# Patient Record
Sex: Male | Born: 1960 | Smoking: Never smoker
Health system: Southern US, Community
[De-identification: ages and names within clinical notes are randomized; demographics above are authoritative.]

## PROBLEM LIST (undated history)

## (undated) DIAGNOSIS — I4891 Unspecified atrial fibrillation: Secondary | ICD-10-CM

## (undated) DIAGNOSIS — I82409 Acute embolism and thrombosis of unspecified deep veins of unspecified lower extremity: Secondary | ICD-10-CM

## (undated) DIAGNOSIS — I509 Heart failure, unspecified: Secondary | ICD-10-CM

## (undated) DIAGNOSIS — F17200 Nicotine dependence, unspecified, uncomplicated: Secondary | ICD-10-CM

## (undated) DIAGNOSIS — I739 Peripheral vascular disease, unspecified: Secondary | ICD-10-CM

## (undated) DIAGNOSIS — I50810 Right heart failure, unspecified: Secondary | ICD-10-CM

## (undated) DIAGNOSIS — I10 Essential (primary) hypertension: Secondary | ICD-10-CM

## (undated) DIAGNOSIS — J449 Chronic obstructive pulmonary disease, unspecified: Secondary | ICD-10-CM

## (undated) HISTORY — PX: ORIF FEMUR FRACTURE: SHX2119

## (undated) HISTORY — PX: KNEE ARTHROSCOPY: SUR90

---

## 2001-11-25 ENCOUNTER — Encounter: Payer: Self-pay | Admitting: *Deleted

## 2001-11-25 ENCOUNTER — Emergency Department (HOSPITAL_COMMUNITY): Admission: EM | Admit: 2001-11-25 | Discharge: 2001-11-25 | Payer: Self-pay | Admitting: *Deleted

## 2003-01-06 ENCOUNTER — Other Ambulatory Visit: Payer: Self-pay

## 2005-06-03 ENCOUNTER — Other Ambulatory Visit: Payer: Self-pay

## 2005-06-04 ENCOUNTER — Inpatient Hospital Stay: Payer: Self-pay | Admitting: Internal Medicine

## 2005-07-06 ENCOUNTER — Ambulatory Visit: Payer: Self-pay | Admitting: Internal Medicine

## 2005-07-24 ENCOUNTER — Emergency Department: Payer: Self-pay | Admitting: Emergency Medicine

## 2005-08-16 ENCOUNTER — Emergency Department: Payer: Self-pay | Admitting: Unknown Physician Specialty

## 2005-08-17 ENCOUNTER — Encounter: Payer: Self-pay | Admitting: Internal Medicine

## 2005-08-31 ENCOUNTER — Encounter: Payer: Self-pay | Admitting: Internal Medicine

## 2005-10-01 ENCOUNTER — Encounter: Payer: Self-pay | Admitting: Internal Medicine

## 2005-10-31 ENCOUNTER — Encounter: Payer: Self-pay | Admitting: Internal Medicine

## 2005-12-01 ENCOUNTER — Encounter: Payer: Self-pay | Admitting: Internal Medicine

## 2006-10-16 ENCOUNTER — Other Ambulatory Visit: Payer: Self-pay

## 2006-10-16 ENCOUNTER — Emergency Department: Payer: Self-pay | Admitting: Internal Medicine

## 2007-11-21 ENCOUNTER — Ambulatory Visit: Payer: Self-pay

## 2008-07-30 ENCOUNTER — Encounter: Payer: Self-pay | Admitting: Internal Medicine

## 2008-07-31 ENCOUNTER — Observation Stay: Payer: Self-pay | Admitting: Internal Medicine

## 2008-07-31 ENCOUNTER — Ambulatory Visit: Payer: Self-pay | Admitting: Internal Medicine

## 2009-07-07 ENCOUNTER — Inpatient Hospital Stay: Payer: Self-pay | Admitting: Internal Medicine

## 2010-05-26 ENCOUNTER — Emergency Department: Payer: Self-pay | Admitting: Emergency Medicine

## 2010-06-18 NOTE — Procedures (Signed)
   NAME:  Christopher Bryan, Christopher Bryan                        ACCOUNT NO.:  [id number]   MEDICAL RECORD NO.:  [id number]                   PATIENT TYPE:  EMS   LOCATION:  ED                                   FACILITY:  [facility]   PHYSICIAN:  Edward L. Juanetta Gosling, M.D.             DATE OF BIRTH:  05-12-1960   DATE OF PROCEDURE:  DATE OF DISCHARGE:  11/25/2001                                EKG INTERPRETATION   TIME AND DATE:  1520 on 11/25/01   INTERPRETATION:  The rhythm is sinus rhythm with a bradycardic rate in the  50s.  There is left atrial enlargement.  Q waves are seen inferiorly which  may indicate previous inferior myocardial infarction.  Clinical correlation  is suggested..  There are Q waves across the precordium which may indicate a  previous anterior myocardial infarction as well.   IMPRESSION:  Abnormal electrocardiogram.                                               Edward L. Juanetta Gosling, M.D.    ELH/MEDQ  D:  11/26/2001  T:  11/27/2001  Job:  [id number]

## 2016-06-21 ENCOUNTER — Emergency Department: Payer: Non-veteran care

## 2016-06-21 ENCOUNTER — Inpatient Hospital Stay
Admission: EM | Admit: 2016-06-21 | Discharge: 2016-06-23 | DRG: 189 | Disposition: A | Discharge status: Home / Self Care | Payer: Non-veteran care | Attending: Internal Medicine | Admitting: Internal Medicine

## 2016-06-21 ENCOUNTER — Encounter: Payer: Self-pay | Admitting: Emergency Medicine

## 2016-06-21 DIAGNOSIS — R0602 Shortness of breath: Secondary | ICD-10-CM | POA: Diagnosis present

## 2016-06-21 DIAGNOSIS — E872 Acidosis: Secondary | ICD-10-CM | POA: Diagnosis present

## 2016-06-21 DIAGNOSIS — Z8249 Family history of ischemic heart disease and other diseases of the circulatory system: Secondary | ICD-10-CM

## 2016-06-21 DIAGNOSIS — G629 Polyneuropathy, unspecified: Secondary | ICD-10-CM | POA: Diagnosis present

## 2016-06-21 DIAGNOSIS — Z23 Encounter for immunization: Secondary | ICD-10-CM | POA: Diagnosis not present

## 2016-06-21 DIAGNOSIS — Z7901 Long term (current) use of anticoagulants: Secondary | ICD-10-CM

## 2016-06-21 DIAGNOSIS — I361 Nonrheumatic tricuspid (valve) insufficiency: Secondary | ICD-10-CM | POA: Diagnosis not present

## 2016-06-21 DIAGNOSIS — I50811 Acute right heart failure: Secondary | ICD-10-CM | POA: Diagnosis present

## 2016-06-21 DIAGNOSIS — J9600 Acute respiratory failure, unspecified whether with hypoxia or hypercapnia: Secondary | ICD-10-CM

## 2016-06-21 DIAGNOSIS — Z86711 Personal history of pulmonary embolism: Secondary | ICD-10-CM | POA: Diagnosis not present

## 2016-06-21 DIAGNOSIS — I5032 Chronic diastolic (congestive) heart failure: Secondary | ICD-10-CM | POA: Diagnosis present

## 2016-06-21 DIAGNOSIS — J441 Chronic obstructive pulmonary disease with (acute) exacerbation: Secondary | ICD-10-CM

## 2016-06-21 DIAGNOSIS — I509 Heart failure, unspecified: Secondary | ICD-10-CM

## 2016-06-21 DIAGNOSIS — E785 Hyperlipidemia, unspecified: Secondary | ICD-10-CM | POA: Diagnosis present

## 2016-06-21 DIAGNOSIS — J9602 Acute respiratory failure with hypercapnia: Secondary | ICD-10-CM | POA: Diagnosis present

## 2016-06-21 DIAGNOSIS — Z86718 Personal history of other venous thrombosis and embolism: Secondary | ICD-10-CM | POA: Diagnosis not present

## 2016-06-21 DIAGNOSIS — Z825 Family history of asthma and other chronic lower respiratory diseases: Secondary | ICD-10-CM | POA: Diagnosis not present

## 2016-06-21 DIAGNOSIS — I248 Other forms of acute ischemic heart disease: Secondary | ICD-10-CM | POA: Diagnosis present

## 2016-06-21 DIAGNOSIS — Z79899 Other long term (current) drug therapy: Secondary | ICD-10-CM | POA: Diagnosis not present

## 2016-06-21 DIAGNOSIS — I110 Hypertensive heart disease with heart failure: Secondary | ICD-10-CM | POA: Diagnosis present

## 2016-06-21 DIAGNOSIS — F17200 Nicotine dependence, unspecified, uncomplicated: Secondary | ICD-10-CM | POA: Diagnosis present

## 2016-06-21 DIAGNOSIS — J9601 Acute respiratory failure with hypoxia: Secondary | ICD-10-CM | POA: Diagnosis present

## 2016-06-21 DIAGNOSIS — I351 Nonrheumatic aortic (valve) insufficiency: Secondary | ICD-10-CM | POA: Diagnosis not present

## 2016-06-21 DIAGNOSIS — I5033 Acute on chronic diastolic (congestive) heart failure: Secondary | ICD-10-CM | POA: Diagnosis present

## 2016-06-21 DIAGNOSIS — I10 Essential (primary) hypertension: Secondary | ICD-10-CM

## 2016-06-21 HISTORY — DX: Essential (primary) hypertension: I10

## 2016-06-21 LAB — CBC WITH DIFFERENTIAL/PLATELET
BASOS PCT: 0 %
Basophils Absolute: 0 10*3/uL (ref 0–0.1)
Eosinophils Absolute: 0.1 10*3/uL (ref 0–0.7)
Eosinophils Relative: 1 %
HCT: 44.1 % (ref 40.0–52.0)
Hemoglobin: 14.8 g/dL (ref 13.0–18.0)
LYMPHS PCT: 23 %
Lymphs Abs: 2.9 10*3/uL (ref 1.0–3.6)
MCH: 29.5 pg (ref 26.0–34.0)
MCHC: 33.7 g/dL (ref 32.0–36.0)
MCV: 87.7 fL (ref 80.0–100.0)
MONO ABS: 0.6 10*3/uL (ref 0.2–1.0)
MONOS PCT: 5 %
NEUTROS ABS: 8.6 10*3/uL — AB (ref 1.4–6.5)
Neutrophils Relative %: 71 %
Platelets: 170 10*3/uL (ref 150–440)
RBC: 5.03 MIL/uL (ref 4.40–5.90)
RDW: 14.2 % (ref 11.5–14.5)
WBC: 12.2 10*3/uL — ABNORMAL HIGH (ref 3.8–10.6)

## 2016-06-21 LAB — COMPREHENSIVE METABOLIC PANEL
ALT: 12 U/L — ABNORMAL LOW (ref 17–63)
ANION GAP: 7 (ref 5–15)
AST: 26 U/L (ref 15–41)
Albumin: 4.6 g/dL (ref 3.5–5.0)
Alkaline Phosphatase: 92 U/L (ref 38–126)
BUN: 11 mg/dL (ref 6–20)
CO2: 27 mmol/L (ref 22–32)
Calcium: 9.3 mg/dL (ref 8.9–10.3)
Chloride: 106 mmol/L (ref 101–111)
Creatinine, Ser: 1.08 mg/dL (ref 0.61–1.24)
GFR calc Af Amer: >60 mL/min (ref >60)
Glucose, Bld: 109 mg/dL — ABNORMAL HIGH (ref 65–99)
POTASSIUM: 4.2 mmol/L (ref 3.5–5.1)
Sodium: 140 mmol/L (ref 135–145)
TOTAL PROTEIN: 8.6 g/dL — AB (ref 6.5–8.1)
Total Bilirubin: 0.7 mg/dL (ref 0.3–1.2)

## 2016-06-21 LAB — BRAIN NATRIURETIC PEPTIDE: B Natriuretic Peptide: 534 pg/mL — ABNORMAL HIGH (ref 0.0–100.0)

## 2016-06-21 LAB — BLOOD GAS, ARTERIAL
ACID-BASE DEFICIT: 4.9 mmol/L — AB (ref 0.0–2.0)
Bicarbonate: 25.7 mmol/L (ref 20.0–28.0)
DELIVERY SYSTEMS: POSITIVE
Expiratory PAP: 6
FIO2: 0.5
Inspiratory PAP: 12
MECHANICAL RATE: 8
O2 Saturation: 98.2 %
PH ART: 7.16 — AB (ref 7.350–7.450)
Patient temperature: 37
pCO2 arterial: 72 mmHg (ref 32.0–48.0)
pO2, Arterial: 132 mmHg — ABNORMAL HIGH (ref 83.0–108.0)

## 2016-06-21 LAB — TROPONIN I
Troponin I: 0.14 ng/mL (ref <0.03)
Troponin I: 0.29 ng/mL (ref <0.03)
Troponin I: 0.42 ng/mL (ref <0.03)

## 2016-06-21 LAB — PROCALCITONIN: PROCALCITONIN: 0.35 ng/mL

## 2016-06-21 LAB — MRSA PCR SCREENING: MRSA by PCR: NEGATIVE

## 2016-06-21 LAB — LACTIC ACID, PLASMA
Lactic Acid, Venous: 1.2 mmol/L (ref 0.5–1.9)
Lactic Acid, Venous: 2 mmol/L (ref 0.5–1.9)

## 2016-06-21 LAB — TSH: TSH: 0.316 u[IU]/mL — ABNORMAL LOW (ref 0.350–4.500)

## 2016-06-21 LAB — PROTIME-INR
INR: 1.31
Prothrombin Time: 16.4 seconds — ABNORMAL HIGH (ref 11.4–15.2)

## 2016-06-21 LAB — ETHANOL

## 2016-06-21 LAB — GLUCOSE, CAPILLARY: Glucose-Capillary: 93 mg/dL (ref 65–99)

## 2016-06-21 MED ORDER — TRAMADOL HCL 50 MG PO TABS
50.0000 mg | ORAL_TABLET | Freq: Two times a day (BID) | ORAL | Status: DC | PRN
  Administered 2016-06-22: 50 mg via ORAL
  Filled 2016-06-21: qty 1

## 2016-06-21 MED ORDER — ASPIRIN EC 81 MG PO TBEC
81.0000 mg | DELAYED_RELEASE_TABLET | Freq: Every day | ORAL | Status: DC
  Administered 2016-06-21 – 2016-06-23 (×3): 81 mg via ORAL
  Filled 2016-06-21 (×3): qty 1

## 2016-06-21 MED ORDER — ASPIRIN 81 MG PO CHEW
324.0000 mg | CHEWABLE_TABLET | Freq: Once | ORAL | Status: AC
  Administered 2016-06-21: 324 mg via ORAL
  Filled 2016-06-21: qty 4

## 2016-06-21 MED ORDER — ALBUTEROL SULFATE (2.5 MG/3ML) 0.083% IN NEBU: 2.5000 mg | INHALATION_SOLUTION | RESPIRATORY_TRACT | Status: DC | PRN

## 2016-06-21 MED ORDER — ORAL CARE MOUTH RINSE: 15.0000 mL | Freq: Two times a day (BID) | OROMUCOSAL | Status: DC

## 2016-06-21 MED ORDER — AZITHROMYCIN 500 MG PO TABS: 500.0000 mg | ORAL_TABLET | Freq: Every day | ORAL | Status: DC

## 2016-06-21 MED ORDER — BUDESONIDE 0.25 MG/2ML IN SUSP: 0.2500 mg | Freq: Two times a day (BID) | RESPIRATORY_TRACT | Status: DC

## 2016-06-21 MED ORDER — CARVEDILOL 12.5 MG PO TABS
12.5000 mg | ORAL_TABLET | Freq: Two times a day (BID) | ORAL | Status: DC
  Administered 2016-06-21 – 2016-06-23 (×5): 12.5 mg via ORAL
  Filled 2016-06-21 (×3): qty 1
  Filled 2016-06-21: qty 2
  Filled 2016-06-21: qty 1

## 2016-06-21 MED ORDER — WARFARIN SODIUM 5 MG PO TABS: 5.0000 mg | ORAL_TABLET | ORAL | Status: DC

## 2016-06-21 MED ORDER — MORPHINE SULFATE (PF) 2 MG/ML IV SOLN
INTRAVENOUS | Status: AC
  Administered 2016-06-21: 2 mg via INTRAVENOUS
  Filled 2016-06-21: qty 1

## 2016-06-21 MED ORDER — PREDNISONE 50 MG PO TABS
60.0000 mg | ORAL_TABLET | Freq: Every day | ORAL | Status: DC
  Administered 2016-06-22 – 2016-06-23 (×2): 60 mg via ORAL
  Filled 2016-06-21 (×2): qty 1

## 2016-06-21 MED ORDER — PREDNISONE 10 MG PO TABS: 5.0000 mg | ORAL_TABLET | Freq: Every day | ORAL | Status: DC

## 2016-06-21 MED ORDER — ENOXAPARIN SODIUM 100 MG/ML ~~LOC~~ SOLN
SUBCUTANEOUS | Status: AC
  Administered 2016-06-21: 60 mg via SUBCUTANEOUS
  Filled 2016-06-21: qty 1

## 2016-06-21 MED ORDER — LEVALBUTEROL HCL 0.63 MG/3ML IN NEBU
INHALATION_SOLUTION | RESPIRATORY_TRACT | Status: AC
  Administered 2016-06-21: 0.63 mg via RESPIRATORY_TRACT
  Filled 2016-06-21: qty 3

## 2016-06-21 MED ORDER — ALPRAZOLAM 0.25 MG PO TABS
0.2500 mg | ORAL_TABLET | Freq: Three times a day (TID) | ORAL | Status: DC | PRN
  Administered 2016-06-21 (×2): 0.25 mg via ORAL
  Filled 2016-06-21 (×2): qty 1

## 2016-06-21 MED ORDER — METHYLPREDNISOLONE SODIUM SUCC 40 MG IJ SOLR
40.0000 mg | Freq: Two times a day (BID) | INTRAMUSCULAR | Status: AC
  Administered 2016-06-21 (×2): 40 mg via INTRAVENOUS
  Filled 2016-06-21 (×2): qty 1

## 2016-06-21 MED ORDER — DOXYCYCLINE HYCLATE 100 MG PO TABS
100.0000 mg | ORAL_TABLET | Freq: Two times a day (BID) | ORAL | Status: DC
  Administered 2016-06-21 – 2016-06-23 (×5): 100 mg via ORAL
  Filled 2016-06-21 (×6): qty 1

## 2016-06-21 MED ORDER — ACETAMINOPHEN 650 MG RE SUPP: 650.0000 mg | Freq: Four times a day (QID) | RECTAL | Status: DC | PRN

## 2016-06-21 MED ORDER — FUROSEMIDE 10 MG/ML IJ SOLN
20.0000 mg | Freq: Once | INTRAMUSCULAR | Status: AC
  Administered 2016-06-21: 20 mg via INTRAVENOUS
  Filled 2016-06-21: qty 4

## 2016-06-21 MED ORDER — TRAZODONE HCL 50 MG PO TABS
25.0000 mg | ORAL_TABLET | Freq: Every day | ORAL | Status: DC
  Administered 2016-06-21 – 2016-06-22 (×2): 25 mg via ORAL
  Filled 2016-06-21 (×2): qty 1

## 2016-06-21 MED ORDER — AZITHROMYCIN 500 MG IV SOLR
500.0000 mg | INTRAVENOUS | Status: DC
  Filled 2016-06-21: qty 500

## 2016-06-21 MED ORDER — ENOXAPARIN SODIUM 60 MG/0.6ML ~~LOC~~ SOLN
1.0000 mg/kg | Freq: Once | SUBCUTANEOUS | Status: AC
  Administered 2016-06-21: 60 mg via SUBCUTANEOUS

## 2016-06-21 MED ORDER — PREDNISONE 20 MG PO TABS: 10.0000 mg | ORAL_TABLET | Freq: Every day | ORAL | Status: DC

## 2016-06-21 MED ORDER — HYDRALAZINE HCL 20 MG/ML IJ SOLN
10.0000 mg | INTRAMUSCULAR | Status: AC
  Administered 2016-06-21: 10 mg via INTRAVENOUS
  Filled 2016-06-21: qty 1

## 2016-06-21 MED ORDER — WARFARIN - PHARMACIST DOSING INPATIENT
Freq: Every day | Status: DC
  Administered 2016-06-21: 17:00:00

## 2016-06-21 MED ORDER — LISINOPRIL 10 MG PO TABS
10.0000 mg | ORAL_TABLET | Freq: Every day | ORAL | Status: DC
Start: 2016-06-21 — End: 2016-06-23
  Administered 2016-06-21 – 2016-06-23 (×3): 10 mg via ORAL
  Filled 2016-06-21 (×3): qty 1

## 2016-06-21 MED ORDER — WARFARIN SODIUM 5 MG PO TABS: 2.5000 mg | ORAL_TABLET | ORAL | Status: DC

## 2016-06-21 MED ORDER — BUDESONIDE 0.25 MG/2ML IN SUSP
0.2500 mg | Freq: Two times a day (BID) | RESPIRATORY_TRACT | Status: DC
  Administered 2016-06-21 – 2016-06-23 (×5): 0.25 mg via RESPIRATORY_TRACT
  Filled 2016-06-21 (×6): qty 2

## 2016-06-21 MED ORDER — METHYLPREDNISOLONE SODIUM SUCC 125 MG IJ SOLR
125.0000 mg | Freq: Once | INTRAMUSCULAR | Status: AC
  Administered 2016-06-21: 125 mg via INTRAVENOUS
  Filled 2016-06-21: qty 2

## 2016-06-21 MED ORDER — PREDNISONE 20 MG PO TABS: 30.0000 mg | ORAL_TABLET | Freq: Every day | ORAL | Status: DC

## 2016-06-21 MED ORDER — AZITHROMYCIN 250 MG PO TABS: 250.0000 mg | ORAL_TABLET | Freq: Every day | ORAL | Status: DC

## 2016-06-21 MED ORDER — MORPHINE SULFATE (PF) 2 MG/ML IV SOLN
2.0000 mg | INTRAVENOUS | Status: AC
  Administered 2016-06-21: 2 mg via INTRAVENOUS

## 2016-06-21 MED ORDER — HYDRALAZINE HCL 20 MG/ML IJ SOLN: 10.0000 mg | INTRAMUSCULAR | Status: DC | PRN

## 2016-06-21 MED ORDER — WARFARIN - PHARMACIST DOSING INPATIENT: Freq: Every day | Status: DC

## 2016-06-21 MED ORDER — CHLORHEXIDINE GLUCONATE 0.12 % MT SOLN: 15.0000 mL | Freq: Two times a day (BID) | OROMUCOSAL | Status: DC

## 2016-06-21 MED ORDER — IPRATROPIUM-ALBUTEROL 0.5-2.5 (3) MG/3ML IN SOLN
3.0000 mL | Freq: Four times a day (QID) | RESPIRATORY_TRACT | Status: DC
  Administered 2016-06-21 – 2016-06-23 (×10): 3 mL via RESPIRATORY_TRACT
  Filled 2016-06-21 (×10): qty 3

## 2016-06-21 MED ORDER — ENOXAPARIN SODIUM 60 MG/0.6ML ~~LOC~~ SOLN
1.0000 mg/kg | Freq: Two times a day (BID) | SUBCUTANEOUS | Status: DC
  Administered 2016-06-21 – 2016-06-22 (×2): 55 mg via SUBCUTANEOUS
  Filled 2016-06-21 (×2): qty 0.6

## 2016-06-21 MED ORDER — IPRATROPIUM-ALBUTEROL 0.5-2.5 (3) MG/3ML IN SOLN
3.0000 mL | Freq: Once | RESPIRATORY_TRACT | Status: AC
  Administered 2016-06-21: 3 mL via RESPIRATORY_TRACT
  Filled 2016-06-21: qty 3

## 2016-06-21 MED ORDER — PREDNISONE 20 MG PO TABS: 20.0000 mg | ORAL_TABLET | Freq: Every day | ORAL | Status: DC

## 2016-06-21 MED ORDER — ONDANSETRON HCL 4 MG PO TABS: 4.0000 mg | ORAL_TABLET | Freq: Four times a day (QID) | ORAL | Status: DC | PRN

## 2016-06-21 MED ORDER — NITROGLYCERIN 2 % TD OINT
1.0000 [in_us] | TOPICAL_OINTMENT | Freq: Once | TRANSDERMAL | Status: AC
  Administered 2016-06-21: 1 [in_us] via TOPICAL
  Filled 2016-06-21: qty 1

## 2016-06-21 MED ORDER — NICOTINE 21 MG/24HR TD PT24
21.0000 mg | MEDICATED_PATCH | Freq: Every day | TRANSDERMAL | Status: DC
  Administered 2016-06-21 – 2016-06-23 (×3): 21 mg via TRANSDERMAL
  Filled 2016-06-21 (×3): qty 1

## 2016-06-21 MED ORDER — LEVALBUTEROL HCL 0.63 MG/3ML IN NEBU
0.6300 mg | INHALATION_SOLUTION | Freq: Four times a day (QID) | RESPIRATORY_TRACT | Status: DC
  Administered 2016-06-21: 0.63 mg via RESPIRATORY_TRACT

## 2016-06-21 MED ORDER — PREDNISONE 20 MG PO TABS: 40.0000 mg | ORAL_TABLET | Freq: Every day | ORAL | Status: DC

## 2016-06-21 MED ORDER — ACETAMINOPHEN 325 MG PO TABS: 650.0000 mg | ORAL_TABLET | Freq: Four times a day (QID) | ORAL | Status: DC | PRN

## 2016-06-21 MED ORDER — ONDANSETRON HCL 4 MG/2ML IJ SOLN: 4.0000 mg | Freq: Four times a day (QID) | INTRAMUSCULAR | Status: DC | PRN

## 2016-06-21 MED ORDER — ATORVASTATIN CALCIUM 20 MG PO TABS
20.0000 mg | ORAL_TABLET | Freq: Every day | ORAL | Status: DC
  Administered 2016-06-21 – 2016-06-22 (×2): 20 mg via ORAL
  Filled 2016-06-21 (×2): qty 1

## 2016-06-21 MED ORDER — WARFARIN SODIUM 5 MG PO TABS
7.5000 mg | ORAL_TABLET | Freq: Once | ORAL | Status: AC
  Administered 2016-06-21: 7.5 mg via ORAL
  Filled 2016-06-21: qty 2

## 2016-06-21 MED ORDER — MORPHINE SULFATE (PF) 4 MG/ML IV SOLN
4.0000 mg | Freq: Once | INTRAVENOUS | Status: AC
  Administered 2016-06-21: 4 mg via INTRAVENOUS
  Filled 2016-06-21: qty 1

## 2016-06-21 MED ORDER — GABAPENTIN 800 MG PO TABS
400.0000 mg | ORAL_TABLET | Freq: Two times a day (BID) | ORAL | Status: DC
  Filled 2016-06-21: qty 0.5

## 2016-06-21 MED ORDER — VITAMIN D 1000 UNITS PO TABS
2000.0000 [IU] | ORAL_TABLET | Freq: Every day | ORAL | Status: DC
  Administered 2016-06-21 – 2016-06-23 (×3): 2000 [IU] via ORAL
  Filled 2016-06-21 (×3): qty 2

## 2016-06-21 MED ORDER — GABAPENTIN 300 MG PO CAPS
400.0000 mg | ORAL_CAPSULE | Freq: Two times a day (BID) | ORAL | Status: DC
  Administered 2016-06-21 – 2016-06-23 (×5): 400 mg via ORAL
  Filled 2016-06-21 (×5): qty 1

## 2016-06-21 MED ORDER — DEXTROSE 5 % IV SOLN
500.0000 mg | INTRAVENOUS | Status: DC
  Filled 2016-06-21: qty 500

## 2016-06-21 MED ORDER — DOCUSATE SODIUM 100 MG PO CAPS
100.0000 mg | ORAL_CAPSULE | Freq: Two times a day (BID) | ORAL | Status: DC
  Administered 2016-06-21 – 2016-06-23 (×5): 100 mg via ORAL
  Filled 2016-06-21 (×5): qty 1

## 2016-06-21 MED ORDER — PNEUMOCOCCAL VAC POLYVALENT 25 MCG/0.5ML IJ INJ
0.5000 mL | INJECTION | INTRAMUSCULAR | Status: AC
  Administered 2016-06-22: 0.5 mL via INTRAMUSCULAR
  Filled 2016-06-21: qty 0.5

## 2016-06-21 MED ORDER — LORATADINE 10 MG PO TABS: 10.0000 mg | ORAL_TABLET | Freq: Every day | ORAL | Status: DC | PRN

## 2016-06-21 MED ORDER — PREDNISONE 20 MG PO TABS: 50.0000 mg | ORAL_TABLET | Freq: Every day | ORAL | Status: DC

## 2016-06-21 NOTE — ED Notes (Signed)
Pt able to stand next to bed, in no distress, and use urinal independently. Pt was able to urinate 500cc

## 2016-06-21 NOTE — Progress Notes (Signed)
Dr Hilton SinclairWeiting notified that patient has troponin level of 0.42. MD acknowledged and will put orders in.

## 2016-06-21 NOTE — Consult Note (Signed)
Reason for Consult: Respiratory failure shortness of breath dyspnea Referring Physician: Dr. Tressia Miners hospitalist,   Cindee Salt. is an 56 y.o. male.  HPI: Patient is a 56 year old black male smoker with significant COPD usually goes to the [facility] in [place] has had a history of pulmonary embolus and is on anticoagulation the patient started having worsening dyspnea over the last few days he states he had difficulty catching his breath she finally came to the emergency room. Evaluation in emergency room suggested severe COPD emphysema the patient was placed on BiPAP which helped improve his respiratory failure symptoms he denies any chest pain but was found to have slightly elevated troponin troponins and was admitted for further evaluation and cardiology was then recommended for consult  Past Medical History:  Diagnosis Date  . Hypertension     Past Surgical History:  Procedure Laterality Date  . KNEE ARTHROSCOPY Left   . ORIF FEMUR FRACTURE Right     Family History  Problem Relation Age of Onset  . CAD Mother   . Emphysema Father     Social History:  reports that he has been smoking.  He has never used smokeless tobacco. He reports that he drinks alcohol. He reports that he does not use drugs.  Allergies:  Allergies  Allergen Reactions  . Ivp Dye [Iodinated Diagnostic Agents] Hives    Medications: I have reviewed the patient's current medications.  Results for orders placed or performed during the hospital encounter of 06/21/16 (from the past 48 hour(s))  CBC with Differential     Status: Abnormal   Collection Time: 06/21/16  1:37 AM  Result Value Ref Range   WBC 12.2 (H) 3.8 - 10.6 K/uL   RBC 5.03 4.40 - 5.90 MIL/uL   Hemoglobin 14.8 13.0 - 18.0 g/dL   HCT 44.1 40.0 - 52.0 %   MCV 87.7 80.0 - 100.0 fL   MCH 29.5 26.0 - 34.0 pg   MCHC 33.7 32.0 - 36.0 g/dL   RDW 14.2 11.5 - 14.5 %   Platelets 170 150 - 440 K/uL   Neutrophils Relative % 71 %   Neutro Abs  8.6 (H) 1.4 - 6.5 K/uL   Lymphocytes Relative 23 %   Lymphs Abs 2.9 1.0 - 3.6 K/uL   Monocytes Relative 5 %   Monocytes Absolute 0.6 0.2 - 1.0 K/uL   Eosinophils Relative 1 %   Eosinophils Absolute 0.1 0 - 0.7 K/uL   Basophils Relative 0 %   Basophils Absolute 0.0 0 - 0.1 K/uL  Comprehensive metabolic panel     Status: Abnormal   Collection Time: 06/21/16  1:37 AM  Result Value Ref Range   Sodium 140 135 - 145 mmol/L   Potassium 4.2 3.5 - 5.1 mmol/L   Chloride 106 101 - 111 mmol/L   CO2 27 22 - 32 mmol/L   Glucose, Bld 109 (H) 65 - 99 mg/dL   BUN 11 6 - 20 mg/dL   Creatinine, Ser 1.08 0.61 - 1.24 mg/dL   Calcium 9.3 8.9 - 10.3 mg/dL   Total Protein 8.6 (H) 6.5 - 8.1 g/dL   Albumin 4.6 3.5 - 5.0 g/dL   AST 26 15 - 41 U/L   ALT 12 (L) 17 - 63 U/L   Alkaline Phosphatase 92 38 - 126 U/L   Total Bilirubin 0.7 0.3 - 1.2 mg/dL   GFR calc non Af Amer >60 >60 mL/min   GFR calc Af Amer >60 >60 mL/min  Comment: (NOTE) The eGFR has been calculated using the CKD EPI equation. This calculation has not been validated in all clinical situations. eGFR's persistently <60 mL/min signify possible Chronic Kidney Disease.    Anion gap 7 5 - 15  Ethanol     Status: None   Collection Time: 06/21/16  1:37 AM  Result Value Ref Range   Alcohol, Ethyl (B) <5 <5 mg/dL    Comment:        LOWEST DETECTABLE LIMIT FOR SERUM ALCOHOL IS 5 mg/dL FOR MEDICAL PURPOSES ONLY   Troponin I     Status: None   Collection Time: 06/21/16  1:37 AM  Result Value Ref Range   Troponin I <0.03 <0.03 ng/mL  Brain natriuretic peptide     Status: Abnormal   Collection Time: 06/21/16  1:37 AM  Result Value Ref Range   B Natriuretic Peptide 534.0 (H) 0.0 - 100.0 pg/mL  Protime-INR     Status: Abnormal   Collection Time: 06/21/16  1:37 AM  Result Value Ref Range   Prothrombin Time 16.4 (H) 11.4 - 15.2 seconds   INR 1.31   Lactic acid, plasma     Status: None   Collection Time: 06/21/16  1:38 AM  Result Value Ref  Range   Lactic Acid, Venous 1.2 0.5 - 1.9 mmol/L  Blood gas, arterial     Status: Abnormal   Collection Time: 06/21/16  1:38 AM  Result Value Ref Range   FIO2 0.50    Delivery systems BILEVEL POSITIVE AIRWAY PRESSURE    Inspiratory PAP 12    Expiratory PAP 6.0    pH, Arterial 7.16 (LL) 7.350 - 7.450    Comment: CRITICAL RESULT CALLED TO, READ BACK BY AND VERIFIED WITH: DR.SUNG AT 0203 ON 06/21/16 KSL    pCO2 arterial 72 (HH) 32.0 - 48.0 mmHg    Comment: CRITICAL RESULT CALLED TO, READ BACK BY AND VERIFIED WITH: DR.SUNG AT 0203 ON 06/21/16 KSL    pO2, Arterial 132 (H) 83.0 - 108.0 mmHg   Bicarbonate 25.7 20.0 - 28.0 mmol/L   Acid-base deficit 4.9 (H) 0.0 - 2.0 mmol/L   O2 Saturation 98.2 %   Patient temperature 37.0    Collection site RIGHT RADIAL    Sample type ARTERIAL DRAW    Allens test (pass/fail) PASS PASS   Mechanical Rate 8   MRSA PCR Screening     Status: None   Collection Time: 06/21/16  5:49 AM  Result Value Ref Range   MRSA by PCR NEGATIVE NEGATIVE    Comment:        The GeneXpert MRSA Assay (FDA approved for NASAL specimens only), is one component of a comprehensive MRSA colonization surveillance program. It is not intended to diagnose MRSA infection nor to guide or monitor treatment for MRSA infections.   Glucose, capillary     Status: None   Collection Time: 06/21/16  5:49 AM  Result Value Ref Range   Glucose-Capillary 93 65 - 99 mg/dL  Lactic acid, plasma     Status: Abnormal   Collection Time: 06/21/16  6:23 AM  Result Value Ref Range   Lactic Acid, Venous 2.0 (HH) 0.5 - 1.9 mmol/L    Comment: CRITICAL RESULT CALLED TO, READ BACK BY AND VERIFIED WITH TESS THOMAS AT 0720 ON 06/21/16 [place].   TSH     Status: Abnormal   Collection Time: 06/21/16  6:23 AM  Result Value Ref Range   TSH 0.316 (L) 0.350 - 4.500 uIU/mL  Comment: Performed by a 3rd Generation assay with a functional sensitivity of <=0.01 uIU/mL.  Troponin I     Status: Abnormal    Collection Time: 06/21/16  6:23 AM  Result Value Ref Range   Troponin I 0.14 (HH) <0.03 ng/mL    Comment: CRITICAL RESULT CALLED TO, READ BACK BY AND VERIFIED WITH BRAD CHRISMON AT 0750 ON 06/21/16 [place].   Procalcitonin - Baseline     Status: None   Collection Time: 06/21/16  6:23 AM  Result Value Ref Range   Procalcitonin 0.35 ng/mL    Comment:        Interpretation: PCT (Procalcitonin) <= 0.5 ng/mL: Systemic infection (sepsis) is not likely. Local bacterial infection is possible. (NOTE)         ICU PCT Algorithm               Non ICU PCT Algorithm    ----------------------------     ------------------------------         PCT < 0.25 ng/mL                 PCT < 0.1 ng/mL     Stopping of antibiotics            Stopping of antibiotics       strongly encouraged.               strongly encouraged.    ----------------------------     ------------------------------       PCT level decrease by               PCT < 0.25 ng/mL       >= 80% from peak PCT       OR PCT 0.25 - 0.5 ng/mL          Stopping of antibiotics                                             encouraged.     Stopping of antibiotics           encouraged.    ----------------------------     ------------------------------       PCT level decrease by              PCT >= 0.25 ng/mL       < 80% from peak PCT        AND PCT >= 0.5 ng/mL            Continuin g antibiotics                                              encouraged.       Continuing antibiotics            encouraged.    ----------------------------     ------------------------------     PCT level increase compared          PCT > 0.5 ng/mL         with peak PCT AND          PCT >= 0.5 ng/mL             Escalation of antibiotics  strongly encouraged.      Escalation of antibiotics        strongly encouraged.   Troponin I     Status: Abnormal   Collection Time: 06/21/16 11:35 AM  Result Value Ref Range   Troponin I 0.29 (HH)  <0.03 ng/mL    Comment: CRITICAL RESULT CALLED TO, READ BACK BY AND VERIFIED WITH BRAD CHRISMON 06/21/16 1225 KLW     Dg Chest Portable 1 View  Result Date: 06/21/2016 CLINICAL DATA:  Shortness of breath. Acute onset respiratory distress this morning. EXAM: PORTABLE CHEST 1 VIEW COMPARISON:  07/07/2009 FINDINGS: Normal heart size and pulmonary vascularity. Emphysematous changes in the lungs. No airspace disease or consolidation in the lungs. No blunting of costophrenic angles. No pneumothorax. Calcification of the aorta. Mediastinal contours appear intact. IMPRESSION: Emphysematous changes in the lungs. No evidence of active pulmonary disease. Electronically Signed   By: Lucienne Capers M.D.   On: 06/21/2016 01:51    Review of Systems  Constitutional: Positive for diaphoresis, malaise/fatigue and weight loss.  HENT: Positive for congestion.   Eyes: Negative.   Respiratory: Positive for shortness of breath.   Cardiovascular: Positive for orthopnea and PND.  Gastrointestinal: Negative.   Genitourinary: Negative.   Musculoskeletal: Negative.   Skin: Negative.   Neurological: Positive for weakness.  Endo/Heme/Allergies: Negative.   Psychiatric/Behavioral: Negative.    Blood pressure (!) 164/93, pulse 99, temperature 97.9 F (36.6 C), temperature source Oral, resp. rate (!) 22, height 5' 7" (1.702 m), weight 53.9 kg (118 lb 13.3 oz), SpO2 96 %. Physical Exam  Nursing note and vitals reviewed. Constitutional: He is oriented to person, place, and time. He appears well-developed and well-nourished.  HENT:  Head: Normocephalic and atraumatic.  Eyes: Conjunctivae and EOM are normal.  Neck: Normal range of motion.  Cardiovascular: Normal rate.   Respiratory: Tachypnea noted. He has decreased breath sounds in the right upper field, the right middle field, the right lower field, the left upper field, the left middle field and the left lower field. He has wheezes in the right upper field, the  right middle field, the right lower field, the left upper field, the left middle field and the left lower field. He has rhonchi in the right upper field, the right middle field, the right lower field, the left upper field, the left middle field and the left lower field.  GI: Soft. Bowel sounds are normal.  Musculoskeletal: Normal range of motion.  Neurological: He is alert and oriented to person, place, and time. He has normal reflexes.  Skin: Skin is warm.  Psychiatric: He has a normal mood and affect.    Assessment/Plan: Respiratory failure COPD Asthma Congestion Congestive heart failure  Smoking History of pulmonary embolus Demand ischemia elevated troponins Hypertension . Plan Recommend inhalers be continued BiPAP as necessary Antibiotics intravenously breast reduction Continue steroid taper Agree with anticoagulation Consider echocardiogram for left ventricular function Troponins are probably related to demand ischemia Agree with NicoDerm for smoking cessation Advised patient to refrain from tobacco abuse Continue Lipitor for lipid management    Lynann Demetrius D Sandria Mcenroe 06/21/2016, 1:36 PM

## 2016-06-21 NOTE — Progress Notes (Signed)
CH responded to an OR for an AD. Pt was awake and alert. Fiance and two sisters are bedside. CH educated Pt on the AD. Pt and family will review before completion. CH offered prayer for healing. CH is available for follow up as needed.    06/21/16 1400  Clinical Encounter Type  Visited With Patient;Patient and family together  Visit Type Initial;Spiritual support;Critical Care  Referral From Nurse  Consult/Referral To Chaplain  Spiritual Encounters  Spiritual Needs Literature;Prayer

## 2016-06-21 NOTE — Consult Note (Signed)
Name: Christopher JosephJames A Heidt Jr. MRN: [id number] DOB: 03/06/1960    ADMISSION DATE:  06/21/2016 CONSULTATION DATE:  06/21/2016  REFERRING MD :  Dr. Sheryle Hailiamond   CHIEF COMPLAINT:  Shortness of Breath   BRIEF PATIENT DESCRIPTION:  56 yo male admitted with acute on chronic hypercapnic respiratory failure secondary to AECOPD and ?CHF  SIGNIFICANT EVENTS  05/22-Pt admitted to Stepdown Unit   STUDIES:  None   HISTORY OF PRESENT ILLNESS:   This is a 56 yo male with PMH of HTN, Remote Pulmonary Embolism on Coumadin, and Current Tobacco Abuse. [facility] with c/o shortness of breath onset of symptoms 05/21.  Per ER notes the pt also c/o productive cough he was coughing up clear sputum, low grade fever, and chest tightness.  In the ER ABG results revealed pH 7.16, PCO2 72, PO2 132, and sodium bicarb 25.7.  Pt placed on continuous Bipap in the ER due to acute hypercapnic respiratory failure.  [facility] and [facility].  PAST MEDICAL HISTORY :   has a past medical history of Hypertension.  has no past surgical history on file. Prior to Admission medications   Medication Sig Start Date End Date Taking? Authorizing Provider  acetaminophen (TYLENOL) 325 MG tablet Take 650 mg by mouth 3 (three) times daily as needed for mild pain or fever.   Yes [provider]  atorvastatin (LIPITOR) 20 MG tablet Take 20 mg by mouth at bedtime.   Yes [provider]  carvedilol (COREG) 12.5 MG tablet Take 12.5 mg by mouth 2 (two) times daily with a meal.   Yes [provider]  cholecalciferol (VITAMIN D) 1000 units tablet Take 2,000 Units by mouth daily.   Yes [provider]  gabapentin (NEURONTIN) 800 MG tablet Take 400 mg by mouth 2 (two) times daily.   Yes [provider]  lisinopril (PRINIVIL,ZESTRIL) 10 MG tablet Take 10 mg by mouth daily.   Yes [provider]  loratadine (CLARITIN) 10 MG  tablet Take 10 mg by mouth daily as needed for allergies.   Yes [provider]  traMADol (ULTRAM) 50 MG tablet Take 50 mg by mouth 2 (two) times daily as needed for moderate pain.   Yes [provider]  traZODone (DESYREL) 50 MG tablet Take 25 mg by mouth at bedtime.   Yes [provider]  warfarin (COUMADIN) 5 MG tablet Take 2.5-5 mg by mouth daily. Take 2.5 mg on Wednesday and Friday. Take 5 mg all other days.   Yes [provider]   Allergies  Allergen Reactions  . Ivp Dye [Iodinated Diagnostic Agents] Hives    FAMILY HISTORY:  family history is not on file. SOCIAL HISTORY:  reports that he has been smoking.  He has never used smokeless tobacco. He reports that he drinks alcohol. He reports that he does not use drugs.  REVIEW OF SYSTEMS:   Unable to assess pt severely short of breath on Bipap  SUBJECTIVE:  Unable to assess pt severely short of breath on Bipap  VITAL SIGNS: Temp:  [97.2 F (36.2 C)-98.6 F (37 C)] 98.6 F (37 C) (05/22 0555) Pulse Rate:  [90-124] 106 (05/22 0612) Resp:  [19-30] 23 (05/22 0612) BP: (122-203)/(75-109) 162/97 (05/22 0612) SpO2:  [99 %-100 %] 100 % (05/22 0623) FiO2 (%):  [40 %-50 %] 40 % (05/22 0623) Weight:  [53.9 kg (118 lb 13.3 oz)-60.8 kg (134 lb 0.6 oz)] 53.9  kg (118 lb 13.3 oz) (05/22 0555)  PHYSICAL EXAMINATION: General: well developed, well nourished AA male in severe respiratory distress  Neuro: alert and oriented, follows commands HEENT: supple, JVD present  Cardiovascular: sinus tach, s1s2, no M/R/G Lungs: inspiratory and expiratory wheezes and diminished throughout, labored, tachypneic on Bipap Abdomen: +BS x4, soft, non tender, non distended  Musculoskeletal: normal bulk and tone, no edema Skin: intact no rashes or lesions   Recent Labs Lab 06/21/16 0137  NA 140  K 4.2  CL 106  CO2 27  BUN 11  CREATININE 1.08  GLUCOSE 109*    Recent Labs Lab 06/21/16 0137  HGB 14.8  HCT 44.1    WBC 12.2*  PLT 170   Dg Chest Portable 1 View  Result Date: 06/21/2016 CLINICAL DATA:  Shortness of breath. Acute onset respiratory distress this morning. EXAM: PORTABLE CHEST 1 VIEW COMPARISON:  07/07/2009 FINDINGS: Normal heart size and pulmonary vascularity. Emphysematous changes in the lungs. No airspace disease or consolidation in the lungs. No blunting of costophrenic angles. No pneumothorax. Calcification of the aorta. Mediastinal contours appear intact. IMPRESSION: Emphysematous changes in the lungs. No evidence of active pulmonary disease. Electronically Signed   By: Burman Nieves M.D.   On: 06/21/2016 01:51    ASSESSMENT / PLAN: Acute hypercapnic respiratory failure secondary to AECOPD and ?CHF Hypertension  Hx: Current Tobacco Abuse and Remote Pulmonary Embolism  P: Continuous Bipap for now wean as tolerated  HIGH RISK FOR INTUBATION  Maintain O2 sats 88% to 92% Scheduled bronchodilator therapy IV steroids  Repeat CXR in am  Start abx Trend WBC and monitor fever curve Trend PCT  Coumadin for VTE prophylaxis dosing per pharmacy Follow PT/INR Trend CBC Monitor for s/sx of bleeding Transfuse for hgb <7 Continue outpatient carvedilol, aspirin, and lipitor Continuous telemetry monitoring Trend troponin's  Pt will need outpatient PFT's once discharged home  Smoking Cessation counseling provided   Sonda Rumble, AGNP  Pulmonary/Critical Care Pager [phone] (please enter 7 digits) PCCM Consult Pager [phone] (please enter 7 digits)   PCCM ATTENDING ATTESTATION:  I have evaluated patient with the APP [place], reviewed database in its entirety and discussed care plan in detail. In addition, this patient was discussed on multidisciplinary rounds.   Important exam findings: No respiratory distress on  O2 HEENT WNL No JVD Distant BS, no wheezes Reg, no M NABS No edema  CXR: relative CM, hyperinflation c/w emphysema, no acute infiltrates  Major  problems addressed by PCCM team: Smoker AECOPD Significant asthmatic component as indicated by rapid improvement with bronchodilators   PLAN/REC: OK to transfer to M-S unit DC PRN BiPAP Cont supplemental O2 to maintain  Cont systemic steroids - can probably be transitioned to prednisone in AM 05/23 Cont nebulized steroids and bronchodilators - can probably be transitioned to ICS/LABA and PRN albuterol in AM 05/23 Change empiric antibiotics to doxycycline. Complete 5 days  As quickly as he has improved thus far, I suspect that he might be ready for discharge in AM 05/23. Would discharge on ICS/LABA (Symbicort, Dulera, Advair, Breo are all options) and PRN albuterol MDI  He should not require nebulizer after discharge  I have counseled re: smoking cessation  I have scheduled to see him in follow up in 4-6 weeks in the office   Billy Fischer, MD PCCM service Mobile [phone] Pager [phone] 06/21/2016 11:36 AM

## 2016-06-21 NOTE — Progress Notes (Signed)
ANTICOAGULATION CONSULT NOTE - Initial Consult  Pharmacy Consult for warfarin Indication: history of PE  Allergies  Allergen Reactions  . Ivp Dye [Iodinated Diagnostic Agents] Hives    Patient Measurements: Height: 5\' 7"  (170.2 cm) Weight: 118 lb 13.3 oz (53.9 kg) IBW/kg (Calculated) : 66.1 Heparin Dosing Weight:   Vital Signs: Temp: 98.6 F (37 C) (05/22 0555) Temp Source: Oral (05/22 0555) BP: 199/108 (05/22 0600) Pulse Rate: 124 (05/22 0600)  Labs:  Recent Labs  06/21/16 0137  HGB 14.8  HCT 44.1  PLT 170  LABPROT 16.4*  INR 1.31  CREATININE 1.08  TROPONINI <0.03    Estimated Creatinine Clearance: 56.8 mL/min (by C-G formula based on SCr of 1.08 mg/dL).   Medical History: Past Medical History:  Diagnosis Date  . Hypertension     Medications:  Scheduled:  . aspirin EC  81 mg Oral Daily  . atorvastatin  20 mg Oral QHS  . azithromycin  500 mg Oral Daily   Followed by  . [START ON 06/22/2016] azithromycin  250 mg Oral Daily  . budesonide (PULMICORT) nebulizer solution  0.25 mg Nebulization BID  . carvedilol  12.5 mg Oral BID WC  . chlorhexidine  15 mL Mouth Rinse BID  . cholecalciferol  2,000 Units Oral Daily  . docusate sodium  100 mg Oral BID  . gabapentin  400 mg Oral BID  . hydrALAZINE  10 mg Intravenous STAT  . levalbuterol  0.63 mg Nebulization Q6H  . lisinopril  10 mg Oral Daily  . mouth rinse  15 mL Mouth Rinse q12n4p  . methylPREDNISolone (SOLU-MEDROL) injection  40 mg Intravenous Q12H  . traZODone  25 mg Oral QHS  . [START ON 06/22/2016] warfarin  2.5 mg Oral Q Wed-1800   And  . [START ON 06/24/2016] warfarin  2.5 mg Oral Q Fri-1800  . warfarin  5 mg Oral Q T,Th,S,Su-1800   And  . [START ON 06/27/2016] warfarin  5 mg Oral Q Mon-1800    Assessment: Patient admitted for SOB and is anticoagulated w/ warfarin for history of PE. Home dose:  Wednesday & Friday: warfarin 2.5 mg All other days: warfarin 5 mg  5/22 INR 1.31  Goal of  Therapy:  INR 2-3 Monitor platelets by anticoagulation protocol: Yes   Plan:  Will restart pt's home warfarin dose as described above. Will monitor daily INRs and CBC  Thank you for this consult.  Thomasene Rippleavid Bridger Pizzi, PharmD, BCPS Clinical Pharmacist 06/21/2016

## 2016-06-21 NOTE — Progress Notes (Signed)
Report called to Dedra, RN on 2-A. Patient transported to room 255 via wheelchair on O2 @ 4 lpm..

## 2016-06-21 NOTE — Progress Notes (Signed)
ANTICOAGULATION CONSULT NOTE   Pharmacy Consult for warfarin Indication: history of PE   Pharmacy consulted for warfarin management for 56 yo male with history of PE. Patient takes warfarin 5mg  Sunday/Monday/Tuesday/Thursday/Saturday and 2.5mg  on Wednesdays and Fridays. Patient's INR subtherapeutic on admission. Patient currenty ordered enoxaparin 60 mg SQ BID.  Goal of Therapy:  INR 2-3 Monitor platelets by anticoagulation protocol: Yes   Plan:  Will order warfarin 7.5mg  PO x 1. Will recheck INR with am labs. Recommend bridging until INR > 2 on consecutive days.   Allergies  Allergen Reactions  . Ivp Dye [Iodinated Diagnostic Agents] Hives    Patient Measurements: Height: 5\' 7"  (170.2 cm) Weight: 118 lb 13.3 oz (53.9 kg) IBW/kg (Calculated) : 66.1 Heparin Dosing Weight:   Vital Signs: Temp: 97.9 F (36.6 C) (05/22 0800) Temp Source: Oral (05/22 0800) BP: 164/93 (05/22 1200) Pulse Rate: 99 (05/22 1200)  Labs:  Recent Labs  06/21/16 0137 06/21/16 0623 06/21/16 1135  HGB 14.8  --   --   HCT 44.1  --   --   PLT 170  --   --   LABPROT 16.4*  --   --   INR 1.31  --   --   CREATININE 1.08  --   --   TROPONINI <0.03 0.14* 0.29*    Estimated Creatinine Clearance: 56.8 mL/min (by C-G formula based on SCr of 1.08 mg/dL).   Medical History: Past Medical History:  Diagnosis Date  . Hypertension     Medications:  Scheduled:  . aspirin EC  81 mg Oral Daily  . atorvastatin  20 mg Oral QHS  . budesonide (PULMICORT) nebulizer solution  0.25 mg Nebulization BID  . carvedilol  12.5 mg Oral BID WC  . cholecalciferol  2,000 Units Oral Daily  . docusate sodium  100 mg Oral BID  . doxycycline  100 mg Oral Q12H  . gabapentin  400 mg Oral BID  . ipratropium-albuterol  3 mL Nebulization Q6H  . lisinopril  10 mg Oral Daily  . methylPREDNISolone (SOLU-MEDROL) injection  40 mg Intravenous Q12H  . nicotine  21 mg Transdermal Daily  . [START ON 06/22/2016] pneumococcal 23  valent vaccine  0.5 mL Intramuscular Tomorrow-1000  . [START ON 06/22/2016] predniSONE  60 mg Oral Q breakfast  . traZODone  25 mg Oral QHS  . warfarin  7.5 mg Oral ONCE-1800  . Warfarin - Pharmacist Dosing Inpatient   Does not apply [phone]    Pharmacy will continue to monitor and adjust per consult.   MLS 06/21/2016

## 2016-06-21 NOTE — Care Management (Addendum)
RNCM [facility]. [place] and this RNCM has faxed notes. RNCM to fax discharge summary when available. Copy of [facility] form placed on chart. O2 acute. [facility]. Patient has [facility] Choice. Patient also has Medicaid with PCP listed at [facility]. [facility] [place] however patient states his PCP is with [facility] and may not be aware that Medicaid has assigned this provider.

## 2016-06-21 NOTE — ED Triage Notes (Signed)
Pt reports feeling short of breath all day but progressively got worse until he felt like he couldn't breath at all. EMS left without giving report to RN, unaware of initial vital signs. Upon entering room techs were assisting pt to the use of a urinal. Pt placed on non rebreather by RN at 0133 due to low oxygen reading while on a nasal canula. Pt's oxygen saturation increased to 94% on non rebreather. Pt's breathing labored, respiratory called.

## 2016-06-21 NOTE — Progress Notes (Signed)
[facility] [place] [facility]   PATIENT NAME: Christopher Bryan    MR#:  [id number]  DATE OF BIRTH:  January 01, 1961  SUBJECTIVE:  CHIEF COMPLAINT:   Chief Complaint  Patient presents with  . Shortness of Breath   - admitted with acute hypoxic, hypercarbic resp failure - off bipap now, feels better, still has some dyspnea at rest  REVIEW OF SYSTEMS:  Review of Systems  Constitutional: Positive for malaise/fatigue. Negative for chills and fever.  HENT: Positive for congestion. Negative for ear discharge, hearing loss and sore throat.   Eyes: Negative for blurred vision and double vision.  Respiratory: Positive for cough and shortness of breath. Negative for wheezing.   Cardiovascular: Negative for chest pain, palpitations and leg swelling.  Gastrointestinal: Negative for abdominal pain, constipation, diarrhea, nausea and vomiting.  Genitourinary: Negative for dysuria and urgency.  Musculoskeletal: Negative for myalgias.  Neurological: Negative for dizziness, speech change, focal weakness, seizures and headaches.  Psychiatric/Behavioral: Negative for depression.    DRUG ALLERGIES:   Allergies  Allergen Reactions  . Ivp Dye [Iodinated Diagnostic Agents] Hives    VITALS:  Blood pressure (!) 164/93, pulse 99, temperature 97.9 F (36.6 C), temperature source Oral, resp. rate (!) 22, height 5\' 7"  (1.702 m), weight 53.9 kg (118 lb 13.3 oz), SpO2 96 %.  PHYSICAL EXAMINATION:  Physical Exam  GENERAL:  56 y.o.-year-old patient lying in the bed with no acute distress.  EYES: Pupils equal, round, reactive to light and accommodation. No scleral icterus. Extraocular muscles intact.  HEENT: Head atraumatic, normocephalic. Oropharynx and nasopharynx clear.  NECK:  Supple, no jugular venous distention. No thyroid enlargement, no tenderness.  LUNGS: diminished breath sounds bilaterally, no wheezing, rales,rhonchi or crepitation. No use of accessory muscles of  respiration.  Hyperinflated lungs on CXR, pigeon chest with protrusion noted. CARDIOVASCULAR: S1, S2 normal. No murmurs, rubs, or gallops.  ABDOMEN: Soft, nontender, nondistended. Bowel sounds present. No organomegaly or mass.  EXTREMITIES: No pedal edema, cyanosis, or clubbing.  NEUROLOGIC: Cranial nerves II through XII are intact. Muscle strength 5/5 in all extremities. Sensation intact. Gait not checked.  PSYCHIATRIC: The patient is alert and oriented x 3.  SKIN: No obvious rash, lesion, or ulcer.    LABORATORY PANEL:   CBC  Recent Labs Lab 06/21/16 0137  WBC 12.2*  HGB 14.8  HCT 44.1  PLT 170   ------------------------------------------------------------------------------------------------------------------  Chemistries   Recent Labs Lab 06/21/16 0137  NA 140  K 4.2  CL 106  CO2 27  GLUCOSE 109*  BUN 11  CREATININE 1.08  CALCIUM 9.3  AST 26  ALT 12*  ALKPHOS 92  BILITOT 0.7   ------------------------------------------------------------------------------------------------------------------  Cardiac Enzymes  Recent Labs Lab 06/21/16 1135  TROPONINI 0.29*   ------------------------------------------------------------------------------------------------------------------  RADIOLOGY:  Dg Chest Portable 1 View  Result Date: 06/21/2016 CLINICAL DATA:  Shortness of breath. Acute onset respiratory distress this morning. EXAM: PORTABLE CHEST 1 VIEW COMPARISON:  07/07/2009 FINDINGS: Normal heart size and pulmonary vascularity. Emphysematous changes in the lungs. No airspace disease or consolidation in the lungs. No blunting of costophrenic angles. No pneumothorax. Calcification of the aorta. Mediastinal contours appear intact. IMPRESSION: Emphysematous changes in the lungs. No evidence of active pulmonary disease. Electronically Signed   By: Burman Nieves M.D.   On: 06/21/2016 01:51    EKG:   Orders placed or performed during the hospital encounter of 06/21/16   . EKG 12-Lead  . EKG 12-Lead    ASSESSMENT AND PLAN:  56 year old male with past medical history significant for hypertension, COPD not on home oxygen, history of pulmonary embolism on Coumadin presents to hospital secondary to shortness of breath and noted to have acute respiratory failure.  #1 acute hypoxic hypercapnic respiratory failure-secondary to COPD exacerbation with underlying asthmatic bronchitis. - appreciate pulmonary consult. Patient went to ICU initially and was placed on BiPAP. -Currently on nasal cannula and wean oxygen as tolerated. -Continue IV steroids for today, also on bronchodilators and will be transitioned to home inhalers. -Continue empiric doxycycline.  #2 chronic congestive heart failure-unknown ejection fraction. No fluid overload on exam today. -Follow up echocardiogram. -Continue aspirin, statin, Coreg at this time.  #3 neuropathy-on gabapentin  #4 hypertension-on Coreg, lisinopril. Use IV hydralazine as needed for hypertensive urgency.  #5 tobacco use disorder-strongly counseled against smoking  #6 DVT prophylaxis-on Coumadin, INR sub therapeutic     All the records are reviewed and case discussed with Care Management/Social Workerr. Management plans discussed with the patient, family and they are in agreement.  CODE STATUS: Full Code  TOTAL TIME TAKING CARE OF THIS PATIENT: 38 minutes.   POSSIBLE D/C IN 1-2 DAYS, DEPENDING ON CLINICAL CONDITION.   Enid BaasKALISETTI,Christopher Bryan M.D on 06/21/2016 at 1:45 PM  Between 7am to 6pm - Pager - [phone]  After 6pm go to [email or web address] - password [email or web address]  [facility]  Office  [phone]  CC: Primary care physician; Patient, No Pcp Per

## 2016-06-21 NOTE — Progress Notes (Signed)
ANTICOAGULATION CONSULT NOTE  Pharmacy Consult for enoxaparin dosing Indication: ACS/NSTEMI/CP  Allergies  Allergen Reactions  . Ivp Dye [Iodinated Diagnostic Agents] Hives    Patient Measurements: Height: 5\' 7"  (170.2 cm) Weight: 118 lb 13.3 oz (53.9 kg) IBW/kg (Calculated) : 66.1  Vital Signs: Temp: 98.4 F (36.9 C) (05/22 1536) Temp Source: Oral (05/22 1536) BP: 176/87 (05/22 1536) Pulse Rate: 97 (05/22 1536)  Labs:  Recent Labs  06/21/16 0137 06/21/16 0623 06/21/16 1135 06/21/16 1757  HGB 14.8  --   --   --   HCT 44.1  --   --   --   PLT 170  --   --   --   LABPROT 16.4*  --   --   --   INR 1.31  --   --   --   CREATININE 1.08  --   --   --   TROPONINI <0.03 0.14* 0.29* 0.42*    Estimated Creatinine Clearance: 56.8 mL/min (by C-G formula based on SCr of 1.08 mg/dL).   Medical History: Past Medical History:  Diagnosis Date  . Hypertension     Medications:  Scheduled:  . aspirin EC  81 mg Oral Daily  . atorvastatin  20 mg Oral QHS  . budesonide (PULMICORT) nebulizer solution  0.25 mg Nebulization BID  . carvedilol  12.5 mg Oral BID WC  . cholecalciferol  2,000 Units Oral Daily  . docusate sodium  100 mg Oral BID  . doxycycline  100 mg Oral Q12H  . enoxaparin (LOVENOX) injection  1 mg/kg Subcutaneous Q12H  . gabapentin  400 mg Oral BID  . ipratropium-albuterol  3 mL Nebulization Q6H  . lisinopril  10 mg Oral Daily  . nicotine  21 mg Transdermal Daily  . [START ON 06/22/2016] pneumococcal 23 valent vaccine  0.5 mL Intramuscular Tomorrow-1000  . [START ON 06/22/2016] predniSONE  60 mg Oral Q breakfast  . traZODone  25 mg Oral QHS  . Warfarin - Pharmacist Dosing Inpatient   Does not apply q1800   Infusions:    Assessment: Pharmacy consulted to dose enoxaparin in this [age] yoM with CP/NSTEMI/PE. Patient received one time dose of enoxaparin 60 mg [place] 5/22 @ 0400. Patient also receiving warfarin for history of PE with current INR of 1.31.  Goal of  Therapy:  INR 2-3 Monitor platelets by anticoagulation protocol: Yes   Plan:  Will continue with current warfarin plan and begin enoxaparin 1mg /kg q 12 hours  Horris LatinoHolly Gilliam, PharmD Pharmacy Resident 06/21/2016 7:09 PM

## 2016-06-21 NOTE — ED Provider Notes (Signed)
[facility] Emergency Department Provider Note   ____________________________________________   None    (approximate)  I have reviewed the triage vital signs and the nursing notes.   HISTORY  Chief Complaint Shortness of Breath    HPI Christopher Chelf. is a 56 y.o. male brought to the ED from home via EMS with a chief complaint of shortness of breath. Patient has a history of hypertension, remote history of pulmonary embolus on Coumadin, who complains of progressive shortness of breath 1 day. Symptoms associated with cough productive of clear sputum, low-grade fever, chest tightness. Denies associated hemoptysis, abdominal pain, nausea, vomiting, diarrhea. Denies recent travel or trauma. Nothing makes his symptoms better. Exertion makes his symptoms worse.   Past Medical History:  Diagnosis Date  . Hypertension     There are no active problems to display for this patient.   No past surgical history on file.  Prior to Admission medications   Medication Sig Start Date End Date Taking? Authorizing Provider  acetaminophen (TYLENOL) 325 MG tablet Take 650 mg by mouth 3 (three) times daily as needed for mild pain or fever.   Yes [provider]  atorvastatin (LIPITOR) 20 MG tablet Take 20 mg by mouth at bedtime.   Yes [provider]  carvedilol (COREG) 12.5 MG tablet Take 12.5 mg by mouth 2 (two) times daily with a meal.   Yes [provider]  cholecalciferol (VITAMIN D) 1000 units tablet Take 2,000 Units by mouth daily.   Yes [provider]  gabapentin (NEURONTIN) 800 MG tablet Take 400 mg by mouth 2 (two) times daily.   Yes [provider]  lisinopril (PRINIVIL,ZESTRIL) 10 MG tablet Take 10 mg by mouth daily.   Yes [provider]  loratadine (CLARITIN) 10 MG tablet Take 10 mg by mouth daily as needed for allergies.   Yes [provider]  traMADol (ULTRAM) 50 MG tablet Take 50 mg by  mouth 2 (two) times daily as needed for moderate pain.   Yes [provider]  traZODone (DESYREL) 50 MG tablet Take 25 mg by mouth at bedtime.   Yes [provider]  warfarin (COUMADIN) 5 MG tablet Take 2.5-5 mg by mouth daily. Take 2.5 mg on Wednesday and Friday. Take 5 mg all other days.   Yes [provider]    Allergies Ivp dye [iodinated diagnostic agents]  No family history on file.  Social History Social History  Substance Use Topics  . Smoking status: Current Some Day Smoker  . Smokeless tobacco: Never Used  . Alcohol use Yes    Review of Systems  Constitutional: Positive for low-grade fever. Eyes: No visual changes. ENT: No sore throat. Cardiovascular: Positive for chest tightness. Respiratory: Positive for cough and shortness of breath. Gastrointestinal: No abdominal pain.  No nausea, no vomiting.  No diarrhea.  No constipation. Genitourinary: Negative for dysuria. Musculoskeletal: Negative for back pain. Skin: Negative for rash. Neurological: Negative for headaches, focal weakness or numbness.   ____________________________________________   PHYSICAL EXAM:  VITAL SIGNS: ED Triage Vitals [06/21/16 0133]  Enc Vitals Group     BP      Pulse      Resp      Temp      Temp src      SpO2      Weight 134 lb 0.6 oz (60.8 kg)     Height 5\' 7"  (1.702 m)     Head Circumference  Peak Flow      Pain Score 4     Pain Loc      Pain Edu?      Excl. in GC?     Constitutional: Alert and oriented. Well appearing and in moderate acute distress. Eyes: Conjunctivae are normal. PERRL. EOMI. Head: Atraumatic. Nose: No congestion/rhinnorhea. Mouth/Throat: Mucous membranes are moist.  Oropharynx non-erythematous. Neck: No stridor.   Cardiovascular: Tachycardic rate, regular rhythm. Grossly normal heart sounds.  Good peripheral circulation. Respiratory: Increased respiratory effort.  No retractions. Lungs with diffuse  rales. Gastrointestinal: Soft and nontender. No distention. No abdominal bruits. No CVA tenderness. Musculoskeletal: No lower extremity tenderness. 1+ BLE nonpittingedema.  No joint effusions. Neurologic:  Normal speech and language. No gross focal neurologic deficits are appreciated.  Skin:  Skin is warm, diaphoretic and intact. No rash noted. Psychiatric: Mood and affect are normal. Speech and behavior are normal.  ____________________________________________   LABS (all labs ordered are listed, but only abnormal results are displayed)  Labs Reviewed  CBC WITH DIFFERENTIAL/PLATELET - Abnormal; Notable for the following:       Result Value   WBC 12.2 (*)    Neutro Abs 8.6 (*)    All other components within normal limits  COMPREHENSIVE METABOLIC PANEL - Abnormal; Notable for the following:    Glucose, Bld 109 (*)    Total Protein 8.6 (*)    ALT 12 (*)    All other components within normal limits  BLOOD GAS, ARTERIAL - Abnormal; Notable for the following:    pH, Arterial 7.16 (*)    pCO2 arterial 72 (*)    pO2, Arterial 132 (*)    Acid-base deficit 4.9 (*)    All other components within normal limits  BRAIN NATRIURETIC PEPTIDE - Abnormal; Notable for the following:    B Natriuretic Peptide 534.0 (*)    All other components within normal limits  PROTIME-INR - Abnormal; Notable for the following:    Prothrombin Time 16.4 (*)    All other components within normal limits  ETHANOL  TROPONIN I  LACTIC ACID, PLASMA  LACTIC ACID, PLASMA   ____________________________________________  EKG  ED ECG REPORT I, Krystopher Kuenzel J, the attending physician, personally viewed and interpreted this ECG.   Date: 06/21/2016  EKG Time: 0131  Rate: 123  Rhythm: sinus tachycardia  Axis: LAD  Intervals:none  ST&T Change: Nonspecific  ____________________________________________  RADIOLOGY  Portable chest x-ray (viewed by me, interpreted per Dr. Andria MeuseStevens): Emphysematous changes in the lungs.  No evidence of active pulmonary  disease.   ____________________________________________   PROCEDURES  Procedure(s) performed: None  Procedures  Critical Care performed: Yes, see critical care note(s)   CRITICAL CARE Performed by: Irean HongSUNG,Jatavious Peppard J   Total critical care time: 45 minutes  Critical care time was exclusive of separately billable procedures and treating other patients.  Critical care was necessary to treat or prevent imminent or life-threatening deterioration.  Critical care was time spent personally by me on the following activities: development of treatment plan with patient and/or surrogate as well as nursing, discussions with consultants, evaluation of patient's response to treatment, examination of patient, obtaining history from patient or surrogate, ordering and performing treatments and interventions, ordering and review of laboratory studies, ordering and review of radiographic studies, pulse oximetry and re-evaluation of patient's condition.  ____________________________________________   INITIAL IMPRESSION / ASSESSMENT AND PLAN / ED COURSE  Pertinent labs & imaging results that were available during my care of the patient were reviewed by me  and considered in my medical decision making (see chart for details).  56 year old male who presents with moderate respiratory distress. History of PE on Coumadin. Low-grade temperature, diaphoretic, hypertensive with rales heard on exam. Suspect CHF. Will administer Lasix, aspirin, nitroglycerin paste and morphine. Will obtain ABG and likely place patient on BiPAP.  Clinical Course as of Jun 22 526  Tue Jun 21, 2016  0213 Patient was started on BiPAP and looks more comfortable. He is less diaphoretic. Now hearing some wheezing. Will give nebulizer treatment.  [JS]  [id number] Sleeping; appears more comfortable. Blood pressure and heart rate improving. Will discuss with hospitalist to evaluate patient in the emergency  department for admission.  [JS]  [id number] Will bridge patient with Lovenox until his INR is therapeutic.  [JS]    Clinical Course User Index [JS] Irean Hong, MD     ____________________________________________   FINAL CLINICAL IMPRESSION(S) / ED DIAGNOSES  Final diagnoses:  SOB (shortness of breath)  Essential hypertension  COPD exacerbation (HCC)  Congestive heart failure, unspecified HF chronicity, unspecified heart failure type (HCC)  Acute respiratory failure with hypercapnia (HCC)      NEW MEDICATIONS STARTED DURING THIS VISIT:  New Prescriptions   No medications on file     Note:  This document was prepared using Dragon voice recognition software and may include unintentional dictation errors.    Irean Hong, MD 06/21/16 [phone]

## 2016-06-21 NOTE — Progress Notes (Signed)
Transported pt to ICU on Bipap without incident. Pt remains on Bipap and is tol well.

## 2016-06-21 NOTE — H&P (Signed)
Christopher Bryan. is an 56 y.o. male.   Chief Complaint: Shortness of breath HPI: The patient with past medical history of hypertension and tobacco abuse presents to the emergency department complaining of shortness of breath. He states that his dyspnea became progressively worse over the last 24-36 hours. He states he awoke the night prior to admission with cough. He has had paroxysmal cough so strong that he is had some nonbloody nonbilious posttussive emesis. Since that time he admits to subcostal pain particularly when he breathes deeply. Notably the patient's history is significant for pulmonary embolism for which he is on warfarin. Upon arrival to the emergency department the patient reportedly had rales on physical exam. He received Lasix. Blood pressure was also significantly elevated and hydralazine was administered in addition to nitroglycerin paste that was placed on his chest. He denies chest pain throughout this episode. Once she was diuresed the patient continued to have respiratory distress which prompted initiation of BiPAP after which the hospitalist service was called for further management.  Past Medical History:  Diagnosis Date  . Hypertension     Past Surgical History:  Procedure Laterality Date  . KNEE ARTHROSCOPY Left   . ORIF FEMUR FRACTURE Right     Family History  Problem Relation Age of Onset  . CAD Mother   . Emphysema Father    Social History:  reports that he has been smoking.  He has never used smokeless tobacco. He reports that he drinks alcohol. He reports that he does not use drugs.  Allergies:  Allergies  Allergen Reactions  . Ivp Dye [Iodinated Diagnostic Agents] Hives    Medications Prior to Admission  Medication Sig Dispense Refill  . acetaminophen (TYLENOL) 325 MG tablet Take 650 mg by mouth 3 (three) times daily as needed for mild pain or fever.    Marland Kitchen atorvastatin (LIPITOR) 20 MG tablet Take 20 mg by mouth at bedtime.    . carvedilol (COREG)  12.5 MG tablet Take 12.5 mg by mouth 2 (two) times daily with a meal.    . cholecalciferol (VITAMIN D) 1000 units tablet Take 2,000 Units by mouth daily.    Marland Kitchen gabapentin (NEURONTIN) 800 MG tablet Take 400 mg by mouth 2 (two) times daily.    Marland Kitchen lisinopril (PRINIVIL,ZESTRIL) 10 MG tablet Take 10 mg by mouth daily.    Marland Kitchen loratadine (CLARITIN) 10 MG tablet Take 10 mg by mouth daily as needed for allergies.    Marland Kitchen traMADol (ULTRAM) 50 MG tablet Take 50 mg by mouth 2 (two) times daily as needed for moderate pain.    . traZODone (DESYREL) 50 MG tablet Take 25 mg by mouth at bedtime.    Marland Kitchen warfarin (COUMADIN) 5 MG tablet Take 2.5-5 mg by mouth daily. Take 2.5 mg on Wednesday and Friday. Take 5 mg all other days.      Results for orders placed or performed during the hospital encounter of 06/21/16 (from the past 48 hour(s))  CBC with Differential     Status: Abnormal   Collection Time: 06/21/16  1:37 AM  Result Value Ref Range   WBC 12.2 (H) 3.8 - 10.6 K/uL   RBC 5.03 4.40 - 5.90 MIL/uL   Hemoglobin 14.8 13.0 - 18.0 g/dL   HCT 44.1 40.0 - 52.0 %   MCV 87.7 80.0 - 100.0 fL   MCH 29.5 26.0 - 34.0 pg   MCHC 33.7 32.0 - 36.0 g/dL   RDW 14.2 11.5 - 14.5 %   Platelets  170 150 - 440 K/uL   Neutrophils Relative % 71 %   Neutro Abs 8.6 (H) 1.4 - 6.5 K/uL   Lymphocytes Relative 23 %   Lymphs Abs 2.9 1.0 - 3.6 K/uL   Monocytes Relative 5 %   Monocytes Absolute 0.6 0.2 - 1.0 K/uL   Eosinophils Relative 1 %   Eosinophils Absolute 0.1 0 - 0.7 K/uL   Basophils Relative 0 %   Basophils Absolute 0.0 0 - 0.1 K/uL  Comprehensive metabolic panel     Status: Abnormal   Collection Time: 06/21/16  1:37 AM  Result Value Ref Range   Sodium 140 135 - 145 mmol/L   Potassium 4.2 3.5 - 5.1 mmol/L   Chloride 106 101 - 111 mmol/L   CO2 27 22 - 32 mmol/L   Glucose, Bld 109 (H) 65 - 99 mg/dL   BUN 11 6 - 20 mg/dL   Creatinine, Ser 1.08 0.61 - 1.24 mg/dL   Calcium 9.3 8.9 - 10.3 mg/dL   Total Protein 8.6 (H) 6.5 - 8.1  g/dL   Albumin 4.6 3.5 - 5.0 g/dL   AST 26 15 - 41 U/L   ALT 12 (L) 17 - 63 U/L   Alkaline Phosphatase 92 38 - 126 U/L   Total Bilirubin 0.7 0.3 - 1.2 mg/dL   GFR calc non Af Amer >60 >60 mL/min   GFR calc Af Amer >60 >60 mL/min    Comment: (NOTE) The eGFR has been calculated using the CKD EPI equation. This calculation has not been validated in all clinical situations. eGFR's persistently <60 mL/min signify possible Chronic Kidney Disease.    Anion gap 7 5 - 15  Ethanol     Status: None   Collection Time: 06/21/16  1:37 AM  Result Value Ref Range   Alcohol, Ethyl (B) <5 <5 mg/dL    Comment:        LOWEST DETECTABLE LIMIT FOR SERUM ALCOHOL IS 5 mg/dL FOR MEDICAL PURPOSES ONLY   Troponin I     Status: None   Collection Time: 06/21/16  1:37 AM  Result Value Ref Range   Troponin I <0.03 <0.03 ng/mL  Brain natriuretic peptide     Status: Abnormal   Collection Time: 06/21/16  1:37 AM  Result Value Ref Range   B Natriuretic Peptide 534.0 (H) 0.0 - 100.0 pg/mL  Protime-INR     Status: Abnormal   Collection Time: 06/21/16  1:37 AM  Result Value Ref Range   Prothrombin Time 16.4 (H) 11.4 - 15.2 seconds   INR 1.31   Lactic acid, plasma     Status: None   Collection Time: 06/21/16  1:38 AM  Result Value Ref Range   Lactic Acid, Venous 1.2 0.5 - 1.9 mmol/L  Blood gas, arterial     Status: Abnormal   Collection Time: 06/21/16  1:38 AM  Result Value Ref Range   FIO2 0.50    Delivery systems BILEVEL POSITIVE AIRWAY PRESSURE    Inspiratory PAP 12    Expiratory PAP 6.0    pH, Arterial 7.16 (LL) 7.350 - 7.450    Comment: CRITICAL RESULT CALLED TO, READ BACK BY AND VERIFIED WITH: DR.SUNG AT 0203 ON 06/21/16 KSL    pCO2 arterial 72 (HH) 32.0 - 48.0 mmHg    Comment: CRITICAL RESULT CALLED TO, READ BACK BY AND VERIFIED WITH: DR.SUNG AT 0203 ON 06/21/16 KSL    pO2, Arterial 132 (H) 83.0 - 108.0 mmHg   Bicarbonate 25.7 20.0 -  28.0 mmol/L   Acid-base deficit 4.9 (H) 0.0 - 2.0 mmol/L    O2 Saturation 98.2 %   Patient temperature 37.0    Collection site RIGHT RADIAL    Sample type ARTERIAL DRAW    Allens test (pass/fail) PASS PASS   Mechanical Rate 8   Glucose, capillary     Status: None   Collection Time: 06/21/16  5:49 AM  Result Value Ref Range   Glucose-Capillary 93 65 - 99 mg/dL   Dg Chest Portable 1 View  Result Date: 06/21/2016 CLINICAL DATA:  Shortness of breath. Acute onset respiratory distress this morning. EXAM: PORTABLE CHEST 1 VIEW COMPARISON:  07/07/2009 FINDINGS: Normal heart size and pulmonary vascularity. Emphysematous changes in the lungs. No airspace disease or consolidation in the lungs. No blunting of costophrenic angles. No pneumothorax. Calcification of the aorta. Mediastinal contours appear intact. IMPRESSION: Emphysematous changes in the lungs. No evidence of active pulmonary disease. Electronically Signed   By: Lucienne Capers M.D.   On: 06/21/2016 01:51    Review of Systems  Constitutional: Negative for chills and fever.  HENT: Negative for sore throat and tinnitus.   Eyes: Negative for blurred vision and redness.  Respiratory: Positive for cough and shortness of breath.        Subcostal pain bilaterally  Cardiovascular: Negative for chest pain, palpitations, orthopnea and PND.  Gastrointestinal: Positive for vomiting. Negative for abdominal pain, diarrhea and nausea.  Genitourinary: Negative for dysuria, frequency and urgency.  Musculoskeletal: Negative for joint pain and myalgias.  Skin: Negative for rash.       No lesions  Neurological: Negative for speech change, focal weakness and weakness.  Endo/Heme/Allergies: Does not bruise/bleed easily.       No temperature intolerance  Psychiatric/Behavioral: Negative for depression and suicidal ideas.    Blood pressure (!) 152/87, pulse (!) 110, temperature 98.6 F (37 C), temperature source Oral, resp. rate (!) 29, height '5\' 7"'  (1.702 m), weight 53.9 kg (118 lb 13.3 oz), SpO2 100  %. Physical Exam  Constitutional: He is oriented to person, place, and time. He appears well-developed and well-nourished. No distress.  HENT:  Head: Normocephalic and atraumatic.  Mouth/Throat: Oropharynx is clear and moist.  Eyes: Conjunctivae and EOM are normal. Pupils are equal, round, and reactive to light. No scleral icterus.  Neck: Normal range of motion. Neck supple. No JVD present. No tracheal deviation present. No thyromegaly present.  Cardiovascular: Normal rate, regular rhythm and normal heart sounds.  Exam reveals no gallop and no friction rub.   No murmur heard. Respiratory: Breath sounds normal. Tachypnea noted. He is in respiratory distress.  GI: Soft. Bowel sounds are normal. He exhibits no distension. There is no tenderness.  Genitourinary:  Genitourinary Comments: Deferred  Musculoskeletal: Normal range of motion. He exhibits no edema.  Lymphadenopathy:    He has no cervical adenopathy.  Neurological: He is alert and oriented to person, place, and time. No cranial nerve deficit.  Skin: Skin is warm and dry. No rash noted. No erythema.  Psychiatric: He has a normal mood and affect. His behavior is normal. Judgment and thought content normal.     Assessment/Plan This is a 56 year old male admitted for acute right heart failure. 1. CHF: Acute; right-sided. Likely systolic. Blood pressure initially severely elevated. The patient received hydralazine, Nitropaste and Lasix IV in the emergency department. His lungs are now clear. Obtain echocardiogram to evaluate cardiac function. Manage blood pressure. Cardiology consult placed. 2. Acute respiratory failure: With hypercapnia; the patient  is currently on BiPAP. Work of breathing is improved. Continue steroid taper. Azithromycin for anti-inflammatory effect. 3. COPD: Appear severe. Continue inhaled corticosteroid. Add anticholinergic. Albuterol as needed for shortness of breath 4. Hypertension: Uncontrolled; continue lisinopril  and carvedilol. Hydralazine as needed 5. Elevated troponin: Secondary to demand ischemia. Continue to follow biomarkers. Monitor telemetry 6. Hyperlipidemia: Continue statin therapy 7. History of PE: INR subtherapeutic at this time. Anticoagulate with Lovenox for now. Warfarin consult to pharmacy for by mouth dosing. 8. DVT prophylaxis: As above 9. GI prophylaxis: None The patient is a full code. Time spent on admission orders and critical care approximately 45 minutes  Harrie Foreman, MD 06/21/2016, 7:07 AM

## 2016-06-22 ENCOUNTER — Inpatient Hospital Stay: Payer: Non-veteran care

## 2016-06-22 ENCOUNTER — Inpatient Hospital Stay (HOSPITAL_COMMUNITY)
Admit: 2016-06-22 | Discharge: 2016-06-22 | Disposition: A | Discharge status: Home / Self Care | Payer: Non-veteran care | Attending: Pulmonary Disease | Admitting: Pulmonary Disease

## 2016-06-22 DIAGNOSIS — J9602 Acute respiratory failure with hypercapnia: Secondary | ICD-10-CM | POA: Diagnosis not present

## 2016-06-22 DIAGNOSIS — I361 Nonrheumatic tricuspid (valve) insufficiency: Secondary | ICD-10-CM

## 2016-06-22 DIAGNOSIS — I351 Nonrheumatic aortic (valve) insufficiency: Secondary | ICD-10-CM

## 2016-06-22 LAB — HEMOGLOBIN A1C
Hgb A1c MFr Bld: 5.3 % (ref 4.8–5.6)
Mean Plasma Glucose: 105 mg/dL

## 2016-06-22 LAB — PROTIME-INR
INR: 1.97
Prothrombin Time: 22.7 seconds — ABNORMAL HIGH (ref 11.4–15.2)

## 2016-06-22 MED ORDER — SODIUM CHLORIDE 0.9% FLUSH
3.0000 mL | Freq: Two times a day (BID) | INTRAVENOUS | Status: DC
Start: 2016-06-22 — End: 2016-06-23
  Administered 2016-06-22 – 2016-06-23 (×2): 3 mL via INTRAVENOUS

## 2016-06-22 MED ORDER — WARFARIN SODIUM 5 MG PO TABS: 5.0000 mg | ORAL_TABLET | ORAL | Status: DC

## 2016-06-22 MED ORDER — SODIUM CHLORIDE 0.9% FLUSH
3.0000 mL | Freq: Two times a day (BID) | INTRAVENOUS | Status: DC
  Administered 2016-06-22 – 2016-06-23 (×2): 3 mL via INTRAVENOUS

## 2016-06-22 MED ORDER — WARFARIN SODIUM 5 MG PO TABS
2.5000 mg | ORAL_TABLET | ORAL | Status: DC
  Administered 2016-06-22: 2.5 mg via ORAL
  Filled 2016-06-22: qty 1

## 2016-06-22 MED ORDER — SODIUM CHLORIDE 0.9% FLUSH: 3.0000 mL | INTRAVENOUS | Status: DC | PRN

## 2016-06-22 NOTE — Progress Notes (Signed)
ANTICOAGULATION CONSULT NOTE   Pharmacy Consult for warfarin Indication: history of PE   Pharmacy consulted for warfarin management for 56 yo male with history of PE. Patient takes warfarin 5mg  Sunday/Monday/Tuesday/Thursday/Saturday and 2.5mg  on Wednesdays and Fridays. Patient's INR subtherapeutic on admission. Patient currenty ordered enoxaparin 60 mg SQ BID.  Goal of Therapy:  INR 2-3 Monitor platelets by anticoagulation protocol: Yes   Date INR Dose 5/22 1.31 7.5 mg 5/23 1.97 2.5 mg  Plan:  INR jumped 0.66 after an increased dose. Patient usually takes 5 mg po Mondays, Tuesdays, Thursdays, Saturdays, and Sundays; 2.5 mg po on Wednesdays and Fridays. Will resume home dosing.   Allergies  Allergen Reactions  . Ivp Dye [Iodinated Diagnostic Agents] Hives    Patient Measurements: Height: 5\' 7"  (170.2 cm) Weight: 117 lb 6.4 oz (53.3 kg) IBW/kg (Calculated) : 66.1 Heparin Dosing Weight:   Vital Signs: Temp: 97.6 F (36.4 C) (05/23 0419) Temp Source: Oral (05/23 0419) BP: 107/49 (05/23 0526) Pulse Rate: 54 (05/23 0526)  Labs:  Recent Labs  06/21/16 0137 06/21/16 0623 06/21/16 1135 06/21/16 1757 06/22/16 0357  HGB 14.8  --   --   --   --   HCT 44.1  --   --   --   --   PLT 170  --   --   --   --   LABPROT 16.4*  --   --   --  22.7*  INR 1.31  --   --   --  1.97  CREATININE 1.08  --   --   --   --   TROPONINI <0.03 0.14* 0.29* 0.42*  --     Estimated Creatinine Clearance: 56.2 mL/min (by C-G formula based on SCr of 1.08 mg/dL).   Medical History: Past Medical History:  Diagnosis Date  . Hypertension     Medications:  Scheduled:  . aspirin EC  81 mg Oral Daily  . atorvastatin  20 mg Oral QHS  . budesonide (PULMICORT) nebulizer solution  0.25 mg Nebulization BID  . carvedilol  12.5 mg Oral BID WC  . cholecalciferol  2,000 Units Oral Daily  . docusate sodium  100 mg Oral BID  . doxycycline  100 mg Oral Q12H  . enoxaparin (LOVENOX) injection  1 mg/kg  Subcutaneous Q12H  . gabapentin  400 mg Oral BID  . ipratropium-albuterol  3 mL Nebulization Q6H  . lisinopril  10 mg Oral Daily  . nicotine  21 mg Transdermal Daily  . pneumococcal 23 valent vaccine  0.5 mL Intramuscular Tomorrow-1000  . predniSONE  60 mg Oral Q breakfast  . traZODone  25 mg Oral QHS  . Warfarin - Pharmacist Dosing Inpatient   Does not apply [phone]    Pharmacy will continue to monitor and adjust per consult.   Shamar Kracke A. [place], [place].D., BCPS Clinical Pharmacist 06/22/2016

## 2016-06-22 NOTE — Consult Note (Signed)
Reason for Consult: Shortness of breath history failure Referring Physician: Dr. Rosilyn Mings hospitalist [facility] in [place]. is an 56 y.o. male.  HPI: The patient presented with severe dyspnea shortness of breath no history of tobacco abuse as well as moderate to severe COPD. Patient presented with impending listed failure was placed on BiPAP as well as inhalers and steroid therapy to help with symptoms patient was initially acidotic. PH of 7.16. Patient was treated aggressively from a pulmonary standpoint with BiPAP and inhalers and had some improvement still significantly dyspneic denied any chest pain has a history of DVT PE and is been on Coumadin his PT/INR were subtherapeutic usually followed at [facility] feels somewhat better this morning symptoms been ongoing for several days denies any fever chills or sweats has had persistent recurrent cough essentially nonproductive. Patient states that been compliant with medication  Past Medical History:  Diagnosis Date  . Hypertension     Past Surgical History:  Procedure Laterality Date  . KNEE ARTHROSCOPY Left   . ORIF FEMUR FRACTURE Right     Family History  Problem Relation Age of Onset  . CAD Mother   . Emphysema Father     Social History:  reports that he has been smoking.  He has never used smokeless tobacco. He reports that he drinks alcohol. He reports that he does not use drugs.  Allergies:  Allergies  Allergen Reactions  . Ivp Dye [Iodinated Diagnostic Agents] Hives    Medications: I have reviewed the patient's current medications.  Results for orders placed or performed during the hospital encounter of 06/21/16 (from the past 48 hour(s))  CBC with Differential     Status: Abnormal   Collection Time: 06/21/16  1:37 AM  Result Value Ref Range   WBC 12.2 (H) 3.8 - 10.6 K/uL   RBC 5.03 4.40 - 5.90 MIL/uL   Hemoglobin 14.8 13.0 - 18.0 g/dL   HCT 44.1 40.0 - 52.0 %   MCV  87.7 80.0 - 100.0 fL   MCH 29.5 26.0 - 34.0 pg   MCHC 33.7 32.0 - 36.0 g/dL   RDW 14.2 11.5 - 14.5 %   Platelets 170 150 - 440 K/uL   Neutrophils Relative % 71 %   Neutro Abs 8.6 (H) 1.4 - 6.5 K/uL   Lymphocytes Relative 23 %   Lymphs Abs 2.9 1.0 - 3.6 K/uL   Monocytes Relative 5 %   Monocytes Absolute 0.6 0.2 - 1.0 K/uL   Eosinophils Relative 1 %   Eosinophils Absolute 0.1 0 - 0.7 K/uL   Basophils Relative 0 %   Basophils Absolute 0.0 0 - 0.1 K/uL  Comprehensive metabolic panel     Status: Abnormal   Collection Time: 06/21/16  1:37 AM  Result Value Ref Range   Sodium 140 135 - 145 mmol/L   Potassium 4.2 3.5 - 5.1 mmol/L   Chloride 106 101 - 111 mmol/L   CO2 27 22 - 32 mmol/L   Glucose, Bld 109 (H) 65 - 99 mg/dL   BUN 11 6 - 20 mg/dL   Creatinine, Ser 1.08 0.61 - 1.24 mg/dL   Calcium 9.3 8.9 - 10.3 mg/dL   Total Protein 8.6 (H) 6.5 - 8.1 g/dL   Albumin 4.6 3.5 - 5.0 g/dL   AST 26 15 - 41 U/L   ALT 12 (L) 17 - 63 U/L   Alkaline Phosphatase 92 38 - 126 U/L   Total Bilirubin 0.7  0.3 - 1.2 mg/dL   GFR calc non Af Amer >60 >60 mL/min   GFR calc Af Amer >60 >60 mL/min    Comment: (NOTE) The eGFR has been calculated using the CKD EPI equation. This calculation has not been validated in all clinical situations. eGFR's persistently <60 mL/min signify possible Chronic Kidney Disease.    Anion gap 7 5 - 15  Ethanol     Status: None   Collection Time: 06/21/16  1:37 AM  Result Value Ref Range   Alcohol, Ethyl (B) <5 <5 mg/dL    Comment:        LOWEST DETECTABLE LIMIT FOR SERUM ALCOHOL IS 5 mg/dL FOR MEDICAL PURPOSES ONLY   Troponin I     Status: None   Collection Time: 06/21/16  1:37 AM  Result Value Ref Range   Troponin I <0.03 <0.03 ng/mL  Brain natriuretic peptide     Status: Abnormal   Collection Time: 06/21/16  1:37 AM  Result Value Ref Range   B Natriuretic Peptide 534.0 (H) 0.0 - 100.0 pg/mL  Protime-INR     Status: Abnormal   Collection Time: 06/21/16  1:37 AM   Result Value Ref Range   Prothrombin Time 16.4 (H) 11.4 - 15.2 seconds   INR 1.31   Lactic acid, plasma     Status: None   Collection Time: 06/21/16  1:38 AM  Result Value Ref Range   Lactic Acid, Venous 1.2 0.5 - 1.9 mmol/L  Blood gas, arterial     Status: Abnormal   Collection Time: 06/21/16  1:38 AM  Result Value Ref Range   FIO2 0.50    Delivery systems BILEVEL POSITIVE AIRWAY PRESSURE    Inspiratory PAP 12    Expiratory PAP 6.0    pH, Arterial 7.16 (LL) 7.350 - 7.450    Comment: CRITICAL RESULT CALLED TO, READ BACK BY AND VERIFIED WITH: DR.SUNG AT 0203 ON 06/21/16 KSL    pCO2 arterial 72 (HH) 32.0 - 48.0 mmHg    Comment: CRITICAL RESULT CALLED TO, READ BACK BY AND VERIFIED WITH: DR.SUNG AT 0203 ON 06/21/16 KSL    pO2, Arterial 132 (H) 83.0 - 108.0 mmHg   Bicarbonate 25.7 20.0 - 28.0 mmol/L   Acid-base deficit 4.9 (H) 0.0 - 2.0 mmol/L   O2 Saturation 98.2 %   Patient temperature 37.0    Collection site RIGHT RADIAL    Sample type ARTERIAL DRAW    Allens test (pass/fail) PASS PASS   Mechanical Rate 8   MRSA PCR Screening     Status: None   Collection Time: 06/21/16  5:49 AM  Result Value Ref Range   MRSA by PCR NEGATIVE NEGATIVE    Comment:        The GeneXpert MRSA Assay (FDA approved for NASAL specimens only), is one component of a comprehensive MRSA colonization surveillance program. It is not intended to diagnose MRSA infection nor to guide or monitor treatment for MRSA infections.   Glucose, capillary     Status: None   Collection Time: 06/21/16  5:49 AM  Result Value Ref Range   Glucose-Capillary 93 65 - 99 mg/dL  Lactic acid, plasma     Status: Abnormal   Collection Time: 06/21/16  6:23 AM  Result Value Ref Range   Lactic Acid, Venous 2.0 (HH) 0.5 - 1.9 mmol/L    Comment: CRITICAL RESULT CALLED TO, READ BACK BY AND VERIFIED WITH TESS THOMAS AT 0720 ON 06/21/16 [place].   TSH  Status: Abnormal   Collection Time: 06/21/16  6:23 AM  Result Value Ref  Range   TSH 0.316 (L) 0.350 - 4.500 uIU/mL    Comment: Performed by a 3rd Generation assay with a functional sensitivity of <=0.01 uIU/mL.  Troponin I     Status: Abnormal   Collection Time: 06/21/16  6:23 AM  Result Value Ref Range   Troponin I 0.14 (HH) <0.03 ng/mL    Comment: CRITICAL RESULT CALLED TO, READ BACK BY AND VERIFIED WITH BRAD CHRISMON AT 0750 ON 06/21/16 [place].   Hemoglobin A1c     Status: None   Collection Time: 06/21/16  6:23 AM  Result Value Ref Range   Hgb A1c MFr Bld 5.3 4.8 - 5.6 %    Comment: (NOTE)         Pre-diabetes: 5.7 - 6.4         Diabetes: >6.4         Glycemic control for adults with diabetes: <7.0    Mean Plasma Glucose 105 mg/dL    Comment: (NOTE) Performed At: [facility] [place], [place] [id number] Lindon Romp MD SV:[id number]   Procalcitonin - Baseline     Status: None   Collection Time: 06/21/16  6:23 AM  Result Value Ref Range   Procalcitonin 0.35 ng/mL    Comment:        Interpretation: PCT (Procalcitonin) <= 0.5 ng/mL: Systemic infection (sepsis) is not likely. Local bacterial infection is possible. (NOTE)         ICU PCT Algorithm               Non ICU PCT Algorithm    ----------------------------     ------------------------------         PCT < 0.25 ng/mL                 PCT < 0.1 ng/mL     Stopping of antibiotics            Stopping of antibiotics       strongly encouraged.               strongly encouraged.    ----------------------------     ------------------------------       PCT level decrease by               PCT < 0.25 ng/mL       >= 80% from peak PCT       OR PCT 0.25 - 0.5 ng/mL          Stopping of antibiotics                                             encouraged.     Stopping of antibiotics           encouraged.    ----------------------------     ------------------------------       PCT level decrease by              PCT >= 0.25 ng/mL       < 80% from peak PCT        AND PCT >= 0.5  ng/mL            Continuin g antibiotics  encouraged.       Continuing antibiotics            encouraged.    ----------------------------     ------------------------------     PCT level increase compared          PCT > 0.5 ng/mL         with peak PCT AND          PCT >= 0.5 ng/mL             Escalation of antibiotics                                          strongly encouraged.      Escalation of antibiotics        strongly encouraged.   Troponin I     Status: Abnormal   Collection Time: 06/21/16 11:35 AM  Result Value Ref Range   Troponin I 0.29 (HH) <0.03 ng/mL    Comment: CRITICAL RESULT CALLED TO, READ BACK BY AND VERIFIED WITH BRAD CHRISMON 06/21/16 1225 KLW   Troponin I     Status: Abnormal   Collection Time: 06/21/16  5:57 PM  Result Value Ref Range   Troponin I 0.42 (HH) <0.03 ng/mL    Comment: CRITICAL VALUE NOTED. VALUE IS CONSISTENT WITH PREVIOUSLY REPORTED/CALLED VALUE JJB  Protime-INR     Status: Abnormal   Collection Time: 06/22/16  3:57 AM  Result Value Ref Range   Prothrombin Time 22.7 (H) 11.4 - 15.2 seconds   INR 1.97     Dg Chest Port 1 View  Result Date: 06/22/2016 CLINICAL DATA:  Respiratory failure. EXAM: PORTABLE CHEST 1 VIEW COMPARISON:  06/21/2016. FINDINGS: Mediastinum hilar structures normal. Lungs are clear. Cardiomegaly with normal pulmonary vascularity. No pneumothorax. IMPRESSION: Cardiomegaly with normal pulmonary vascularity. No CHF. No acute pulmonary disease. Electronically Signed   By: Marcello Moores  Register   On: 06/22/2016 06:27   Dg Chest Portable 1 View  Result Date: 06/21/2016 CLINICAL DATA:  Shortness of breath. Acute onset respiratory distress this morning. EXAM: PORTABLE CHEST 1 VIEW COMPARISON:  07/07/2009 FINDINGS: Normal heart size and pulmonary vascularity. Emphysematous changes in the lungs. No airspace disease or consolidation in the lungs. No blunting of costophrenic angles. No pneumothorax.  Calcification of the aorta. Mediastinal contours appear intact. IMPRESSION: Emphysematous changes in the lungs. No evidence of active pulmonary disease. Electronically Signed   By: Lucienne Capers M.D.   On: 06/21/2016 01:51    Review of Systems  Constitutional: Positive for malaise/fatigue.  HENT: Positive for congestion.   Eyes: Negative.   Respiratory: Positive for shortness of breath and wheezing.   Cardiovascular: Positive for palpitations, leg swelling and PND.  Gastrointestinal: Negative.   Genitourinary: Negative.   Musculoskeletal: Positive for joint pain and myalgias.  Skin: Negative.   Neurological: Positive for weakness.  Endo/Heme/Allergies: Negative.   Psychiatric/Behavioral: Negative.    Blood pressure 108/64, pulse 71, temperature 97.9 F (36.6 C), temperature source Oral, resp. rate 18, height '5\' 7"'  (1.702 m), weight 53.3 kg (117 lb 6.4 oz), SpO2 98 %. Physical Exam  Nursing note and vitals reviewed. Constitutional: He is oriented to person, place, and time. He appears well-developed and well-nourished.  HENT:  Head: Normocephalic and atraumatic.  Eyes: Conjunctivae and EOM are normal. Pupils are equal, round, and reactive to light.  Neck: Normal range of motion. Neck supple.  Cardiovascular:  Normal rate and regular rhythm.   Murmur heard. Respiratory: Tachypnea noted. He has decreased breath sounds. He has wheezes. He has rhonchi.  GI: Soft. Bowel sounds are normal.  Musculoskeletal: Normal range of motion.  Neurological: He is alert and oriented to person, place, and time. He has normal reflexes.  Skin: Skin is warm and dry.  Psychiatric: He has a normal mood and affect.    Assessment/Plan: Shortness of breath COPD Coughing History of pulmonary embolus Respiratory failure Smoking Hypertension Congestive heart failure Elevated troponin . Plan Agree with admission to telemetry follow-up troponins EKGs Aggressive pulmonary support inhalers Consider  steroid therapy Consider broad-spectrum antibiotic therapy for possible bronchitis Patient refrain from tobacco abuse Continue Coumadin therapy for anticoagulation initiated DVT PE Suppress his cough therapy BiPAP as needed for*support Right-sided heart failure possibly related to pulmonary hypertension continue aggressive medical therapy Consider echocardiogram for assessment of left ventricular and right ventricular function Continue hypertension control medically Continue pulmonary support for respiratory failure Do not recommend cardiac catheter at this point for possible demand ischemia  Dwayne D Callwood 06/22/2016, 8:25 AM

## 2016-06-22 NOTE — Progress Notes (Signed)
CH responded to a consult for AD. CH met with Pt. Pt stated he wanted to complete Advance Directive and had the brochure completed. [place] gathered 2 witnesses and a Insurance account manager and had Pt complete AD. CH gave original and a copy to the Pt, and a place a copy in Pt's file.    06/22/16 1300  Clinical Encounter Type  Visited With Patient;Patient and family together  Visit Type Initial;Other (Comment)  Referral From Nurse  Consult/Referral To [place];Other (Comment)

## 2016-06-22 NOTE — Progress Notes (Signed)
PT Cancellation Note  Patient Details Name: Christopher JosephJames A Nieland Jr. MRN: [id number] DOB: 1960/07/29   Cancelled Treatment:    Reason Eval/Treat Not Completed: PT screened, no needs identified, will sign off (Consult received and chart reviewed.  Per primary RN, patient able to indep ambulate throughout facility without assist device; no PT needs identified.  Will discontinue order; please re-consult should needs change.)   [name], PT, DPT, NCS 06/22/16, 4:37 PM [phone]

## 2016-06-22 NOTE — Progress Notes (Signed)
[facility] - [place] [facility]   PATIENT NAME: Christopher SchmidtJames Deininger    MR#:  [id number]  DATE OF BIRTH:  March 11, 1960  SUBJECTIVE:  CHIEF COMPLAINT:   Chief Complaint  Patient presents with  . Shortness of Breath   - feels much better today, on 1L o2 today - PT consult pending  REVIEW OF SYSTEMS:  Review of Systems  Constitutional: Negative for chills, fever and malaise/fatigue.  HENT: Positive for congestion. Negative for ear discharge, hearing loss and sore throat.   Eyes: Negative for blurred vision and double vision.  Respiratory: Positive for cough and shortness of breath. Negative for wheezing.   Cardiovascular: Negative for chest pain, palpitations and leg swelling.  Gastrointestinal: Negative for abdominal pain, constipation, diarrhea, nausea and vomiting.  Genitourinary: Negative for dysuria and urgency.  Musculoskeletal: Negative for myalgias.  Neurological: Negative for dizziness, speech change, focal weakness, seizures and headaches.  Psychiatric/Behavioral: Negative for depression.    DRUG ALLERGIES:   Allergies  Allergen Reactions  . Ivp Dye [Iodinated Diagnostic Agents] Hives    VITALS:  Blood pressure 105/71, pulse 67, temperature 97.6 F (36.4 C), temperature source Oral, resp. rate 20, height 5\' 7"  (1.702 m), weight 53.3 kg (117 lb 6.4 oz), SpO2 98 %.  PHYSICAL EXAMINATION:  Physical Exam  GENERAL:  56 y.o.-year-old patient lying in the bed with no acute distress.  EYES: Pupils equal, round, reactive to light and accommodation. No scleral icterus. Extraocular muscles intact.  HEENT: Head atraumatic, normocephalic. Oropharynx and nasopharynx clear.  NECK:  Supple, no jugular venous distention. No thyroid enlargement, no tenderness.  LUNGS: diminished breath sounds bilaterally, no wheezing, rales,rhonchi or crepitation. No use of accessory muscles of respiration.  Hyperinflated lungs on CXR, pigeon chest with protrusion  noted. CARDIOVASCULAR: S1, S2 normal. No murmurs, rubs, or gallops.  ABDOMEN: Soft, nontender, nondistended. Bowel sounds present. No organomegaly or mass.  EXTREMITIES: No pedal edema, cyanosis, or clubbing.  NEUROLOGIC: Cranial nerves II through XII are intact. Muscle strength 5/5 in all extremities. Sensation intact. Gait not checked.  PSYCHIATRIC: The patient is alert and oriented x 3.  SKIN: No obvious rash, lesion, or ulcer.    LABORATORY PANEL:   CBC  Recent Labs Lab 06/21/16 0137  WBC 12.2*  HGB 14.8  HCT 44.1  PLT 170   ------------------------------------------------------------------------------------------------------------------  Chemistries   Recent Labs Lab 06/21/16 0137  NA 140  K 4.2  CL 106  CO2 27  GLUCOSE 109*  BUN 11  CREATININE 1.08  CALCIUM 9.3  AST 26  ALT 12*  ALKPHOS 92  BILITOT 0.7   ------------------------------------------------------------------------------------------------------------------  Cardiac Enzymes  Recent Labs Lab 06/21/16 1757  TROPONINI 0.42*   ------------------------------------------------------------------------------------------------------------------  RADIOLOGY:  Dg Chest Port 1 View  Result Date: 06/22/2016 CLINICAL DATA:  Respiratory failure. EXAM: PORTABLE CHEST 1 VIEW COMPARISON:  06/21/2016. FINDINGS: Mediastinum hilar structures normal. Lungs are clear. Cardiomegaly with normal pulmonary vascularity. No pneumothorax. IMPRESSION: Cardiomegaly with normal pulmonary vascularity. No CHF. No acute pulmonary disease. Electronically Signed   By: Maisie Fushomas  Register   On: 06/22/2016 06:27   Dg Chest Portable 1 View  Result Date: 06/21/2016 CLINICAL DATA:  Shortness of breath. Acute onset respiratory distress this morning. EXAM: PORTABLE CHEST 1 VIEW COMPARISON:  07/07/2009 FINDINGS: Normal heart size and pulmonary vascularity. Emphysematous changes in the lungs. No airspace disease or consolidation in the lungs.  No blunting of costophrenic angles. No pneumothorax. Calcification of the aorta. Mediastinal contours appear intact. IMPRESSION: Emphysematous changes in  the lungs. No evidence of active pulmonary disease. Electronically Signed   By: Burman Nieves M.D.   On: 06/21/2016 01:51    EKG:   Orders placed or performed during the hospital encounter of 06/21/16  . EKG 12-Lead  . EKG 12-Lead    ASSESSMENT AND PLAN:   56 year old male with past medical history significant for hypertension, COPD not on home oxygen, history of pulmonary embolism on Coumadin presents to hospital secondary to shortness of breath and noted to have acute respiratory failure.  #1 acute hypoxic hypercapnic respiratory failure-secondary to COPD exacerbation with underlying asthmatic bronchitis. - appreciate pulmonary consult. Patient went to ICU initially and was placed on BiPAP. -Currently on nasal cannula and wean oxygen as tolerated. Not on home o2 -was on IV steroids- changed to oral prednisone today, also on bronchodilators and will be transitioned to home inhalers. -Continue empiric doxycycline.  #2 chronic congestive heart failure-unknown ejection fraction. No fluid overload on exam today. -Follow up echocardiogram. -Continue aspirin, statin, Coreg at this time.  #3 elevated troponin- likely demand ischemia, no chest pain - cards consult pending - on lovenox- discontinue as on coumadin and INR at 1.9 today  #4 hypertension-on Coreg, lisinopril. Use IV hydralazine as needed for hypertensive urgency.  #5 tobacco use disorder-strongly counseled against smoking nicotine patch  #6 DVT prophylaxis-on Coumadin, INR almost therapeutic   PT today   All the records are reviewed and case discussed with Care Management/Social Workerr. Management plans discussed with the patient, family and they are in agreement.  CODE STATUS: Full Code  TOTAL TIME TAKING CARE OF THIS PATIENT: 38 minutes.   POSSIBLE D/C  tomorrow, DEPENDING ON CLINICAL CONDITION.   Enid Baas M.D on 06/22/2016 at 12:10 PM  Between 7am to 6pm - Pager - [phone]  After 6pm go to [email or web address] - password [employer]  [facility] [place]  Office  [phone]  CC: Primary care physician; Patient, No Pcp Per

## 2016-06-22 NOTE — Progress Notes (Signed)
O2 sats on R/A 94 %.  Oxygen discontinued

## 2016-06-22 NOTE — Progress Notes (Signed)
*  PRELIMINARY RESULTS* Echocardiogram 2D Echocardiogram has been performed.  Cristela BlueHege, [place] Saber 06/22/2016, 4:07 PM

## 2016-06-23 ENCOUNTER — Telehealth: Payer: Self-pay | Admitting: Pulmonary Disease

## 2016-06-23 LAB — BASIC METABOLIC PANEL
Anion gap: 6 (ref 5–15)
BUN: 34 mg/dL — AB (ref 6–20)
CO2: 29 mmol/L (ref 22–32)
CREATININE: 1.16 mg/dL (ref 0.61–1.24)
Calcium: 9.1 mg/dL (ref 8.9–10.3)
Chloride: 103 mmol/L (ref 101–111)
GFR calc Af Amer: >60 mL/min (ref >60)
GLUCOSE: 136 mg/dL — AB (ref 65–99)
Potassium: 3.6 mmol/L (ref 3.5–5.1)
Sodium: 138 mmol/L (ref 135–145)

## 2016-06-23 LAB — PROTIME-INR
INR: 2.57
PROTHROMBIN TIME: 28.1 s — AB (ref 11.4–15.2)

## 2016-06-23 LAB — ECHOCARDIOGRAM COMPLETE
Height: 67 in
Weight: 1878.4 [oz_av]

## 2016-06-23 MED ORDER — DOXYCYCLINE HYCLATE 100 MG PO TABS: 100.0000 mg | ORAL_TABLET | Freq: Two times a day (BID) | ORAL | 0 refills | Status: DC

## 2016-06-23 MED ORDER — PREDNISONE 10 MG (21) PO TBPK: ORAL_TABLET | ORAL | 0 refills | Status: DC

## 2016-06-23 MED ORDER — WARFARIN SODIUM 5 MG PO TABS: 5.0000 mg | ORAL_TABLET | ORAL | Status: DC

## 2016-06-23 MED ORDER — BUDESONIDE-FORMOTEROL FUMARATE 160-4.5 MCG/ACT IN AERO: 2.0000 | INHALATION_SPRAY | Freq: Two times a day (BID) | RESPIRATORY_TRACT | 12 refills | Status: DC

## 2016-06-23 MED ORDER — IPRATROPIUM-ALBUTEROL 0.5-2.5 (3) MG/3ML IN SOLN: 3.0000 mL | Freq: Four times a day (QID) | RESPIRATORY_TRACT | 2 refills | Status: DC | PRN

## 2016-06-23 MED ORDER — WARFARIN SODIUM 5 MG PO TABS: 2.5000 mg | ORAL_TABLET | ORAL | Status: DC

## 2016-06-23 MED ORDER — NICOTINE 21 MG/24HR TD PT24: 21.0000 mg | MEDICATED_PATCH | Freq: Every day | TRANSDERMAL | 0 refills | Status: DC

## 2016-06-23 MED ORDER — WARFARIN SODIUM 5 MG PO TABS
2.5000 mg | ORAL_TABLET | Freq: Once | ORAL | Status: DC
Start: 2016-06-23 — End: 2016-06-23

## 2016-06-23 MED ORDER — ASPIRIN 81 MG PO TBEC: 81.0000 mg | DELAYED_RELEASE_TABLET | Freq: Every day | ORAL | 2 refills | Status: DC

## 2016-06-23 NOTE — Progress Notes (Signed)
Patient is discharge home in a stable condition, summary and f/u care given , verbalized understanding, 

## 2016-06-23 NOTE — Progress Notes (Signed)
ANTICOAGULATION CONSULT NOTE   Pharmacy Consult for warfarin Indication: history of PE   Pharmacy consulted for warfarin management for 56 yo male with history of PE. Patient takes warfarin 5mg  Sunday/Monday/Tuesday/Thursday/Saturday and 2.5mg  on Wednesdays and Fridays.   Goal of Therapy:  INR 2-3 Monitor platelets by anticoagulation protocol: Yes   Date INR Dose 5/22 1.31 7.5 mg 5/23 1.97 2.5 mg 5/24 2.57 2.5 mg  Plan:  5/23 INR jumped 0.66 after an increased dose. Patient usually takes 5 mg po Mondays, Tuesdays, Thursdays, Saturdays, and Sundays; 2.5 mg po on Wednesdays and Fridays. Will resume home dosing.   5/24 INR jumped another 0.6 from yesterday. Will reduce home dose to 2.5 mg po once today, then resume home dosing based on INR.   Allergies  Allergen Reactions  . Ivp Dye [Iodinated Diagnostic Agents] Hives    Patient Measurements: Height: 5\' 7"  (170.2 cm) Weight: 121 lb 3.2 oz (55 kg) IBW/kg (Calculated) : 66.1 Heparin Dosing Weight:   Vital Signs: Temp: 98.2 F (36.8 C) (05/24 0911) Temp Source: Oral (05/24 0911) BP: 159/71 (05/24 0911) Pulse Rate: 77 (05/24 0911)  Labs:  Recent Labs  06/21/16 0137 06/21/16 [id number] 06/21/16 1135 06/21/16 1757 06/22/16 0357 06/23/16 0502  HGB 14.8  --   --   --   --   --   HCT 44.1  --   --   --   --   --   PLT 170  --   --   --   --   --   LABPROT 16.4*  --   --   --  22.7* 28.1*  INR 1.31  --   --   --  1.97 2.57  CREATININE 1.08  --   --   --   --  1.16  TROPONINI <0.03 0.14* 0.29* 0.42*  --   --     Estimated Creatinine Clearance: 54 mL/min (by C-G formula based on SCr of 1.16 mg/dL).   Medical History: Past Medical History:  Diagnosis Date  . Hypertension     Medications:  Scheduled:  . aspirin EC  81 mg Oral Daily  . atorvastatin  20 mg Oral QHS  . budesonide (PULMICORT) nebulizer solution  0.25 mg Nebulization BID  . carvedilol  12.5 mg Oral BID WC  . cholecalciferol  2,000 Units Oral Daily  .  docusate sodium  100 mg Oral BID  . doxycycline  100 mg Oral Q12H  . gabapentin  400 mg Oral BID  . ipratropium-albuterol  3 mL Nebulization Q6H  . lisinopril  10 mg Oral Daily  . nicotine  21 mg Transdermal Daily  . predniSONE  60 mg Oral Q breakfast  . sodium chloride flush  3 mL Intravenous Q12H  . sodium chloride flush  3 mL Intravenous Q12H  . traZODone  25 mg Oral QHS  . [START ON 06/24/2016] warfarin  5 mg Oral Once per day on Sun Mon Tue Thu Sat   And  . [START ON 06/24/2016] warfarin  2.5 mg Oral Once per day on Wed Fri  . warfarin  2.5 mg Oral ONCE-1800  . Warfarin - Pharmacist Dosing Inpatient   Does not apply [phone]    Pharmacy will continue to monitor and adjust per consult.   Alilah Mcmeans A. [place]son, [place]D., BCPS Clinical Pharmacist 06/23/2016

## 2016-06-23 NOTE — Telephone Encounter (Signed)
Pt needs 2 week hosp f/u

## 2016-06-23 NOTE — Care Management (Signed)
No discharge needs identified by members of the care team 

## 2016-06-23 NOTE — Telephone Encounter (Signed)
Pt is schedule for June 28th to see Dr Sung AmabileSimonds

## 2016-06-23 NOTE — Discharge Summary (Signed)
[facility] - [place] at [facility]   PATIENT NAME: Christopher Bryan    MR#:  [id number]  DATE OF BIRTH:  05-15-1960  DATE OF ADMISSION:  06/21/2016   ADMITTING PHYSICIAN: Arnaldo Natal, MD  DATE OF DISCHARGE: 06/23/16  PRIMARY CARE PHYSICIAN: Patient, No Pcp Per   ADMISSION DIAGNOSIS:   SOB (shortness of breath) [R06.02] COPD exacerbation (HCC) [J44.1] Acute respiratory failure with hypercapnia (HCC) [J96.02] Essential hypertension [I10] Congestive heart failure, unspecified HF chronicity, unspecified heart failure type (HCC) [I50.9] Acute right-sided heart failure (HCC) [I50.811]  DISCHARGE DIAGNOSIS:   Active Problems:   Acute on chronic diastolic CHF (congestive heart failure) (HCC)   Acute right-sided heart failure (HCC)   SECONDARY DIAGNOSIS:   Past Medical History:  Diagnosis Date  . Hypertension     HOSPITAL COURSE:   56 year old male with past medical history significant for hypertension, COPD not on home oxygen, history of pulmonary embolism on Coumadin presents to hospital secondary to shortness of breath and noted to have acute respiratory failure.  #1 acute hypoxic hypercapnic respiratory failure-secondary to COPD exacerbation with underlying asthmatic bronchitis. - appreciate pulmonary consult. Patient went to ICU initially and was placed on BiPAP. -Currently on room air and weaned off oxygen -was on IV steroids- changed to oral prednisone taper, also will be discharged on symbicort and prn nebs -Continue empiric doxycycline. - outpatient pulmonary follow up recommended  #2 chronic congestive heart failure-unknown ejection fraction. No fluid overload on exam. - echocardiogram with LVH and EF 55% -Continue aspirin, statin, Coreg at this time.  #3 elevated troponin- likely demand ischemia, no chest pain - cards consult appreciated  #4 hypertension-on Coreg, lisinopril.  #5 tobacco use disorder-strongly counseled against  smoking nicotine patch  #6 H/o PE- on coumadin, INR therapeutic at discharge compliance to  meds discussed  Discharge today  DISCHARGE CONDITIONS:   Guarded  CONSULTS OBTAINED:   Treatment Team:  Alwyn Pea, MD  DRUG ALLERGIES:   Allergies  Allergen Reactions  . Ivp Dye [Iodinated Diagnostic Agents] Hives   DISCHARGE MEDICATIONS:   Allergies as of 06/23/2016      Reactions   Ivp Dye [iodinated Diagnostic Agents] Hives      Medication List    TAKE these medications   acetaminophen 325 MG tablet Commonly known as:  TYLENOL Take 650 mg by mouth 3 (three) times daily as needed for mild pain or fever.   aspirin 81 MG EC tablet Take 1 tablet (81 mg total) by mouth daily. Start taking on:  06/24/2016   atorvastatin 20 MG tablet Commonly known as:  LIPITOR Take 20 mg by mouth at bedtime.   budesonide-formoterol 160-4.5 MCG/ACT inhaler Commonly known as:  SYMBICORT Inhale 2 puffs into the lungs 2 (two) times daily.   carvedilol 12.5 MG tablet Commonly known as:  COREG Take 12.5 mg by mouth 2 (two) times daily with a meal.   cholecalciferol 1000 units tablet Commonly known as:  VITAMIN D Take 2,000 Units by mouth daily.   doxycycline 100 MG tablet Commonly known as:  VIBRA-TABS Take 1 tablet (100 mg total) by mouth every 12 (twelve) hours. X 5 more days   gabapentin 800 MG tablet Commonly known as:  NEURONTIN Take 400 mg by mouth 2 (two) times daily.   ipratropium-albuterol 0.5-2.5 (3) MG/3ML Soln Commonly known as:  DUONEB Take 3 mLs by nebulization every 6 (six) hours as needed.   lisinopril 10 MG tablet Commonly known as:  PRINIVIL,ZESTRIL Take  10 mg by mouth daily.   loratadine 10 MG tablet Commonly known as:  CLARITIN Take 10 mg by mouth daily as needed for allergies.   nicotine 21 mg/24hr patch Commonly known as:  NICODERM CQ - dosed in mg/24 hours Place 1 patch (21 mg total) onto the skin daily. Start taking on:  06/24/2016     predniSONE 10 MG (21) Tbpk tablet Commonly known as:  STERAPRED UNI-PAK 21 TAB 6 tabs PO x 1 day 5 tabs PO x 1 day 4 tabs PO x 1 day 3 tabs PO x 1 day 2 tabs PO x 1 day 1 tab PO x 1 day and stop   traMADol 50 MG tablet Commonly known as:  ULTRAM Take 50 mg by mouth 2 (two) times daily as needed for moderate pain.   traZODone 50 MG tablet Commonly known as:  DESYREL Take 25 mg by mouth at bedtime.   warfarin 5 MG tablet Commonly known as:  COUMADIN Take 2.5-5 mg by mouth daily. Take 2.5 mg on Wednesday and Friday. Take 5 mg all other days.        DISCHARGE INSTRUCTIONS:   1. PCP f/u in 1-2 weeks 2. Pulmonary f/u in 1 week  DIET:   Cardiac diet  ACTIVITY:   Activity as tolerated  OXYGEN:   Home Oxygen: No.  Oxygen Delivery: room air  DISCHARGE LOCATION:   home   If you experience worsening of your admission symptoms, develop shortness of breath, life threatening emergency, suicidal or homicidal thoughts you must seek medical attention immediately by calling 911 or calling your MD immediately  if symptoms less severe.  You Must read complete instructions/literature along with all the possible adverse reactions/side effects for all the Medicines you take and that have been prescribed to you. Take any new Medicines after you have completely understood and accpet all the possible adverse reactions/side effects.   Please note  You were cared for by a hospitalist during your hospital stay. If you have any questions about your discharge medications or the care you received while you were in the hospital after you are discharged, you can call the unit and asked to speak with the hospitalist on call if the hospitalist that took care of you is not available. Once you are discharged, your primary care physician will handle any further medical issues. Please note that NO REFILLS for any discharge medications will be authorized once you are discharged, as it is imperative that you  return to your primary care physician (or establish a relationship with a primary care physician if you do not have one) for your aftercare needs so that they can reassess your need for medications and monitor your lab values.    On the day of Discharge:  VITAL SIGNS:   Blood pressure (!) 157/72, pulse 70, temperature 98.3 F (36.8 C), temperature source Oral, resp. rate 18, height 5\' 7"  (1.702 m), weight 55 kg (121 lb 3.2 oz), SpO2 91 %.  PHYSICAL EXAMINATION:   GENERAL:  56 y.o.-year-old patient lying in the bed with no acute distress.  EYES: Pupils equal, round, reactive to light and accommodation. No scleral icterus. Extraocular muscles intact.  HEENT: Head atraumatic, normocephalic. Oropharynx and nasopharynx clear.  NECK:  Supple, no jugular venous distention. No thyroid enlargement, no tenderness.  LUNGS: diminished breath sounds bilaterally, no wheezing, rales,rhonchi or crepitation. No use of accessory muscles of respiration.  Hyperinflated lungs on CXR, pigeon chest with protrusion noted. CARDIOVASCULAR: S1, S2 normal. No  murmurs, rubs, or gallops.  ABDOMEN: Soft, nontender, nondistended. Bowel sounds present. No organomegaly or mass.  EXTREMITIES: No pedal edema, cyanosis, or clubbing.  NEUROLOGIC: Cranial nerves II through XII are intact. Muscle strength 5/5 in all extremities. Sensation intact. Gait not checked.  PSYCHIATRIC: The patient is alert and oriented x 3.  SKIN: No obvious rash, lesion, or ulcer.    DATA REVIEW:   CBC  Recent Labs Lab 06/21/16 0137  WBC 12.2*  HGB 14.8  HCT 44.1  PLT 170    Chemistries   Recent Labs Lab 06/21/16 0137 06/23/16 0502  NA 140 138  K 4.2 3.6  CL 106 103  CO2 27 29  GLUCOSE 109* 136*  BUN 11 34*  CREATININE 1.08 1.16  CALCIUM 9.3 9.1  AST 26  --   ALT 12*  --   ALKPHOS 92  --   BILITOT 0.7  --      Microbiology Results  Results for orders placed or performed during the hospital encounter of 06/21/16  MRSA  PCR Screening     Status: None   Collection Time: 06/21/16  5:49 AM  Result Value Ref Range Status   MRSA by PCR NEGATIVE NEGATIVE Final    Comment:        The GeneXpert MRSA Assay (FDA approved for NASAL specimens only), is one component of a comprehensive MRSA colonization surveillance program. It is not intended to diagnose MRSA infection nor to guide or monitor treatment for MRSA infections.     RADIOLOGY:  No results found.   Management plans discussed with the patient, family and they are in agreement.  CODE STATUS:     Code Status Orders        Start     Ordered   06/21/16 0549  Full code  Continuous     06/21/16 0548    Code Status History    Date Active Date Inactive Code Status Order ID Comments User Context   This patient has a current code status but no historical code status.      TOTAL TIME TAKING CARE OF THIS PATIENT: 38 minutes.    Enid Baas M.D on 06/23/2016 at 12:51 PM  Between 7am to 6pm - Pager - [phone]  After 6pm go to [email or web address] - Social research officer, government  [facility]  Office  [phone]  CC: Primary care physician; Patient, No Pcp Per   Note: This dictation was prepared with Dragon dictation along with smaller phrase technology. Any transcriptional errors that result from this process are unintentional.

## 2016-06-23 NOTE — Telephone Encounter (Signed)
-----   Message from Misty R Ahmad, [place] sent at [id number]/22/2G [facility]018 10:31 AM EDT ----- Please schedule below in a 30 minute slot as a hosp f/u.  Thanks, Misty ----- Message ----- From: Merwyn KatosSimonds, David B, MD Sent: 06/21/2016   9:12 AM To: Renea EeMisty R Ahmad, LPN  Post hosp F/U with me in 4-6 wks  Thanks  Theodoro Gristave

## 2016-06-23 NOTE — Telephone Encounter (Signed)
Please schedule 2-4 weeks 1st available provider.

## 2016-07-01 ENCOUNTER — Ambulatory Visit: Payer: Medicare Other | Admitting: Family

## 2016-07-06 ENCOUNTER — Telehealth: Payer: Self-pay | Admitting: Family

## 2016-07-06 ENCOUNTER — Ambulatory Visit: Payer: Medicare Other | Admitting: Family

## 2016-07-06 NOTE — Telephone Encounter (Signed)
Patient missed [facility] on 07[facility]16. Will attempt to reschedule.

## 2016-07-15 ENCOUNTER — Telehealth: Payer: Self-pay | Admitting: Family

## 2016-07-15 ENCOUNTER — Ambulatory Visit: Payer: Medicare Other | Admitting: Family

## 2016-07-15 NOTE — Telephone Encounter (Signed)
Patient missed his initial appointment at [facility] on 07/15/16. This is the 2nd appointment he has missed. Will attempt to reschedule.

## 2016-07-28 ENCOUNTER — Inpatient Hospital Stay: Payer: Medicare Other | Admitting: Pulmonary Disease

## 2018-01-08 ENCOUNTER — Inpatient Hospital Stay: Payer: Medicare Other

## 2018-01-08 ENCOUNTER — Inpatient Hospital Stay
Admission: EM | Admit: 2018-01-08 | Discharge: 2018-01-12 | DRG: 208 | Disposition: A | Discharge status: Home / Self Care | Payer: Medicare Other | Attending: Internal Medicine | Admitting: Internal Medicine

## 2018-01-08 ENCOUNTER — Inpatient Hospital Stay (HOSPITAL_COMMUNITY)
Admit: 2018-01-08 | Discharge: 2018-01-08 | Disposition: A | Discharge status: Home / Self Care | Payer: Medicare Other | Attending: Internal Medicine | Admitting: Internal Medicine

## 2018-01-08 ENCOUNTER — Emergency Department: Payer: Medicare Other

## 2018-01-08 ENCOUNTER — Other Ambulatory Visit: Payer: Self-pay

## 2018-01-08 DIAGNOSIS — I160 Hypertensive urgency: Secondary | ICD-10-CM | POA: Diagnosis present

## 2018-01-08 DIAGNOSIS — F17200 Nicotine dependence, unspecified, uncomplicated: Secondary | ICD-10-CM | POA: Diagnosis present

## 2018-01-08 DIAGNOSIS — Z79899 Other long term (current) drug therapy: Secondary | ICD-10-CM

## 2018-01-08 DIAGNOSIS — Z91041 Radiographic dye allergy status: Secondary | ICD-10-CM | POA: Diagnosis not present

## 2018-01-08 DIAGNOSIS — J9602 Acute respiratory failure with hypercapnia: Secondary | ICD-10-CM

## 2018-01-08 DIAGNOSIS — Z8249 Family history of ischemic heart disease and other diseases of the circulatory system: Secondary | ICD-10-CM

## 2018-01-08 DIAGNOSIS — J441 Chronic obstructive pulmonary disease with (acute) exacerbation: Principal | ICD-10-CM

## 2018-01-08 DIAGNOSIS — I9581 Postprocedural hypotension: Secondary | ICD-10-CM | POA: Diagnosis not present

## 2018-01-08 DIAGNOSIS — Z7901 Long term (current) use of anticoagulants: Secondary | ICD-10-CM | POA: Diagnosis not present

## 2018-01-08 DIAGNOSIS — Z7982 Long term (current) use of aspirin: Secondary | ICD-10-CM

## 2018-01-08 DIAGNOSIS — I110 Hypertensive heart disease with heart failure: Secondary | ICD-10-CM | POA: Diagnosis present

## 2018-01-08 DIAGNOSIS — I361 Nonrheumatic tricuspid (valve) insufficiency: Secondary | ICD-10-CM | POA: Diagnosis not present

## 2018-01-08 DIAGNOSIS — E43 Unspecified severe protein-calorie malnutrition: Secondary | ICD-10-CM | POA: Diagnosis present

## 2018-01-08 DIAGNOSIS — Z7951 Long term (current) use of inhaled steroids: Secondary | ICD-10-CM

## 2018-01-08 DIAGNOSIS — R571 Hypovolemic shock: Secondary | ICD-10-CM

## 2018-01-08 DIAGNOSIS — I5032 Chronic diastolic (congestive) heart failure: Secondary | ICD-10-CM | POA: Diagnosis present

## 2018-01-08 DIAGNOSIS — J9622 Acute and chronic respiratory failure with hypercapnia: Secondary | ICD-10-CM | POA: Diagnosis present

## 2018-01-08 DIAGNOSIS — Z825 Family history of asthma and other chronic lower respiratory diseases: Secondary | ICD-10-CM | POA: Diagnosis not present

## 2018-01-08 DIAGNOSIS — Z681 Body mass index (BMI) 19 or less, adult: Secondary | ICD-10-CM | POA: Diagnosis not present

## 2018-01-08 DIAGNOSIS — J9621 Acute and chronic respiratory failure with hypoxia: Secondary | ICD-10-CM | POA: Diagnosis present

## 2018-01-08 DIAGNOSIS — E872 Acidosis: Secondary | ICD-10-CM | POA: Diagnosis present

## 2018-01-08 DIAGNOSIS — J9601 Acute respiratory failure with hypoxia: Secondary | ICD-10-CM | POA: Diagnosis present

## 2018-01-08 DIAGNOSIS — Z9911 Dependence on respirator [ventilator] status: Secondary | ICD-10-CM

## 2018-01-08 DIAGNOSIS — Z86711 Personal history of pulmonary embolism: Secondary | ICD-10-CM | POA: Diagnosis not present

## 2018-01-08 DIAGNOSIS — Z4659 Encounter for fitting and adjustment of other gastrointestinal appliance and device: Secondary | ICD-10-CM

## 2018-01-08 LAB — BLOOD GAS, VENOUS
Acid-Base Excess: 0.7 mmol/L (ref 0.0–2.0)
Bicarbonate: 30 mmol/L — ABNORMAL HIGH (ref 20.0–28.0)
Delivery systems: POSITIVE
FIO2: 0.4
O2 Saturation: 29.1 %
PATIENT TEMPERATURE: 37
pCO2, Ven: 70 mmHg — ABNORMAL HIGH (ref 44.0–60.0)
pH, Ven: 7.24 — ABNORMAL LOW (ref 7.250–7.430)
pO2, Ven: <31 mmHg — CL (ref 32.0–45.0)

## 2018-01-08 LAB — COMPREHENSIVE METABOLIC PANEL
ALT: 14 U/L (ref 0–44)
AST: 24 U/L (ref 15–41)
Albumin: 4.4 g/dL (ref 3.5–5.0)
Alkaline Phosphatase: 83 U/L (ref 38–126)
Anion gap: 6 (ref 5–15)
BUN: 14 mg/dL (ref 6–20)
CO2: 29 mmol/L (ref 22–32)
Calcium: 9 mg/dL (ref 8.9–10.3)
Chloride: 107 mmol/L (ref 98–111)
Creatinine, Ser: 1.1 mg/dL (ref 0.61–1.24)
GFR calc Af Amer: >60 mL/min (ref >60)
GFR calc non Af Amer: >60 mL/min (ref >60)
Glucose, Bld: 117 mg/dL — ABNORMAL HIGH (ref 70–99)
Potassium: 4.1 mmol/L (ref 3.5–5.1)
SODIUM: 142 mmol/L (ref 135–145)
Total Bilirubin: 0.5 mg/dL (ref 0.3–1.2)
Total Protein: 8.1 g/dL (ref 6.5–8.1)

## 2018-01-08 LAB — BLOOD GAS, ARTERIAL
Acid-base deficit: 7 mmol/L — ABNORMAL HIGH (ref 0.0–2.0)
Bicarbonate: 21.8 mmol/L (ref 20.0–28.0)
FIO2: 40
MECHVT: 450 mL
Mechanical Rate: 20
O2 Saturation: 96.9 %
PEEP: 5 cmH2O
Patient temperature: 37
pCO2 arterial: 57 mmHg — ABNORMAL HIGH (ref 32.0–48.0)
pH, Arterial: 7.19 — CL (ref 7.350–7.450)
pO2, Arterial: 109 mmHg — ABNORMAL HIGH (ref 83.0–108.0)

## 2018-01-08 LAB — PROTIME-INR
INR: 1.04
Prothrombin Time: 13.5 seconds (ref 11.4–15.2)

## 2018-01-08 LAB — POCT I-STAT, CHEM 8
BUN: 12 mg/dL (ref 6–20)
Calcium, Ion: 1.22 mmol/L (ref 1.15–1.40)
Chloride: 107 mmol/L (ref 98–111)
Creatinine, Ser: 1.1 mg/dL (ref 0.61–1.24)
Glucose, Bld: 109 mg/dL — ABNORMAL HIGH (ref 70–99)
HCT: 44 % (ref 39.0–52.0)
Hemoglobin: 15 g/dL (ref 13.0–17.0)
Potassium: 4.2 mmol/L (ref 3.5–5.1)
Sodium: 144 mmol/L (ref 135–145)
TCO2: 30 mmol/L (ref 22–32)

## 2018-01-08 LAB — CBC WITH DIFFERENTIAL/PLATELET
Abs Immature Granulocytes: 0.03 10*3/uL (ref 0.00–0.07)
BASOS ABS: 0 10*3/uL (ref 0.0–0.1)
Basophils Relative: 0 %
Eosinophils Absolute: 0.1 10*3/uL (ref 0.0–0.5)
Eosinophils Relative: 2 %
HCT: 43.3 % (ref 39.0–52.0)
Hemoglobin: 13.7 g/dL (ref 13.0–17.0)
Immature Granulocytes: 0 %
Lymphocytes Relative: 29 %
Lymphs Abs: 2.5 10*3/uL (ref 0.7–4.0)
MCH: 29 pg (ref 26.0–34.0)
MCHC: 31.6 g/dL (ref 30.0–36.0)
MCV: 91.5 fL (ref 80.0–100.0)
Monocytes Absolute: 0.6 10*3/uL (ref 0.1–1.0)
Monocytes Relative: 7 %
Neutro Abs: 5.3 10*3/uL (ref 1.7–7.7)
Neutrophils Relative %: 62 %
Platelets: 199 10*3/uL (ref 150–400)
RBC: 4.73 MIL/uL (ref 4.22–5.81)
RDW: 13.5 % (ref 11.5–15.5)
WBC: 8.6 10*3/uL (ref 4.0–10.5)
nRBC: 0 % (ref 0.0–0.2)

## 2018-01-08 LAB — GLUCOSE, CAPILLARY: Glucose-Capillary: 203 mg/dL — ABNORMAL HIGH (ref 70–99)

## 2018-01-08 LAB — MAGNESIUM: Magnesium: 1.7 mg/dL (ref 1.7–2.4)

## 2018-01-08 LAB — LACTIC ACID, PLASMA
Lactic Acid, Venous: 0.8 mmol/L (ref 0.5–1.9)
Lactic Acid, Venous: 0.6 mmol/L (ref 0.5–1.9)

## 2018-01-08 LAB — TROPONIN I
Troponin I: <0.03 ng/mL (ref <0.03)
Troponin I: <0.03 ng/mL (ref <0.03)
Troponin I: <0.03 ng/mL (ref <0.03)

## 2018-01-08 LAB — CG4 I-STAT (LACTIC ACID): LACTIC ACID, VENOUS: 0.66 mmol/L (ref 0.5–1.9)

## 2018-01-08 LAB — INFLUENZA PANEL BY PCR (TYPE A & B)
Influenza A By PCR: NEGATIVE
Influenza B By PCR: NEGATIVE

## 2018-01-08 LAB — MRSA PCR SCREENING: MRSA by PCR: NEGATIVE

## 2018-01-08 LAB — ECHOCARDIOGRAM COMPLETE
HEIGHTINCHES: 70 in
Weight: 2560 oz

## 2018-01-08 LAB — PHOSPHORUS: Phosphorus: 4.5 mg/dL (ref 2.5–4.6)

## 2018-01-08 LAB — BRAIN NATRIURETIC PEPTIDE: B Natriuretic Peptide: 203 pg/mL — ABNORMAL HIGH (ref 0.0–100.0)

## 2018-01-08 MED ORDER — ALBUTEROL SULFATE (2.5 MG/3ML) 0.083% IN NEBU
INHALATION_SOLUTION | RESPIRATORY_TRACT | Status: AC
  Filled 2018-01-08: qty 30

## 2018-01-08 MED ORDER — NITROGLYCERIN IN D5W 200-5 MCG/ML-% IV SOLN
0.0000 ug/min | INTRAVENOUS | Status: DC
  Administered 2018-01-08: 20 ug/min via INTRAVENOUS

## 2018-01-08 MED ORDER — SODIUM CHLORIDE 0.9 % IV BOLUS
1000.0000 mL | Freq: Once | INTRAVENOUS | Status: AC
  Administered 2018-01-08: 1000 mL via INTRAVENOUS

## 2018-01-08 MED ORDER — SUCCINYLCHOLINE CHLORIDE 20 MG/ML IJ SOLN
160.0000 mg | Freq: Once | INTRAMUSCULAR | Status: AC
  Administered 2018-01-08: 160 mg via INTRAVENOUS

## 2018-01-08 MED ORDER — ORAL CARE MOUTH RINSE
15.0000 mL | OROMUCOSAL | Status: DC
  Administered 2018-01-08 – 2018-01-09 (×10): 15 mL via OROMUCOSAL

## 2018-01-08 MED ORDER — NITROGLYCERIN IN D5W 200-5 MCG/ML-% IV SOLN
INTRAVENOUS | Status: AC
  Filled 2018-01-08: qty 250

## 2018-01-08 MED ORDER — CHLORHEXIDINE GLUCONATE 0.12% ORAL RINSE (MEDLINE KIT)
15.0000 mL | Freq: Two times a day (BID) | OROMUCOSAL | Status: DC
  Administered 2018-01-08 – 2018-01-09 (×3): 15 mL via OROMUCOSAL

## 2018-01-08 MED ORDER — FENTANYL CITRATE (PF) 100 MCG/2ML IJ SOLN
200.0000 ug | Freq: Once | INTRAMUSCULAR | Status: AC
  Administered 2018-01-08: 200 ug via INTRAVENOUS

## 2018-01-08 MED ORDER — IPRATROPIUM-ALBUTEROL 0.5-2.5 (3) MG/3ML IN SOLN
RESPIRATORY_TRACT | Status: AC
  Filled 2018-01-08: qty 6

## 2018-01-08 MED ORDER — PROPOFOL 1000 MG/100ML IV EMUL
5.0000 ug/kg/min | INTRAVENOUS | Status: DC
  Administered 2018-01-08: 20 ug/kg/min via INTRAVENOUS
  Filled 2018-01-08: qty 100

## 2018-01-08 MED ORDER — IPRATROPIUM-ALBUTEROL 0.5-2.5 (3) MG/3ML IN SOLN
3.0000 mL | RESPIRATORY_TRACT | Status: DC
  Administered 2018-01-08 – 2018-01-11 (×17): 3 mL via RESPIRATORY_TRACT
  Filled 2018-01-08 (×19): qty 3

## 2018-01-08 MED ORDER — APIXABAN 5 MG PO TABS
5.0000 mg | ORAL_TABLET | Freq: Two times a day (BID) | ORAL | Status: DC
  Administered 2018-01-08 – 2018-01-12 (×9): 5 mg via ORAL
  Filled 2018-01-08 (×9): qty 1

## 2018-01-08 MED ORDER — MAGNESIUM SULFATE 2 GM/50ML IV SOLN
2.0000 g | Freq: Once | INTRAVENOUS | Status: AC
  Administered 2018-01-08: 2 g via INTRAVENOUS

## 2018-01-08 MED ORDER — PROPOFOL 10 MG/ML IV BOLUS
50.0000 mg | Freq: Once | INTRAVENOUS | Status: AC
  Administered 2018-01-08: 50 mg via INTRAVENOUS

## 2018-01-08 MED ORDER — ALBUTEROL SULFATE (2.5 MG/3ML) 0.083% IN NEBU
10.0000 mg | INHALATION_SOLUTION | Freq: Once | RESPIRATORY_TRACT | Status: AC
  Administered 2018-01-08: 10 mg via RESPIRATORY_TRACT

## 2018-01-08 MED ORDER — ATORVASTATIN CALCIUM 20 MG PO TABS
20.0000 mg | ORAL_TABLET | Freq: Every day | ORAL | Status: DC
  Administered 2018-01-08 – 2018-01-11 (×4): 20 mg via ORAL
  Filled 2018-01-08 (×4): qty 1

## 2018-01-08 MED ORDER — METHYLPREDNISOLONE SODIUM SUCC 125 MG IJ SOLR
60.0000 mg | Freq: Two times a day (BID) | INTRAMUSCULAR | Status: AC
  Administered 2018-01-08 – 2018-01-09 (×3): 60 mg via INTRAVENOUS
  Filled 2018-01-08 (×3): qty 2

## 2018-01-08 MED ORDER — ASPIRIN 300 MG RE SUPP
300.0000 mg | Freq: Once | RECTAL | Status: AC
  Administered 2018-01-08: 300 mg via RECTAL

## 2018-01-08 MED ORDER — PREDNISONE 20 MG PO TABS
40.0000 mg | ORAL_TABLET | Freq: Every day | ORAL | Status: DC
  Administered 2018-01-10 – 2018-01-12 (×3): 40 mg via ORAL
  Filled 2018-01-08 (×3): qty 2

## 2018-01-08 MED ORDER — DEXAMETHASONE SODIUM PHOSPHATE 10 MG/ML IJ SOLN
10.0000 mg | Freq: Once | INTRAMUSCULAR | Status: AC
  Administered 2018-01-08: 10 mg via INTRAVENOUS
  Filled 2018-01-08: qty 1

## 2018-01-08 MED ORDER — MAGNESIUM SULFATE 2 GM/50ML IV SOLN
INTRAVENOUS | Status: AC
  Filled 2018-01-08: qty 50

## 2018-01-08 MED ORDER — NOREPINEPHRINE BITARTRATE 1 MG/ML IV SOLN
0.0000 ug/min | INTRAVENOUS | Status: DC
  Administered 2018-01-08: 5 ug/min via INTRAVENOUS
  Filled 2018-01-08 (×2): qty 4

## 2018-01-08 MED ORDER — ASPIRIN 81 MG PO CHEW
81.0000 mg | CHEWABLE_TABLET | Freq: Every day | ORAL | Status: DC
  Administered 2018-01-08 – 2018-01-12 (×5): 81 mg via ORAL
  Filled 2018-01-08 (×5): qty 1

## 2018-01-08 MED ORDER — MIDAZOLAM HCL 2 MG/2ML IJ SOLN
2.0000 mg | INTRAMUSCULAR | Status: DC | PRN
  Administered 2018-01-08 – 2018-01-09 (×4): 2 mg via INTRAVENOUS
  Filled 2018-01-08 (×4): qty 2

## 2018-01-08 MED ORDER — FENTANYL CITRATE (PF) 100 MCG/2ML IJ SOLN
50.0000 ug | Freq: Once | INTRAMUSCULAR | Status: AC
  Administered 2018-01-08: 50 ug via INTRAVENOUS

## 2018-01-08 MED ORDER — FENTANYL 2500MCG IN NS 250ML (10MCG/ML) PREMIX INFUSION
0.0000 ug/h | INTRAVENOUS | Status: DC
  Administered 2018-01-08: 125 ug/h via INTRAVENOUS
  Administered 2018-01-08: 150 ug/h via INTRAVENOUS
  Administered 2018-01-09: 225 ug/h via INTRAVENOUS
  Filled 2018-01-08 (×3): qty 250

## 2018-01-08 MED ORDER — METHYLPREDNISOLONE SODIUM SUCC 125 MG IJ SOLR
INTRAMUSCULAR | Status: AC
  Filled 2018-01-08: qty 2

## 2018-01-08 MED ORDER — KETAMINE HCL 10 MG/ML IJ SOLN
160.0000 mg | Freq: Once | INTRAMUSCULAR | Status: AC
  Administered 2018-01-08: 160 mg via INTRAVENOUS

## 2018-01-08 MED ORDER — SODIUM CHLORIDE 0.9 % IV SOLN
500.0000 mg | INTRAVENOUS | Status: DC
  Administered 2018-01-08 – 2018-01-10 (×3): 500 mg via INTRAVENOUS
  Filled 2018-01-08 (×3): qty 500

## 2018-01-08 MED ORDER — PROPOFOL 10 MG/ML IV BOLUS
80.0000 mg | Freq: Once | INTRAVENOUS | Status: AC
  Administered 2018-01-08: 80 mg via INTRAVENOUS

## 2018-01-08 MED ORDER — ASPIRIN 300 MG RE SUPP
RECTAL | Status: AC
  Filled 2018-01-08: qty 1

## 2018-01-08 MED ORDER — DEXAMETHASONE SODIUM PHOSPHATE 10 MG/ML IJ SOLN: 10.0000 mg | Freq: Once | INTRAMUSCULAR | Status: DC

## 2018-01-08 MED ORDER — PANTOPRAZOLE SODIUM 40 MG IV SOLR
40.0000 mg | Freq: Every day | INTRAVENOUS | Status: DC
  Administered 2018-01-08 – 2018-01-09 (×2): 40 mg via INTRAVENOUS
  Filled 2018-01-08 (×2): qty 40

## 2018-01-08 MED ORDER — NOREPINEPHRINE 4 MG/250ML-% IV SOLN
0.0000 ug/min | INTRAVENOUS | Status: DC
  Administered 2018-01-08: 20 ug/min via INTRAVENOUS
  Filled 2018-01-08 (×2): qty 250

## 2018-01-08 MED ORDER — PROPOFOL 1000 MG/100ML IV EMUL
INTRAVENOUS | Status: AC
  Filled 2018-01-08: qty 100

## 2018-01-08 MED ORDER — METHYLPREDNISOLONE SODIUM SUCC 125 MG IJ SOLR: 125.0000 mg | Freq: Once | INTRAMUSCULAR | Status: DC

## 2018-01-08 MED FILL — Medication: Qty: 1 | Status: AC

## 2018-01-08 NOTE — Progress Notes (Signed)
*  PRELIMINARY RESULTS* Echocardiogram 2D Echocardiogram has been performed.  Christopher Bryan, Christopher Bryan 01/08/2018, 12:58 PM

## 2018-01-08 NOTE — ED Notes (Signed)
Pt requesting family Christopher Bryan be telephoned at [phone] prior to intubation, no answer when this rn attempted to call multiple times

## 2018-01-08 NOTE — Consult Note (Signed)
Reason for Consult: Assistance with ventilator management and patient with acute respiratory failure, hypoxemic.   Referring Physician: [name] is an 57 y.o. male.  HPI:  Patient is a 57 year old African-American gentleman, current smoker, who presented to the emergency room today with dyspnea.  The patient was in respiratory distress when he was seen in the emergency room was not moving any air and was intubated.  No further history can be obtained.  He was initially hypertensive with systolic blood pressures in the 220 range however after intubation he became hypotensive.  He is now requiring IV fluids and pressors.  We are asked to assist with management of the ventilator and critical care issues.  As noted no further history can be obtained and there is no family available to discuss the case with.   Past Medical History:  Diagnosis Date  . Hypertension     Past Surgical History:  Procedure Laterality Date  . KNEE ARTHROSCOPY Left   . ORIF FEMUR FRACTURE Right     Family History  Problem Relation Age of Onset  . CAD Mother   . Emphysema Father     Social History:  reports that he has been smoking. He has never used smokeless tobacco. He reports that he drinks alcohol. He reports that he does not use drugs.  This history obtained from old records.  Allergies:  Allergies  Allergen Reactions  . Ivp Dye [Iodinated Diagnostic Agents] Hives    Medications: I have reviewed the patient's current medications.  Results for orders placed or performed during the hospital encounter of 01/08/18 (from the past 48 hour(s))  Comprehensive metabolic panel     Status: Abnormal   Collection Time: 01/08/18  4:45 AM  Result Value Ref Range   Sodium 142 135 - 145 mmol/L   Potassium 4.1 3.5 - 5.1 mmol/L   Chloride 107 98 - 111 mmol/L   CO2 29 22 - 32 mmol/L   Glucose, Bld 117 (H) 70 - 99 mg/dL   BUN 14 6 - 20 mg/dL   Creatinine, Ser 1.61 0.61 - 1.24 mg/dL   Calcium 9.0  8.9 - 09.6 mg/dL   Total Protein 8.1 6.5 - 8.1 g/dL   Albumin 4.4 3.5 - 5.0 g/dL   AST 24 15 - 41 U/L   ALT 14 0 - 44 U/L   Alkaline Phosphatase 83 38 - 126 U/L   Total Bilirubin 0.5 0.3 - 1.2 mg/dL   GFR calc non Af Amer >60 >60 mL/min   GFR calc Af Amer >60 >60 mL/min   Anion gap 6 5 - 15    Comment: Performed at [facility], [address], [place], [place] [address]  Brain natriuretic peptide     Status: Abnormal   Collection Time: 01/08/18  4:45 AM  Result Value Ref Range   B Natriuretic Peptide 203.0 (H) 0.0 - 100.0 pg/mL    Comment: Performed at [facility], [address] [place], [place] [address]  Troponin I - Once     Status: None   Collection Time: 01/08/18  4:45 AM  Result Value Ref Range   Troponin I <0.03 <0.03 ng/mL    Comment: Performed at [facility], [address] [place], [place] [address]  CBC with Differential     Status: None   Collection Time: 01/08/18  4:45 AM  Result Value Ref Range   WBC 8.6 4.0 - 10.5 K/uL   RBC 4.73 4.22 - 5.81  MIL/uL   Hemoglobin 13.7 13.0 - 17.0 g/dL   HCT 60.4 54.0 - 98.1 %   MCV 91.5 80.0 - 100.0 fL   MCH 29.0 26.0 - 34.0 pg   MCHC 31.6 30.0 - 36.0 g/dL   RDW 19.1 47.8 - 29.5 %   Platelets 199 150 - 400 K/uL   nRBC 0.0 0.0 - 0.2 %   Neutrophils Relative % 62 %   Neutro Abs 5.3 1.7 - 7.7 K/uL   Lymphocytes Relative 29 %   Lymphs Abs 2.5 0.7 - 4.0 K/uL   Monocytes Relative 7 %   Monocytes Absolute 0.6 0.1 - 1.0 K/uL   Eosinophils Relative 2 %   Eosinophils Absolute 0.1 0.0 - 0.5 K/uL   Basophils Relative 0 %   Basophils Absolute 0.0 0.0 - 0.1 K/uL   Immature Granulocytes 0 %   Abs Immature Granulocytes 0.03 0.00 - 0.07 K/uL    Comment: Performed at [facility], [address] [place], [place] [address]  Influenza panel by PCR (type A & B)     Status: None   Collection Time: 01/08/18  4:45 AM  Result Value Ref Range   Influenza A By PCR NEGATIVE NEGATIVE   Influenza B By  PCR NEGATIVE NEGATIVE    Comment: (NOTE) The Xpert Xpress Flu assay is intended as an aid in the diagnosis of  influenza and should not be used as a sole basis for treatment.  This  assay is FDA approved for nasopharyngeal swab specimens only. Nasal  washings and aspirates are unacceptable for Xpert Xpress Flu testing. Performed at [facility], [address] [place], [place] [address]   Lactic acid, plasma     Status: None   Collection Time: 01/08/18  4:45 AM  Result Value Ref Range   Lactic Acid, Venous 0.8 0.5 - 1.9 mmol/L    Comment: Performed at [facility], [address] [place], [place] [address]  Magnesium     Status: None   Collection Time: 01/08/18  4:45 AM  Result Value Ref Range   Magnesium 1.7 1.7 - 2.4 mg/dL    Comment: Performed at [facility], [address] [place], [place] [address]  Phosphorus     Status: None   Collection Time: 01/08/18  4:45 AM  Result Value Ref Range   Phosphorus 4.5 2.5 - 4.6 mg/dL    Comment: Performed at [facility], [address] [place], [place] [address]  Blood gas, venous     Status: Abnormal   Collection Time: 01/08/18  4:50 AM  Result Value Ref Range   FIO2 0.40    Delivery systems BILEVEL POSITIVE AIRWAY PRESSURE    pH, Ven 7.24 (L) 7.250 - 7.430   pCO2, Ven 70 (H) 44.0 - 60.0 mmHg   pO2, Ven <31.0 (LL) 32.0 - 45.0 mmHg   Bicarbonate 30.0 (H) 20.0 - 28.0 mmol/L   Acid-Base Excess 0.7 0.0 - 2.0 mmol/L   O2 Saturation 29.1 %   Patient temperature 37.0    Collection site RIGHT RADIAL    Sample type VENOUS     Comment: Performed at [facility], [address] [place], [place] [address]  I-STAT, [place] 8     Status: Abnormal   Collection Time: 01/08/18  5:02 AM  Result Value Ref Range   Sodium 144 135 - 145 mmol/L   Potassium 4.2 3.5 - 5.1 mmol/L   Chloride 107 98 - 111 mmol/L  BUN 12 6 - 20 mg/dL   Creatinine, Ser 4.09 0.61 - 1.24 mg/dL   Glucose, Bld 811 (H) 70 -  99 mg/dL   Calcium, Ion 9.14 7.82 - 1.40 mmol/L   TCO2 30 22 - 32 mmol/L   Hemoglobin 15.0 13.0 - 17.0 g/dL   HCT 95.6 21.3 - 08.6 %  CG4 I-STAT (Lactic acid)     Status: None   Collection Time: 01/08/18  5:04 AM  Result Value Ref Range   Lactic Acid, Venous 0.66 0.5 - 1.9 mmol/L  Protime-INR     Status: None   Collection Time: 01/08/18  5:35 AM  Result Value Ref Range   Prothrombin Time 13.5 11.4 - 15.2 seconds   INR 1.04     Comment: Performed at [facility], [address] [place], [place] [address]  Blood gas, arterial     Status: Abnormal   Collection Time: 01/08/18  5:45 AM  Result Value Ref Range   FIO2 40.00    Delivery systems VENTILATOR    Mode ASSIST CONTROL    VT 450 mL   Peep/cpap 5.0 cm H20   pH, Arterial 7.19 (LL) 7.350 - 7.450   pCO2 arterial 57 (H) 32.0 - 48.0 mmHg   pO2, Arterial 109 (H) 83.0 - 108.0 mmHg   Bicarbonate 21.8 20.0 - 28.0 mmol/L   Acid-base deficit 7.0 (H) 0.0 - 2.0 mmol/L   O2 Saturation 96.9 %   Patient temperature 37.0    Collection site RIGHT RADIAL    Sample type ARTERIAL DRAW    Allens test (pass/fail) PASS PASS   Mechanical Rate 20     Comment: Performed at [facility], [address] [place], [place] [address]  Lactic acid, plasma     Status: None   Collection Time: 01/08/18  7:02 AM  Result Value Ref Range   Lactic Acid, Venous 0.6 0.5 - 1.9 mmol/L    Comment: Performed at [facility], [address] [place], [place] [address]  Troponin I - Once     Status: None   Collection Time: 01/08/18  8:02 AM  Result Value Ref Range   Troponin I <0.03 <0.03 ng/mL    Comment: Performed at [facility], [address] [place], [place] [address]  Glucose, capillary     Status: Abnormal   Collection Time: 01/08/18 10:30 AM  Result Value Ref Range   Glucose-Capillary 203 (H) 70 - 99 mg/dL  MRSA PCR Screening     Status: None   Collection Time: 01/08/18 10:33 AM  Result Value Ref Range   MRSA by  PCR NEGATIVE NEGATIVE    Comment:        The GeneXpert MRSA Assay (FDA approved for NASAL specimens only), is one component of a comprehensive MRSA colonization surveillance program. It is not intended to diagnose MRSA infection nor to guide or monitor treatment for MRSA infections. Performed at [facility], [address] [place], [place] [address]   Troponin I - Once     Status: None   Collection Time: 01/08/18 10:55 AM  Result Value Ref Range   Troponin I <0.03 <0.03 ng/mL    Comment: Performed at [facility], [address] [place], [place] [address]    Dg Chest 1 View  Result Date: 01/08/2018 CLINICAL DATA:  57 year old male with intubation. EXAM: CHEST  1 VIEW COMPARISON:  Earlier radiograph dated 01/08/2018 FINDINGS: Interval placement of an endotracheal tube with tip approximately 4  cm above the carina. An enteric tube extends in the left upper abdomen with tip likely in the proximal stomach. The lungs are clear. There is no pleural effusion or pneumothorax. The cardiac silhouette is within normal limits. Mild atherosclerotic calcification of the aorta. No acute osseous pathology. IMPRESSION: Endotracheal tube above the carina and enteric tube likely in the stomach. Electronically Signed   By: Elgie Collard M.D.   On: 01/08/2018 06:02   Ct Head Wo Contrast  Result Date: 01/08/2018 CLINICAL DATA:  Altered mental status EXAM: CT HEAD WITHOUT CONTRAST TECHNIQUE: Contiguous axial images were obtained from the base of the skull through the vertex without intravenous contrast. COMPARISON:  July 30, 2008 FINDINGS: Brain: The ventricles are normal in size configuration. There is no intracranial mass, hemorrhage, extra-axial fluid collection, or midline shift. There is slight small vessel disease in the centra semiovale bilaterally. Brain parenchyma elsewhere appears unremarkable. No acute infarct evident. Vascular: There is no appreciable hyperdense vessel. There  is a questionable aneurysm arising from the proximal A1 segment of the right middle cerebral artery measuring 4 mm, best seen on axial slice 14 series 2. There is calcification in each carotid siphon region. Skull: The bony calvarium appears intact. Sinuses/Orbits: There is opacification throughout multiple ethmoid air cells and in each maxillary antrum. There is edema the nasal turbinates. Note that patient is intubated. No intraorbital lesions are evident. Other: Mastoid air cells are clear. IMPRESSION: 1. Questionable 4 mm aneurysm arising from the proximal M1 segment of the right middle cerebral artery. This finding may warrant nonemergent CT angiography for further assessment when patient is clinically able. There are foci of arterial vascular calcification noted. 2. Mild periventricular small vessel disease noted. No acute infarct evident. No mass or hemorrhage. 3.  Multifocal paranasal sinus disease. Electronically Signed   By: Bretta Bang III M.D.   On: 01/08/2018 07:04   Dg Chest Port 1 View  Result Date: 01/08/2018 CLINICAL DATA:  57 year old male with shortness of breath. EXAM: PORTABLE CHEST 1 VIEW COMPARISON:  Chest radiograph dated 06/22/2016 FINDINGS: Minimal left lung base atelectatic changes. Developing infiltrate is not excluded. Clinical correlation is recommended. The right lung is clear. There is no pleural effusion or pneumothorax. The costophrenic angles have been excluded from the image. The cardiac silhouette is within normal limits. Atherosclerotic calcification of the aortic arch. No acute osseous pathology. IMPRESSION: Minimal left lung base minimal atelectatic versus less likely developing infiltrate. Electronically Signed   By: Elgie Collard M.D.   On: 01/08/2018 05:32    Review of Systems  Unable to perform ROS: Intubated   Blood pressure (!) 93/54, pulse (!) 49, temperature 97.8 F (36.6 C), temperature source Oral, resp. rate 18, height 5\' 10"  (1.778 m), weight  72.6 kg, SpO2 100 %. Physical Exam  Constitutional: He appears well-developed and well-nourished. He is sedated and intubated.  HENT:  Head: Normocephalic and atraumatic.  Right Ear: External ear normal.  Left Ear: External ear normal.  Mouth/Throat: Mucous membranes are normal.  Orotracheally intubated, mucosal membranes normal.  Eyes: Pupils are equal, round, and reactive to light. Conjunctivae are normal. No scleral icterus.  Neck: Neck supple. No JVD present. No tracheal deviation present. No thyromegaly present.  Cardiovascular: Regular rhythm, S1 normal, S2 normal and intact distal pulses. Bradycardia present. Exam reveals no gallop.  No murmur heard. Patient was bradycardic while on propofol, this was discontinued and rate became normal.  Respiratory: He is intubated.  Synchronous with the ventilator, diffuse distant  breath sounds.  Faint end expiratory wheezes diffusely with prolonged I to E ratio 1:3.  Few scattered rhonchi  GI: Soft. Bowel sounds are normal. He exhibits no distension.  Genitourinary: Penis normal.  Musculoskeletal: He exhibits no edema or deformity.  Lymphadenopathy:    He has no cervical adenopathy.  Neurological:  Sedated unable to assess however moves all 4 spontaneously.  Skin: Skin is warm, dry and intact.  Psychiatric:  Sedated unable to assess.    Assessment/Plan:  1.  Acute ventilator dependent respiratory failure, both hypoxic and hypercarbic.  Appears to be due to acute exacerbation of COPD.  No overt evidence of pneumonia however cannot exclude potential left lower lobe patchy infiltrate.  Continue supportive care.  Wake up assessment and SBT trials in the morning as tolerated.  Discussed ventilator settings with respiratory therapy.  2.  COPD by clinical impression, appears to be advanced.  Agree with bronchodilators, IV steroids, pulmonary toilet.  3.  Cannot exclude pneumonia versus bronchitis.  Favor bronchitis.  Agree with  Azithromycin.  4.  DVT in GI prophylaxis will be instituted.  5.  Bradycardia due to propofol, we will keep the patient on fentanyl IV and use Versed pushes as needed.  6.  Hypotension likely due to volume depletion aggravated by sedatives.  Volume resuscitate, pressors as necessary.  7.  Further recommendations pending patient's response to the above please see orders for details.   Total critical care time: 45 minutes  Sarina SerCarmen Mirza Fessel 01/08/2018, 6:52 PM

## 2018-01-08 NOTE — Progress Notes (Signed)
Same day rounding progress note  59M acute on chronic hypoxemic hypercapnoeic respiratory failure, respiratory acidosis, acute COPD exacerbation, intubated in ED. Admitted to ICU  * Acute on chronic hypoxemic hypercapnoeic respiratory failure, respiratory acidosis, acute COPD exacerbation - vent mgmt per PCCM - echo wnl  * Hypotension with initially uncontrolled HTN in ED - Monitor in CCU, on levophed  Time spent: 10 mins

## 2018-01-08 NOTE — ED Notes (Signed)
Rt notified of need for abg and ct

## 2018-01-08 NOTE — ED Notes (Signed)
Start propofol at 4620mcg/kg/min per dr. Lamont Snowballrifenbark

## 2018-01-08 NOTE — H&P (Signed)
[facility] [place] [facility]   PATIENT NAME: Christopher SchmidtJames Bryan    MR#:  [id number]  DATE OF BIRTH:  07-10-1960  DATE OF ADMISSION:  01/08/2018  PRIMARY CARE PHYSICIAN: Patient, No Pcp Per   REQUESTING/REFERRING PHYSICIAN: Merrily Brittleifenbark, Neil, MD  CHIEF COMPLAINT:   Chief Complaint  Patient presents with  . Respiratory Distress    HISTORY OF PRESENT ILLNESS:  Christopher SchmidtJames Bryan  is a 57 y.o. male with a known history of HTN, COPD, questionable CHF, PE (Eliquis) p/w acute hypoxemic respiratory failure. Pt intubated/sedated prior to my assessment; Hx/ROS unobtainable. Per ED staff/provider and documentation, pt presented to ED in severe respiratory distress, SBP 220s, (+) JVD. Tight/poor air movement, lungs diminished but clear. Does not appear volume overloaded. He has a bottle of Eliquis in his bag.  While in ED, pt went from hypertensive to hypotensive, and was started on Levophed.  PAST MEDICAL HISTORY:   Past Medical History:  Diagnosis Date  . Hypertension     PAST SURGICAL HISTORY:   Past Surgical History:  Procedure Laterality Date  . KNEE ARTHROSCOPY Left   . ORIF FEMUR FRACTURE Right     SOCIAL HISTORY:   Social History   Tobacco Use  . Smoking status: Current Some Day Smoker  . Smokeless tobacco: Never Used  Substance Use Topics  . Alcohol use: Yes    FAMILY HISTORY:   Family History  Problem Relation Age of Onset  . CAD Mother   . Emphysema Father     DRUG ALLERGIES:   Allergies  Allergen Reactions  . Ivp Dye [Iodinated Diagnostic Agents] Hives    REVIEW OF SYSTEMS:   Review of Systems  Unable to perform ROS: Intubated  Respiratory: Positive for shortness of breath.    Intubated/sedated. MEDICATIONS AT HOME:   Prior to Admission medications   Medication Sig Start Date End Date Taking? Authorizing Provider  acetaminophen (TYLENOL) 325 MG tablet Take 650 mg by mouth 3 (three) times daily as needed for mild pain or  fever.    [provider]  aspirin EC 81 MG EC tablet Take 1 tablet (81 mg total) by mouth daily. 06/24/16   Enid BaasKalisetti, Radhika, MD  atorvastatin (LIPITOR) 20 MG tablet Take 20 mg by mouth at bedtime.    [provider]  budesonide-formoterol (SYMBICORT) 160-4.5 MCG/ACT inhaler Inhale 2 puffs into the lungs 2 (two) times daily. 06/23/16   Enid BaasKalisetti, Radhika, MD  carvedilol (COREG) 12.5 MG tablet Take 12.5 mg by mouth 2 (two) times daily with a meal.    [provider]  cholecalciferol (VITAMIN D) 1000 units tablet Take 2,000 Units by mouth daily.    [provider]  doxycycline (VIBRA-TABS) 100 MG tablet Take 1 tablet (100 mg total) by mouth every 12 (twelve) hours. X 5 more days 06/23/16   Enid BaasKalisetti, Radhika, MD  gabapentin (NEURONTIN) 800 MG tablet Take 400 mg by mouth 2 (two) times daily.    [provider]  ipratropium-albuterol (DUONEB) 0.5-2.5 (3) MG/3ML SOLN Take 3 mLs by nebulization every 6 (six) hours as needed. 06/23/16   Enid BaasKalisetti, Radhika, MD  lisinopril (PRINIVIL,ZESTRIL) 10 MG tablet Take 10 mg by mouth daily.    [provider]  loratadine (CLARITIN) 10 MG tablet Take 10 mg by mouth daily as needed for allergies.    [provider]  nicotine (NICODERM CQ - DOSED IN MG/24 HOURS) 21 mg/24hr patch Place 1 patch (21 mg total) onto the skin daily. 06/24/16  Enid Baas, MD  predniSONE (STERAPRED UNI-PAK 21 TAB) 10 MG (21) TBPK tablet 6 tabs PO x 1 day 5 tabs PO x 1 day 4 tabs PO x 1 day 3 tabs PO x 1 day 2 tabs PO x 1 day 1 tab PO x 1 day and stop 06/23/16   Enid Baas, MD  traMADol (ULTRAM) 50 MG tablet Take 50 mg by mouth 2 (two) times daily as needed for moderate pain.    [provider]  traZODone (DESYREL) 50 MG tablet Take 25 mg by mouth at bedtime.    [provider]  warfarin (COUMADIN) 5 MG tablet Take 2.5-5 mg by mouth daily. Take 2.5 mg on Wednesday and Friday. Take 5 mg all other  days.    [provider]      VITAL SIGNS:  Blood pressure 102/64, pulse 69, resp. rate (!) 23, height 5\' 10"  (1.778 m), weight 72.6 kg, SpO2 99 %.  PHYSICAL EXAMINATION:  Physical Exam  Constitutional: He appears well-developed and well-nourished. He is sleeping.  Non-toxic appearance. He does not have a sickly appearance. He is sedated and intubated.  HENT:  Head: Atraumatic.  Mouth/Throat: Oropharynx is clear and moist. No oropharyngeal exudate.  Eyes: Conjunctivae and lids are normal. No scleral icterus.  Neck: Neck supple. JVD present. No thyromegaly present.  Cardiovascular: Normal rate, regular rhythm, S1 normal, S2 normal and normal heart sounds.  No extrasystoles are present. Exam reveals no gallop, no S3, no S4, no distant heart sounds and no friction rub.  No murmur heard. Pulmonary/Chest: No accessory muscle usage or stridor. Tachypnea noted. No apnea and no bradypnea. He is intubated. He has decreased breath sounds in the right upper field, the right middle field, the right lower field, the left upper field, the left middle field and the left lower field. He has no wheezes. He has no rhonchi. He has no rales.  Abdominal: Soft. He exhibits no distension. Bowel sounds are decreased. There is no tenderness. There is no rigidity, no rebound and no guarding.  Musculoskeletal: He exhibits no edema.  Lymphadenopathy:    He has no cervical adenopathy.  Neurological: He is unresponsive.  Intubated/sedated.  Skin: Skin is warm and dry. No rash noted. He is not diaphoretic. No erythema.  Psychiatric:  Intubated/sedated.   Diffusely diminished w/ poor air movement, (-) wheezing, (-) rales. Heart sounds normal. (-) leg edema, (-) anasarca. LABORATORY PANEL:   CBC Recent Labs  Lab 01/08/18 0445 01/08/18 0502  WBC 8.6  --   HGB 13.7 15.0  HCT 43.3 44.0  PLT 199  --     ------------------------------------------------------------------------------------------------------------------  Chemistries  Recent Labs  Lab 01/08/18 0445 01/08/18 0502  NA 142 144  K 4.1 4.2  CL 107 107  CO2 29  --   GLUCOSE 117* 109*  BUN 14 12  CREATININE 1.10 1.10  CALCIUM 9.0  --   MG 1.7  --   AST 24  --   ALT 14  --   ALKPHOS 83  --   BILITOT 0.5  --    ------------------------------------------------------------------------------------------------------------------  Cardiac Enzymes Recent Labs  Lab 01/08/18 0445  TROPONINI <0.03   ------------------------------------------------------------------------------------------------------------------  RADIOLOGY:  Dg Chest 1 View  Result Date: 01/08/2018 CLINICAL DATA:  57 year old male with intubation. EXAM: CHEST  1 VIEW COMPARISON:  Earlier radiograph dated 01/08/2018 FINDINGS: Interval placement of an endotracheal tube with tip approximately 4 cm above the carina. An enteric tube extends in the left upper abdomen with  tip likely in the proximal stomach. The lungs are clear. There is no pleural effusion or pneumothorax. The cardiac silhouette is within normal limits. Mild atherosclerotic calcification of the aorta. No acute osseous pathology. IMPRESSION: Endotracheal tube above the carina and enteric tube likely in the stomach. Electronically Signed   By: Elgie Collard M.D.   On: 01/08/2018 06:02   Dg Chest Port 1 View  Result Date: 01/08/2018 CLINICAL DATA:  57 year old male with shortness of breath. EXAM: PORTABLE CHEST 1 VIEW COMPARISON:  Chest radiograph dated 06/22/2016 FINDINGS: Minimal left lung base atelectatic changes. Developing infiltrate is not excluded. Clinical correlation is recommended. The right lung is clear. There is no pleural effusion or pneumothorax. The costophrenic angles have been excluded from the image. The cardiac silhouette is within normal limits. Atherosclerotic calcification of the  aortic arch. No acute osseous pathology. IMPRESSION: Minimal left lung base minimal atelectatic versus less likely developing infiltrate. Electronically Signed   By: Elgie Collard M.D.   On: 01/08/2018 05:32   IMPRESSION AND PLAN:   A/P: 52M acute on chronic hypoxemic hypercapnoeic respiratory failure, respiratory acidosis, acute COPD exacerbation, intubated. Hypertensive urgency, hypotension. Hyperglycemia, BNP elevation. -Acute on chronic hypoxemic hypercapnoeic respiratory failure, respiratory acidosis, acute COPD exacerbation, intubated: p/w respiratory distress. (-) wheezing, (+) diminished, (+) tight/poor air movement. Intubated in ED. CXR w/ L lung base atelectasis (vs. developing infiltrate). Afebrile, WBC 8.6, Lactate 0.66, does not appear toxic. Nebs, steroids (IV, transition to PO), Azithromycin (indicated in moderate/severe COPD exacerbation even in absence of infxn), O2. Pulmonary toileting. PH 7.19, pCO2 57, pO2 109, Bicarb 21.8. Respiratory acidosis w/o metabolic compensation, possible concomitant non anion gap metabolic acidosis. IVF, monitor BMP. Trop-I (-), rpt pending. Concern for cardiac pathology exists. [id number], c/w Eliquis and Statin. Echo pending. Continuous cardiac monitoring + pulse ox, admit to ICU. -Hypertensive urgency, BNP elevation, hypotension: SBP 220s on arrival to ED. Presentation was initially concerning for flash pulmonary edema 2/2 high afterload; however, pt not in failure, lungs clear. BNP elevation likely 2/2 high afterload, LVH, demand. BP improved on NTG gtt, then subsequently bottomed out. Bolus IVF. Pt was started on Levophed. CT head (-) bleed or acute stroke. Holding home antihypertensives. -Holding majority of home medications. -FEN/GI: NPO. -DVT PPx: Eliquis. -Code status: Full code. -Disposition: Admission, > 2 midnights.    All the records are reviewed and case discussed with ED provider. Management plans discussed with the patient, family and they  are in agreement.  CODE STATUS: Full code.  TOTAL TIME TAKING CARE OF THIS PATIENT: 75 minutes.    Barbaraann Rondo M.D on 01/08/2018 at 6:16 AM  Between 7am to 6pm - Pager - [phone]  After 6pm go to [email or web address] - Social research officer, government  [facility] [place] [facility]  Office  [phone]  CC: Primary care physician; Patient, No Pcp Per   Note: This dictation was prepared with Dragon dictation along with smaller phrase technology. Any transcriptional errors that result from this process are unintentional.

## 2018-01-08 NOTE — ED Notes (Signed)
Per Fleet Contrasachel RN in ICU pts room in ICU is not ready, vent is not set up. Fleet Contrasachel RN calling this RN when room is ready.

## 2018-01-08 NOTE — Progress Notes (Signed)
Patient initially started on bipap 12/8 r12 50% due to respiratory distress. Patient subsequently intubated per Dr Lamont Snowballifenbark.

## 2018-01-08 NOTE — Progress Notes (Signed)
   01/08/18 1000  Vitals  BP (!) 156/73  MAP (mmHg) 96  Pulse Rate (!) 48  ECG Heart Rate (!) 48  Resp (!) 24  Oxygen Therapy  SpO2 99 %   Patient admitted from ED to ICU 6. Patient intubated FiO2 40%. 3 PIVs to right arm, 1 PIV to left arm. Levophed infusing at 20mcgmin, propofol at 4850mcg/kg/min and fentanyl at 2500mcg/hr. See above vital signs. Levophed titrated for MAP >65 and propofol decreased due to bradycardia. MD aware of arrival. Please see flowsheets for initial assessment. Will continue to closely monitor and await orders.

## 2018-01-08 NOTE — ED Notes (Signed)
md has titrated NTG drip as follows: 0454 7050mcg/min, 0457 12000mcg/min, 0459 13750mcg/min, 0501 26300mcg/min, 0503 35700mcg/min

## 2018-01-08 NOTE — ED Notes (Signed)
Pharmacy called for Levophed to be sent to ED at this time.

## 2018-01-08 NOTE — ED Provider Notes (Signed)
[facility] Emergency Department Provider Note  ____________________________________________   First MD Initiated Contact with Patient 01/08/18 0448     (approximate)  I have reviewed the triage vital signs and the nursing notes.   HISTORY  Chief Complaint Respiratory Distress  Level 5 exemption history limited by the patient's critical illness  HPI Christopher Bryan. is a 57 y.o. male who comes to the emergency department via EMS with severe shortness of breath.  He has a past medical history of CHF as well as COPD and he reports compliance with all of his medications.  According to the patient he became acutely short of breath about half hour prior to arrival.  EMS administered 1 DuoNeb, one albuterol treatment, and 125 mg of Solu-Medrol prehospital.  He was initially saturating 72% however came up to the mid 90s on 15 L of nonrebreather.    Past Medical History:  Diagnosis Date  . Hypertension     Patient Active Problem List   Diagnosis Date Noted  . Acute hypoxemic respiratory failure ([place]) 01/08/2018  . Acute on chronic diastolic CHF (congestive heart failure) ([place]) 06/21/2016  . Acute right-sided heart failure ([place]) 06/21/2016    Past Surgical History:  Procedure Laterality Date  . KNEE ARTHROSCOPY Left   . ORIF FEMUR FRACTURE Right     Prior to Admission medications   Medication Sig Start Date End Date Taking? Authorizing Provider  acetaminophen (TYLENOL) 325 MG tablet Take 650 mg by mouth 3 (three) times daily as needed for mild pain or fever.    [provider]  aspirin EC 81 MG EC tablet Take 1 tablet (81 mg total) by mouth daily. 06/24/16   Gladstone Lighter, MD  atorvastatin (LIPITOR) 20 MG tablet Take 20 mg by mouth at bedtime.    [provider]  budesonide-formoterol (SYMBICORT) 160-4.5 MCG/ACT inhaler Inhale 2 puffs into the lungs 2 (two) times daily. 06/23/16   Gladstone Lighter, MD  carvedilol (COREG) 12.5 MG  tablet Take 12.5 mg by mouth 2 (two) times daily with a meal.    [provider]  cholecalciferol (VITAMIN D) 1000 units tablet Take 2,000 Units by mouth daily.    [provider]  doxycycline (VIBRA-TABS) 100 MG tablet Take 1 tablet (100 mg total) by mouth every 12 (twelve) hours. X 5 more days 06/23/16   Gladstone Lighter, MD  gabapentin (NEURONTIN) 800 MG tablet Take 400 mg by mouth 2 (two) times daily.    [provider]  ipratropium-albuterol (DUONEB) 0.5-2.5 (3) MG/3ML SOLN Take 3 mLs by nebulization every 6 (six) hours as needed. 06/23/16   Gladstone Lighter, MD  lisinopril (PRINIVIL,ZESTRIL) 10 MG tablet Take 10 mg by mouth daily.    [provider]  loratadine (CLARITIN) 10 MG tablet Take 10 mg by mouth daily as needed for allergies.    [provider]  nicotine (NICODERM CQ - DOSED IN MG/24 HOURS) 21 mg/24hr patch Place 1 patch (21 mg total) onto the skin daily. 06/24/16   Gladstone Lighter, MD  predniSONE (STERAPRED UNI-PAK 21 TAB) 10 MG (21) TBPK tablet 6 tabs PO x 1 day 5 tabs PO x 1 day 4 tabs PO x 1 day 3 tabs PO x 1 day 2 tabs PO x 1 day 1 tab PO x 1 day and stop 06/23/16   Gladstone Lighter, MD  traMADol (ULTRAM) 50 MG tablet Take 50 mg by mouth 2 (two) times daily as needed for moderate pain.  [provider]  traZODone (DESYREL) 50 MG tablet Take 25 mg by mouth at bedtime.    [provider]  warfarin (COUMADIN) 5 MG tablet Take 2.5-5 mg by mouth daily. Take 2.5 mg on Wednesday and Friday. Take 5 mg all other days.    [provider]    Allergies Ivp dye [iodinated diagnostic agents]  Family History  Problem Relation Age of Onset  . CAD Mother   . Emphysema Father     Social History Social History   Tobacco Use  . Smoking status: Current Some Day Smoker  . Smokeless tobacco: Never Used  Substance Use Topics  . Alcohol use: Yes  . Drug use: No    Review of Systems Level 5 exemption  history limited by the patient's clinical condition  ____________________________________________   PHYSICAL EXAM:  VITAL SIGNS: ED Triage Vitals  Enc Vitals Group     BP      Pulse      Resp      Temp      Temp src      SpO2      Weight      Height      Head Circumference      Peak Flow      Pain Score      Pain Loc      Pain Edu?      Excl. in [place]?     Constitutional: Appears critically ill in severe respiratory distress sitting bolt upright using all of his accessory muscles and diaphoretic Eyes: PERRL EOMI. dilated and brisk Head: Atraumatic. Nose: No congestion/rhinnorhea. Mouth/Throat: No trismus Neck: No stridor.  Able to lie completely flat although very large JVD Cardiovascular: Tachycardic rate, regular rhythm. Grossly normal heart sounds.  Good peripheral circulation. Respiratory: Severe respiratory distress using accessory muscles.  Very limited air entry in all fields.  I do appreciate some slight wheezing although difficult to hear secondary to how tight his lungs are Gastrointestinal: Soft nontender Musculoskeletal:'s are equal in size.  Trace edema bilaterally Neurologic:  No gross focal neurologic deficits are appreciated. Skin: Diaphoretic Psychiatric: Very anxious appearing    ____________________________________________   DIFFERENTIAL includes but not limited to  Flash pulmonary edema, congestive heart failure exacerbation, pneumonia, pneumothorax, pulmonary embolism, COPD exacerbation, influenza ____________________________________________   LABS (all labs ordered are listed, but only abnormal results are displayed)  Labs Reviewed  COMPREHENSIVE METABOLIC PANEL - Abnormal; Notable for the following components:      Result Value   Glucose, Bld 117 (*)    All other components within normal limits  BRAIN NATRIURETIC PEPTIDE - Abnormal; Notable for the following components:   B Natriuretic Peptide 203.0 (*)    All other components within  normal limits  BLOOD GAS, VENOUS - Abnormal; Notable for the following components:   pH, Ven 7.24 (*)    pCO2, Ven 70 (*)    pO2, Ven <31.0 (*)    Bicarbonate 30.0 (*)    All other components within normal limits  BLOOD GAS, ARTERIAL - Abnormal; Notable for the following components:   pH, Arterial 7.19 (*)    pCO2 arterial 57 (*)    pO2, Arterial 109 (*)    Acid-base deficit 7.0 (*)    All other components within normal limits  POCT I-STAT, CHEM 8 - Abnormal; Notable for the following components:   Glucose, Bld 109 (*)    All other components within normal limits  TROPONIN I  CBC WITH DIFFERENTIAL/PLATELET  INFLUENZA PANEL BY PCR (TYPE A & B)  LACTIC ACID, PLASMA  LACTIC ACID, PLASMA  MAGNESIUM  PHOSPHORUS  PROTIME-INR  TROPONIN I  TROPONIN I  CG4 I-STAT (LACTIC ACID)    Lab work reviewed by me with a number of abnormalities.  Elevated BNP is consistent with stretch.  Preintubation blood gas shows hypercarbic respiratory acidosis.  Post intubation ABG shows worsening metabolic acidosis __________________________________________  EKG  ED ECG REPORT I, Darel Hong, the attending physician, personally viewed and interpreted this ECG.  Date: 01/08/2018 EKG Time:  Rate: 111 Rhythm: Sinus tachycardia QRS Axis: Leftward axis Intervals: normal ST/T Wave abnormalities: Diffuse flat ST depression with slight elevation in aVR concerning for global ischemia with no evidence of STEMI Narrative Interpretation: Concerning for global ischemia  ____________________________________________  RADIOLOGY  First chest x-ray reviewed by me concerning for atelectasis versus infiltrate Second chest x-ray reviewed by me shows endotracheal and orogastric tubes in good position CT scan of the head reviewed by me with no acute disease but does show an 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 ____________________________________________   PROCEDURES  Procedure(s) performed:  Yes  .Critical Care Performed by: Darel Hong, MD Authorized by: Darel Hong, MD   Critical care provider statement:    Critical care time (minutes):  65   Critical care time was exclusive of:  Separately billable procedures and treating other patients   Critical care was necessary to treat or prevent imminent or life-threatening deterioration of the following conditions:  Respiratory failure   Critical care was time spent personally by me on the following activities:  Development of treatment plan with patient or surrogate, discussions with consultants, evaluation of patient's response to treatment, examination of patient, obtaining history from patient or surrogate, ordering and performing treatments and interventions, ordering and review of laboratory studies, ordering and review of radiographic studies, pulse oximetry, re-evaluation of patient's condition and review of old charts Procedure Name: Intubation Date/Time: 01/08/2018 8:07 AM Performed by: Darel Hong, MD Pre-anesthesia Checklist: Patient identified, Patient being monitored, Emergency Drugs available, Timeout performed and Suction available Oxygen Delivery Method: Non-rebreather mask Preoxygenation: Pre-oxygenation with 100% oxygen Induction Type: Rapid sequence Ventilation: Mask ventilation without difficulty Laryngoscope Size: Mac and 4 Grade View: Grade I Tube size: 8.0 mm Number of attempts: 1 Placement Confirmation: ETT inserted through vocal cords under direct vision,  CO2 detector and Breath sounds checked- equal and bilateral Secured at: 24 cm Tube secured with: ETT holder    OG placement Date/Time: 01/08/2018 8:08 AM Performed by: Darel Hong, MD Authorized by: Darel Hong, MD  Consent: The procedure was performed in an emergent situation. Patient tolerance: Patient tolerated the procedure well with no immediate complications     Critical Care performed:  Yes  ____________________________________________   INITIAL IMPRESSION / ASSESSMENT AND PLAN / ED COURSE  Pertinent labs & imaging results that were available during my care of the patient were reviewed by me and considered in my medical decision making (see chart for details).   As part of my medical decision making, I reviewed the following data within the [place] History obtained from family if available, nursing notes, old chart and ekg, as well as notes from prior ED visits.  The patient came to the emergency department critically ill diaphoretic in severe respiratory distress using accessory muscles with large JVD and very tight sounding lungs.  He was hypertensive to 220 or so and gasping for air despite BiPAP.  He does have a past medical  history of both CHF and COPD and initially it was unclear whether or not this was more pulmonary or cardiac in etiology.  Placed on BiPAP with some improvement of his symptoms however he remained extremely hypertensive and I was concerned for flash pulmonary edema so I began him on a nitroglycerin infusion.  Over the course of about 15 minutes I titrated his nitroglycerin infusion from 20 mics a minute all the way up to 300 mcg a minute with essentially no change in his symptoms and his blood pressure only came down to around 295 systolic.  He continued to gasp for air and felt like he was getting tired so decision was made to intubate.  Prior to intubation I stopped the nitroglycerin anticipating post intubation hypotension.  He was subsequently intubated with fentanyl ketamine and succinylcholine without complication.  Following the intubation he did become quite hypotensive and at this point as I had his airway secured began bolusing him IV fluids.  We did not have levo fed available so while we were waiting and he was hypotensive to the 60s over 40s I put 1 mg of epinephrine and a liter of normal saline and ran it wide open which did seem  to improve his blood pressure prior to the Levophed.  At this point he is currently stabilized on fentanyl, propofol, and Levophed infusions along with IV fluids.  In addition to the 3 duo nebs I have run 20 mg of albuterol through his ventilator and given him Solu-Medrol as well as Decadron and 2 g of magnesium IV.  I did consider pulmonary embolism however the patient is currently anticoagulated for his atrial fibrillation on Eliquis and I have a low suspicion.  I then discussed with the hospitalist Dr. Oren Bracket who has graciously agreed to admit the patient to his service.  We did attempt to get in touch with family however no one is picking up and their mailbox is full.  We will continue to reach out to family.  He is currently critically ill although stable.      ____________________________________________   FINAL CLINICAL IMPRESSION(S) / ED DIAGNOSES  Final diagnoses:  COPD exacerbation ([place])  Acute respiratory failure with hypoxia ([place])  Hypovolemic shock ([facility])      NEW MEDICATIONS STARTED DURING THIS VISIT:  New Prescriptions   No medications on file     Note:  This document was prepared using Dragon voice recognition software and may include unintentional dictation errors.     Darel Hong, MD 01/08/18 [phone]

## 2018-01-08 NOTE — ED Notes (Signed)
80mcg propofol bolus was given by dr. Lamont Snowballrifenbark

## 2018-01-08 NOTE — ED Triage Notes (Signed)
Pt arrives in resp distress. Ems gave one duoneb and 125mg  of solumedrol. Pt on bipap.

## 2018-01-08 NOTE — ED Notes (Signed)
Pt to ct with this rn and RT.

## 2018-01-08 NOTE — ED Notes (Signed)
Report to amber and gracie, rn.

## 2018-01-08 NOTE — Progress Notes (Signed)
Patient transferred to ct without incident.  abg results given to me once I returned.  Verified abg results with Dr Roxan Hockeyobinson 01/08/2018 0658 vent settings vt 450 rr 20 40% peep 5

## 2018-01-09 ENCOUNTER — Encounter: Payer: Self-pay | Admitting: *Deleted

## 2018-01-09 LAB — CBC WITH DIFFERENTIAL/PLATELET
Abs Immature Granulocytes: 0.05 10*3/uL (ref 0.00–0.07)
Basophils Absolute: 0 10*3/uL (ref 0.0–0.1)
Basophils Relative: 0 %
Eosinophils Absolute: 0 10*3/uL (ref 0.0–0.5)
Eosinophils Relative: 0 %
HEMATOCRIT: 37.8 % — AB (ref 39.0–52.0)
Hemoglobin: 11.7 g/dL — ABNORMAL LOW (ref 13.0–17.0)
Immature Granulocytes: 1 %
LYMPHS ABS: 0.7 10*3/uL (ref 0.7–4.0)
LYMPHS PCT: 7 %
MCH: 29.4 pg (ref 26.0–34.0)
MCHC: 31 g/dL (ref 30.0–36.0)
MCV: 95 fL (ref 80.0–100.0)
Monocytes Absolute: 0.5 10*3/uL (ref 0.1–1.0)
Monocytes Relative: 5 %
Neutro Abs: 8.3 10*3/uL — ABNORMAL HIGH (ref 1.7–7.7)
Neutrophils Relative %: 87 %
Platelets: 169 10*3/uL (ref 150–400)
RBC: 3.98 MIL/uL — ABNORMAL LOW (ref 4.22–5.81)
RDW: 14 % (ref 11.5–15.5)
WBC: 9.5 10*3/uL (ref 4.0–10.5)
nRBC: 0 % (ref 0.0–0.2)

## 2018-01-09 LAB — BASIC METABOLIC PANEL
Anion gap: 6 (ref 5–15)
BUN: 24 mg/dL — ABNORMAL HIGH (ref 6–20)
CO2: 23 mmol/L (ref 22–32)
Calcium: 8.2 mg/dL — ABNORMAL LOW (ref 8.9–10.3)
Chloride: 111 mmol/L (ref 98–111)
Creatinine, Ser: 1.39 mg/dL — ABNORMAL HIGH (ref 0.61–1.24)
GFR calc Af Amer: >60 mL/min (ref >60)
GFR calc non Af Amer: 55 mL/min — ABNORMAL LOW (ref >60)
Glucose, Bld: 150 mg/dL — ABNORMAL HIGH (ref 70–99)
Potassium: 5 mmol/L (ref 3.5–5.1)
Sodium: 140 mmol/L (ref 135–145)

## 2018-01-09 LAB — BLOOD GAS, ARTERIAL
Acid-base deficit: 3.5 mmol/L — ABNORMAL HIGH (ref 0.0–2.0)
Acid-base deficit: 1.1 mmol/L (ref 0.0–2.0)
Bicarbonate: 25.1 mmol/L (ref 20.0–28.0)
Bicarbonate: 25.3 mmol/L (ref 20.0–28.0)
FIO2: 0.4
FIO2: 0.3
MECHVT: 450 mL
O2 Saturation: 88.4 %
O2 Saturation: 92.8 %
PEEP: 5 cmH2O
PEEP: 5 cmH2O
Patient temperature: 37
Patient temperature: 37
Pressure support: 5 cmH2O
RATE: 20 resp/min
pCO2 arterial: 60 mmHg — ABNORMAL HIGH (ref 32.0–48.0)
pCO2 arterial: 48 mmHg (ref 32.0–48.0)
pH, Arterial: 7.23 — ABNORMAL LOW (ref 7.350–7.450)
pH, Arterial: 7.33 — ABNORMAL LOW (ref 7.350–7.450)
pO2, Arterial: 66 mmHg — ABNORMAL LOW (ref 83.0–108.0)
pO2, Arterial: 71 mmHg — ABNORMAL LOW (ref 83.0–108.0)

## 2018-01-09 LAB — GLUCOSE, CAPILLARY: Glucose-Capillary: 154 mg/dL — ABNORMAL HIGH (ref 70–99)

## 2018-01-09 MED ORDER — FUROSEMIDE 10 MG/ML IJ SOLN
20.0000 mg | Freq: Once | INTRAMUSCULAR | Status: AC
  Administered 2018-01-09: 20 mg via INTRAVENOUS
  Filled 2018-01-09: qty 2

## 2018-01-09 MED ORDER — ONDANSETRON HCL 4 MG/2ML IJ SOLN
4.0000 mg | Freq: Four times a day (QID) | INTRAMUSCULAR | Status: DC | PRN
  Administered 2018-01-09: 4 mg via INTRAVENOUS

## 2018-01-09 MED ORDER — ACETAMINOPHEN 325 MG PO TABS: 650.0000 mg | ORAL_TABLET | Freq: Four times a day (QID) | ORAL | Status: DC | PRN

## 2018-01-09 MED ORDER — TRAZODONE HCL 50 MG PO TABS
25.0000 mg | ORAL_TABLET | Freq: Every evening | ORAL | Status: DC | PRN
  Administered 2018-01-10 – 2018-01-11 (×3): 25 mg via ORAL
  Filled 2018-01-09 (×3): qty 1

## 2018-01-09 MED ORDER — GABAPENTIN 400 MG PO CAPS
400.0000 mg | ORAL_CAPSULE | Freq: Three times a day (TID) | ORAL | Status: DC
  Administered 2018-01-10 – 2018-01-12 (×8): 400 mg via ORAL
  Filled 2018-01-09 (×8): qty 1

## 2018-01-09 MED ORDER — CARVEDILOL 12.5 MG PO TABS
12.5000 mg | ORAL_TABLET | Freq: Two times a day (BID) | ORAL | Status: DC
  Administered 2018-01-09 – 2018-01-11 (×4): 12.5 mg via ORAL
  Filled 2018-01-09 (×4): qty 1

## 2018-01-09 MED ORDER — LABETALOL HCL 5 MG/ML IV SOLN
10.0000 mg | INTRAVENOUS | Status: DC | PRN
  Administered 2018-01-10 – 2018-01-12 (×2): 10 mg via INTRAVENOUS
  Filled 2018-01-09 (×2): qty 4

## 2018-01-09 MED ORDER — TRAZODONE HCL 50 MG PO TABS: 50.0000 mg | ORAL_TABLET | Freq: Every evening | ORAL | Status: DC | PRN

## 2018-01-09 MED ORDER — LABETALOL HCL 5 MG/ML IV SOLN
10.0000 mg | Freq: Once | INTRAVENOUS | Status: AC
  Administered 2018-01-09: 10 mg via INTRAVENOUS
  Filled 2018-01-09: qty 4

## 2018-01-09 MED ORDER — LISINOPRIL 5 MG PO TABS
10.0000 mg | ORAL_TABLET | Freq: Every day | ORAL | Status: DC
  Administered 2018-01-09: 10 mg via ORAL
  Filled 2018-01-09: qty 2

## 2018-01-09 MED ORDER — BUDESONIDE 0.25 MG/2ML IN SUSP
0.2500 mg | Freq: Two times a day (BID) | RESPIRATORY_TRACT | Status: DC
  Administered 2018-01-09 – 2018-01-12 (×6): 0.25 mg via RESPIRATORY_TRACT
  Filled 2018-01-09 (×6): qty 2

## 2018-01-09 MED ORDER — LISINOPRIL 20 MG PO TABS
20.0000 mg | ORAL_TABLET | Freq: Every day | ORAL | Status: DC
  Administered 2018-01-10 – 2018-01-12 (×3): 20 mg via ORAL
  Filled 2018-01-09 (×3): qty 1

## 2018-01-09 MED ORDER — HYDRALAZINE HCL 20 MG/ML IJ SOLN
10.0000 mg | INTRAMUSCULAR | Status: DC | PRN
  Administered 2018-01-10 – 2018-01-11 (×3): 10 mg via INTRAVENOUS
  Filled 2018-01-09 (×3): qty 1

## 2018-01-09 MED ORDER — NICARDIPINE HCL IN NACL 20-0.86 MG/200ML-% IV SOLN
3.0000 mg/h | INTRAVENOUS | Status: DC
  Administered 2018-01-09 – 2018-01-10 (×3): 5 mg/h via INTRAVENOUS
  Filled 2018-01-09 (×4): qty 200

## 2018-01-09 MED ORDER — HYDRALAZINE HCL 20 MG/ML IJ SOLN
10.0000 mg | INTRAMUSCULAR | Status: DC | PRN
  Administered 2018-01-09 (×2): 10 mg via INTRAVENOUS
  Filled 2018-01-09 (×2): qty 1

## 2018-01-09 MED ORDER — NICOTINE 21 MG/24HR TD PT24
21.0000 mg | MEDICATED_PATCH | Freq: Every day | TRANSDERMAL | Status: DC
  Administered 2018-01-09 – 2018-01-12 (×4): 21 mg via TRANSDERMAL
  Filled 2018-01-09 (×4): qty 1

## 2018-01-09 MED ORDER — ONDANSETRON HCL 4 MG/2ML IJ SOLN
INTRAMUSCULAR | Status: AC
  Filled 2018-01-09: qty 2

## 2018-01-09 MED ORDER — ORAL CARE MOUTH RINSE
15.0000 mL | Freq: Two times a day (BID) | OROMUCOSAL | Status: DC
  Administered 2018-01-10 – 2018-01-12 (×4): 15 mL via OROMUCOSAL

## 2018-01-09 MED ORDER — LISINOPRIL 5 MG PO TABS
10.0000 mg | ORAL_TABLET | Freq: Once | ORAL | Status: AC
  Administered 2018-01-09: 10 mg via ORAL
  Filled 2018-01-09: qty 2

## 2018-01-09 NOTE — Progress Notes (Signed)
Follow up - Critical Care Medicine Note  Patient Details:    Christopher Bryan. is an 57 y.o. male. 57 year old current smoker, admitted with acute hypoxic and hypercarbic respiratory failure requiring ventilator support.  Suspect COPD exacerbation.  Remote history of PE on Eliquis.  Lines, Airways, Drains: Urethral Catheter april, rn Straight-tip 16 Fr. (Active)  Indication for Insertion or Continuance of Catheter Unstable critical patients (first 24-48 hours) 01/09/2018 12:00 PM  Site Assessment Clean;Intact;Dry 01/09/2018 12:00 PM  Catheter Maintenance Bag below level of bladder;Catheter secured;Drainage bag/tubing not touching floor;No dependent loops;Seal intact;Bag emptied prior to transport 01/09/2018 12:00 PM  Collection Container Standard drainage bag 01/09/2018 12:00 PM  Securement Method Securing device (Describe) 01/09/2018 12:00 PM  Urinary Catheter Interventions Unclamped 01/09/2018 12:00 PM  Output (mL) 250 mL 01/09/2018  7:57 PM    Anti-infectives:  Anti-infectives (From admission, onward)   Start     Dose/Rate Route Frequency Ordered Stop   01/08/18 0615  azithromycin (ZITHROMAX) 500 mg in sodium chloride 0.9 % 250 mL IVPB     500 mg 250 mL/hr over 60 Minutes Intravenous Every 24 hours 01/08/18 0602        Microbiology: Results for orders placed or performed during the hospital encounter of 01/08/18  MRSA PCR Screening     Status: None   Collection Time: 01/08/18 10:33 AM  Result Value Ref Range Status   MRSA by PCR NEGATIVE NEGATIVE Final    Comment:        The GeneXpert MRSA Assay (FDA approved for NASAL specimens only), is one component of a comprehensive MRSA colonization surveillance program. It is not intended to diagnose MRSA infection nor to guide or monitor treatment for MRSA infections. Performed at [facility], [address] [place], [place] [address]     Best Practice/Protocols:  VTE Prophylaxis: Direct Thrombin Inhibitor GI  Prophylaxis: Proton Pump Inhibitor N/A  Events:   Studies: Dg Chest 1 View  Result Date: 01/08/2018 CLINICAL DATA:  57 year old male with intubation. EXAM: CHEST  1 VIEW COMPARISON:  Earlier radiograph dated 01/08/2018 FINDINGS: Interval placement of an endotracheal tube with tip approximately 4 cm above the carina. An enteric tube extends in the left upper abdomen with tip likely in the proximal stomach. The lungs are clear. There is no pleural effusion or pneumothorax. The cardiac silhouette is within normal limits. Mild atherosclerotic calcification of the aorta. No acute osseous pathology. IMPRESSION: Endotracheal tube above the carina and enteric tube likely in the stomach. Electronically Signed   By: Elgie Collard M.D.   On: 01/08/2018 06:02   Dg Abd 1 View  Result Date: 01/08/2018 CLINICAL DATA:  58 year old male with NG tube placement. EXAM: ABDOMEN - 1 VIEW COMPARISON:  Abdominal radiograph dated 01/08/2018 FINDINGS: There has been interval advancement of the enteric tube with tip and side-port in the left upper abdomen likely in the stomach. IMPRESSION: Enteric tube with tip and side-port likely in the stomach. Electronically Signed   By: Elgie Collard M.D.   On: 01/08/2018 22:48   Dg Abd 1 View  Result Date: 01/08/2018 CLINICAL DATA:  57 y/o  M; OG tube placement. EXAM: ABDOMEN - 1 VIEW COMPARISON:  None. FINDINGS: Enteric tube tip projects over the gastric body, proximal side hole is in the distal esophagus. Clear lung bases. Normal bowel gas pattern. No acute osseous abnormality is evident. IMPRESSION: Enteric tube tip projects over the gastric body, proximal side hole in the distal esophagus. Advancement is recommended. Electronically Signed  By: Mitzi Hansen M.D.   On: 01/08/2018 22:08   Ct Head Wo Contrast  Result Date: 01/08/2018 CLINICAL DATA:  Altered mental status EXAM: CT HEAD WITHOUT CONTRAST TECHNIQUE: Contiguous axial images were obtained from the base  of the skull through the vertex without intravenous contrast. COMPARISON:  July 30, 2008 FINDINGS: Brain: The ventricles are normal in size configuration. There is no intracranial mass, hemorrhage, extra-axial fluid collection, or midline shift. There is slight small vessel disease in the centra semiovale bilaterally. Brain parenchyma elsewhere appears unremarkable. No acute infarct evident. Vascular: There is no appreciable hyperdense vessel. There is a questionable aneurysm arising from the proximal A1 segment of the right middle cerebral artery measuring 4 mm, best seen on axial slice 14 series 2. There is calcification in each carotid siphon region. Skull: The bony calvarium appears intact. Sinuses/Orbits: There is opacification throughout multiple ethmoid air cells and in each maxillary antrum. There is edema the nasal turbinates. Note that patient is intubated. No intraorbital lesions are evident. Other: Mastoid air cells are clear. IMPRESSION: 1. Questionable 4 mm aneurysm arising from the proximal M1 segment of the right middle cerebral artery. This finding may warrant nonemergent CT angiography for further assessment when patient is clinically able. There are foci of arterial vascular calcification noted. 2. Mild periventricular small vessel disease noted. No acute infarct evident. No mass or hemorrhage. 3.  Multifocal paranasal sinus disease. Electronically Signed   By: Bretta Bang III M.D.   On: 01/08/2018 07:04   Dg Chest Port 1 View  Result Date: 01/08/2018 CLINICAL DATA:  57 year old male with shortness of breath. EXAM: PORTABLE CHEST 1 VIEW COMPARISON:  Chest radiograph dated 06/22/2016 FINDINGS: Minimal left lung base atelectatic changes. Developing infiltrate is not excluded. Clinical correlation is recommended. The right lung is clear. There is no pleural effusion or pneumothorax. The costophrenic angles have been excluded from the image. The cardiac silhouette is within normal limits.  Atherosclerotic calcification of the aortic arch. No acute osseous pathology. IMPRESSION: Minimal left lung base minimal atelectatic versus less likely developing infiltrate. Electronically Signed   By: Elgie Collard M.D.   On: 01/08/2018 05:32    Consults: Treatment Team:  Barbaraann Rondo, MD   Subjective:    Overnight Issues:  No overnight issues.  Has been able to wean off of pressors.  No further bradycardia off of propofol. Objective:  Vital signs for last 24 hours: Temp:  [97.8 F (36.6 C)-100.1 F (37.8 C)] 99 F (37.2 C) (12/10 1948) Pulse Rate:  [49-128] 120 (12/10 2000) Resp:  [12-24] 16 (12/10 2000) BP: (90-204)/(49-107) 158/80 (12/10 2000) SpO2:  [91 %-100 %] 94 % (12/10 2000) FiO2 (%):  [30 %] 30 % (12/10 1200)  Hemodynamic parameters for last 24 hours:    Intake/Output from previous day: 12/09 0701 - 12/10 0700 In: 1901.4 [I.V.:1401.2; IV Piggyback:500.2] Out: 1100 [Urine:1100]  Intake/Output this shift: Total I/O In: -  Out: 250 [Urine:250]  Vent settings for last 24 hours: Vent Mode: PSV FiO2 (%):  [30 %] 30 % Set Rate:  [20 bmp-24 bmp] 24 bmp Vt Set:  [450 mL] 450 mL PEEP:  [5 cmH20] 5 cmH20 Pressure Support:  [5 cmH20] 5 cmH20  Physical Exam:  Constitutional: He appears well-developed and well-nourished. He is  awake and following simple commands off of sedatives. HENT:  Head: Normocephalic and atraumatic.  Mouth/Throat: Orotracheally intubated, mucosal membranes normal.  Eyes: Pupils are equal, round, and reactive to light. Conjunctivae are normal. No scleral  icterus.  Neck: Neck supple. No JVD present. No tracheal deviation present. No thyromegaly present.  Cardiovascular: Regular rhythm, S1 normal, S2 normal and intact distal pulses.   NSR present. Exam reveals no gallop. No murmur heard.  Respiratory: He is intubated.  Synchronous with the ventilator, diffuse distant breath sounds. Few scattered rhonchi  GI: Soft. Bowel sounds are  normal. He exhibits no distension.  Musculoskeletal: He exhibits no edema or deformity.  Lymphadenopathy:    He has no cervical adenopathy.  Neurological:   No focal deficit noted.  Follows commands.  Skin: Skin is warm, dry and intact.  Psychiatric:   Intubated unable to assess.   Assessment/Plan:  1.  Acute ventilator dependent respiratory failure, both hypoxic and hypercarbic.  Appears to be due to acute exacerbation of COPD.  No overt evidence of pneumonia however cannot exclude potential left lower lobe patchy infiltrate.    He has done well with wake-up assessment and SBT trials this morning.  Discussed extubation with respiratory therapy, will proceed with the same.  2.  COPD by clinical impression, appears to be advanced.  Agree with bronchodilators, IV steroids, pulmonary toilet.  3.  Cannot exclude pneumonia versus bronchitis.  Favor bronchitis.  Agree with Azithromycin.  4.  DVT in GI prophylaxis continue.  5.  Bradycardia due to propofol, no further issues with this since propofol discontinued.   6.  Hypotension likely due to volume depletion aggravated by sedatives.    Responded to volume resuscitation, decreasing sedatives.  Off of pressors.   LOS: 1 day   Additional comments: Multidisciplinary ICU rounds were performed.  Critical Care Total Time*: 40 minutes  Sarina SerCarmen Charnell Peplinski 01/09/2018  *Care during the described time interval was provided by me and/or other providers on the critical care team.  I have reviewed this patient's available data, including medical history, events of note, physical examination and test results as part of my evaluation.

## 2018-01-09 NOTE — Progress Notes (Signed)
[employer] [facility] - [place] at [facility]   PATIENT NAME: Christopher Angert    MR#:  [id number]  DATE OF BIRTH:  [birth year]  SUBJECTIVE:  CHIEF COMPLAINT:   Chief Complaint  Patient presents with  . Respiratory Distress   Extubated. Sister at bedside REVIEW OF SYSTEMS:  Review of Systems  Constitutional: Negative for chills, fever and weight loss.  HENT: Negative for nosebleeds and sore throat.   Eyes: Negative for blurred vision.  Respiratory: Positive for shortness of breath. Negative for cough and wheezing.   Cardiovascular: Negative for chest pain, orthopnea, leg swelling and PND.  Gastrointestinal: Negative for abdominal pain, constipation, diarrhea, heartburn, nausea and vomiting.  Genitourinary: Negative for dysuria and urgency.  Musculoskeletal: Negative for back pain.  Skin: Negative for rash.  Neurological: Negative for dizziness, speech change, focal weakness and headaches.  Endo/Heme/Allergies: Does not bruise/bleed easily.  Psychiatric/Behavioral: Negative for depression.    DRUG ALLERGIES:   Allergies  Allergen Reactions  . Ivp Dye [Iodinated Diagnostic Agents] Hives   VITALS:  Blood pressure (!) 192/87, pulse (!) 115, temperature 98.3 F (36.8 C), temperature source Oral, resp. rate (!) 23, height 5\' 10"  (1.778 m), weight 72.6 kg, SpO2 97 %. PHYSICAL EXAMINATION:  Physical Exam  Constitutional: He is oriented to person, place, and time.  HENT:  Head: Normocephalic and atraumatic.  Eyes: Pupils are equal, round, and reactive to light. Conjunctivae and EOM are normal.  Neck: Normal range of motion. Neck supple. No tracheal deviation present. No thyromegaly present.  Cardiovascular: Normal rate, regular rhythm and normal heart sounds.  Pulmonary/Chest: Effort normal. No respiratory distress. He has wheezes. He exhibits no tenderness.  Abdominal: Soft. Bowel sounds are normal. He exhibits no distension. There is no tenderness.  Musculoskeletal:  Normal range of motion.  Neurological: He is alert and oriented to person, place, and time. No cranial nerve deficit.  Skin: Skin is warm and dry. No rash noted.   LABORATORY PANEL:  Male CBC Recent Labs  Lab 01/09/18 0530  WBC 9.5  HGB 11.7*  HCT 37.8*  PLT 169   ------------------------------------------------------------------------------------------------------------------ Chemistries  Recent Labs  Lab 01/08/18 0445  01/09/18 0530  NA 142   < > 140  K 4.1   < > 5.0  CL 107   < > 111  CO2 29  --  23  GLUCOSE 117*   < > 150*  BUN 14   < > 24*  CREATININE 1.10   < > 1.39*  CALCIUM 9.0  --  8.2*  MG 1.7  --   --   AST 24  --   --   ALT 14  --   --   ALKPHOS 83  --   --   BILITOT 0.5  --   --    < > = values in this interval not displayed.   RADIOLOGY:  Dg Abd 1 View  Result Date: 01/08/2018 CLINICAL DATA:  57 year old male with NG tube placement. EXAM: ABDOMEN - 1 VIEW COMPARISON:  Abdominal radiograph dated 01/08/2018 FINDINGS: There has been interval advancement of the enteric tube with tip and side-port in the left upper abdomen likely in the stomach. IMPRESSION: Enteric tube with tip and side-port likely in the stomach. Electronically Signed   By: Elgie Collard M.D.   On: 01/08/2018 22:48   Dg Abd 1 View  Result Date: 01/08/2018 CLINICAL DATA:  57 y/o  M; OG tube placement OG tube placement. EXAM: ABDOMEN - 1 VIEW COMPARISON:  None. FINDINGS:  Enteric tube tip projects over the gastric body, proximal side hole is in the distal esophagus. Clear lung bases. Normal bowel gas pattern. No acute osseous abnormality is evident. IMPRESSION: Enteric tube tip projects over the gastric body, proximal side hole in the distal esophagus. Advancement is recommended. Electronically Signed   By: Mitzi HansenLance  Furusawa-Stratton M.D.   On: 01/08/2018 22:08   ASSESSMENT AND PLAN:   1.  Acute ventilator dependent respiratory failure, both hypoxic and hypercarbic - due to exacerbation of COPD.   Extubated and  now on N.C.  2.  COPD exacerbation: mgmt per PCCM, continue nebs, steroids, Abx  3. Sinus tach: monitor  4 Hypotension - resolved. Now bp high     All the records are reviewed and case discussed with Care Management/Social Worker. Management plans discussed with the patient, family (sister at bedside) and they are in agreement.  CODE STATUS: Full Code  TOTAL TIME TAKING CARE OF THIS PATIENT: 15 minutes.   More than 50% of the time was spent in counseling/coordination of care: YES  POSSIBLE D/C IN 1-2 DAYS, DEPENDING ON CLINICAL CONDITION.   Delfino LovettVipul Onyekachi Gathright M.D on 01/09/2018 at 3:44 PM  Between 7am to 6pm - Pager - [phone]  After 6pm go to [email or web address] - Social research officer, governmentpassword EPAS [email or web address]  [facility] [place] [facility]  Office  [phone]  CC: Primary care physician; Patient, No Pcp Per  Note: This dictation was prepared with Dragon dictation along with smaller phrase technology. Any transcriptional errors that result from this process are unintentional.

## 2018-01-09 NOTE — Progress Notes (Signed)
Patient's daughter Waldron LabsShekita Ramakrishnan took patient's medication home.

## 2018-01-09 NOTE — Progress Notes (Signed)
Pt was suctioned prior to extubation for no secretions. Per Dr. Georgann HousekeeperGonzalez's order, he was extubated to a 3 L nasal cannula.

## 2018-01-10 LAB — CBC WITH DIFFERENTIAL/PLATELET
Abs Immature Granulocytes: 0.03 10*3/uL (ref 0.00–0.07)
Basophils Absolute: 0 10*3/uL (ref 0.0–0.1)
Basophils Relative: 0 %
Eosinophils Absolute: 0 10*3/uL (ref 0.0–0.5)
Eosinophils Relative: 0 %
HCT: 37.4 % — ABNORMAL LOW (ref 39.0–52.0)
Hemoglobin: 12 g/dL — ABNORMAL LOW (ref 13.0–17.0)
Immature Granulocytes: 0 %
LYMPHS ABS: 0.5 10*3/uL — AB (ref 0.7–4.0)
Lymphocytes Relative: 6 %
MCH: 29.3 pg (ref 26.0–34.0)
MCHC: 32.1 g/dL (ref 30.0–36.0)
MCV: 91.4 fL (ref 80.0–100.0)
Monocytes Absolute: 0.3 10*3/uL (ref 0.1–1.0)
Monocytes Relative: 4 %
Neutro Abs: 7.9 10*3/uL — ABNORMAL HIGH (ref 1.7–7.7)
Neutrophils Relative %: 90 %
Platelets: 156 10*3/uL (ref 150–400)
RBC: 4.09 MIL/uL — ABNORMAL LOW (ref 4.22–5.81)
RDW: 14.1 % (ref 11.5–15.5)
WBC: 8.7 10*3/uL (ref 4.0–10.5)
nRBC: 0 % (ref 0.0–0.2)

## 2018-01-10 LAB — BASIC METABOLIC PANEL
Anion gap: 6 (ref 5–15)
BUN: 22 mg/dL — ABNORMAL HIGH (ref 6–20)
CALCIUM: 8.6 mg/dL — AB (ref 8.9–10.3)
CO2: 25 mmol/L (ref 22–32)
Chloride: 107 mmol/L (ref 98–111)
Creatinine, Ser: 1.12 mg/dL (ref 0.61–1.24)
GFR calc Af Amer: >60 mL/min (ref >60)
GFR calc non Af Amer: >60 mL/min (ref >60)
Glucose, Bld: 175 mg/dL — ABNORMAL HIGH (ref 70–99)
POTASSIUM: 4.4 mmol/L (ref 3.5–5.1)
Sodium: 138 mmol/L (ref 135–145)

## 2018-01-10 MED ORDER — PHENOL 1.4 % MT LIQD
1.0000 | OROMUCOSAL | Status: DC | PRN
  Filled 2018-01-10 (×2): qty 177

## 2018-01-10 MED ORDER — PANTOPRAZOLE SODIUM 40 MG PO TBEC
40.0000 mg | DELAYED_RELEASE_TABLET | Freq: Every day | ORAL | Status: DC
  Administered 2018-01-10 – 2018-01-12 (×3): 40 mg via ORAL
  Filled 2018-01-10 (×3): qty 1

## 2018-01-10 NOTE — Treatment Plan (Signed)
Patient was extubated 12/10 without sequela.  Hemodynamically stable.  Transfer to floor today which is appropriate.  [facility]  Like to continue follow-up there.  His lungs sound clear.  Would initiate taper of steroids.  He has completed 3-day course of azithromycin.  PCCM will sign off.  Please reconsult as needed.  Gailen Shelter. Laura Gonzalez, MD [place] [facility]

## 2018-01-10 NOTE — Care Management Note (Signed)
Case Management Note  Patient Details  Name: Christopher JosephJames A Labarbera Jr. MRN: [id number] Date of Birth: 10-02-1960  Subjective/Objective:    Patient admitted with acute hypoxic respiratory failure initially requiring intubation.  Patient extubated 12/10 and tolerating [place].  Patient is floor status waiting on a floor bed.  Patient reports that [place] in [place] and he is completely independent.  [facility]  [facility] the [facility] at this [facility] but would like to continue his care there after discharge.  [facility] contacted and refusal to transfer form, H&P, and progress note  faxed to [phone].  Patient reports that he does not drive but his brother in law can provide transportation and [facility] [place] [facility] [place].  Patient reports that he will not need home health when he goes home.  RNCM informed patient that CM is here to assist with discharge needs if any arise.  No needs identified at this time. Christopher LisJeanna Alanah Sakuma RN BSN [phone]                 Action/Plan:   Expected Discharge Date:                  Expected Discharge Plan:  Home/Self Care  In-House Referral:     Discharge planning Services  CM Consult  Post Acute Care Choice:    Choice offered to:     DME Arranged:    DME Agency:     HH Arranged:    HH Agency:     Status of Service:  In process, will continue to follow  If discussed at Long Length of Stay Meetings, dates discussed:    Additional Comments:  Allayne ButcherJeanna M Annina Piotrowski, RN 01/10/2018, 11:39 AM

## 2018-01-10 NOTE — Progress Notes (Signed)
[facility] - [place] at [facility]   PATIENT NAME: Christopher Bryan    MR#:  [id number]  DATE OF BIRTH:  1960/07/31  SUBJECTIVE:  CHIEF COMPLAINT:   Chief Complaint  Patient presents with  . Respiratory Distress  remains on N.C. REVIEW OF SYSTEMS:  Review of Systems  Constitutional: Negative for chills, fever and weight loss.  HENT: Negative for nosebleeds and sore throat.   Eyes: Negative for blurred vision.  Respiratory: Positive for shortness of breath. Negative for cough and wheezing.   Cardiovascular: Negative for chest pain, orthopnea, leg swelling and PND.  Gastrointestinal: Negative for abdominal pain, constipation, diarrhea, heartburn, nausea and vomiting.  Genitourinary: Negative for dysuria and urgency.  Musculoskeletal: Negative for back pain.  Skin: Negative for rash.  Neurological: Negative for dizziness, speech change, focal weakness and headaches.  Endo/Heme/Allergies: Does not bruise/bleed easily.  Psychiatric/Behavioral: Negative for depression.   DRUG ALLERGIES:   Allergies  Allergen Reactions  . Ivp Dye [Iodinated Diagnostic Agents] Hives   VITALS:  Blood pressure (!) 173/69, pulse 77, temperature 98.5 F (36.9 C), temperature source Oral, resp. rate 19, height 5\' 10"  (1.778 m), weight 72.6 kg, SpO2 99 %. PHYSICAL EXAMINATION:  Physical Exam  Constitutional: He is oriented to person, place, and time.  HENT:  Head: Normocephalic and atraumatic.  Eyes: Pupils are equal, round, and reactive to light. Conjunctivae and EOM are normal.  Neck: Normal range of motion. Neck supple. No tracheal deviation present. No thyromegaly present.  Cardiovascular: Normal rate, regular rhythm and normal heart sounds.  Pulmonary/Chest: Effort normal. No respiratory distress. He has wheezes. He exhibits no tenderness.  Abdominal: Soft. Bowel sounds are normal. He exhibits no distension. There is no tenderness.  Musculoskeletal: Normal range of motion.    Neurological: He is alert and oriented to person, place, and time. No cranial nerve deficit.  Skin: Skin is warm and dry. No rash noted.   LABORATORY PANEL:  Male CBC Recent Labs  Lab 01/10/18 0548  WBC 8.7  HGB 12.0*  HCT 37.4*  PLT 156   ------------------------------------------------------------------------------------------------------------------ Chemistries  Recent Labs  Lab 01/08/18 0445  01/10/18 0548  NA 142   < > 138  K 4.1   < > 4.4  CL 107   < > 107  CO2 29   < > 25  GLUCOSE 117*   < > 175*  BUN 14   < > 22*  CREATININE 1.10   < > 1.12  CALCIUM 9.0   < > 8.6*  MG 1.7  --   --   AST 24  --   --   ALT 14  --   --   ALKPHOS 83  --   --   BILITOT 0.5  --   --    < > = values in this interval not displayed.   RADIOLOGY:  No results found. ASSESSMENT AND PLAN:   1.  Acute ventilator dependent respiratory failure, both hypoxic and hypercarbic - due to exacerbation of COPD.   remains on N.C.  2.  COPD exacerbation:  continue nebs, steroids, Abx  3. Sinus tach: monitor  4 Hypotension - resolved. Now BP high - started coreg, lisinopril, prn hydralazine     All the records are reviewed and case discussed with Care Management/Social Worker. Management plans discussed with the patient, nursing and they are in agreement.  CODE STATUS: Full Code  TOTAL TIME TAKING CARE OF THIS PATIENT: 35 minutes.   More than 50%  of the time was spent in counseling/coordination of care: YES  POSSIBLE D/C IN 1-2 DAYS, DEPENDING ON CLINICAL CONDITION.   Delfino Lovett M.D on 01/10/2018 at 3:56 PM  Between 7am to 6pm - Pager - [phone]  After 6pm go to [email or web address] - Social research officer, government  [facility] [place] [facility]  Office  [phone]  CC: Primary care physician; [facility]  Note: This dictation was prepared with Dragon dictation along with smaller phrase technology. Any transcriptional errors that result from this process are  unintentional.

## 2018-01-11 LAB — BASIC METABOLIC PANEL
Anion gap: 7 (ref 5–15)
BUN: 24 mg/dL — ABNORMAL HIGH (ref 6–20)
CO2: 26 mmol/L (ref 22–32)
Calcium: 8.9 mg/dL (ref 8.9–10.3)
Chloride: 108 mmol/L (ref 98–111)
Creatinine, Ser: 1.08 mg/dL (ref 0.61–1.24)
GFR calc non Af Amer: >60 mL/min (ref >60)
Glucose, Bld: 157 mg/dL — ABNORMAL HIGH (ref 70–99)
Potassium: 3.7 mmol/L (ref 3.5–5.1)
SODIUM: 141 mmol/L (ref 135–145)

## 2018-01-11 LAB — CBC
HEMATOCRIT: 36.7 % — AB (ref 39.0–52.0)
Hemoglobin: 11.8 g/dL — ABNORMAL LOW (ref 13.0–17.0)
MCH: 29.5 pg (ref 26.0–34.0)
MCHC: 32.2 g/dL (ref 30.0–36.0)
MCV: 91.8 fL (ref 80.0–100.0)
Platelets: 162 10*3/uL (ref 150–400)
RBC: 4 MIL/uL — ABNORMAL LOW (ref 4.22–5.81)
RDW: 13.8 % (ref 11.5–15.5)
WBC: 8 10*3/uL (ref 4.0–10.5)
nRBC: 0 % (ref 0.0–0.2)

## 2018-01-11 MED ORDER — IPRATROPIUM-ALBUTEROL 0.5-2.5 (3) MG/3ML IN SOLN
3.0000 mL | Freq: Four times a day (QID) | RESPIRATORY_TRACT | Status: DC
  Administered 2018-01-11 – 2018-01-12 (×4): 3 mL via RESPIRATORY_TRACT
  Filled 2018-01-11 (×5): qty 3

## 2018-01-11 MED ORDER — GUAIFENESIN-DM 100-10 MG/5ML PO SYRP
5.0000 mL | ORAL_SOLUTION | ORAL | Status: DC | PRN
  Administered 2018-01-11: 5 mL via ORAL
  Filled 2018-01-11: qty 5

## 2018-01-11 MED ORDER — CARVEDILOL 25 MG PO TABS
25.0000 mg | ORAL_TABLET | Freq: Two times a day (BID) | ORAL | Status: DC
  Administered 2018-01-11 – 2018-01-12 (×3): 25 mg via ORAL
  Filled 2018-01-11 (×3): qty 1

## 2018-01-11 NOTE — Progress Notes (Signed)
Advanced Care Plan.  Purpose of Encounter: CODE STATUS. Parties in Attendance: The patient, his sister and me. Patient's Decisional Capacity: Yes. Medical Story: Christopher Bryan  is a 57 y.o. male with a known history of HTN, COPD, questionable CHF, PE (Eliquis) p/w acute hypoxemic respiratory failure.  He was intubated and admitted to ICU.  He is extubated and transfer to floor yesterday.  He has been treated with antibiotics and nebulizer.  He is off oxygen today.  I discussed with the patient about his current condition, prognosis and CODE STATUS.  The patient initially stated that he does not want to be resuscitated but wants to be intubated.  After explanation of CPR and intubation, he decided that he want to be resuscitated and intubated, he want ACLS medication but not shock. Plan:  Code Status: Limited code. Time spent discussing advance care planning: 20 minutes.

## 2018-01-11 NOTE — Progress Notes (Addendum)
[employer] [facility] - [place] at [facility]   PATIENT NAME: Christopher Bryan    MR#:  [id number]  DATE OF BIRTH:  [birth year]  SUBJECTIVE:  CHIEF COMPLAINT:   Chief Complaint  Patient presents with  . Respiratory Distress  Patient still has cough, wheezing, shortness of breath, off oxygen today. REVIEW OF SYSTEMS:  Review of Systems  Constitutional: Negative for chills, fever, malaise/fatigue and weight loss.  HENT: Negative for nosebleeds and sore throat.   Eyes: Negative for blurred vision and double vision.  Respiratory: Positive for cough, shortness of breath and wheezing. Negative for hemoptysis, sputum production and stridor.   Cardiovascular: Negative for chest pain, palpitations, orthopnea, leg swelling and PND.  Gastrointestinal: Negative for abdominal pain, blood in stool, constipation, diarrhea, heartburn, melena, nausea and vomiting.  Genitourinary: Negative for dysuria, flank pain, hematuria and urgency.  Musculoskeletal: Negative for back pain and joint pain.  Skin: Negative for rash.  Neurological: Negative for dizziness, sensory change, speech change, focal weakness, seizures, loss of consciousness, weakness and headaches.  Endo/Heme/Allergies: Negative for polydipsia. Does not bruise/bleed easily.  Psychiatric/Behavioral: Negative for depression. The patient is not nervous/anxious.    DRUG ALLERGIES:   Allergies  Allergen Reactions  . Ivp Dye [Iodinated Diagnostic Agents] Hives   VITALS:  Blood pressure (!) 172/71, pulse 68, temperature 98.6 F (37 C), temperature source Oral, resp. rate 17, height 5\' 10"  (1.778 m), weight 57.3 kg, SpO2 97 %. PHYSICAL EXAMINATION:  Physical Exam Constitutional:      General: He is not in acute distress. HENT:     Head: Normocephalic and atraumatic.     Mouth/Throat:     Mouth: Oropharynx is clear and moist.  Eyes:     General: No scleral icterus.    Extraocular Movements: EOM normal.     Conjunctiva/sclera:  Conjunctivae normal.     Pupils: Pupils are equal, round, and reactive to light.  Neck:     Musculoskeletal: Normal range of motion and neck supple.     Thyroid: No thyromegaly.     Vascular: No JVD.     Trachea: No tracheal deviation.  Cardiovascular:     Rate and Rhythm: Normal rate and regular rhythm.     Heart sounds: Normal heart sounds. No murmur. No gallop.   Pulmonary:     Effort: Pulmonary effort is normal. No respiratory distress.     Breath sounds: Wheezing present. No rales.  Chest:     Chest wall: No tenderness.  Abdominal:     General: Bowel sounds are normal. There is no distension.     Palpations: Abdomen is soft.     Tenderness: There is no abdominal tenderness. There is no rebound.  Musculoskeletal: Normal range of motion.        General: No tenderness or edema.  Skin:    General: Skin is warm and dry.     Findings: No erythema or rash.  Neurological:     Mental Status: He is alert and oriented to person, place, and time.     Cranial Nerves: No cranial nerve deficit.  Psychiatric:        Mood and Affect: Mood and affect normal.    LABORATORY PANEL:  Male CBC Recent Labs  Lab 01/11/18 0356  WBC 8.0  HGB 11.8*  HCT 36.7*  PLT 162   ------------------------------------------------------------------------------------------------------------------ Chemistries  Recent Labs  Lab 01/08/18 0445  01/11/18 0356  NA 142   < > 141  K 4.1   < >  3.7  CL 107   < > 108  CO2 29   < > 26  GLUCOSE 117*   < > 157*  BUN 14   < > 24*  CREATININE 1.10   < > 1.08  CALCIUM 9.0   < > 8.9  MG 1.7  --   --   AST 24  --   --   ALT 14  --   --   ALKPHOS 83  --   --   BILITOT 0.5  --   --    < > = values in this interval not displayed.   RADIOLOGY:  No results found. ASSESSMENT AND PLAN:   1.  Acute ventilator dependent respiratory failure, both hypoxic and hypercarbic - due to exacerbation of COPD.   Off oxygen.  Continue DuoNeb as needed, Robitussin as  needed.  2.  COPD exacerbation:  continue nebs, steroids, Abx  3. Sinus tach: Improved.  4 Hypotension - resolved.  Hypertension.  Resumed Coreg, lisinopril, prn hydralazine History of PE.  Continue Eliquis.  History of chronic diastolic CHF ejection fraction 60 to 65%.  Stable.  Questionable 4 mm aneurysm arising from the proximal M1 segment of the right middle cerebral artery. This finding may warrant nonemergent CT angiography for further assessment.  But the patient has allergy to contrast dye.  He is not a good candidate for CTA due to above medical problems this time.  He may follow-up as outpatient.  Severe malnutrition.  Dietitian consult. Tobacco abuse.  Smoking cessation was counseled for 3 to 4 minutes.  Nicotine patch. All the records are reviewed and case discussed with Care Management/Social Worker. Management plans discussed with the patient, his sister POA, nursing and they are in agreement.  CODE STATUS: Partial Code  TOTAL TIME TAKING CARE OF THIS PATIENT: 28 minutes.   More than 50% of the time was spent in counseling/coordination of care: YES  POSSIBLE D/C IN 1-2 DAYS, DEPENDING ON CLINICAL CONDITION.   Shaune Pollack M.D on 01/11/2018 at 2:47 PM  Between 7am to 6pm - Pager - [phone]  After 6pm go to [email or web address] - Social research officer, government  [facility] [place] [facility]  Office  [phone]  CC: Primary care physician; [facility]  Note: This dictation was prepared with Dragon dictation along with smaller phrase technology. Any transcriptional errors that result from this process are unintentional.

## 2018-01-11 NOTE — Care Management Important Message (Signed)
Initial Medicare IM signed.  Blank copy left in room for patient reference.   

## 2018-01-12 MED ORDER — CARVEDILOL 25 MG PO TABS: 25.0000 mg | ORAL_TABLET | Freq: Two times a day (BID) | ORAL | 1 refills | Status: DC

## 2018-01-12 MED ORDER — PREDNISONE 20 MG PO TABS: 40.0000 mg | ORAL_TABLET | Freq: Every day | ORAL | 0 refills | Status: DC

## 2018-01-12 MED ORDER — GUAIFENESIN-DM 100-10 MG/5ML PO SYRP: 5.0000 mL | ORAL_SOLUTION | ORAL | 0 refills | Status: DC | PRN

## 2018-01-12 MED ORDER — SODIUM CHLORIDE 0.9% FLUSH
3.0000 mL | Freq: Two times a day (BID) | INTRAVENOUS | Status: DC
  Administered 2018-01-12: 3 mL via INTRAVENOUS

## 2018-01-12 MED ORDER — ADULT MULTIVITAMIN W/MINERALS CH: 1.0000 | ORAL_TABLET | Freq: Every day | ORAL | Status: DC

## 2018-01-12 MED ORDER — ENSURE ENLIVE PO LIQD: 237.0000 mL | Freq: Two times a day (BID) | ORAL | Status: DC

## 2018-01-12 NOTE — Progress Notes (Signed)
Initial Nutrition Assessment  DOCUMENTATION CODES:   Non-severe (moderate) malnutrition in context of chronic illness, Underweight  INTERVENTION:   - MVI with minerals daily  - Ensure Enlive po BID, each supplement provides 350 kcal and 20 grams of protein  NUTRITION DIAGNOSIS:   Moderate Malnutrition related to chronic illness (COPD, CHF) as evidenced by mild fat depletion, moderate fat depletion, severe fat depletion, mild muscle depletion, moderate muscle depletion.  GOAL:   Patient will meet greater than or equal to 90% of their needs  MONITOR:   PO intake, Supplement acceptance, Weight trends, Labs, I & O's  REASON FOR ASSESSMENT:   Consult Assessment of nutrition requirement/status  ASSESSMENT:   57 year old male who presented to the ED on 12/9 with respiratory distress. PMH significant for HTN, COPD, PE on Eliquis, and CHF. Pt was intubated in the ED.  12/10 - extubated 12/11 - transferred to floor  Spoke with pt at bedside who reports having a good appetite. Noted untouched lunch meal tray at bedside. Pt reports breakfast came late due to waking up late and that lunch tray came shortly after breakfast tray. Pt states that he will eat his lunch but was just still full from breakfast. Pt states he ate 100% of breakfast tray.  Pt shares that he has always been lean. Pt reports his UBW as 135-140 lbs and his highest weight as 145 lbs when he was in the [employer] [phone]([id number]). Weight history in chart is limited; however, weight on 12/12 recorded as 126.4 lbs. Unsure of timeframe of pt's weight loss below UBW.  Pt reports typically eating "all day" and drinking 2 oral nutrition supplements daily. Pt states, "I just can't gain weight." Pt amenable to receiving Ensure Enlive during current admission. RD to order. Will also order MVI with minerals.  Pt states that he was hoping to leave today due to d/c orders but states he may not have a ride due to having 3 family deaths  recently and family members being busy.  Meal Completion: 85-100% x last 6 meals  Medications reviewed and include: Protonix  Labs reviewed: BUN 24 (H)  NUTRITION - FOCUSED PHYSICAL EXAM:    Most Recent Value  Orbital Region  No depletion  Upper Arm Region  Severe depletion  Thoracic and Lumbar Region  Moderate depletion  Buccal Region  Mild depletion  Temple Region  No depletion  Clavicle Bone Region  Mild depletion  Clavicle and Acromion Bone Region  Moderate depletion  Scapular Bone Region  Mild depletion  Dorsal Hand  No depletion  Patellar Region  No depletion  Anterior Thigh Region  Moderate depletion  Posterior Calf Region  No depletion  Edema (RD Assessment)  None  Hair  Reviewed  Eyes  Reviewed  Mouth  Reviewed  Skin  Reviewed  Nails  Reviewed       Diet Order:   Diet Order            Diet - low sodium heart healthy        Diet - low sodium heart healthy        Diet Heart Room service appropriate? Yes; Fluid consistency: Thin  Diet effective now              EDUCATION NEEDS:   Education needs have been addressed  Skin:  Skin Assessment: Reviewed RN Assessment  Last BM:  12/12  Height:   Ht Readings from Last 1 Encounters:  01/08/18 5\' 10"  (1.778 m)    Weight:  Wt Readings from Last 1 Encounters:  01/11/18 57.3 kg    Ideal Body Weight:  75.5 kg  BMI:  Body mass index is 18.14 kg/m.  Estimated Nutritional Needs:   Kcal:  2000-2200  Protein:  90-105 grams  Fluid:  >/= 2.0 L    Earma Reading, MS, RD, LDN Inpatient Clinical Dietitian Pager: [phone] Weekend/After Hours: [phone]

## 2018-01-12 NOTE — Discharge Summary (Signed)
[facility] - [place] at [facility]   PATIENT NAME: Christopher Bryan    MR#:  [id number]  DATE OF BIRTH:  Jul 09, 1960  DATE OF ADMISSION:  01/08/2018   ADMITTING PHYSICIAN: Barbaraann Rondo, MD  DATE OF DISCHARGE: 01/12/2018  PRIMARY CARE PHYSICIAN: [facility]   ADMISSION DIAGNOSIS:  Hypovolemic shock (HCC) [R57.1] COPD exacerbation (HCC) [J44.1] Acute respiratory failure with hypoxia (HCC) [J96.01] DISCHARGE DIAGNOSIS:  Active Problems:   Acute hypoxemic respiratory failure (HCC)  SECONDARY DIAGNOSIS:   Past Medical History:  Diagnosis Date  . Hypertension    HOSPITAL COURSE:  1. Acute ventilator dependent respiratory failure, both hypoxic and hypercarbic - due to exacerbation of COPD.  Off oxygen.  on DuoNeb as needed, Robitussin as needed. Continue home inhaler.  2. COPD exacerbation:  continue nebs, change to po prednisone for 3 more days.  3.Sinus tach: Improved.  4Hypotension - resolved.  Hypertension.  Resumed Coreg (increased to 25 mg bid), lisinopril, prn hydralazine History of PE.  Continue Eliquis.  History of chronic diastolic CHF ejection fraction 60 to 65%.  Stable.  Questionable 4 mm aneurysm arising from the proximal M1 segment of the right middle cerebral artery. This finding may warrant nonemergent CT angiography for further assessment.  But the patient has allergy to contrast dye.  He is not a good candidate for CTA due to above medical problems this time.  [facility].  Severe malnutrition.  follow Dietitian recommendation. Tobacco abuse.  Smoking cessation was counseled for 3 to 4 minutes.  Nicotine patch DISCHARGE CONDITIONS:  Stable, discharge to home today. CONSULTS OBTAINED:  Treatment Team:  Barbaraann Rondo, MD DRUG ALLERGIES:   Allergies  Allergen Reactions  . Ivp Dye [Iodinated Diagnostic Agents] Hives   DISCHARGE MEDICATIONS:   Allergies as of  01/12/2018      Reactions   Ivp Dye [iodinated Diagnostic Agents] Hives      Medication List    STOP taking these medications   doxycycline 100 MG tablet Commonly known as:  VIBRA-TABS   predniSONE 10 MG (21) Tbpk tablet Commonly known as:  STERAPRED UNI-PAK 21 TAB Replaced by:  predniSONE 20 MG tablet   warfarin 5 MG tablet Commonly known as:  COUMADIN     TAKE these medications   acetaminophen 325 MG tablet Commonly known as:  TYLENOL Take 650 mg by mouth 3 (three) times daily as needed for mild pain or fever. Notes to patient:  NONE GIVEN TODAY   albuterol 108 (90 Base) MCG/ACT inhaler Commonly known as:  PROVENTIL HFA;VENTOLIN HFA Inhale 2 puffs into the lungs every 6 (six) hours as needed for wheezing or shortness of breath.   apixaban 5 MG Tabs tablet Commonly known as:  ELIQUIS Take 5 mg by mouth 2 (two) times daily.   aspirin 81 MG EC tablet Take 1 tablet (81 mg total) by mouth daily.   atorvastatin 20 MG tablet Commonly known as:  LIPITOR Take 20 mg by mouth at bedtime.   budesonide-formoterol 160-4.5 MCG/ACT inhaler Commonly known as:  SYMBICORT Inhale 2 puffs into the lungs 2 (two) times daily.   carboxymethylcellulose 0.5 % Soln Commonly known as:  REFRESH PLUS Place 1 drop into both eyes 4 (four) times daily as needed.   carvedilol 12.5 MG tablet Commonly known as:  COREG Take 12.5 mg by mouth 2 (two) times daily with a meal.   cholecalciferol 1000 units tablet Commonly known as:  VITAMIN D Take  2,000 Units by mouth daily.   ENSURE Take 119 mLs by mouth 2 (two) times daily. Mix with milk or water.   famotidine 20 MG tablet Commonly known as:  PEPCID Take 20 mg by mouth 2 (two) times daily as needed for heartburn or indigestion.   gabapentin 800 MG tablet Commonly known as:  NEURONTIN Take 800 mg by mouth 3 (three) times daily.   guaiFENesin-dextromethorphan 100-10 MG/5ML syrup Commonly known as:  ROBITUSSIN DM Take 5 mLs by mouth every  4 (four) hours as needed for cough.   ipratropium-albuterol 0.5-2.5 (3) MG/3ML Soln Commonly known as:  DUONEB Take 3 mLs by nebulization every 6 (six) hours as needed.   lisinopril 10 MG tablet Commonly known as:  PRINIVIL,ZESTRIL Take 20 mg by mouth daily.   loratadine 10 MG tablet Commonly known as:  CLARITIN Take 10 mg by mouth daily as needed for allergies.   nicotine 21 mg/24hr patch Commonly known as:  NICODERM CQ - dosed in mg/24 hours Place 1 patch (21 mg total) onto the skin daily. Notes to patient:  ROTATE WHERE YOU PUT IT ON YOUR SKIN   predniSONE 20 MG tablet Commonly known as:  DELTASONE Take 2 tablets (40 mg total) by mouth daily with breakfast. Start taking on:  January 13, 2018 Replaces:  predniSONE 10 MG (21) Tbpk tablet   sennosides-docusate sodium 8.6-50 MG tablet Commonly known as:  SENOKOT-S Take 1-2 tablets by mouth at bedtime as needed for constipation.   sildenafil 100 MG tablet Commonly known as:  VIAGRA Take 100 mg by mouth daily as needed for erectile dysfunction.   tiotropium 18 MCG inhalation capsule Commonly known as:  SPIRIVA Place 18 mcg into inhaler and inhale daily.   traMADol 50 MG tablet Commonly known as:  ULTRAM Take 50 mg by mouth 2 (two) times daily as needed for moderate pain.   traZODone 50 MG tablet Commonly known as:  DESYREL Take 25 mg by mouth at bedtime.        DISCHARGE INSTRUCTIONS:  See AVS.  If you experience worsening of your admission symptoms, develop shortness of breath, life threatening emergency, suicidal or homicidal thoughts you must seek medical attention immediately by calling 911 or calling your MD immediately  if symptoms less severe.  You Must read complete instructions/literature along with all the possible adverse reactions/side effects for all the Medicines you take and that have been prescribed to you. Take any new Medicines after you have completely understood and accpet all the possible  adverse reactions/side effects.   Please note  You were cared for by a hospitalist during your hospital stay. If you have any questions about your discharge medications or the care you received while you were in the hospital after you are discharged, you can call the unit and asked to speak with the hospitalist on call if the hospitalist that took care of you is not available. Once you are discharged, your primary care physician will handle any further medical issues. Please note that NO REFILLS for any discharge medications will be authorized once you are discharged, as it is imperative that you return to your primary care physician (or establish a relationship with a primary care physician if you do not have one) for your aftercare needs so that they can reassess your need for medications and monitor your lab values.    On the day of Discharge:  VITAL SIGNS:  Blood pressure (!) 177/71, pulse 63, temperature 98.4 F (36.9 C), temperature source  Oral, resp. rate 20, height 5\' 10"  (1.778 m), weight 57.3 kg, SpO2 94 %. PHYSICAL EXAMINATION:  GENERAL:  56 y.o.-year-old patient lying in the bed with no acute distress. Severe malnutrition. EYES: Pupils equal, round, reactive to light and accommodation. No scleral icterus. Extraocular muscles intact.  HEENT: Head atraumatic, normocephalic. Oropharynx and nasopharynx clear.  NECK:  Supple, no jugular venous distention. No thyroid enlargement, no tenderness.  LUNGS: Normal breath sounds bilaterally, no wheezing, rales,rhonchi or crepitation. No use of accessory muscles of respiration.  CARDIOVASCULAR: S1, S2 normal. No murmurs, rubs, or gallops.  ABDOMEN: Soft, non-tender, non-distended. Bowel sounds present. No organomegaly or mass.  EXTREMITIES: No pedal edema, cyanosis, or clubbing.  NEUROLOGIC: Cranial nerves II through XII are intact. Muscle strength 5/5 in all extremities. Sensation intact. Gait not checked.  PSYCHIATRIC: The patient is alert and  oriented x 3.  SKIN: No obvious rash, lesion, or ulcer.  DATA REVIEW:   CBC Recent Labs  Lab 01/11/18 0356  WBC 8.0  HGB 11.8*  HCT 36.7*  PLT 162    Chemistries  Recent Labs  Lab 01/08/18 0445  01/11/18 0356  NA 142   < > 141  K 4.1   < > 3.7  CL 107   < > 108  CO2 29   < > 26  GLUCOSE 117*   < > 157*  BUN 14   < > 24*  CREATININE 1.10   < > 1.08  CALCIUM 9.0   < > 8.9  MG 1.7  --   --   AST 24  --   --   ALT 14  --   --   ALKPHOS 83  --   --   BILITOT 0.5  --   --    < > = values in this interval not displayed.     Microbiology Results  Results for orders placed or performed during the hospital encounter of 01/08/18  MRSA PCR Screening     Status: None   Collection Time: 01/08/18 10:33 AM  Result Value Ref Range Status   MRSA by PCR NEGATIVE NEGATIVE Final    Comment:        The GeneXpert MRSA Assay (FDA approved for NASAL specimens only), is one component of a comprehensive MRSA colonization surveillance program. It is not intended to diagnose MRSA infection nor to guide or monitor treatment for MRSA infections. Performed at [facility], [address] [place], [place] [address]     RADIOLOGY:  No results found.   Management plans discussed with the patient, family and they are in agreement.  CODE STATUS: Partial Code   TOTAL TIME TAKING CARE OF THIS PATIENT: 37 minutes.    Shaune Pollack M.D on 01/12/2018 at 3:39 PM  Between 7am to 6pm - Pager - [phone]  After 6pm go to [email or web address] - Social research officer, government  [facility] [place] [facility]  Office  [phone]  CC: Primary care physician; [facility]   Note: This dictation was prepared with Dragon dictation along with smaller phrase technology. Any transcriptional errors that result from this process are unintentional.

## 2018-01-12 NOTE — Discharge Instructions (Signed)
Smoking cessation. [place].

## 2018-01-12 NOTE — Progress Notes (Signed)
Discharged to home with his Sister.  [facility]  Prescriptions provided.

## 2020-01-20 ENCOUNTER — Other Ambulatory Visit: Payer: Self-pay

## 2020-01-20 ENCOUNTER — Encounter: Payer: Self-pay | Admitting: Emergency Medicine

## 2020-01-20 ENCOUNTER — Inpatient Hospital Stay
Admission: EM | Admit: 2020-01-20 | Discharge: 2020-01-24 | DRG: 270 | Disposition: A | Discharge status: Home / Self Care | Payer: No Typology Code available for payment source | Attending: Internal Medicine | Admitting: Internal Medicine

## 2020-01-20 ENCOUNTER — Emergency Department: Payer: No Typology Code available for payment source

## 2020-01-20 DIAGNOSIS — E785 Hyperlipidemia, unspecified: Secondary | ICD-10-CM | POA: Diagnosis present

## 2020-01-20 DIAGNOSIS — Z7952 Long term (current) use of systemic steroids: Secondary | ICD-10-CM

## 2020-01-20 DIAGNOSIS — M79605 Pain in left leg: Secondary | ICD-10-CM | POA: Diagnosis not present

## 2020-01-20 DIAGNOSIS — I70209 Unspecified atherosclerosis of native arteries of extremities, unspecified extremity: Secondary | ICD-10-CM | POA: Diagnosis not present

## 2020-01-20 DIAGNOSIS — Z86718 Personal history of other venous thrombosis and embolism: Secondary | ICD-10-CM

## 2020-01-20 DIAGNOSIS — F17200 Nicotine dependence, unspecified, uncomplicated: Secondary | ICD-10-CM | POA: Diagnosis present

## 2020-01-20 DIAGNOSIS — N179 Acute kidney failure, unspecified: Secondary | ICD-10-CM | POA: Diagnosis present

## 2020-01-20 DIAGNOSIS — R5381 Other malaise: Secondary | ICD-10-CM | POA: Diagnosis not present

## 2020-01-20 DIAGNOSIS — Z7951 Long term (current) use of inhaled steroids: Secondary | ICD-10-CM

## 2020-01-20 DIAGNOSIS — I5032 Chronic diastolic (congestive) heart failure: Secondary | ICD-10-CM | POA: Diagnosis present

## 2020-01-20 DIAGNOSIS — E860 Dehydration: Secondary | ICD-10-CM | POA: Diagnosis present

## 2020-01-20 DIAGNOSIS — I708 Atherosclerosis of other arteries: Secondary | ICD-10-CM | POA: Diagnosis present

## 2020-01-20 DIAGNOSIS — Z7901 Long term (current) use of anticoagulants: Secondary | ICD-10-CM

## 2020-01-20 DIAGNOSIS — F17210 Nicotine dependence, cigarettes, uncomplicated: Secondary | ICD-10-CM | POA: Diagnosis present

## 2020-01-20 DIAGNOSIS — T82868A Thrombosis of vascular prosthetic devices, implants and grafts, initial encounter: Principal | ICD-10-CM | POA: Diagnosis present

## 2020-01-20 DIAGNOSIS — R9431 Abnormal electrocardiogram [ECG] [EKG]: Secondary | ICD-10-CM | POA: Diagnosis not present

## 2020-01-20 DIAGNOSIS — I110 Hypertensive heart disease with heart failure: Secondary | ICD-10-CM | POA: Diagnosis present

## 2020-01-20 DIAGNOSIS — Y832 Surgical operation with anastomosis, bypass or graft as the cause of abnormal reaction of the patient, or of later complication, without mention of misadventure at the time of the procedure: Secondary | ICD-10-CM | POA: Diagnosis present

## 2020-01-20 DIAGNOSIS — Z8249 Family history of ischemic heart disease and other diseases of the circulatory system: Secondary | ICD-10-CM

## 2020-01-20 DIAGNOSIS — M62262 Nontraumatic ischemic infarction of muscle, left lower leg: Secondary | ICD-10-CM | POA: Insufficient documentation

## 2020-01-20 DIAGNOSIS — Z681 Body mass index (BMI) 19 or less, adult: Secondary | ICD-10-CM

## 2020-01-20 DIAGNOSIS — Z79899 Other long term (current) drug therapy: Secondary | ICD-10-CM | POA: Diagnosis not present

## 2020-01-20 DIAGNOSIS — J439 Emphysema, unspecified: Secondary | ICD-10-CM | POA: Diagnosis present

## 2020-01-20 DIAGNOSIS — Z66 Do not resuscitate: Secondary | ICD-10-CM | POA: Diagnosis present

## 2020-01-20 DIAGNOSIS — I82411 Acute embolism and thrombosis of right femoral vein: Secondary | ICD-10-CM | POA: Diagnosis not present

## 2020-01-20 DIAGNOSIS — Z825 Family history of asthma and other chronic lower respiratory diseases: Secondary | ICD-10-CM

## 2020-01-20 DIAGNOSIS — I10 Essential (primary) hypertension: Secondary | ICD-10-CM | POA: Diagnosis present

## 2020-01-20 DIAGNOSIS — Z20822 Contact with and (suspected) exposure to covid-19: Secondary | ICD-10-CM | POA: Diagnosis present

## 2020-01-20 DIAGNOSIS — I70201 Unspecified atherosclerosis of native arteries of extremities, right leg: Secondary | ICD-10-CM | POA: Diagnosis present

## 2020-01-20 DIAGNOSIS — Z7982 Long term (current) use of aspirin: Secondary | ICD-10-CM | POA: Diagnosis not present

## 2020-01-20 DIAGNOSIS — I70222 Atherosclerosis of native arteries of extremities with rest pain, left leg: Secondary | ICD-10-CM | POA: Diagnosis present

## 2020-01-20 DIAGNOSIS — I998 Other disorder of circulatory system: Secondary | ICD-10-CM

## 2020-01-20 DIAGNOSIS — Z91041 Radiographic dye allergy status: Secondary | ICD-10-CM

## 2020-01-20 DIAGNOSIS — E43 Unspecified severe protein-calorie malnutrition: Secondary | ICD-10-CM | POA: Insufficient documentation

## 2020-01-20 DIAGNOSIS — I739 Peripheral vascular disease, unspecified: Secondary | ICD-10-CM | POA: Diagnosis not present

## 2020-01-20 DIAGNOSIS — R404 Transient alteration of awareness: Secondary | ICD-10-CM | POA: Diagnosis not present

## 2020-01-20 DIAGNOSIS — M79662 Pain in left lower leg: Secondary | ICD-10-CM | POA: Diagnosis not present

## 2020-01-20 DIAGNOSIS — I743 Embolism and thrombosis of arteries of the lower extremities: Secondary | ICD-10-CM | POA: Diagnosis not present

## 2020-01-20 HISTORY — DX: Acute embolism and thrombosis of unspecified deep veins of unspecified lower extremity: I82.409

## 2020-01-20 LAB — BASIC METABOLIC PANEL
Anion gap: 8 (ref 5–15)
BUN: 30 mg/dL — ABNORMAL HIGH (ref 8–23)
CO2: 28 mmol/L (ref 22–32)
Calcium: 9.4 mg/dL (ref 8.9–10.3)
Chloride: 103 mmol/L (ref 98–111)
Creatinine, Ser: 1.5 mg/dL — ABNORMAL HIGH (ref 0.61–1.24)
GFR, Estimated: 53 mL/min — ABNORMAL LOW (ref >60)
Glucose, Bld: 93 mg/dL (ref 70–99)
Potassium: 4.8 mmol/L (ref 3.5–5.1)
Sodium: 139 mmol/L (ref 135–145)

## 2020-01-20 LAB — CBC
HCT: 43.8 % (ref 39.0–52.0)
Hemoglobin: 14 g/dL (ref 13.0–17.0)
MCH: 29.4 pg (ref 26.0–34.0)
MCHC: 32 g/dL (ref 30.0–36.0)
MCV: 92 fL (ref 80.0–100.0)
Platelets: 200 10*3/uL (ref 150–400)
RBC: 4.76 MIL/uL (ref 4.22–5.81)
RDW: 12.4 % (ref 11.5–15.5)
WBC: 4.3 10*3/uL (ref 4.0–10.5)
nRBC: 0 % (ref 0.0–0.2)

## 2020-01-20 LAB — TROPONIN I (HIGH SENSITIVITY)
Troponin I (High Sensitivity): 17 ng/L (ref <18)
Troponin I (High Sensitivity): 21 ng/L — ABNORMAL HIGH (ref <18)
Troponin I (High Sensitivity): 18 ng/L — ABNORMAL HIGH (ref <18)

## 2020-01-20 LAB — PROTIME-INR
INR: 0.8 (ref 0.8–1.2)
Prothrombin Time: 11.1 seconds — ABNORMAL LOW (ref 11.4–15.2)

## 2020-01-20 LAB — RESP PANEL BY RT-PCR (FLU A&B, COVID) ARPGX2
Influenza A by PCR: NEGATIVE
Influenza B by PCR: NEGATIVE
SARS Coronavirus 2 by RT PCR: NEGATIVE

## 2020-01-20 LAB — HEPARIN LEVEL (UNFRACTIONATED): Heparin Unfractionated: 0.33 IU/mL (ref 0.30–0.70)

## 2020-01-20 LAB — APTT: aPTT: 26 seconds (ref 24–36)

## 2020-01-20 MED ORDER — HYDROMORPHONE HCL 1 MG/ML IJ SOLN: 1.0000 mg | INTRAMUSCULAR | Status: DC | PRN

## 2020-01-20 MED ORDER — POLYVINYL ALCOHOL 1.4 % OP SOLN
1.0000 [drp] | Freq: Three times a day (TID) | OPHTHALMIC | Status: DC | PRN
  Filled 2020-01-20: qty 15

## 2020-01-20 MED ORDER — MORPHINE SULFATE (PF) 4 MG/ML IV SOLN
4.0000 mg | Freq: Once | INTRAVENOUS | Status: AC
  Administered 2020-01-20: 4 mg via INTRAVENOUS
  Filled 2020-01-20: qty 1

## 2020-01-20 MED ORDER — LACTATED RINGERS IV BOLUS
1000.0000 mL | Freq: Once | INTRAVENOUS | Status: AC
  Administered 2020-01-20: 1000 mL via INTRAVENOUS

## 2020-01-20 MED ORDER — MORPHINE SULFATE (PF) 2 MG/ML IV SOLN
2.0000 mg | INTRAVENOUS | Status: DC | PRN
  Administered 2020-01-20: 2 mg via INTRAVENOUS
  Filled 2020-01-20: qty 1

## 2020-01-20 MED ORDER — LISINOPRIL 10 MG PO TABS: 20.0000 mg | ORAL_TABLET | Freq: Every day | ORAL | Status: DC

## 2020-01-20 MED ORDER — GABAPENTIN 400 MG PO CAPS
800.0000 mg | ORAL_CAPSULE | Freq: Three times a day (TID) | ORAL | Status: DC
  Administered 2020-01-20: 22:00:00 800 mg via ORAL
  Filled 2020-01-20: qty 2
  Filled 2020-01-20: qty 8
  Filled 2020-01-20: qty 2

## 2020-01-20 MED ORDER — HEPARIN SODIUM (PORCINE) 5000 UNIT/ML IJ SOLN
60.0000 [IU]/kg | Freq: Once | INTRAMUSCULAR | Status: AC
  Administered 2020-01-20: 3450 [IU] via INTRAVENOUS
  Filled 2020-01-20: qty 1

## 2020-01-20 MED ORDER — ALBUTEROL SULFATE HFA 108 (90 BASE) MCG/ACT IN AERS
2.0000 | INHALATION_SPRAY | Freq: Four times a day (QID) | RESPIRATORY_TRACT | Status: DC | PRN
  Filled 2020-01-20: qty 6.7

## 2020-01-20 MED ORDER — OXYCODONE HCL 5 MG PO TABS
15.0000 mg | ORAL_TABLET | ORAL | Status: DC | PRN
  Administered 2020-01-20: 15 mg via ORAL
  Filled 2020-01-20: qty 3

## 2020-01-20 MED ORDER — ONDANSETRON HCL 4 MG PO TABS: 4.0000 mg | ORAL_TABLET | Freq: Four times a day (QID) | ORAL | Status: DC | PRN

## 2020-01-20 MED ORDER — ACETAMINOPHEN 325 MG PO TABS: 650.0000 mg | ORAL_TABLET | Freq: Three times a day (TID) | ORAL | Status: DC | PRN

## 2020-01-20 MED ORDER — HEPARIN (PORCINE) 25000 UT/250ML-% IV SOLN
1000.0000 [IU]/h | INTRAVENOUS | Status: DC
  Administered 2020-01-20: 1000 [IU]/h via INTRAVENOUS
  Filled 2020-01-20: qty 250

## 2020-01-20 MED ORDER — CARVEDILOL 25 MG PO TABS: 25.0000 mg | ORAL_TABLET | Freq: Two times a day (BID) | ORAL | Status: DC

## 2020-01-20 MED ORDER — TRAZODONE HCL 50 MG PO TABS
25.0000 mg | ORAL_TABLET | Freq: Every day | ORAL | Status: DC
  Administered 2020-01-20: 22:00:00 25 mg via ORAL
  Filled 2020-01-20: qty 1

## 2020-01-20 MED ORDER — ATORVASTATIN CALCIUM 20 MG PO TABS
20.0000 mg | ORAL_TABLET | Freq: Every day | ORAL | Status: DC
  Administered 2020-01-20: 22:00:00 20 mg via ORAL
  Filled 2020-01-20: qty 1

## 2020-01-20 MED ORDER — MOMETASONE FURO-FORMOTEROL FUM 200-5 MCG/ACT IN AERO
2.0000 | INHALATION_SPRAY | Freq: Two times a day (BID) | RESPIRATORY_TRACT | Status: DC
  Administered 2020-01-20: 20:00:00 2 via RESPIRATORY_TRACT
  Filled 2020-01-20: qty 8.8

## 2020-01-20 MED ORDER — LORATADINE 10 MG PO TABS: 10.0000 mg | ORAL_TABLET | Freq: Every day | ORAL | Status: DC | PRN

## 2020-01-20 MED ORDER — SODIUM CHLORIDE 0.45 % IV SOLN: INTRAVENOUS | Status: DC

## 2020-01-20 MED ORDER — SODIUM CHLORIDE 0.9% FLUSH
3.0000 mL | Freq: Two times a day (BID) | INTRAVENOUS | Status: DC
  Administered 2020-01-20: 22:00:00 3 mL via INTRAVENOUS

## 2020-01-20 MED ORDER — SENNOSIDES-DOCUSATE SODIUM 8.6-50 MG PO TABS: 1.0000 | ORAL_TABLET | Freq: Every evening | ORAL | Status: DC | PRN

## 2020-01-20 MED ORDER — TIOTROPIUM BROMIDE MONOHYDRATE 18 MCG IN CAPS: 18.0000 ug | ORAL_CAPSULE | Freq: Every day | RESPIRATORY_TRACT | Status: DC

## 2020-01-20 MED ORDER — TIOTROPIUM BROMIDE MONOHYDRATE 18 MCG IN CAPS
18.0000 ug | ORAL_CAPSULE | Freq: Every day | RESPIRATORY_TRACT | Status: DC
  Filled 2020-01-20: qty 5

## 2020-01-20 MED ORDER — CEFAZOLIN SODIUM-DEXTROSE 2-4 GM/100ML-% IV SOLN
2.0000 g | INTRAVENOUS | Status: AC
  Administered 2020-01-21: 08:00:00 2 g via INTRAVENOUS
  Filled 2020-01-20: qty 100

## 2020-01-20 MED ORDER — SODIUM CHLORIDE 0.9% FLUSH: 3.0000 mL | INTRAVENOUS | Status: DC | PRN

## 2020-01-20 MED ORDER — CARBOXYMETHYLCELLULOSE SODIUM 0.5 % OP SOLN: 1.0000 [drp] | Freq: Three times a day (TID) | OPHTHALMIC | Status: DC | PRN

## 2020-01-20 MED ORDER — FAMOTIDINE 20 MG PO TABS: 20.0000 mg | ORAL_TABLET | Freq: Two times a day (BID) | ORAL | Status: DC | PRN

## 2020-01-20 MED ORDER — PREDNISONE 10 MG PO TABS
50.0000 mg | ORAL_TABLET | Freq: Four times a day (QID) | ORAL | Status: AC
  Administered 2020-01-20 – 2020-01-21 (×3): 50 mg via ORAL
  Filled 2020-01-20 (×3): qty 2

## 2020-01-20 MED ORDER — ONDANSETRON HCL 4 MG/2ML IJ SOLN: 4.0000 mg | Freq: Four times a day (QID) | INTRAMUSCULAR | Status: DC | PRN

## 2020-01-20 MED ORDER — SODIUM CHLORIDE 0.9 % IV SOLN: 250.0000 mL | INTRAVENOUS | Status: DC | PRN

## 2020-01-20 MED ORDER — HYPROMELLOSE (GONIOSCOPIC) 2.5 % OP SOLN: 1.0000 [drp] | Freq: Three times a day (TID) | OPHTHALMIC | Status: DC | PRN

## 2020-01-20 MED ORDER — VITAMIN D 25 MCG (1000 UNIT) PO TABS: 2000.0000 [IU] | ORAL_TABLET | Freq: Every day | ORAL | Status: DC

## 2020-01-20 MED ORDER — DIPHENHYDRAMINE HCL 25 MG PO CAPS
50.0000 mg | ORAL_CAPSULE | Freq: Once | ORAL | Status: AC
  Administered 2020-01-20: 19:00:00 50 mg via ORAL
  Filled 2020-01-20: qty 2

## 2020-01-20 NOTE — ED Triage Notes (Addendum)
Patient to ER from home via [facility] for c/o left leg pain. While on the way, reports shortness of breath and chest pain (left sided). Patient has h/o frequent blood clots in legs, takes Lovenox. Patient reports he was out of Lovenox for one week, but has started back on it. Patient's vitals per EMS: 150/83, 76HR, 98% O2 RA. CBG 119. Denies swelling in extremity.  Leg pain/numbness x4 days. Chest pain began this am.

## 2020-01-20 NOTE — ED Provider Notes (Signed)
[facility] Emergency Department Provider Note ____________________________________________   Event Date/Time   First MD Initiated Contact with Patient 01/20/20 1603     (approximate)  I have reviewed the triage vital signs and the nursing notes.  HISTORY  Chief Complaint Leg Pain, Chest Pain, and Shortness of Breath   HPI Christopher Bryan. is a 59 y.o. malewho presents to the ED for evaluation of leg pain.   Chart review indicates hx DVT on anticoagulation.  HTN, HLD. Patient self-reports that the majority of his care is through the [place].  He reports a strong history of PAD with multiple bypass surgeries performed through the [place]. Reports most recently admitted 4 months ago for an acute occlusion of his left leg necessitating a right femoral-left femoral bypass surgery.   Patient reports being on Lovenox after this recent surgery.  Reports accidentally not providing himself Lovenox 2 weeks ago for span of 1 week, but he has since been compliant with medications.  Patient reports mild increase to pain, paresthesias of his left distal leg for the past 2 days, acutely worsening this morning with severe pain throughout his left lower leg, ankle and foot that was atraumatic.  Associated cold skin to the touch, paresthesias and tenderness to palpation.  Reports difficulty walking due to the pain.  Denies falls or trauma.  Reports taking his Lovenox last night, but not this morning.  Reports inability to make it to the [place] to the pain, so he presents here for evaluation.    Past Medical History:  Diagnosis Date  . DVT (deep venous thrombosis) (HCC)   . Hypertension     Patient Active Problem List   Diagnosis Date Noted  . Acute hypoxemic respiratory failure (HCC) 01/08/2018  . Acute on chronic diastolic CHF (congestive heart failure) (HCC) 06/21/2016  . Acute right-sided heart failure (HCC) 06/21/2016    Past Surgical History:  Procedure Laterality Date  .  KNEE ARTHROSCOPY Left   . ORIF FEMUR FRACTURE Right     Prior to Admission medications   Medication Sig Start Date End Date Taking? Authorizing Provider  acetaminophen (TYLENOL) 325 MG tablet Take 650 mg by mouth 3 (three) times daily as needed for mild pain or fever.    [provider]  albuterol (PROVENTIL HFA;VENTOLIN HFA) 108 (90 Base) MCG/ACT inhaler Inhale 2 puffs into the lungs every 6 (six) hours as needed for wheezing or shortness of breath.    [provider]  apixaban (ELIQUIS) 5 MG TABS tablet Take 5 mg by mouth 2 (two) times daily.    [provider]  aspirin EC 81 MG EC tablet Take 1 tablet (81 mg total) by mouth daily. 06/24/16   Enid Baas, MD  atorvastatin (LIPITOR) 20 MG tablet Take 20 mg by mouth at bedtime.    [provider]  budesonide-formoterol (SYMBICORT) 160-4.5 MCG/ACT inhaler Inhale 2 puffs into the lungs 2 (two) times daily. 06/23/16   Enid Baas, MD  carboxymethylcellulose (REFRESH PLUS) 0.5 % SOLN Place 1 drop into both eyes 4 (four) times daily as needed.    [provider]  carvedilol (COREG) 25 MG tablet Take 1 tablet (25 mg total) by mouth 2 (two) times daily with a meal. 01/12/18   Shaune Pollack, MD  cholecalciferol (VITAMIN D) 1000 units tablet Take 2,000 Units by mouth daily.    [provider]  ENSURE (ENSURE) Take 119 mLs by mouth 2 (two) times daily. Mix with milk or water.  [provider]  famotidine (PEPCID) 20 MG tablet Take 20 mg by mouth 2 (two) times daily as needed for heartburn or indigestion.    [provider]  gabapentin (NEURONTIN) 800 MG tablet Take 800 mg by mouth 3 (three) times daily.     [provider]  guaiFENesin-dextromethorphan (ROBITUSSIN DM) 100-10 MG/5ML syrup Take 5 mLs by mouth every 4 (four) hours as needed for cough. 01/12/18   Shaune Pollack, MD  ipratropium-albuterol (DUONEB) 0.5-2.5 (3) MG/3ML SOLN Take 3 mLs by nebulization every 6  (six) hours as needed. Patient not taking: Reported on 01/08/2018 06/23/16   Enid Baas, MD  lisinopril (PRINIVIL,ZESTRIL) 10 MG tablet Take 20 mg by mouth daily.     [provider]  loratadine (CLARITIN) 10 MG tablet Take 10 mg by mouth daily as needed for allergies.    [provider]  nicotine (NICODERM CQ - DOSED IN MG/24 HOURS) 21 mg/24hr patch Place 1 patch (21 mg total) onto the skin daily. 06/24/16   Enid Baas, MD  predniSONE (DELTASONE) 20 MG tablet Take 2 tablets (40 mg total) by mouth daily with breakfast. 01/13/18   Shaune Pollack, MD  sennosides-docusate sodium (SENOKOT-S) 8.6-50 MG tablet Take 1-2 tablets by mouth at bedtime as needed for constipation.    [provider]  sildenafil (VIAGRA) 100 MG tablet Take 100 mg by mouth daily as needed for erectile dysfunction.    [provider]  tiotropium (SPIRIVA) 18 MCG inhalation capsule Place 18 mcg into inhaler and inhale daily.    [provider]  traMADol (ULTRAM) 50 MG tablet Take 50 mg by mouth 2 (two) times daily as needed for moderate pain.    [provider]  traZODone (DESYREL) 50 MG tablet Take 25 mg by mouth at bedtime.    [provider]    Allergies Ivp dye [iodinated diagnostic agents]  Family History  Problem Relation Age of Onset  . CAD Mother   . Emphysema Father     Social History Social History   Tobacco Use  . Smoking status: Current Some Day Smoker  . Smokeless tobacco: Never Used  Substance Use Topics  . Alcohol use: Yes  . Drug use: No    Review of Systems  Constitutional: No fever/chills Eyes: No visual changes. ENT: No sore throat. Cardiovascular: Denies chest pain. Acute atraumatic left leg pain and numbness and cold Respiratory: Denies shortness of breath. Gastrointestinal: No abdominal pain.  No nausea, no vomiting.  No diarrhea.  No constipation. Genitourinary: Negative for dysuria. Musculoskeletal: Negative for  back pain. Skin: Negative for rash. Neurological: Negative for headaches, focal weakness or numbness.  ____________________________________________   PHYSICAL EXAM:  VITAL SIGNS: Vitals:   01/20/20 1358 01/20/20 1621  BP: (!) 159/82 (!) 155/88  Pulse: 70 75  Resp: 19 17  Temp:    SpO2: 98% 98%     Constitutional: Alert and oriented. Well appearing and in no acute distress. Eyes: Conjunctivae are normal. PERRL. EOMI. Head: Atraumatic. Nose: No congestion/rhinnorhea. Mouth/Throat: Mucous membranes are moist.  Oropharynx non-erythematous. Neck: No stridor. No cervical spine tenderness to palpation. Cardiovascular: Normal rate, regular rhythm. Grossly normal heart sounds.    Fem/Fem bypass noted over his pubis with strong and palpable pulse. Bilateral thighs are warm, symmetric and nontender to palpation. Beginning at the left knee/popliteal fossa, skin is cool with slow capillary refill and tender to palpation without swelling, signs of trauma or overlying skin changes.  This is present throughout the distal  left leg throughout the calf, shin, ankle and foot.  Respiratory: Normal respiratory effort.  No retractions. Lungs CTAB. Gastrointestinal: Soft , nondistended, nontender to palpation. No CVA tenderness. Musculoskeletal:  No joint effusions. No signs of acute trauma. Neurologic:  Normal speech and language.  Cranial nerves II through XII intact. Decreased sensation to his left distal leg in the distribution of his vascular pathology.  Otherwise neurologically intact Skin:  Skin is warm, dry and intact. No rash noted. Psychiatric: Mood and affect are normal. Speech and behavior are normal.  ____________________________________________   LABS (all labs ordered are listed, but only abnormal results are displayed)  Labs Reviewed  BASIC METABOLIC PANEL - Abnormal; Notable for the following components:      Result Value   BUN 30 (*)    Creatinine, Ser 1.50 (*)    GFR,  Estimated 53 (*)    All other components within normal limits  PROTIME-INR - Abnormal; Notable for the following components:   Prothrombin Time 11.1 (*)    All other components within normal limits  TROPONIN I (HIGH SENSITIVITY) - Abnormal; Notable for the following components:   Troponin I (High Sensitivity) 21 (*)    All other components within normal limits  TROPONIN I (HIGH SENSITIVITY) - Abnormal; Notable for the following components:   Troponin I (High Sensitivity) 18 (*)    All other components within normal limits  RESP PANEL BY RT-PCR (FLU A&B, COVID) ARPGX2  CBC  APTT  HEPARIN LEVEL (UNFRACTIONATED)  HEPARIN LEVEL (UNFRACTIONATED)  TROPONIN I (HIGH SENSITIVITY)   ____________________________________________  12 Lead EKG  Sinus rhythm, rate of 78 bpm.  Rightward axis.  Normal intervals.  Lateral and inferior T wave inversions with ST depressions, no STEMI criteria. Very similar when compared to EKG from 12/2017. ____________________________________________  RADIOLOGY  ED MD interpretation:    Official radiology report(s): US Venous Img Lower Unilateral Left  Result Date: 01/20/2020 CLINICAL DATA:  59 year old male with left lower extremity pain, history of DVT. EXAM: LEFT LOWER EXTREMITY VENOUS DOPPLER ULTRASOUND TECHNIQUE: Gray-scale sonography with graded compression, as well as color Doppler and duplex ultrasound were performed to evaluate the left lower extremity deep venous systems from the level of the common femoral vein and including the common femoral, femoral, profunda femoral, popliteal and calf veins including the posterior tibial, peroneal and gastrocnemius veins when visible. Spectral Doppler was utilized to evaluate flow at rest and with distal augmentation maneuvers in the common femoral, femoral and popliteal veins. The contralateral common femoral vein was also evaluated for comparison. COMPARISON:  05/26/2010 FINDINGS: LEFT LOWER EXTREMITY Common Femoral  Vein: No evidence of thrombus. Normal compressibility, respiratory phasicity and response to augmentation. Central Greater Saphenous Vein: No evidence of thrombus. Normal compressibility and flow on color Doppler imaging. Central Profunda Femoral Vein: No evidence of thrombus. Normal compressibility and flow on color Doppler imaging. Femoral Vein: No evidence of thrombus. Normal compressibility, respiratory phasicity and response to augmentation. Popliteal Vein: No evidence of thrombus. Normal compressibility, respiratory phasicity and response to augmentation. Calf Veins: No evidence of thrombus. Normal compressibility and flow on color Doppler imaging. Other Findings: No significant arterial flow visualized throughout the left lower extremity. RIGHT LOWER EXTREMITY Common Femoral Vein: Persistent synechiae in the right common femoral vein. Partially thrombosed right common femoral artery. IMPRESSION: 1. No evidence of left lower extremity deep vein thrombosis. 2. No significant arterial flow visualized in the left lower extremity on this study. Consider arterial duplex study for further characterization. Marliss Coots,  MD [facility] Electronically Signed   By: Marliss Cootsylan  Suttle MD   On: 01/20/2020 12:47    ____________________________________________   PROCEDURES and INTERVENTIONS  Procedure(s) performed (including Critical Care):  .1-3 Lead EKG Interpretation Performed by: Delton PrairieSmith, Demani Mcbrien, MD Authorized by: Delton PrairieSmith, Deklan Minar, MD     Interpretation: normal     ECG rate:  74   ECG rate assessment: normal     Rhythm: sinus rhythm     Ectopy: none     Conduction: normal   .Critical Care Performed by: Delton PrairieSmith, Jamisyn Langer, MD Authorized by: Delton PrairieSmith, Deagen Krass, MD   Critical care provider statement:    Critical care time (minutes):  30   Critical care was necessary to treat or prevent imminent or life-threatening deterioration of the following conditions:   Circulatory failure   Critical care was time spent personally by me on the following activities:  Discussions with consultants, evaluation of patient's response to treatment, examination of patient, ordering and performing treatments and interventions, ordering and review of laboratory studies, ordering and review of radiographic studies, pulse oximetry, re-evaluation of patient's condition, obtaining history from patient or surrogate and review of old charts    Medications  heparin ADULT infusion 100 units/mL (25000 units/28350mL sodium chloride 0.45%) (1,000 Units/hr Intravenous New Bag/Given 01/20/20 1714)  lactated ringers bolus 1,000 mL (1,000 mLs Intravenous New Bag/Given 01/20/20 1619)  morphine 4 MG/ML injection 4 mg (4 mg Intravenous Given 01/20/20 1629)  heparin injection 3,450 Units (3,450 Units Intravenous Given 01/20/20 1639)    ____________________________________________   MDM / ED COURSE    59 year old male with a strong history of PAD and [facility] majority of [facility] [place], presents to the ED with acute left lower leg pain consistent with acute arterial occlusion necessitating heparin drip and vascular surgery intervention.  Normal vitals on room air.  Exam with clinically obvious acutely ischemic left lower leg, primarily beyond his popliteal fossa.  Unfortunately, this was not noted in triage and patient sat in our waiting room for about 6.5 hours prior to being roomed in the hallway prior to my evaluation.  Blood work with mild AKI, likely prerenal with elevated BUN, for which he received a liter of LR.  Venous ultrasound ordered from triage without evidence of DVT.  Arterial ultrasound pending at the time of this writing, but I received a call from radiologist who helped the technician performing study and indicates multiple acute arterial occlusions of his previous bypass grafts.  Vascular surgery saw the patient as well prior to this, and agrees that patient has a  clinically acutely ischemic leg, and reports that they are trying to acquire surgical history from [facility] [place] to better plan operative intervention for his acute ischemia.  We will admit the patient to hospitalist medicine for further work-up and management.   Clinical Course as of 01/20/20 1759  Mon Jan 20, 2020  1636 Discussed with nurse my strong clinical suspicion for an acute arterial occlusion and my recommendation to empirically initiate heparin prior to ultrasound study. [DS]  1705 Callback from Dr. Gilda CreaseSchnier, vascular surgery on call, he will come see the patient. Agrees with empiric heparin [DS]  1743 Callback from Dr. Gilda CreaseSchnier who has evaluated the patient in the ultrasound suite as he is getting his ultrasound performed.  These images not available yet, but he agrees that patient has a clinical acutely ischemic leg and requests hospitalist admission. [DS]  [phone] [facility] on call,  he reports assisting vascular tech with the u/s study and noting occlusion of his fem/fem bypass as well as left-sided fem/pop bypass. [DS]    Clinical Course User Index [DS] Delton Prairie, MD    ____________________________________________   FINAL CLINICAL IMPRESSION(S) / ED DIAGNOSES  Final diagnoses:  Ischemic leg  Left leg pain  PAD (peripheral artery disease) [facility])     ED Discharge Orders    None       Brittney Caraway   Note:  This document was prepared using Dragon voice recognition software and may include unintentional dictation errors.   Delton Prairie, MD 01/20/20 [phone]

## 2020-01-20 NOTE — H&P (View-Only) (Signed)
@LOGO @   MRN :  Christopher Bryan. is a 59 y.o. (24-Sep-1960) male who presents with chief complaint of  Chief Complaint  Patient presents with  . Leg Pain  . Chest Pain  . Shortness of Breath  .  History of Present Illness:  I have asked to evaluate the patient by Dr. 02/26/1960.  The patient is a 59 year old gentleman with an extensive history of vascular surgery including multiple bypass surgeries as well as multiple interventions in association with a significant contrast allergy.  He presents to [facility] with increasing pain of his left lower extremity.  He also notes that his foot feels cold but he can still wiggle his toes.  His most recent revascularization attempt was just 3 months ago all of his previous work was done at the [facility] and this was a femoral to femoral bypass.  He has never had any vascular interventions or surgeries at [facility].  No outpatient medications have been marked as taking for the 01/20/20 encounter [facility] Encounter).    Past Medical History:  Diagnosis Date  . DVT (deep venous thrombosis) (HCC)   . Hypertension     Past Surgical History:  Procedure Laterality Date  . KNEE ARTHROSCOPY Left   . ORIF FEMUR FRACTURE Right     Social History Social History   Tobacco Use  . Smoking status: Current Some Day Smoker  . Smokeless tobacco: Never Used  Substance Use Topics  . Alcohol use: Yes  . Drug use: No    Family History Family History  Problem Relation Age of Onset  . CAD Mother   . Emphysema Father     Allergies  Allergen Reactions  . Ivp Dye [Iodinated Diagnostic Agents] Hives     REVIEW OF SYSTEMS (Negative unless checked)  Constitutional: [] Weight loss  [] Fever  [] Chills Cardiac: [] Chest pain   [] Chest pressure   [] Palpitations   [] Shortness of breath when laying flat   [] Shortness of breath with exertion. Vascular:  [] Pain in legs with walking   [x] Pain in legs at rest  [] History of DVT    [] Phlebitis   [] Swelling in legs   [] Varicose veins   [] Non-healing ulcers Pulmonary:   [] Uses home oxygen   [] Productive cough   [] Hemoptysis   [] Wheeze  [] COPD   [] Asthma Neurologic:  [] Dizziness   [] Seizures   [] History of stroke   [] History of TIA  [] Aphasia   [] Vissual changes   [] Weakness or numbness in arm   [] Weakness or numbness in leg Musculoskeletal:   [] Joint swelling   [] Joint pain   [] Low back pain Hematologic:  [] Easy bruising  [] Easy bleeding   [] Hypercoagulable state   [] Anemic Gastrointestinal:  [] Diarrhea   [] Vomiting  [] Gastroesophageal reflux/heartburn   [] Difficulty swallowing. Genitourinary:  [] Chronic kidney disease   [] Difficult urination  [] Frequent urination   [] Blood in urine Skin:  [] Rashes   [] Ulcers  Psychological:  [] History of anxiety   []  History of major depression.  Physical Examination  Vitals:   01/20/20 1044 01/20/20 1048 01/20/20 1358 01/20/20 1621  BP:  (!) 194/82 (!) 159/82 (!) 155/88  Pulse:  85 70 75  Resp:  20 19 17   Temp:  98.7 F (37.1 C)    TempSrc:  Oral    SpO2:  100% 98% 98%  Weight: 57.3 kg     Height: 5\' 10"  (1.778 m)      Body mass index is 18.13 kg/m. Gen: WD/WN, NAD Head: [place]/AT, No temporalis  wasting.  Ear/Nose/Throat: Hearing grossly intact, nares w/o erythema or drainage Eyes: PER, EOMI, sclera nonicteric.  Neck: Supple, no large masses.   Pulmonary:  Good air movement, no audible wheezing bilaterally, no use of accessory muscles.  Cardiac: RRR, no JVD Vascular: Foot is cool to the touch there is still motor function he still has some sensation as well it is tender to palpation.  There are multiple scars from various previous bypass surgeries.  There is a femorofemoral bypass graft palpable suprapubic positioning. Vessel Right Left  Radial Palpable Palpable  Femoral Palpable  not palpable  Popliteal  not palpable  not palpable  PT  not palpable  not palpable  DP  not palpable  not palpable  Gastrointestinal:  Non-distended. No guarding/no peritoneal signs.  Musculoskeletal: M/S 5/5 throughout.  No deformity or atrophy.  Neurologic: CN 2-12 intact. Symmetrical.  Speech is fluent. Motor exam as listed above. Psychiatric: Judgment intact, Mood & affect appropriate for pt's clinical situation. Dermatologic: No rashes or ulcers noted.  No changes consistent with cellulitis.   CBC Lab Results  Component Value Date   WBC 4.3 01/20/2020   HGB 14.0 01/20/2020   HCT 43.8 01/20/2020   MCV 92.0 01/20/2020   PLT 200 01/20/2020    BMET    Component Value Date/Time   NA 139 01/20/2020 1051   K 4.8 01/20/2020 1051   CL 103 01/20/2020 1051   CO2 28 01/20/2020 1051   GLUCOSE 93 01/20/2020 1051   BUN 30 (H) 01/20/2020 1051   CREATININE 1.50 (H) 01/20/2020 1051   CALCIUM 9.4 01/20/2020 1051   GFRNONAA 53 (L) 01/20/2020 1051   GFRAA >60 01/11/2018 0356   Estimated Creatinine Clearance: 41.9 mL/min (A) (by C-G formula based on SCr of 1.5 mg/dL (H)).  COAG Lab Results  Component Value Date   INR 0.8 01/20/2020   INR 1.04 01/08/2018   INR 2.57 06/23/2016    Radiology US Venous Img Lower Unilateral Left  Result Date: 01/20/2020 CLINICAL DATA:  59 year old male with left lower extremity pain, history of DVT. EXAM: LEFT LOWER EXTREMITY VENOUS DOPPLER ULTRASOUND TECHNIQUE: Gray-scale sonography with graded compression, as well as color Doppler and duplex ultrasound were performed to evaluate the left lower extremity deep venous systems from the level of the common femoral vein and including the common femoral, femoral, profunda femoral, popliteal and calf veins including the posterior tibial, peroneal and gastrocnemius veins when visible. Spectral Doppler was utilized to evaluate flow at rest and with distal augmentation maneuvers in the common femoral, femoral and popliteal veins. The contralateral common femoral vein was also evaluated for comparison. COMPARISON:  05/26/2010 FINDINGS: LEFT LOWER  EXTREMITY Common Femoral Vein: No evidence of thrombus. Normal compressibility, respiratory phasicity and response to augmentation. Central Greater Saphenous Vein: No evidence of thrombus. Normal compressibility and flow on color Doppler imaging. Central Profunda Femoral Vein: No evidence of thrombus. Normal compressibility and flow on color Doppler imaging. Femoral Vein: No evidence of thrombus. Normal compressibility, respiratory phasicity and response to augmentation. Popliteal Vein: No evidence of thrombus. Normal compressibility, respiratory phasicity and response to augmentation. Calf Veins: No evidence of thrombus. Normal compressibility and flow on color Doppler imaging. Other Findings: No significant arterial flow visualized throughout the left lower extremity. RIGHT LOWER EXTREMITY Common Femoral Vein: Persistent synechiae in the right common femoral vein. Partially thrombosed right common femoral artery. IMPRESSION: 1. No evidence of left lower extremity deep vein thrombosis. 2. No significant arterial flow visualized in the left lower extremity  on this study. Consider arterial duplex study for further characterization. Marliss Coots, MD [facility] Electronically Signed   By: Marliss Coots MD   On: 01/20/2020 12:47     Assessment/Plan 1.  Atherosclerotic occlusive disease bilateral lower extremities with rest pain of the left lower extremity: Recommend:  The patient has evidence of severe atherosclerotic changes of both lower extremities with rest pain of the left lower extremity that is associated with preulcerative changes and impending tissue loss of the foot.  This represents a limb threatening ischemia and places the patient at the risk for left limb loss.  Patient should undergo angiography of the left lower extremity with the hope for intervention for limb salvage.  The risks and benefits as well as the alternative therapies was  discussed in detail with the patient.  All questions were answered.  Patient agrees to proceed with left angiography.  There are several complicating factors including his dye allergy as well as the numerous previous vascular interventions and surgeries.  Given these confounding factors I have elected to place him on heparin begin his diet prophylaxis and will move forward with angiography and intervention as the first case tomorrow this will allow adequate time to complete prednisone prophylaxis.  2.  Contrast allergy significant: Patient will be premedicated with 50 mg of prednisone every 6 hours as well as Benadryl.  Additional steroids will be given at the time of his angiography.  3.  COPD: Continue pulmonary medications and aerosols as already ordered, these medications have been reviewed and there are no changes at this time.  4.  Hypertension:Continue antihypertensive medications as already ordered, these medications have been reviewed and there are no changes at this time.          Levora Dredge, MD  01/20/2020 6:03 PM

## 2020-01-20 NOTE — Progress Notes (Signed)
ANTICOAGULATION CONSULT NOTE - Initial Consult  Pharmacy Consult for  Heparin Infusion  Indication: high suspicion for acute arterial occlusion of his left leg  Allergies  Allergen Reactions  . Ivp Dye [Iodinated Diagnostic Agents] Hives    Patient Measurements: Height: 5\' 10"  (177.8 cm) Weight: 57.3 kg (126 lb 5.2 oz) IBW/kg (Calculated) : 73  Vital Signs: Temp: 98.7 F (37.1 C) (12/20 1048) Temp Source: Oral (12/20 1048) BP: 155/88 (12/20 1621) Pulse Rate: 75 (12/20 1621)  Labs: Recent Labs    01/20/20 1051 01/20/20 1400  HGB 14.0  --   HCT 43.8  --   PLT 200  --   CREATININE 1.50*  --   TROPONINIHS 17 21*    Estimated Creatinine Clearance: 41.9 mL/min (A) (by C-G formula based on SCr of 1.5 mg/dL (H)).   Medical History: Past Medical History:  Diagnosis Date  . DVT (deep venous thrombosis) (HCC)   . Hypertension     Assessment: 59 yo old male left leg pain/numbness x 4 days, SOB, and chest pain. Patient has PMH of DVTs. Patient reports he was taking enoxparin prior to admission. He reports running out for about a week, but then restarting the lovenox. Pharmacy has been consulted for heparin infusion dosing and monitoring.     Goal of Therapy:  Heparin level 0.3-0.7 units/ml Monitor platelets by anticoagulation protocol: Yes   Plan:  Baseline labs ordered after bolus was given.  ED provider already ordered 3450 unit bolus x 1  Will start Start heparin infusion at 1000 units/hr Check anti-Xa level in 6 hours and daily while on heparin Continue to monitor H&H and platelets  75, PharmD, BCPS Clinical Pharmacist 01/20/2020 4:52 PM

## 2020-01-20 NOTE — H&P (Addendum)
History and Physical    Christopher Bryan. FMB:[id number] DOB: 1960/06/23 DOA: 01/20/2020  PCP: [facility]   Patient coming from: Home  I have personally briefly reviewed patient's old medical records in [facility]  Chief Complaint: Left leg pain  HPI: Christopher Bryan. is a 59 y.o. male with medical history significant for severe peripheral arterial disease for which he has had multiple surgeries done and is status post recent femorofemoral bypass which was done in August, 2021, history of hypertension, nicotine dependence, chronic diastolic dysfunction CHF and COPD who presents to the emergency room via EMS for evaluation of worsening pain involving his left leg.  Patient states he started having symptoms 5 days prior to his admission but over the last 2 days it started getting worse.  Associated with left leg pain is paresthesia and increasing cold sensation to the left leg.  Patient states he has been unable to ambulate and now has to use an assist device.  He states that he has been on Lovenox  since after his recent surgery but missed 1 week of his recommended therapy and recently started back on his Lovenox treatment. Patient is still able to wiggle his toes. He denies having any chest pain, no shortness of breath, no nausea, no vomiting, no dizziness, no lightheadedness, no fever, no chills, no abdominal pain, no diarrhea, no constipation, no urinary frequency, no nocturia or dysuria, no cough, no headaches or mental status changes. Labs show sodium 139, potassium 4.8, chloride 103, bicarb 28, glucose 93, BUN 30, creatinine 1.5, calcium 9.4, white count 4.3, hemoglobin 14, hematocrit 43.8, MCV 92, RDW 12.4, platelet count 200, PT 11.1, INR 0.8 Respiratory viral panel still pending at the time of this H&P Left lower extremity DVT shows no evidence of left lower extremity deep vein thrombosis. No significant arterial flow visualized in the left lower extremity on  this study. Consider arterial duplex study for further characterization. Twelve-lead EKG reviewed by me shows normal sinus rhythm with LVH    ED Course: Patient is a 59 year old African-American male with a history of severe peripheral arterial disease who presents to the emergency room for worsening left leg pain and is found to have an acute left lower extremity arterial occlusion.  Patient was started on a heparin drip in the emergency room and has been seen by vascular surgery who plans to take patient to the OR in am.  Patient has allergy to contrast and needs to be premedicated prior to procedure.  Review of Systems: As per HPI otherwise all negative.    Past Medical History:  Diagnosis Date  . DVT (deep venous thrombosis) (HCC)   . Hypertension     Past Surgical History:  Procedure Laterality Date  . KNEE ARTHROSCOPY Left   . ORIF FEMUR FRACTURE Right      reports that he has been smoking. He has never used smokeless tobacco. He reports current alcohol use. He reports that he does not use drugs.  Allergies  Allergen Reactions  . Ivp Dye [Iodinated Diagnostic Agents] Hives    Family History  Problem Relation Age of Onset  . CAD Mother   . Emphysema Father      Prior to Admission medications   Medication Sig Start Date End Date Taking? Authorizing Provider  acetaminophen (TYLENOL) 325 MG tablet Take 650 mg by mouth 3 (three) times daily as needed for mild pain or fever.    [provider]  albuterol (PROVENTIL  HFA;VENTOLIN HFA) 108 (90 Base) MCG/ACT inhaler Inhale 2 puffs into the lungs every 6 (six) hours as needed for wheezing or shortness of breath.    [provider]  apixaban (ELIQUIS) 5 MG TABS tablet Take 5 mg by mouth 2 (two) times daily.    [provider]  aspirin EC 81 MG EC tablet Take 1 tablet (81 mg total) by mouth daily. 06/24/16   Enid BaasKalisetti, Radhika, MD  atorvastatin (LIPITOR) 20 MG tablet Take 20 mg by mouth at bedtime.     [provider]  budesonide-formoterol (SYMBICORT) 160-4.5 MCG/ACT inhaler Inhale 2 puffs into the lungs 2 (two) times daily. 06/23/16   Enid BaasKalisetti, Radhika, MD  carboxymethylcellulose (REFRESH PLUS) 0.5 % SOLN Place 1 drop into both eyes 4 (four) times daily as needed.    [provider]  carvedilol (COREG) 25 MG tablet Take 1 tablet (25 mg total) by mouth 2 (two) times daily with a meal. 01/12/18   Shaune Pollackhen, Qing, MD  cholecalciferol (VITAMIN D) 1000 units tablet Take 2,000 Units by mouth daily.    [provider]  ENSURE (ENSURE) Take 119 mLs by mouth 2 (two) times daily. Mix with milk or water.    [provider]  famotidine (PEPCID) 20 MG tablet Take 20 mg by mouth 2 (two) times daily as needed for heartburn or indigestion.    [provider]  gabapentin (NEURONTIN) 800 MG tablet Take 800 mg by mouth 3 (three) times daily.     [provider]  guaiFENesin-dextromethorphan (ROBITUSSIN DM) 100-10 MG/5ML syrup Take 5 mLs by mouth every 4 (four) hours as needed for cough. 01/12/18   Shaune Pollackhen, Qing, MD  ipratropium-albuterol (DUONEB) 0.5-2.5 (3) MG/3ML SOLN Take 3 mLs by nebulization every 6 (six) hours as needed. Patient not taking: Reported on 01/08/2018 06/23/16   Enid BaasKalisetti, Radhika, MD  lisinopril (PRINIVIL,ZESTRIL) 10 MG tablet Take 20 mg by mouth daily.     [provider]  loratadine (CLARITIN) 10 MG tablet Take 10 mg by mouth daily as needed for allergies.    [provider]  nicotine (NICODERM CQ - DOSED IN MG/24 HOURS) 21 mg/24hr patch Place 1 patch (21 mg total) onto the skin daily. 06/24/16   Enid BaasKalisetti, Radhika, MD  predniSONE (DELTASONE) 20 MG tablet Take 2 tablets (40 mg total) by mouth daily with breakfast. 01/13/18   Shaune Pollackhen, Qing, MD  sennosides-docusate sodium (SENOKOT-S) 8.6-50 MG tablet Take 1-2 tablets by mouth at bedtime as needed for constipation.    [provider]  sildenafil (VIAGRA) 100 MG tablet Take 100 mg  by mouth daily as needed for erectile dysfunction.    [provider]  tiotropium (SPIRIVA) 18 MCG inhalation capsule Place 18 mcg into inhaler and inhale daily.    [provider]  traMADol (ULTRAM) 50 MG tablet Take 50 mg by mouth 2 (two) times daily as needed for moderate pain.    [provider]  traZODone (DESYREL) 50 MG tablet Take 25 mg by mouth at bedtime.    [provider]    Physical Exam: Vitals:   01/20/20 1044 01/20/20 1048 01/20/20 1358 01/20/20 1621  BP:  (!) 194/82 (!) 159/82 (!) 155/88  Pulse:  85 70 75  Resp:  20 19 17   Temp:  98.7 F (37.1 C)    TempSrc:  Oral    SpO2:  100% 98% 98%  Weight: 57.3 kg     Height: 5\' 10"  (1.778 m)  Vitals:   01/20/20 1044 01/20/20 1048 01/20/20 1358 01/20/20 1621  BP:  (!) 194/82 (!) 159/82 (!) 155/88  Pulse:  85 70 75  Resp:  20 19 17   Temp:  98.7 F (37.1 C)    TempSrc:  Oral    SpO2:  100% 98% 98%  Weight: 57.3 kg     Height: 5\' 10"  (1.778 m)       Constitutional: NAD, alert and oriented x 3.  Chronically ill-appearing, thin Eyes: PERRL, lids and conjunctivae pallor ENMT: Mucous membranes are moist.  Neck: normal, supple, no masses, no thyromegaly Respiratory: clear to auscultation bilaterally, no wheezing, no crackles. Normal respiratory effort. No accessory muscle use.  Cardiovascular: Regular rate and rhythm, no murmurs / rubs / gallops. No extremity edema. 2+ pedal pulses. No carotid bruits.  Abdomen: no tenderness, no masses palpated. No hepatosplenomegaly. Bowel sounds positive.  Musculoskeletal: no clubbing / cyanosis.  Left foot is cold to touch and tender to palpation Skin: no rashes, lesions, ulcers.  Neurologic: No gross focal neurologic deficit. Psychiatric: Normal mood and affect.   Labs on Admission: I have personally reviewed following labs and imaging studies  CBC: Recent Labs  Lab 01/20/20 1051  WBC 4.3  HGB 14.0  HCT 43.8  MCV 92.0  PLT 200    Basic Metabolic Panel: Recent Labs  Lab 01/20/20 1051  NA 139  K 4.8  CL 103  CO2 28  GLUCOSE 93  BUN 30*  CREATININE 1.50*  CALCIUM 9.4   GFR: Estimated Creatinine Clearance: 41.9 mL/min (A) (by C-G formula based on SCr of 1.5 mg/dL (H)). Liver Function Tests: No results for input(s): AST, ALT, ALKPHOS, BILITOT, PROT, ALBUMIN in the last 168 hours. No results for input(s): LIPASE, AMYLASE in the last 168 hours. No results for input(s): AMMONIA in the last 168 hours. Coagulation Profile: Recent Labs  Lab 01/20/20 1622  INR 0.8   Cardiac Enzymes: No results for input(s): CKTOTAL, CKMB, CKMBINDEX, TROPONINI in the last 168 hours. BNP (last 3 results) No results for input(s): PROBNP in the last 8760 hours. HbA1C: No results for input(s): HGBA1C in the last 72 hours. CBG: No results for input(s): GLUCAP in the last 168 hours. Lipid Profile: No results for input(s): CHOL, HDL, LDLCALC, TRIG, CHOLHDL, LDLDIRECT in the last 72 hours. Thyroid Function Tests: No results for input(s): TSH, T4TOTAL, FREET4, T3FREE, THYROIDAB in the last 72 hours. Anemia Panel: No results for input(s): VITAMINB12, FOLATE, FERRITIN, TIBC, IRON, RETICCTPCT in the last 72 hours. Urine analysis: No results found for: COLORURINE, APPEARANCEUR, LABSPEC, PHURINE, GLUCOSEU, HGBUR, BILIRUBINUR, KETONESUR, PROTEINUR, UROBILINOGEN, NITRITE, LEUKOCYTESUR  Radiological Exams on Admission: 01/22/20 Venous Img Lower Unilateral Left  Result Date: 01/20/2020 CLINICAL DATA:  59 year old male with left lower extremity pain, history of DVT. EXAM: LEFT LOWER EXTREMITY VENOUS DOPPLER ULTRASOUND TECHNIQUE: Gray-scale sonography with graded compression, as well as color Doppler and duplex ultrasound were performed to evaluate the left lower extremity deep venous systems from the level of the common femoral vein and including the common femoral, femoral, profunda femoral, popliteal and calf veins including the posterior  tibial, peroneal and gastrocnemius veins when visible. Spectral Doppler was utilized to evaluate flow at rest and with distal augmentation maneuvers in the common femoral, femoral and popliteal veins. The contralateral common femoral vein was also evaluated for comparison. COMPARISON:  05/26/2010 FINDINGS: LEFT LOWER EXTREMITY Common Femoral Vein: No evidence of thrombus. Normal compressibility, respiratory phasicity and response to augmentation. Central Greater Saphenous Vein: No evidence  of thrombus. Normal compressibility and flow on color Doppler imaging. Central Profunda Femoral Vein: No evidence of thrombus. Normal compressibility and flow on color Doppler imaging. Femoral Vein: No evidence of thrombus. Normal compressibility, respiratory phasicity and response to augmentation. Popliteal Vein: No evidence of thrombus. Normal compressibility, respiratory phasicity and response to augmentation. Calf Veins: No evidence of thrombus. Normal compressibility and flow on color Doppler imaging. Other Findings: No significant arterial flow visualized throughout the left lower extremity. RIGHT LOWER EXTREMITY Common Femoral Vein: Persistent synechiae in the right common femoral vein. Partially thrombosed right common femoral artery. IMPRESSION: 1. No evidence of left lower extremity deep vein thrombosis. 2. No significant arterial flow visualized in the left lower extremity on this study. Consider arterial duplex study for further characterization. Marliss Coots, MD [facility] Electronically Signed   By: Marliss Coots MD   On: 01/20/2020 12:47   [place] Lower Ext Art Left  Result Date: 01/20/2020 CLINICAL DATA:  History of PAC with multiple bypasses an acute pain and cold extremity. EXAM: LEFT LOWER EXTREMITY ARTERIAL DUPLEX SCAN TECHNIQUE: Gray-scale sonography as well as color Doppler and duplex ultrasound was performed to evaluate the lower extremity arteries  including the common, superficial and profunda femoral arteries, popliteal artery and calf arteries. COMPARISON:  None. FINDINGS: Left lower Extremity ABI: Could not be obtained There is a left fem-fem bypass graft that is occluded, age indeterminate. There is no flow within the left common femoral artery. Flow is visualized within the left profundus femoris artery. The waveform is monophasic. No flow is visualized within the left superficial femoral artery. No flow is visualized in the left popliteal artery. The patient has an apparent left CFA to posterior tibial artery graft that is occluded. IMPRESSION: 1. Age-indeterminate complete occlusion of the patient's fem-fem bypass graft. 2. Age-indeterminate complete occlusion of what appears to represent a left CFA to posterior tibial artery bypass graft. 3. No flow is visualized within the native CFA, SFA , or popliteal arteries. No flow was visualized within the tibial arteries. 4. Grossly patent deep femoral artery with a monophasic waveform. 5. No ABI could be obtained on the left. Per report, the ordering provider has been notified of these results. Electronically Signed   By: Katherine Mantle M.D.   On: 01/20/2020 18:55    EKG: Independently reviewed.  Sinus rhythm LVH  Assessment/Plan Principal Problem:   Arterial occlusion, lower extremity (HCC) Active Problems:   Dehydration   Hypertension   Nicotine dependence   COPD (chronic obstructive pulmonary disease) with emphysema (HCC)   PAD (peripheral artery disease) (HCC)      Arterial occlusion left lower extremity Patient with a history of severe peripheral arterial disease status post recent fem-fem bypass involving the left lower extremity who presents for evaluation of pain, paresthesia and cold left leg Arterial Doppler shows age-indeterminate complete occlusion of the patient's fem-fem bypass graft.  Age-indeterminate complete occlusion of what appears to represent a left common  femoral artery to posterior tibial artery bypass graft. No flow is visualized within the native common femoral artery, superficial femoral artery or popliteal arteries.  No flow was visualized within the tibial arteries.  Grossly patent deep femoral artery with a monophasic waveform. Continue heparin drip Continue statins and beta-blockers Pain control Vascular surgery consult    Hypertension Continue carvedilol and lisinopril    Nicotine dependence Patient continues to smoke but states that he has cut down to 3 cigarettes daily Smoking cessation has been  discussed with him in detail He declines a nicotine transdermal patch at this time    COPD Stable and not acutely exacerbated Continue as needed bronchodilator therapy, inhaled steroids and Spiriva    Dehydration  As evidenced by increased BUN and creatinine above his baseline BUN is 30 above a baseline of 24 and serum creatinine is 1.5 above a baseline of 1.09 Gentle IV fluid hydration Repeat renal parameters in a.m.    DVT prophylaxis: Heparin Code Status: DO NOT RESUSCITATE Family Communication: Greater than 50% of time was spent discussing patient's condition and plan of care with him at the bedside.  All questions and concerns have been addressed.  He verbalizes understanding and agrees with the plan.  CODE STATUS was discussed and he is a DO NOT RESUSCITATE Disposition Plan: Back to previous home environment Consults called: Vascular surgery    Candace Begue MD [facility]     01/20/2020, 6:58 PM

## 2020-01-20 NOTE — Consult Note (Signed)
@LOGO @   MRN :  Christopher Bryan. is a 59 y.o. (24-Sep-1960) male who presents with chief complaint of  Chief Complaint  Patient presents with  . Leg Pain  . Chest Pain  . Shortness of Breath  .  History of Present Illness:  I have asked to evaluate the patient by Dr. 02/26/1960.  The patient is a 59 year old gentleman with an extensive history of vascular surgery including multiple bypass surgeries as well as multiple interventions in association with a significant contrast allergy.  He presents to [facility] with increasing pain of his left lower extremity.  He also notes that his foot feels cold but he can still wiggle his toes.  His most recent revascularization attempt was just 3 months ago all of his previous work was done at the [facility] and this was a femoral to femoral bypass.  He has never had any vascular interventions or surgeries at [facility].  No outpatient medications have been marked as taking for the 01/20/20 encounter [facility] Encounter).    Past Medical History:  Diagnosis Date  . DVT (deep venous thrombosis) (HCC)   . Hypertension     Past Surgical History:  Procedure Laterality Date  . KNEE ARTHROSCOPY Left   . ORIF FEMUR FRACTURE Right     Social History Social History   Tobacco Use  . Smoking status: Current Some Day Smoker  . Smokeless tobacco: Never Used  Substance Use Topics  . Alcohol use: Yes  . Drug use: No    Family History Family History  Problem Relation Age of Onset  . CAD Mother   . Emphysema Father     Allergies  Allergen Reactions  . Ivp Dye [Iodinated Diagnostic Agents] Hives     REVIEW OF SYSTEMS (Negative unless checked)  Constitutional: [] Weight loss  [] Fever  [] Chills Cardiac: [] Chest pain   [] Chest pressure   [] Palpitations   [] Shortness of breath when laying flat   [] Shortness of breath with exertion. Vascular:  [] Pain in legs with walking   [x] Pain in legs at rest  [] History of DVT    [] Phlebitis   [] Swelling in legs   [] Varicose veins   [] Non-healing ulcers Pulmonary:   [] Uses home oxygen   [] Productive cough   [] Hemoptysis   [] Wheeze  [] COPD   [] Asthma Neurologic:  [] Dizziness   [] Seizures   [] History of stroke   [] History of TIA  [] Aphasia   [] Vissual changes   [] Weakness or numbness in arm   [] Weakness or numbness in leg Musculoskeletal:   [] Joint swelling   [] Joint pain   [] Low back pain Hematologic:  [] Easy bruising  [] Easy bleeding   [] Hypercoagulable state   [] Anemic Gastrointestinal:  [] Diarrhea   [] Vomiting  [] Gastroesophageal reflux/heartburn   [] Difficulty swallowing. Genitourinary:  [] Chronic kidney disease   [] Difficult urination  [] Frequent urination   [] Blood in urine Skin:  [] Rashes   [] Ulcers  Psychological:  [] History of anxiety   []  History of major depression.  Physical Examination  Vitals:   01/20/20 1044 01/20/20 1048 01/20/20 1358 01/20/20 1621  BP:  (!) 194/82 (!) 159/82 (!) 155/88  Pulse:  85 70 75  Resp:  20 19 17   Temp:  98.7 F (37.1 C)    TempSrc:  Oral    SpO2:  100% 98% 98%  Weight: 57.3 kg     Height: 5\' 10"  (1.778 m)      Body mass index is 18.13 kg/m. Gen: WD/WN, NAD Head: [place]/AT, No temporalis  wasting.  Ear/Nose/Throat: Hearing grossly intact, nares w/o erythema or drainage Eyes: PER, EOMI, sclera nonicteric.  Neck: Supple, no large masses.   Pulmonary:  Good air movement, no audible wheezing bilaterally, no use of accessory muscles.  Cardiac: RRR, no JVD Vascular: Foot is cool to the touch there is still motor function he still has some sensation as well it is tender to palpation.  There are multiple scars from various previous bypass surgeries.  There is a femorofemoral bypass graft palpable suprapubic positioning. Vessel Right Left  Radial Palpable Palpable  Femoral Palpable  not palpable  Popliteal  not palpable  not palpable  PT  not palpable  not palpable  DP  not palpable  not palpable  Gastrointestinal:  Non-distended. No guarding/no peritoneal signs.  Musculoskeletal: M/S 5/5 throughout.  No deformity or atrophy.  Neurologic: CN 2-12 intact. Symmetrical.  Speech is fluent. Motor exam as listed above. Psychiatric: Judgment intact, Mood & affect appropriate for pt's clinical situation. Dermatologic: No rashes or ulcers noted.  No changes consistent with cellulitis.   CBC Lab Results  Component Value Date   WBC 4.3 01/20/2020   HGB 14.0 01/20/2020   HCT 43.8 01/20/2020   MCV 92.0 01/20/2020   PLT 200 01/20/2020    BMET    Component Value Date/Time   NA 139 01/20/2020 1051   K 4.8 01/20/2020 1051   CL 103 01/20/2020 1051   CO2 28 01/20/2020 1051   GLUCOSE 93 01/20/2020 1051   BUN 30 (H) 01/20/2020 1051   CREATININE 1.50 (H) 01/20/2020 1051   CALCIUM 9.4 01/20/2020 1051   GFRNONAA 53 (L) 01/20/2020 1051   GFRAA >60 01/11/2018 0356   Estimated Creatinine Clearance: 41.9 mL/min (A) (by C-G formula based on SCr of 1.5 mg/dL (H)).  COAG Lab Results  Component Value Date   INR 0.8 01/20/2020   INR 1.04 01/08/2018   INR 2.57 06/23/2016    Radiology US Venous Img Lower Unilateral Left  Result Date: 01/20/2020 CLINICAL DATA:  59 year old male with left lower extremity pain, history of DVT. EXAM: LEFT LOWER EXTREMITY VENOUS DOPPLER ULTRASOUND TECHNIQUE: Gray-scale sonography with graded compression, as well as color Doppler and duplex ultrasound were performed to evaluate the left lower extremity deep venous systems from the level of the common femoral vein and including the common femoral, femoral, profunda femoral, popliteal and calf veins including the posterior tibial, peroneal and gastrocnemius veins when visible. Spectral Doppler was utilized to evaluate flow at rest and with distal augmentation maneuvers in the common femoral, femoral and popliteal veins. The contralateral common femoral vein was also evaluated for comparison. COMPARISON:  05/26/2010 FINDINGS: LEFT LOWER  EXTREMITY Common Femoral Vein: No evidence of thrombus. Normal compressibility, respiratory phasicity and response to augmentation. Central Greater Saphenous Vein: No evidence of thrombus. Normal compressibility and flow on color Doppler imaging. Central Profunda Femoral Vein: No evidence of thrombus. Normal compressibility and flow on color Doppler imaging. Femoral Vein: No evidence of thrombus. Normal compressibility, respiratory phasicity and response to augmentation. Popliteal Vein: No evidence of thrombus. Normal compressibility, respiratory phasicity and response to augmentation. Calf Veins: No evidence of thrombus. Normal compressibility and flow on color Doppler imaging. Other Findings: No significant arterial flow visualized throughout the left lower extremity. RIGHT LOWER EXTREMITY Common Femoral Vein: Persistent synechiae in the right common femoral vein. Partially thrombosed right common femoral artery. IMPRESSION: 1. No evidence of left lower extremity deep vein thrombosis. 2. No significant arterial flow visualized in the left lower extremity  on this study. Consider arterial duplex study for further characterization. Marliss Coots, MD [facility] Electronically Signed   By: Marliss Coots MD   On: 01/20/2020 12:47     Assessment/Plan 1.  Atherosclerotic occlusive disease bilateral lower extremities with rest pain of the left lower extremity: Recommend:  The patient has evidence of severe atherosclerotic changes of both lower extremities with rest pain of the left lower extremity that is associated with preulcerative changes and impending tissue loss of the foot.  This represents a limb threatening ischemia and places the patient at the risk for left limb loss.  Patient should undergo angiography of the left lower extremity with the hope for intervention for limb salvage.  The risks and benefits as well as the alternative therapies was  discussed in detail with the patient.  All questions were answered.  Patient agrees to proceed with left angiography.  There are several complicating factors including his dye allergy as well as the numerous previous vascular interventions and surgeries.  Given these confounding factors I have elected to place him on heparin begin his diet prophylaxis and will move forward with angiography and intervention as the first case tomorrow this will allow adequate time to complete prednisone prophylaxis.  2.  Contrast allergy significant: Patient will be premedicated with 50 mg of prednisone every 6 hours as well as Benadryl.  Additional steroids will be given at the time of his angiography.  3.  COPD: Continue pulmonary medications and aerosols as already ordered, these medications have been reviewed and there are no changes at this time.  4.  Hypertension:Continue antihypertensive medications as already ordered, these medications have been reviewed and there are no changes at this time.          Levora Dredge, MD  01/20/2020 6:03 PM

## 2020-01-21 ENCOUNTER — Encounter: Payer: Self-pay | Admitting: Vascular Surgery

## 2020-01-21 ENCOUNTER — Encounter
Admission: EM | Disposition: A | Discharge status: Home / Self Care | Payer: Self-pay | Source: Home / Self Care | Attending: Internal Medicine

## 2020-01-21 DIAGNOSIS — I70201 Unspecified atherosclerosis of native arteries of extremities, right leg: Secondary | ICD-10-CM

## 2020-01-21 DIAGNOSIS — I70222 Atherosclerosis of native arteries of extremities with rest pain, left leg: Secondary | ICD-10-CM

## 2020-01-21 DIAGNOSIS — T82868A Thrombosis of vascular prosthetic devices, implants and grafts, initial encounter: Secondary | ICD-10-CM

## 2020-01-21 HISTORY — PX: LOWER EXTREMITY ANGIOGRAPHY: CATH118251

## 2020-01-21 LAB — BASIC METABOLIC PANEL
Anion gap: 6 (ref 5–15)
BUN: 25 mg/dL — ABNORMAL HIGH (ref 8–23)
CO2: 26 mmol/L (ref 22–32)
Calcium: 9.2 mg/dL (ref 8.9–10.3)
Chloride: 105 mmol/L (ref 98–111)
Creatinine, Ser: 1.06 mg/dL (ref 0.61–1.24)
GFR, Estimated: >60 mL/min (ref >60)
Glucose, Bld: 139 mg/dL — ABNORMAL HIGH (ref 70–99)
Potassium: 4.7 mmol/L (ref 3.5–5.1)
Sodium: 137 mmol/L (ref 135–145)

## 2020-01-21 LAB — CBC
HCT: 38.4 % — ABNORMAL LOW (ref 39.0–52.0)
Hemoglobin: 12.3 g/dL — ABNORMAL LOW (ref 13.0–17.0)
MCH: 29.4 pg (ref 26.0–34.0)
MCHC: 32 g/dL (ref 30.0–36.0)
MCV: 91.9 fL (ref 80.0–100.0)
Platelets: 186 10*3/uL (ref 150–400)
RBC: 4.18 MIL/uL — ABNORMAL LOW (ref 4.22–5.81)
RDW: 12.4 % (ref 11.5–15.5)
WBC: 3.9 10*3/uL — ABNORMAL LOW (ref 4.0–10.5)
nRBC: 0 % (ref 0.0–0.2)

## 2020-01-21 LAB — HEPARIN LEVEL (UNFRACTIONATED)
Heparin Unfractionated: 0.48 IU/mL (ref 0.30–0.70)
Heparin Unfractionated: 0.45 IU/mL (ref 0.30–0.70)

## 2020-01-21 LAB — HIV ANTIBODY (ROUTINE TESTING W REFLEX): HIV Screen 4th Generation wRfx: NONREACTIVE

## 2020-01-21 SURGERY — LOWER EXTREMITY ANGIOGRAPHY
Anesthesia: Moderate Sedation | Laterality: Left

## 2020-01-21 MED ORDER — METHYLPREDNISOLONE SODIUM SUCC 125 MG IJ SOLR
INTRAMUSCULAR | Status: AC
  Administered 2020-01-21: 09:00:00 125 mg via INTRAVENOUS
  Filled 2020-01-21: qty 2

## 2020-01-21 MED ORDER — SODIUM CHLORIDE 0.9% FLUSH
3.0000 mL | Freq: Two times a day (BID) | INTRAVENOUS | Status: DC
  Administered 2020-01-21 – 2020-01-24 (×6): 3 mL via INTRAVENOUS

## 2020-01-21 MED ORDER — ATORVASTATIN CALCIUM 10 MG PO TABS
10.0000 mg | ORAL_TABLET | Freq: Every day | ORAL | Status: DC
  Administered 2020-01-21 – 2020-01-23 (×3): 10 mg via ORAL
  Filled 2020-01-21 (×3): qty 1

## 2020-01-21 MED ORDER — ACETAMINOPHEN 325 MG PO TABS: 650.0000 mg | ORAL_TABLET | ORAL | Status: DC | PRN

## 2020-01-21 MED ORDER — DIPHENHYDRAMINE HCL 50 MG/ML IJ SOLN
50.0000 mg | Freq: Once | INTRAMUSCULAR | Status: AC
  Administered 2020-01-21: 08:00:00 50 mg via INTRAVENOUS

## 2020-01-21 MED ORDER — TIROFIBAN HCL IV 12.5 MG/250 ML: 0.0750 ug/kg/min | INTRAVENOUS | Status: AC

## 2020-01-21 MED ORDER — HEPARIN SODIUM (PORCINE) 1000 UNIT/ML IJ SOLN
INTRAMUSCULAR | Status: DC | PRN
  Administered 2020-01-21: 4000 [IU] via INTRAVENOUS

## 2020-01-21 MED ORDER — SODIUM CHLORIDE 0.9% FLUSH: 3.0000 mL | INTRAVENOUS | Status: DC | PRN

## 2020-01-21 MED ORDER — DIPHENHYDRAMINE HCL 50 MG/ML IJ SOLN
INTRAMUSCULAR | Status: AC
  Filled 2020-01-21: qty 1

## 2020-01-21 MED ORDER — HYDRALAZINE HCL 20 MG/ML IJ SOLN
INTRAMUSCULAR | Status: AC
  Filled 2020-01-21: qty 1

## 2020-01-21 MED ORDER — MORPHINE SULFATE (PF) 4 MG/ML IV SOLN
2.0000 mg | INTRAVENOUS | Status: DC | PRN
  Administered 2020-01-21 – 2020-01-23 (×5): 2 mg via INTRAVENOUS
  Filled 2020-01-21 (×5): qty 1

## 2020-01-21 MED ORDER — ONDANSETRON HCL 4 MG/2ML IJ SOLN
4.0000 mg | Freq: Four times a day (QID) | INTRAMUSCULAR | Status: DC | PRN
  Administered 2020-01-23 – 2020-01-24 (×2): 4 mg via INTRAVENOUS
  Filled 2020-01-21 (×2): qty 2

## 2020-01-21 MED ORDER — FENTANYL CITRATE (PF) 100 MCG/2ML IJ SOLN
INTRAMUSCULAR | Status: DC | PRN
  Administered 2020-01-21: 25 ug via INTRAVENOUS

## 2020-01-21 MED ORDER — HYDRALAZINE HCL 20 MG/ML IJ SOLN: 5.0000 mg | INTRAMUSCULAR | Status: DC | PRN

## 2020-01-21 MED ORDER — FENTANYL CITRATE (PF) 100 MCG/2ML IJ SOLN
INTRAMUSCULAR | Status: DC | PRN
  Administered 2020-01-21: 25 ug via INTRAVENOUS
  Administered 2020-01-21: 50 ug via INTRAVENOUS

## 2020-01-21 MED ORDER — SODIUM CHLORIDE 0.9 % IV SOLN: INTRAVENOUS | Status: AC

## 2020-01-21 MED ORDER — TIROFIBAN (AGGRASTAT) BOLUS VIA INFUSION
25.0000 ug/kg | Freq: Once | INTRAVENOUS | Status: AC
  Filled 2020-01-21: qty 29

## 2020-01-21 MED ORDER — FENTANYL CITRATE (PF) 100 MCG/2ML IJ SOLN
INTRAMUSCULAR | Status: AC
  Filled 2020-01-21: qty 2

## 2020-01-21 MED ORDER — CLOPIDOGREL BISULFATE 75 MG PO TABS
75.0000 mg | ORAL_TABLET | Freq: Every day | ORAL | Status: DC
  Administered 2020-01-22 – 2020-01-24 (×3): 75 mg via ORAL
  Filled 2020-01-21 (×3): qty 1

## 2020-01-21 MED ORDER — HEPARIN SODIUM (PORCINE) 1000 UNIT/ML IJ SOLN
INTRAMUSCULAR | Status: AC
  Filled 2020-01-21: qty 1

## 2020-01-21 MED ORDER — MIDAZOLAM HCL 5 MG/5ML IJ SOLN
INTRAMUSCULAR | Status: AC
  Filled 2020-01-21: qty 5

## 2020-01-21 MED ORDER — SODIUM CHLORIDE 0.9 % IV SOLN: 250.0000 mL | INTRAVENOUS | Status: DC | PRN

## 2020-01-21 MED ORDER — CEFAZOLIN SODIUM-DEXTROSE 2-4 GM/100ML-% IV SOLN
INTRAVENOUS | Status: AC
  Filled 2020-01-21: qty 100

## 2020-01-21 MED ORDER — MIDAZOLAM HCL 2 MG/2ML IJ SOLN
INTRAMUSCULAR | Status: DC | PRN
  Administered 2020-01-21: 2 mg via INTRAVENOUS

## 2020-01-21 MED ORDER — LABETALOL HCL 5 MG/ML IV SOLN
INTRAVENOUS | Status: AC
  Filled 2020-01-21: qty 4

## 2020-01-21 MED ORDER — LABETALOL HCL 5 MG/ML IV SOLN: 10.0000 mg | INTRAVENOUS | Status: DC | PRN

## 2020-01-21 MED ORDER — HYDROCORTISONE NA SUCCINATE PF 100 MG IJ SOLR
100.0000 mg | Freq: Once | INTRAMUSCULAR | Status: DC
  Filled 2020-01-21: qty 2

## 2020-01-21 MED ORDER — LABETALOL HCL 5 MG/ML IV SOLN
INTRAVENOUS | Status: DC | PRN
  Administered 2020-01-21: 10 mg via INTRAVENOUS

## 2020-01-21 MED ORDER — OXYCODONE HCL 5 MG PO TABS
5.0000 mg | ORAL_TABLET | ORAL | Status: DC | PRN
Start: 2020-01-21 — End: 2020-01-24
  Administered 2020-01-21: 5 mg via ORAL
  Administered 2020-01-21 – 2020-01-22 (×2): 10 mg via ORAL
  Administered 2020-01-23: 5 mg via ORAL
  Administered 2020-01-24: 10 mg via ORAL
  Filled 2020-01-21: qty 2
  Filled 2020-01-21 (×2): qty 1
  Filled 2020-01-21 (×3): qty 2

## 2020-01-21 MED ORDER — TIROFIBAN HCL IV 12.5 MG/250 ML
INTRAVENOUS | Status: AC
  Administered 2020-01-21: 11:00:00 1432.5 ug via INTRAVENOUS
  Filled 2020-01-21: qty 250

## 2020-01-21 MED ORDER — MIDAZOLAM HCL 2 MG/2ML IJ SOLN
INTRAMUSCULAR | Status: DC | PRN
  Administered 2020-01-21: 1 mg via INTRAVENOUS
  Administered 2020-01-21: 0.5 mg via INTRAVENOUS

## 2020-01-21 MED ORDER — ALTEPLASE 2 MG IJ SOLR
INTRAMUSCULAR | Status: AC
  Filled 2020-01-21: qty 10

## 2020-01-21 MED ORDER — IODIXANOL 320 MG/ML IV SOLN
INTRAVENOUS | Status: DC | PRN
  Administered 2020-01-21: 10:00:00 85 mL via INTRA_ARTERIAL

## 2020-01-21 SURGICAL SUPPLY — 28 items
BALLN LUTONIX DCB 7X40X130 (BALLOONS) ×6
BALLN LUTONIX DCB 7X60X130 (BALLOONS) ×9
BALLOON LUTONIX DCB 7X40X130 (BALLOONS) ×2 IMPLANT
BALLOON LUTONIX DCB 7X60X130 (BALLOONS) ×3 IMPLANT
CANISTER PENUMBRA ENGINE (MISCELLANEOUS) ×3 IMPLANT
CANNULA 5F STIFF (CANNULA) ×3 IMPLANT
CATH ANGIO 5F PIGTAIL 65CM (CATHETERS) ×3 IMPLANT
CATH BEACON 5 .035 40 KMP TP (CATHETERS) ×1 IMPLANT
CATH BEACON 5 .038 40 KMP TP (CATHETERS) ×3
CATH INDIGO CAT6 KIT (CATHETERS) ×3 IMPLANT
DEVICE SAFEGUARD 24CM (GAUZE/BANDAGES/DRESSINGS) ×3 IMPLANT
DEVICE STARCLOSE SE CLOSURE (Vascular Products) ×3 IMPLANT
DEVICE TORQUE .025-.038 (MISCELLANEOUS) ×3 IMPLANT
GLIDEWIRE ADV .035X260CM (WIRE) ×3 IMPLANT
GLIDEWIRE STIFF .35X180X3 HYDR (WIRE) ×3 IMPLANT
KIT ENCORE 26 ADVANTAGE (KITS) ×3 IMPLANT
NEEDLE ENTRY 21GA 7CM ECHOTIP (NEEDLE) ×3 IMPLANT
PACK ANGIOGRAPHY (CUSTOM PROCEDURE TRAY) ×3 IMPLANT
SET INTRO CAPELLA COAXIAL (SET/KITS/TRAYS/PACK) ×3 IMPLANT
SHEATH BRITE TIP 5FRX11 (SHEATH) ×3 IMPLANT
SHEATH BRITE TIP 6FRX11 (SHEATH) ×6 IMPLANT
STENT LIFESTAR 9X40 (Permanent Stent) ×6 IMPLANT
STENT LIFESTENT 5F 7X40X135 (Permanent Stent) ×3 IMPLANT
TUBING CONTRAST HIGH PRESS 72 (TUBING) ×3 IMPLANT
WIRE GUIDERIGHT .035X150 (WIRE) ×3 IMPLANT
WIRE MAGIC TORQUE 315CM (WIRE) ×3 IMPLANT
WIRE RUNTHROUGH .014X300CM (WIRE) ×3 IMPLANT
WIRE SPARTACORE .014X190CM (WIRE) ×3 IMPLANT

## 2020-01-21 NOTE — Op Note (Signed)
[facility]  Percutaneous Study/Intervention Procedural Note   Date of Surgery: 01/21/2020,10:13 AM  Surgeon:Keren Alverio, Dolores Lory   Pre-operative Diagnosis: Ischemic left lower extremity; atherosclerotic occlusive disease bilateral lower extremities with rest pain of the left lower extremity; complication vascular device with thrombosis of femorofemoral bypass  Post-operative diagnosis:  Same  Procedure(s) Performed:  1.  Abdominal aortogram  2.  Left lower extremity angiography third order catheter placement  3.  Percutaneous transluminal angioplasty and stent placement right external iliac artery  4.  Percutaneous transluminal angioplasty right common femoral artery  5.  Mechanical thrombectomy of the femoral-femoral bypass  6.  Percutaneous transluminal angioplasty and stent placement left common femoral artery and bypass anastomosis  7.  Star close right superficial femoral artery  Anesthesia: Conscious sedation was administered by the interventional radiology RN under my direct supervision. IV Versed plus fentanyl were utilized. Continuous ECG, pulse oximetry and blood pressure was monitored throughout the entire procedure.  Conscious sedation was administered for a total of 110 minutes.  Sheath: 6 French 11 cm Pinnacle sheath right superficial femoral artery retrograde  Contrast: 85 cc   Fluoroscopy Time: 10.1 minutes  Indications:  The patient presents to [facility] with ischemic changes to the left foot and heel.  Pedal pulses are nonpalpable bilaterally suggesting atherosclerotic occlusive disease, also the pulse in the left groin overlying the femoral-femoral bypass graft is nonpalpable.  The risks and benefits as well as alternative therapies for lower extremity revascularization are reviewed with the patient all questions are answered the patient agrees to proceed.  The patient is therefore undergoing angiography with the hope for intervention  for limb salvage.   Procedure:  MATTHEWJAMES PETRASEK Jr.is a 59 y.o. male who was identified and appropriate procedural time out was performed.  The patient was then placed supine on the table and prepped and draped in the usual sterile fashion.  Ultrasound was used to evaluate the right superficial femoral artery.  This was performed purposefully to allow room to access both the aorta as well as the femoral-femoral bypass through this 1 position.  The superficial femoral artery was echolucent and pulsatile indicating it is patent .  An ultrasound image was acquired for the permanent record.  A micropuncture needle was used to access the right superficial femoral artery under direct ultrasound guidance.  The microwire was then advanced under fluoroscopic guidance without difficulty followed by the micro-sheath.  A 0.035 J wire was advanced without resistance and a 5Fr sheath was placed.    Pigtail catheter was then advanced to the level of T12 and AP projection of the aorta was obtained. Pigtail catheter was then repositioned to above the bifurcation and LAO view of the pelvis was obtained.  This view demonstrated greater than 80% stenosis in the proximal and greater than 70% stenosis in the midportion of the right external iliac artery.  The common iliac artery is diffusely diseased but it does not appear to have a hemodynamically significant stenosis.  Given that the entire inflow from both legs is dependent on the right iliac system I elected to treat this immediately.  4000 units of heparin was given.  A 9 x 40 life star stent was deployed across the proximal lesion and then a 9 x 40 life star stent was deployed across the mid lesion.  The 2 lesions were postdilated with 7 mm Lutonix drug-eluting balloons each inflation was to 12 atm for approximately 1 minute.  Follow-up imaging by hand-injection through  the sheath demonstrated less than 5% residual stenosis.  Of note greater than 70% stenosis within the  right common femoral artery was also noted at this time.  The detector was then positioned to the RAO and hand-injection of contrast was performed which demonstrated a small stump of the femorofemoral on the right but otherwise the femoral-femoral bypass was occluded.  Using a Glidewire and a Kumpe catheter I was able to negotiate the femoral-femoral bypass and advanced the catheter into the profunda femoris on the left.  Hand-injection contrast confirmed patency of the profunda femoris.  There is nonvisualization of the superficial femoral artery in its entirety there is nonvisualization of any previous bypass grafts in their entirety there are no residual cul-de-sacs to indicate the origins.  10 mg of TPA was then infused throughout the length of the femoral-femoral bypass and allowed to dwell for 30 minutes.  The penumbra CAT 6 device was then used to perform mechanical thrombectomy of the femoral-femoral bypass.  A total of 4 passes were made.  Follow-up imaging demonstrated moderate residual at the right common femoral anastomosis and greater than 90% stenosis at the left common femoral anastomosis extending into the common femoral artery on the left.  A 7 mm x 40 mm Lutonix drug-eluting balloon was then used to treat the left anastomosis first and then pulled back and treated the right anastomosis.  Flexions were to 10 atm for 1 minute each.  Follow-up imaging demonstrated complete resolution with less than 5% residual stenosis on the right however the left anastomosis demonstrated greater than 50% residual stenosis and therefore a 8 mm x 40 mm life stent was deployed into the common femoral across the anastomosis of the bypass graft and postdilated with a 7 mm Lutonix drug-eluting balloon inflated to 10 atm.  Follow-up imaging on the left now demonstrated less than 5% residual stenosis with preservation of the profunda femoris outflow.  Attention was then turned to the right common femoral the wire  was pulled back out of the femoral-femoral bypass graft advanced into the aorta and a 7 mm x 60 mm Lutonix drug-eluting balloon was advanced across the right common femoral inflated to 6 atm for 1 minute.  Follow-up imaging demonstrated less than 10% residual stenosis.  There is now excellent inflow with wide patency of the femorofemoral bypass and filling of the left profunda femoris.  After review of the images the catheter was removed over wire and an RAO view of the groin was obtained. StarClose device was deployed without difficulty.   Findings:   Aortogram: The abdominal aorta is diffusely diseased but widely patent without hemodynamically significant stenosis.  LAO view of the pelvis is then obtained.  This view demonstrated greater than 80% stenosis in the proximal and greater than 70% stenosis in the midportion of the right external iliac artery.  The common iliac artery is diffusely diseased but it does not appear to have a hemodynamically significant stenosis.  Also from these images it is clear that the femoral to femoral bypass graft is occluded.  Right Lower Extremity: The right common femoral demonstrates a 70% stenosis in its midportion which is affecting inflow to both the right leg as well as the bypass graft the profunda femoris demonstrates a 60% stenosis at its origin but is otherwise widely patent the visualized portions of the SFA are widely patent  Left Lower Extremity: The common femoral demonstrates a 60% stenosis.  As noted above there is nonvisualization of the SFA and previous bypasses.  There is not even any cul-de-sac that would suggest where these origins are located.  The profunda femoris is widely patent.  Following angioplasty and stent placement in the right external iliac artery there is less than 5% residual stenosis.  Following angioplasty of the right common femoral there is less than 5% residual stenosis.  Following mechanical thrombectomy of the femoral to femoral  bypass there is now patency.  Following angioplasty and stent placement as described above there is now wide patency of the left femoral anastomosis with brisk filling of the profunda femoris.   Disposition: Patient was taken to the recovery room in stable condition having tolerated the procedure well.  Belenda Cruise Chanley Mcenery 01/21/2020,10:13 AM

## 2020-01-21 NOTE — Interval H&P Note (Signed)
History and Physical Interval Note:  01/21/2020 8:20 AM  Christopher Bryan.  has presented today for surgery, with the diagnosis of ASO with rest pain.  The various methods of treatment have been discussed with the patient and family. After consideration of risks, benefits and other options for treatment, the patient has consented to  Procedure(s): Lower Extremity Angiography (Left) as a surgical intervention.  The patient's history has been reviewed, patient examined, no change in status, stable for surgery.  I have reviewed the patient's chart and labs.  Questions were answered to the patient's satisfaction.     Levora Dredge

## 2020-01-21 NOTE — Progress Notes (Signed)
PROGRESS NOTE    Christopher Bryan.  TKZ:[id number] DOB: November 20, 1960 DOA: 01/20/2020 PCP: [facility]   Chief Complaint  Patient presents with  . Leg Pain  . Chest Pain  . Shortness of Breath   Brief Narrative: 59 year old male with significant history of severe peripheral arterial disease status post multiple surgeries and recent fem-fem bypass in August 2021, hypertension, nicotine dependence, chronic diastolic CHF, COPD presented to the ED 12/20 with worsening pain in his left leg, onset 5 days prior to admission, unable to ambulate.  He has been on Lovenox since his recent surgery but missed 1 week of his recommended therapy and recently started back on Lovenox treatment. Patient was seen in the ED found to have acute left lower extremity arterial occlusion, vascular surgery was consulted, started on heparin drip and planned for OR 12/21  Subjective:  Status post OR today. Renal function is stable hemoglobin 12.3 g. Having meal, C.o pain on left let at groin area an on toes Assessment & Plan:  PAD with ischemic left lower extremity with rest pain, admitted on heparin drip - underwent ORIF with abdominal aortogram, left extremity angio graphically third order catheter placement, PCTA and stent placement of right external iliac artery, PCTA right common femoral artery, mechanical thrombectomy of the femoral-femoral bypass, PCTA and stent placement of left common femoral artery and bypass anastomosis, [place] right superficial femoral artery by Dr Gilda Crease.  Patient is now on Aggrastat infusion, Plavix and Lipitor.  Cont plan as per vascular.  Hypertension: Blood pressure overall is stable.  Hopefully can resume Coreg and lisinopril soon.  Nicotine dependence: He continues to smoke but has been cutting down, cessation advised strongly.  Declined nicotine patch.  COPD with emphysema; not in exacerbation, stable continue supplemental oxygen, inhaled steroids,Spiriva and  bronchodilators.  Dehydration with increased BUN and creatinine on admission, he was given IV fluids.  Nutrition: Diet Order            Diet Heart Room service appropriate? Yes; Fluid consistency: Thin  Diet effective now                 Body mass index is 18.13 kg/m.  DVT prophylaxis:  Code Status:   Code Status: DNR agrastat gtt Family Communication: plan of care discussed with patient at bedside.  Status is: Inpatient Remains inpatient appropriate because:IV treatments appropriate due to intensity of illness or inability to take PO and Inpatient level of care appropriate due to severity of illness  Dispo: The patient is from: Home              Anticipated d/c is to: Home              Anticipated d/c date is: 2 days              Patient currently is not medically stable to d/c. Consultants:see note  Procedures:see note  Culture/Microbiology No results found for: SDES, SPECREQUEST, CULT, REPTSTATUS  Other culture-see note  Medications: Scheduled Meds: . atorvastatin  10 mg Oral q1800  . [START ON 01/22/2020] clopidogrel  75 mg Oral Q breakfast  . diphenhydrAMINE      . fentaNYL      . heparin sodium (porcine)      . hydrALAZINE      . labetalol      . midazolam      . sodium chloride flush  3 mL Intravenous Q12H   Continuous Infusions: . sodium chloride    .  sodium chloride    . ceFAZolin    . tirofiban 0.075 mcg/kg/min (01/21/20 1043)    Antimicrobials: Anti-infectives (From admission, onward)   Start     Dose/Rate Route Frequency Ordered Stop   01/21/20 0843  ceFAZolin (ANCEF) 2-4 GM/100ML-% IVPB       Note to Pharmacy: Rita Ohara   : cabinet override      01/21/20 0843 01/21/20 2059   01/21/20 0600  ceFAZolin (ANCEF) IVPB 2g/100 mL premix       Note to Pharmacy: Give on call to special  8 am tomorrow   2 g 200 mL/hr over 30 Minutes Intravenous On call to O.R. 01/20/20 1800 01/21/20 0830     Objective: Vitals: Today's Vitals   01/21/20 1130  01/21/20 1200 01/21/20 1230 01/21/20 1307  BP: 140/66 (!) 158/75 (!) 141/66 (!) 150/64  Pulse:   88 91  Resp: 10 18 (!) 21 17  Temp:    98.5 F (36.9 C)  TempSrc:    Oral  SpO2:   96% 100%  Weight:      Height:      PainSc: Asleep 0-No pain 3      Intake/Output Summary (Last 24 hours) at 01/21/2020 1405 Last data filed at 01/21/2020 1100 Gross per 24 hour  Intake 1000 ml  Output 600 ml  Net 400 ml   Filed Weights   01/20/20 1044  Weight: 57.3 kg   Weight change:   Intake/Output from previous day: 12/20 0701 - 12/21 0700 In: 1000 [IV Piggyback:1000] Out: -  Intake/Output this shift: Total I/O In: -  Out: 600 [Urine:600]  Examination: General exam: AAOx3 ,NAD, weak appearing. HEENT:Oral mucosa moist, Ear/Nose WNL grossly,dentition normal. Respiratory system: bilaterally clear,no wheezing or crackles,no use of accessory muscle, non tender. Cardiovascular system: S1 & S2 +, regular, No JVD. Gastrointestinal system: Abdomen soft, NT,ND, BS+. Nervous System:Alert, awake, moving extremities and grossly nonfocal Extremities: No edema, distal peripheral perfused, warm, Skin: No rashes,no icterus. MSK: Normal muscle bulk,tone, power  Data Reviewed: I have personally reviewed following labs and imaging studies CBC: Recent Labs  Lab 01/20/20 1051 01/21/20 0614  WBC 4.3 3.9*  HGB 14.0 12.3*  HCT 43.8 38.4*  MCV 92.0 91.9  PLT 200 186   Basic Metabolic Panel: Recent Labs  Lab 01/20/20 1051 01/21/20 0614  NA 139 137  K 4.8 4.7  CL 103 105  CO2 28 26  GLUCOSE 93 139*  BUN 30* 25*  CREATININE 1.50* 1.06  CALCIUM 9.4 9.2   GFR: Estimated Creatinine Clearance: 59.3 mL/min (by C-G formula based on SCr of 1.06 mg/dL). Liver Function Tests: No results for input(s): AST, ALT, ALKPHOS, BILITOT, PROT, ALBUMIN in the last 168 hours. No results for input(s): LIPASE, AMYLASE in the last 168 hours. No results for input(s): AMMONIA in the last 168 hours. Coagulation  Profile: Recent Labs  Lab 01/20/20 1622  INR 0.8   Cardiac Enzymes: No results for input(s): CKTOTAL, CKMB, CKMBINDEX, TROPONINI in the last 168 hours. BNP (last 3 results) No results for input(s): PROBNP in the last 8760 hours. HbA1C: No results for input(s): HGBA1C in the last 72 hours. CBG: No results for input(s): GLUCAP in the last 168 hours. Lipid Profile: No results for input(s): CHOL, HDL, LDLCALC, TRIG, CHOLHDL, LDLDIRECT in the last 72 hours. Thyroid Function Tests: No results for input(s): TSH, T4TOTAL, FREET4, T3FREE, THYROIDAB in the last 72 hours. Anemia Panel: No results for input(s): VITAMINB12, FOLATE, FERRITIN, TIBC, IRON, RETICCTPCT in the  last 72 hours. Sepsis Labs: No results for input(s): PROCALCITON, LATICACIDVEN in the last 168 hours.  Recent Results (from the past 240 hour(s))  Resp Panel by RT-PCR (Flu A&B, Covid) Nasopharyngeal Swab     Status: None   Collection Time: 01/20/20  5:37 PM   Specimen: Nasopharyngeal Swab; Nasopharyngeal(NP) swabs in vial transport medium  Result Value Ref Range Status   SARS Coronavirus 2 by RT PCR NEGATIVE NEGATIVE Final    Comment: (NOTE) SARS-CoV-2 target nucleic acids are NOT DETECTED.  The SARS-CoV-2 RNA is generally detectable in upper respiratory specimens during the acute phase of infection. The lowest concentration of SARS-CoV-2 viral copies this assay can detect is 138 copies/mL. A negative result does not preclude SARS-Cov-2 infection and should not be used as the sole basis for treatment or other patient management decisions. A negative result may occur with  improper specimen collection/handling, submission of specimen other than nasopharyngeal swab, presence of viral mutation(s) within the areas targeted by this assay, and inadequate number of viral copies(<138 copies/mL). A negative result must be combined with clinical observations, patient history, and epidemiological information. The expected result  is Negative.  Fact Sheet for Patients:  BloggerCourse.com  Fact Sheet for Healthcare Providers:  SeriousBroker.it  This test is no t yet approved or cleared by the [place] FDA and  has been authorized for detection and/or diagnosis of SARS-CoV-2 by FDA under an Emergency Use Authorization (EUA). This EUA will remain  in effect (meaning this test can be used) for the duration of the COVID-19 declaration under Section 564(b)(1) of the Act, 21 U.S.C.section 360bbb-3(b)(1), unless the authorization is terminated  or revoked sooner.       Influenza A by PCR NEGATIVE NEGATIVE Final   Influenza B by PCR NEGATIVE NEGATIVE Final    Comment: (NOTE) The Xpert Xpress SARS-CoV-2/FLU/RSV plus assay is intended as an aid in the diagnosis of influenza from Nasopharyngeal swab specimens and should not be used as a sole basis for treatment. Nasal washings and aspirates are unacceptable for Xpert Xpress SARS-CoV-2/FLU/RSV testing.  Fact Sheet for Patients: BloggerCourse.com  Fact Sheet for Healthcare Providers: SeriousBroker.it  This test is not yet approved or cleared by the [place] FDA and has been authorized for detection and/or diagnosis of SARS-CoV-2 by FDA under an Emergency Use Authorization (EUA). This EUA will remain in effect (meaning this test can be used) for the duration of the COVID-19 declaration under Section 564(b)(1) of the Act, 21 U.S.C. section 360bbb-3(b)(1), unless the authorization is terminated or revoked.  Performed at [facility], [address], [place], [place] [address]      Radiology Studies: PERIPHERAL VASCULAR CATHETERIZATION  Result Date: 01/21/2020 See op note  US Venous Img Lower Unilateral Left  Result Date: 01/20/2020 CLINICAL DATA:  59 year old male with left lower extremity pain, history of DVT. EXAM: LEFT LOWER EXTREMITY  VENOUS DOPPLER ULTRASOUND TECHNIQUE: Gray-scale sonography with graded compression, as well as color Doppler and duplex ultrasound were performed to evaluate the left lower extremity deep venous systems from the level of the common femoral vein and including the common femoral, femoral, profunda femoral, popliteal and calf veins including the posterior tibial, peroneal and gastrocnemius veins when visible. Spectral Doppler was utilized to evaluate flow at rest and with distal augmentation maneuvers in the common femoral, femoral and popliteal veins. The contralateral common femoral vein was also evaluated for comparison. COMPARISON:  05/26/2010 FINDINGS: LEFT LOWER EXTREMITY Common Femoral Vein: No evidence of thrombus. Normal compressibility,  respiratory phasicity and response to augmentation. Central Greater Saphenous Vein: No evidence of thrombus. Normal compressibility and flow on color Doppler imaging. Central Profunda Femoral Vein: No evidence of thrombus. Normal compressibility and flow on color Doppler imaging. Femoral Vein: No evidence of thrombus. Normal compressibility, respiratory phasicity and response to augmentation. Popliteal Vein: No evidence of thrombus. Normal compressibility, respiratory phasicity and response to augmentation. Calf Veins: No evidence of thrombus. Normal compressibility and flow on color Doppler imaging. Other Findings: No significant arterial flow visualized throughout the left lower extremity. RIGHT LOWER EXTREMITY Common Femoral Vein: Persistent synechiae in the right common femoral vein. Partially thrombosed right common femoral artery. IMPRESSION: 1. No evidence of left lower extremity deep vein thrombosis. 2. No significant arterial flow visualized in the left lower extremity on this study. Consider arterial duplex study for further characterization. Marliss Cootsylan Suttle, MD [facility] Electronically Signed   By: Marliss Cootsylan   Suttle MD   On: 01/20/2020 12:47   US Lower Ext Art Left  Result Date: 01/20/2020 CLINICAL DATA:  History of PAC with multiple bypasses an acute pain and cold extremity. EXAM: LEFT LOWER EXTREMITY ARTERIAL DUPLEX SCAN TECHNIQUE: Gray-scale sonography as well as color Doppler and duplex ultrasound was performed to evaluate the lower extremity arteries including the common, superficial and profunda femoral arteries, popliteal artery and calf arteries. COMPARISON:  None. FINDINGS: Left lower Extremity ABI: Could not be obtained There is a left fem-fem bypass graft that is occluded, age indeterminate. There is no flow within the left common femoral artery. Flow is visualized within the left profundus femoris artery. The waveform is monophasic. No flow is visualized within the left superficial femoral artery. No flow is visualized in the left popliteal artery. The patient has an apparent left CFA to posterior tibial artery graft that is occluded. IMPRESSION: 1. Age-indeterminate complete occlusion of the patient's fem-fem bypass graft. 2. Age-indeterminate complete occlusion of what appears to represent a left CFA to posterior tibial artery bypass graft. 3. No flow is visualized within the native CFA, SFA , or popliteal arteries. No flow was visualized within the tibial arteries. 4. Grossly patent deep femoral artery with a monophasic waveform. 5. No ABI could be obtained on the left. Per report, the ordering provider has been notified of these results. Electronically Signed   By: Katherine Mantlehristopher  Green M.D.   On: 01/20/2020 18:55     LOS: 1 day   Lanae Boastamesh Alec Jaros, MD [facility]  01/21/2020, 2:05 PM

## 2020-01-21 NOTE — ED Notes (Signed)
Pt states he wants to talk with surgeon prior to signing consent form.  Consent form filled out and placed in pt's bin at this time.

## 2020-01-21 NOTE — Progress Notes (Signed)
Night of surgery note  Patient still having some pain in his left foot but it is dramatically improved.  On physical examination both his right and left feet are hyperemic with brisk capillary refill.  Motor function is significantly improved on the left.  His right thigh demonstrates some fullness but is likely a small hematoma.  Atherosclerotic occlusive disease with rest pain left lower extremity associated with thrombosis of his femorofemoral bypass.  Patient has done well intervention appears to be successful and is tolerating his Aggrastat.  I discussed with the patient the interventions that were performed and where the stents are located.  We will plan to complete his Aggrastat infusion initiate aspirin with Eliquis and hopefully he will be discharged tomorrow afternoon.

## 2020-01-22 DIAGNOSIS — E43 Unspecified severe protein-calorie malnutrition: Secondary | ICD-10-CM | POA: Insufficient documentation

## 2020-01-22 LAB — CBC
HCT: 35.7 % — ABNORMAL LOW (ref 39.0–52.0)
Hemoglobin: 11.8 g/dL — ABNORMAL LOW (ref 13.0–17.0)
MCH: 29.9 pg (ref 26.0–34.0)
MCHC: 33.1 g/dL (ref 30.0–36.0)
MCV: 90.6 fL (ref 80.0–100.0)
Platelets: 174 10*3/uL (ref 150–400)
RBC: 3.94 MIL/uL — ABNORMAL LOW (ref 4.22–5.81)
RDW: 12.5 % (ref 11.5–15.5)
WBC: 8 10*3/uL (ref 4.0–10.5)
nRBC: 0 % (ref 0.0–0.2)

## 2020-01-22 LAB — COMPREHENSIVE METABOLIC PANEL
ALT: 12 U/L (ref 0–44)
AST: 20 U/L (ref 15–41)
Albumin: 3.3 g/dL — ABNORMAL LOW (ref 3.5–5.0)
Alkaline Phosphatase: 61 U/L (ref 38–126)
Anion gap: 9 (ref 5–15)
BUN: 21 mg/dL (ref 8–23)
CO2: 27 mmol/L (ref 22–32)
Calcium: 9.1 mg/dL (ref 8.9–10.3)
Chloride: 105 mmol/L (ref 98–111)
Creatinine, Ser: 0.91 mg/dL (ref 0.61–1.24)
GFR, Estimated: >60 mL/min (ref >60)
Glucose, Bld: 143 mg/dL — ABNORMAL HIGH (ref 70–99)
Potassium: 3.7 mmol/L (ref 3.5–5.1)
Sodium: 141 mmol/L (ref 135–145)
Total Bilirubin: 0.4 mg/dL (ref 0.3–1.2)
Total Protein: 6.6 g/dL (ref 6.5–8.1)

## 2020-01-22 MED ORDER — CALCIUM CARBONATE ANTACID 500 MG PO CHEW
1.0000 | CHEWABLE_TABLET | Freq: Three times a day (TID) | ORAL | Status: DC | PRN
  Administered 2020-01-22 – 2020-01-23 (×3): 200 mg via ORAL
  Filled 2020-01-22 (×3): qty 1

## 2020-01-22 MED ORDER — ENSURE ENLIVE PO LIQD
237.0000 mL | Freq: Three times a day (TID) | ORAL | Status: DC
  Administered 2020-01-22 – 2020-01-24 (×6): 237 mL via ORAL

## 2020-01-22 MED ORDER — ADULT MULTIVITAMIN W/MINERALS CH
1.0000 | ORAL_TABLET | Freq: Every day | ORAL | Status: DC
  Administered 2020-01-23 – 2020-01-24 (×2): 1 via ORAL
  Filled 2020-01-22 (×2): qty 1

## 2020-01-22 MED ORDER — APIXABAN 5 MG PO TABS
5.0000 mg | ORAL_TABLET | Freq: Two times a day (BID) | ORAL | Status: DC
  Administered 2020-01-22 – 2020-01-24 (×4): 5 mg via ORAL
  Filled 2020-01-22 (×4): qty 1

## 2020-01-22 MED ORDER — GABAPENTIN 600 MG PO TABS
300.0000 mg | ORAL_TABLET | Freq: Three times a day (TID) | ORAL | Status: DC
  Administered 2020-01-22 (×2): 300 mg via ORAL
  Filled 2020-01-22 (×2): qty 1

## 2020-01-22 MED ORDER — ENOXAPARIN SODIUM 40 MG/0.4ML ~~LOC~~ SOLN: 40.0000 mg | SUBCUTANEOUS | Status: DC

## 2020-01-22 MED ORDER — DIPHENHYDRAMINE HCL 25 MG PO CAPS
25.0000 mg | ORAL_CAPSULE | Freq: Four times a day (QID) | ORAL | Status: DC | PRN
  Administered 2020-01-22 – 2020-01-23 (×2): 25 mg via ORAL
  Filled 2020-01-22 (×2): qty 1

## 2020-01-22 MED ORDER — CALCIUM CARBONATE ANTACID 500 MG PO CHEW
400.0000 mg | CHEWABLE_TABLET | Freq: Once | ORAL | Status: AC
  Administered 2020-01-22: 02:00:00 400 mg via ORAL
  Filled 2020-01-22 (×2): qty 2

## 2020-01-22 NOTE — Plan of Care (Signed)
No acute events overnight. Pt had 7 beat run of VT this AM. Pain complaints in bilateral Les. Given morphine x1, and oxy x1. Also asked for TUMS, given x1. Will continue to monitor.  Problem: Education: Goal: Knowledge of General Education information will improve Description: Including pain rating scale, medication(s)/side effects and non-pharmacologic comfort measures Outcome: Progressing   Problem: Health Behavior/Discharge Planning: Goal: Ability to manage health-related needs will improve Outcome: Progressing   Problem: Clinical Measurements: Goal: Ability to maintain clinical measurements within normal limits will improve Outcome: Progressing Goal: Will remain free from infection Outcome: Progressing Goal: Diagnostic test results will improve Outcome: Progressing Goal: Respiratory complications will improve Outcome: Progressing Goal: Cardiovascular complication will be avoided Outcome: Progressing   Problem: Activity: Goal: Risk for activity intolerance will decrease Outcome: Progressing   Problem: Nutrition: Goal: Adequate nutrition will be maintained Outcome: Progressing   Problem: Coping: Goal: Level of anxiety will decrease Outcome: Progressing   Problem: Elimination: Goal: Will not experience complications related to bowel motility Outcome: Progressing Goal: Will not experience complications related to urinary retention Outcome: Progressing   Problem: Pain Managment: Goal: General experience of comfort will improve Outcome: Progressing   Problem: Safety: Goal: Ability to remain free from injury will improve Outcome: Progressing   Problem: Skin Integrity: Goal: Risk for impaired skin integrity will decrease Outcome: Progressing

## 2020-01-22 NOTE — Progress Notes (Signed)
PROGRESS NOTE    Christopher Bryan.  CWU:[id number] DOB: 09/20/1960 DOA: 01/20/2020 PCP: [facility]   Brief Narrative:  59 year old male with significant history of severe peripheral arterial disease status post multiple surgeries and recent fem-fem bypass in August 2021, hypertension, nicotine dependence, chronic diastolic CHF, COPD presented to the ED 12/20 with worsening pain in his left leg, onset 5 days prior to admission, unable to ambulate.  He has been on Lovenox since his recent surgery but missed 1 week of his recommended therapy and recently started back on Lovenox treatment. Patient was seen in the ED found to have acute left lower extremity arterial occlusion, vascular surgery was consulted, started on heparin drip and planned for OR 12/21  12/22: Status post OR with vascular.  Successful PTA and stent placement of right external iliac.  Successful PTA right common femoral.  Mechanical thrombectomy of femoral-femoral bypass.  PTA and stent placement left common femoral artery.  This morning patient is hemodynamically stable.  He complains of severe pain in his left foot.  No pain in right   Assessment & Plan:   Principal Problem:   Arterial occlusion, lower extremity (HCC) Active Problems:   Dehydration   Hypertension   Nicotine dependence   COPD (chronic obstructive pulmonary disease) with emphysema (HCC)   PAD (peripheral artery disease) (HCC)   Protein-calorie malnutrition, severe  Severe peripheral arterial disease Ischemic left lower extremity Status post or with vascular on 01/21/2020 Extensive vascular procedure with multiple stent placement and PTA Plan: Continue antiplatelet therapy  completed Aggrastat infusion Multimodal pain control Appreciate vascular follow-up  Hypertension:  Blood pressure overall is stable.   Holding Coreg and lisinopril for now Can likely restart in a.m.  Nicotine dependence He continues to smoke but has been  cutting down, cessation advised strongly.  Declined nicotine patch.  COPD with emphysema not in exacerbation, stable continue supplemental oxygen, inhaled steroids,Spiriva and bronchodilators.  DVT prophylaxis: Lovenox Code Status: DNR Family Communication: None today Disposition Plan: Status is: Inpatient  Remains inpatient appropriate because:Inpatient level of care appropriate due to severity of illness   Dispo: The patient is from: Home              Anticipated d/c is to: Home              Anticipated d/c date is: 1 day              Patient currently is not medically stable to d/c.   Patient still with fairly significant postoperative pain.  Pending vascular surgery follow-up.  Anticipate discharge home on 01/23/2020      Consultants:   Vascular  Procedures:   Multiple stent placements 01/21/2020  Antimicrobials:   None   Subjective: Seen and examined.  Endorses pain in left foot.  No other complaints  Objective: Vitals:   01/21/20 1500 01/21/20 2048 01/22/20 0350 01/22/20 0509  BP: (!) 160/79 117/75 (!) 128/47   Pulse:  83 66   Resp: 16     Temp: (!) 97.5 F (36.4 C) 98.7 F (37.1 C) 98.5 F (36.9 C)   TempSrc: Oral Oral Oral   SpO2: 100% 94% 98%   Weight:    51.8 kg  Height:        Intake/Output Summary (Last 24 hours) at 01/22/2020 1528 Last data filed at 01/22/2020 1021 Gross per 24 hour  Intake 419.86 ml  Output 900 ml  Net -480.14 ml   Filed Weights   01/20/20  1044 01/22/20 0509  Weight: 57.3 kg 51.8 kg    Examination:  General exam: Appears calm and comfortable  Respiratory system: Clear to auscultation. Respiratory effort normal. Cardiovascular system: S1 & S2 heard, RRR. No JVD, murmurs, rubs, gallops or clicks. No pedal edema. Gastrointestinal system: Abdomen is nondistended, soft and nontender. No organomegaly or masses felt. Normal bowel sounds heard. Central nervous system: Alert and oriented. No focal neurological  deficits. Extremities: Lower extremities discolored.  Left lower extremity painful to touch. Skin: No rashes, lesions or ulcers Psychiatry: Judgement and insight appear normal. Mood & affect appropriate.     Data Reviewed: I have personally reviewed following labs and imaging studies  CBC: Recent Labs  Lab 01/20/20 1051 01/21/20 0614 01/22/20 0525  WBC 4.3 3.9* 8.0  HGB 14.0 12.3* 11.8*  HCT 43.8 38.4* 35.7*  MCV 92.0 91.9 90.6  PLT 200 186 174   Basic Metabolic Panel: Recent Labs  Lab 01/20/20 1051 01/21/20 0614 01/22/20 0525  NA 139 137 141  K 4.8 4.7 3.7  CL 103 105 105  CO2 28 26 27   GLUCOSE 93 139* 143*  BUN 30* 25* 21  CREATININE 1.50* 1.06 0.91  CALCIUM 9.4 9.2 9.1   GFR: Estimated Creatinine Clearance: 62.5 mL/min (by C-G formula based on SCr of 0.91 mg/dL). Liver Function Tests: Recent Labs  Lab 01/22/20 0525  AST 20  ALT 12  ALKPHOS 61  BILITOT 0.4  PROT 6.6  ALBUMIN 3.3*   No results for input(s): LIPASE, AMYLASE in the last 168 hours. No results for input(s): AMMONIA in the last 168 hours. Coagulation Profile: Recent Labs  Lab 01/20/20 1622  INR 0.8   Cardiac Enzymes: No results for input(s): CKTOTAL, CKMB, CKMBINDEX, TROPONINI in the last 168 hours. BNP (last 3 results) No results for input(s): PROBNP in the last 8760 hours. HbA1C: No results for input(s): HGBA1C in the last 72 hours. CBG: No results for input(s): GLUCAP in the last 168 hours. Lipid Profile: No results for input(s): CHOL, HDL, LDLCALC, TRIG, CHOLHDL, LDLDIRECT in the last 72 hours. Thyroid Function Tests: No results for input(s): TSH, T4TOTAL, FREET4, T3FREE, THYROIDAB in the last 72 hours. Anemia Panel: No results for input(s): VITAMINB12, FOLATE, FERRITIN, TIBC, IRON, RETICCTPCT in the last 72 hours. Sepsis Labs: No results for input(s): PROCALCITON, LATICACIDVEN in the last 168 hours.  Recent Results (from the past 240 hour(s))  Resp Panel by RT-PCR (Flu A&B,  Covid) Nasopharyngeal Swab     Status: None   Collection Time: 01/20/20  5:37 PM   Specimen: Nasopharyngeal Swab; Nasopharyngeal(NP) swabs in vial transport medium  Result Value Ref Range Status   SARS Coronavirus 2 by RT PCR NEGATIVE NEGATIVE Final    Comment: (NOTE) SARS-CoV-2 target nucleic acids are NOT DETECTED.  The SARS-CoV-2 RNA is generally detectable in upper respiratory specimens during the acute phase of infection. The lowest concentration of SARS-CoV-2 viral copies this assay can detect is 138 copies/mL. A negative result does not preclude SARS-Cov-2 infection and should not be used as the sole basis for treatment or other patient management decisions. A negative result may occur with  improper specimen collection/handling, submission of specimen other than nasopharyngeal swab, presence of viral mutation(s) within the areas targeted by this assay, and inadequate number of viral copies(<138 copies/mL). A negative result must be combined with clinical observations, patient history, and epidemiological information. The expected result is Negative.  Fact Sheet for Patients:  BloggerCourse.comhttps://www.fda.gov/media/152166/download  Fact Sheet for Healthcare Providers:  SeriousBroker.ithttps://www.fda.gov/media/152162/download  This test is no t yet approved or cleared by the [place] and  has been authorized for detection and/or diagnosis of SARS-CoV-2 by FDA under an Emergency Use Authorization (EUA). This EUA will remain  in effect (meaning this test can be used) for the duration of the COVID-19 declaration under Section 564(b)(1) of the Act, 21 U.S.C.section 360bbb-3(b)(1), unless the authorization is terminated  or revoked sooner.       Influenza A by PCR NEGATIVE NEGATIVE Final   Influenza B by PCR NEGATIVE NEGATIVE Final    Comment: (NOTE) The Xpert Xpress SARS-CoV-2/FLU/RSV plus assay is intended as an aid in the diagnosis of influenza from Nasopharyngeal swab specimens and should  not be used as a sole basis for treatment. Nasal washings and aspirates are unacceptable for Xpert Xpress SARS-CoV-2/FLU/RSV testing.  Fact Sheet for Patients: BloggerCourse.com  Fact Sheet for Healthcare Providers: SeriousBroker.it  This test is not yet approved or cleared by the [place] FDA and has been authorized for detection and/or diagnosis of SARS-CoV-2 by FDA under an Emergency Use Authorization (EUA). This EUA will remain in effect (meaning this test can be used) for the duration of the COVID-19 declaration under Section 564(b)(1) of the Act, 21 U.S.C. section 360bbb-3(b)(1), unless the authorization is terminated or revoked.  Performed at [facility], [address] [place], [place] [address]          Radiology Studies: PERIPHERAL VASCULAR CATHETERIZATION  Result Date: 01/21/2020 See op note  [place] Lower Ext Art Left  Result Date: 01/20/2020 CLINICAL DATA:  History of PAC with multiple bypasses an acute pain and cold extremity. EXAM: LEFT LOWER EXTREMITY ARTERIAL DUPLEX SCAN TECHNIQUE: Gray-scale sonography as well as color Doppler and duplex ultrasound was performed to evaluate the lower extremity arteries including the common, superficial and profunda femoral arteries, popliteal artery and calf arteries. COMPARISON:  None. FINDINGS: Left lower Extremity ABI: Could not be obtained There is a left fem-fem bypass graft that is occluded, age indeterminate. There is no flow within the left common femoral artery. Flow is visualized within the left profundus femoris artery. The waveform is monophasic. No flow is visualized within the left superficial femoral artery. No flow is visualized in the left popliteal artery. The patient has an apparent left CFA to posterior tibial artery graft that is occluded. IMPRESSION: 1. Age-indeterminate complete occlusion of the patient's fem-fem bypass graft. 2. Age-indeterminate  complete occlusion of what appears to represent a left CFA to posterior tibial artery bypass graft. 3. No flow is visualized within the native CFA, SFA , or popliteal arteries. No flow was visualized within the tibial arteries. 4. Grossly patent deep femoral artery with a monophasic waveform. 5. No ABI could be obtained on the left. Per report, the ordering provider has been notified of these results. Electronically Signed   By: Katherine Mantle M.D.   On: 01/20/2020 18:55        Scheduled Meds: . atorvastatin  10 mg Oral q1800  . clopidogrel  75 mg Oral Q breakfast  . feeding supplement  237 mL Oral TID BM  . gabapentin  300 mg Oral TID  . [START ON 01/23/2020] multivitamin with minerals  1 tablet Oral Daily  . sodium chloride flush  3 mL Intravenous Q12H   Continuous Infusions: . sodium chloride       LOS: 2 days    Time spent: 25 minutes    Tresa Moore, MD [facility] Pager [phone]  If  7PM-7AM, please contact night-coverage 01/22/2020, 3:28 PM

## 2020-01-22 NOTE — Progress Notes (Signed)
[facility]  Daily Progress Note   Subjective  - 1 Day Post-angio  Patient is resting comfortably when I entered the room.  When I questioned him he states that his left foot is still hurting quite a bit.  Objective Vitals:   01/21/20 2048 01/22/20 0350 01/22/20 0509 01/22/20 1542  BP: 117/75 (!) 128/47  (!) 132/56  Pulse: 83 66  70  Resp:      Temp: 98.7 F (37.1 C) 98.5 F (36.9 C)  97.9 F (36.6 C)  TempSrc: Oral Oral  Oral  SpO2: 94% 98%  98%  Weight:   51.8 kg   Height:        Intake/Output Summary (Last 24 hours) at 01/22/2020 2010 Last data filed at 01/22/2020 1852 Gross per 24 hour  Intake 779.86 ml  Output 800 ml  Net -20.14 ml    PULM  Normal effort , no use of accessory muscles CV  No JVD, RRR Abd      No distended, nontender VASC  left foot is warm hyperemic well-perfused  Laboratory CBC    Component Value Date/Time   WBC 8.0 01/22/2020 0525   HGB 11.8 (L) 01/22/2020 0525   HCT 35.7 (L) 01/22/2020 0525   PLT 174 01/22/2020 0525    BMET    Component Value Date/Time   NA 141 01/22/2020 0525   K 3.7 01/22/2020 0525   CL 105 01/22/2020 0525   CO2 27 01/22/2020 0525   GLUCOSE 143 (H) 01/22/2020 0525   BUN 21 01/22/2020 0525   CREATININE 0.91 01/22/2020 0525   CALCIUM 9.1 01/22/2020 0525   GFRNONAA >60 01/22/2020 0525   GFRAA >60 01/11/2018 0356    Assessment/Planning: 1.  Atherosclerotic occlusive disease bilateral lower extremities with rest pain of the left lower extremity: Patient continues to do well.  I have restarted his Eliquis.  He has completed his Aggrastat.  I will continue with Plavix as well.  Obtaining his medications will be a problem since all of his medications have been through the [place] and at the very least they are mailed to him but he cannot go that many days without his Eliquis and Plavix.  I think if we could get discharge planning to help tomorrow try to clarify his medications we could discharge him  from the hospital.  2.  Contrast allergy significant: The patient's itching seems to have resolved so I do not think he needs continued steroid therapy.    3.  COPD: Continue pulmonary medications and aerosols as already ordered, these medications have been reviewed and there are no changes at this time.  4.  Hypertension:Continue antihypertensive medications as already ordered, these medications have been reviewed and there are no changes at this time.  85% of what we do Levora Dredge  01/22/2020, 8:10 PM

## 2020-01-22 NOTE — Progress Notes (Addendum)
Initial Nutrition Assessment  DOCUMENTATION CODES:   Severe malnutrition in context of chronic illness  INTERVENTION:   Ensure Enlive po TID, each supplement provides 350 kcal and 20 grams of protein  MVI daily   Liberalize diet   NUTRITION DIAGNOSIS:   Severe Malnutrition related to chronic illness (COPD, CHF) as evidenced by severe fat depletion,severe muscle depletion.  GOAL:   Patient will meet greater than or equal to 90% of their needs  MONITOR:   PO intake,Supplement acceptance,Labs,Weight trends,Skin,I & O's  REASON FOR ASSESSMENT:   Malnutrition Screening Tool    ASSESSMENT:   59 year old male with significant history of severe peripheral arterial disease status post multiple surgeries and recent fem-fem bypass in August 2021, hypertension, nicotine dependence, chronic diastolic CHF, COPD presented to the ED 12/20 with worsening pain in his left leg and was found to have acute left lower extremity arterial occlusion   Pt s/p vascular intervention 12/21  Met with pt in room today. Pt reports good appetite and oral intake pta and in hospital; pt currently eating 100% of meals. Pt reports a 30lb(21%) weight loss since August. Pt reports that he had a clot in his abdomen in August and that he was unable to eat for several months. Pt reports that his appetite has improved back to baseline now but he has not gained back his weight. Pt reports that he does enjoy Ensure supplements and he has been drinking these at home some. There is no recent weight documentation in chart to confirm weight loss but pt does meet for severe malnutrition on exam today. Pt's last documented weight in chart was 159lbs from 12/2017. RD will add supplements and MVI to help pt meet his estimated needs. RD will liberalize the heart healthy portion of pt's diet as this is restrictive of protein.    Medications reviewed and include: plavix  Labs reviewed:   NUTRITION - FOCUSED PHYSICAL  EXAM:  Flowsheet Row Most Recent Value  Orbital Region Moderate depletion  Upper Arm Region Severe depletion  Thoracic and Lumbar Region Severe depletion  Buccal Region Moderate depletion  Temple Region Moderate depletion  Clavicle Bone Region Severe depletion  Clavicle and Acromion Bone Region Severe depletion  Scapular Bone Region Severe depletion  Dorsal Hand Moderate depletion  Patellar Region Severe depletion  Anterior Thigh Region Severe depletion  Posterior Calf Region Severe depletion  Edema (RD Assessment) None  Hair Reviewed  Eyes Reviewed  Mouth Reviewed  Skin Reviewed  Nails Reviewed     Diet Order:   Diet Order            Diet 2 gram sodium Room service appropriate? Yes; Fluid consistency: Thin  Diet effective now                EDUCATION NEEDS:   Education needs have been addressed  Skin:  Skin Assessment: Reviewed RN Assessment  Last BM:  12/20  Height:   Ht Readings from Last 1 Encounters:  01/20/20 '5\' 10"'  (1.778 m)    Weight:   Wt Readings from Last 1 Encounters:  01/22/20 51.8 kg    Ideal Body Weight:  75.45 kg  BMI:  Body mass index is 16.39 kg/m.  Estimated Nutritional Needs:   Kcal:  1700-2000kcal/day  Protein:  85-95g/day  Fluid:  1.5-1.8L/day  Koleen Distance MS, RD, LDN Please refer to [facility] for RD and/or RD on-call/weekend/after hours pager

## 2020-01-23 MED ORDER — CALCIUM CARBONATE ANTACID 500 MG PO CHEW
2.0000 | CHEWABLE_TABLET | Freq: Three times a day (TID) | ORAL | Status: DC | PRN
  Administered 2020-01-23: 23:00:00 200 mg via ORAL
  Filled 2020-01-23: qty 2

## 2020-01-23 MED ORDER — GABAPENTIN 400 MG PO CAPS
400.0000 mg | ORAL_CAPSULE | Freq: Three times a day (TID) | ORAL | Status: DC
  Administered 2020-01-23 – 2020-01-24 (×4): 400 mg via ORAL
  Filled 2020-01-23 (×4): qty 1

## 2020-01-23 NOTE — Progress Notes (Signed)
PROGRESS NOTE    Christopher Bryan.  WFU:[id number] DOB: Sep 21, 1960 DOA: 01/20/2020 PCP: [facility]   Brief Narrative:  59 year old male with significant history of severe peripheral arterial disease status post multiple surgeries and recent fem-fem bypass in August 2021, hypertension, nicotine dependence, chronic diastolic CHF, COPD presented to the ED 12/20 with worsening pain in his left leg, onset 5 days prior to admission, unable to ambulate.  He has been on Lovenox since his recent surgery but missed 1 week of his recommended therapy and recently started back on Lovenox treatment. Patient was seen in the ED found to have acute left lower extremity arterial occlusion, vascular surgery was consulted, started on heparin drip and planned for OR 12/21  12/22: Status post OR with vascular.  Successful PTA and stent placement of right external iliac.  Successful PTA right common femoral.  Mechanical thrombectomy of femoral-femoral bypass.  PTA and stent placement left common femoral artery.  This morning patient is hemodynamically stable.  He complains of severe pain in his left foot.  No pain in right.  12/23: Pain control improving but patient remains unsteady on his feet.   Assessment & Plan:   Principal Problem:   Arterial occlusion, lower extremity (HCC) Active Problems:   Dehydration   Hypertension   Nicotine dependence   COPD (chronic obstructive pulmonary disease) with emphysema (HCC)   PAD (peripheral artery disease) (HCC)   Protein-calorie malnutrition, severe  Severe peripheral arterial disease Ischemic left lower extremity Status post or with vascular on 01/21/2020 Extensive vascular procedure with multiple stent placement and PTA Plan: Continue antiplatelet therapy  completed Aggrastat infusion Multimodal pain control Appreciate vascular follow-up Anticipate discharge home tomorrow 01/24/20  Hypertension:  Blood pressure overall is stable.    Holding Coreg and lisinopril for now Restart at time of discharge  Nicotine dependence He continues to smoke but has been cutting down, cessation advised strongly.  Declined nicotine patch.  COPD with emphysema not in exacerbation, stable continue supplemental oxygen, inhaled steroids,Spiriva and bronchodilators.  DVT prophylaxis: Lovenox Code Status: DNR Family Communication: Daughter at bedside Disposition Plan: Status is: Inpatient  Remains inpatient appropriate because:Inpatient level of care appropriate due to severity of illness   Dispo: The patient is from: Home              Anticipated d/c is to: Home              Anticipated d/c date is: 1 day              Patient currently is not medically stable to d/c.   Patient still with fairly significant postoperative pain.  Will optimize pain control and plan on discharge home 12/24.     Consultants:   Vascular  Procedures:   Multiple stent placements 01/21/2020  Antimicrobials:   None   Subjective: Seen and examined.  Improved pain control   Objective: Vitals:   01/23/20 0307 01/23/20 0348 01/23/20 0811 01/23/20 1217  BP:  121/61 119/68 111/84  Pulse:  (!) 58 71 (!) 103  Resp:   16 16  Temp:  98 F (36.7 C) 97.9 F (36.6 C) 98.4 F (36.9 C)  TempSrc:  Oral Oral   SpO2:  97% 98% 99%  Weight: 55.7 kg     Height:        Intake/Output Summary (Last 24 hours) at 01/23/2020 1431 Last data filed at 01/23/2020 1330 Gross per 24 hour  Intake 600 ml  Output 1600 ml  Net -1000 ml   Filed Weights   01/20/20 1044 01/22/20 0509 01/23/20 0307  Weight: 57.3 kg 51.8 kg 55.7 kg    Examination:  General exam: Appears calm and comfortable  Respiratory system: Clear to auscultation. Respiratory effort normal. Cardiovascular system: S1 & S2 heard, RRR. No JVD, murmurs, rubs, gallops or clicks. No pedal edema. Gastrointestinal system: Abdomen is nondistended, soft and nontender. No organomegaly or masses  felt. Normal bowel sounds heard. Central nervous system: Alert and oriented. No focal neurological deficits. Extremities: Lower extremities discolored.  Left lower extremity painful to touch. Skin: No rashes, lesions or ulcers Psychiatry: Judgement and insight appear normal. Mood & affect appropriate.     Data Reviewed: I have personally reviewed following labs and imaging studies  CBC: Recent Labs  Lab 01/20/20 1051 01/21/20 0614 01/22/20 0525  WBC 4.3 3.9* 8.0  HGB 14.0 12.3* 11.8*  HCT 43.8 38.4* 35.7*  MCV 92.0 91.9 90.6  PLT 200 186 174   Basic Metabolic Panel: Recent Labs  Lab 01/20/20 1051 01/21/20 0614 01/22/20 0525  NA 139 137 141  K 4.8 4.7 3.7  CL 103 105 105  CO2 28 26 27   GLUCOSE 93 139* 143*  BUN 30* 25* 21  CREATININE 1.50* 1.06 0.91  CALCIUM 9.4 9.2 9.1   GFR: Estimated Creatinine Clearance: 67.2 mL/min (by C-G formula based on SCr of 0.91 mg/dL). Liver Function Tests: Recent Labs  Lab 01/22/20 0525  AST 20  ALT 12  ALKPHOS 61  BILITOT 0.4  PROT 6.6  ALBUMIN 3.3*   No results for input(s): LIPASE, AMYLASE in the last 168 hours. No results for input(s): AMMONIA in the last 168 hours. Coagulation Profile: Recent Labs  Lab 01/20/20 1622  INR 0.8   Cardiac Enzymes: No results for input(s): CKTOTAL, CKMB, CKMBINDEX, TROPONINI in the last 168 hours. BNP (last 3 results) No results for input(s): PROBNP in the last 8760 hours. HbA1C: No results for input(s): HGBA1C in the last 72 hours. CBG: No results for input(s): GLUCAP in the last 168 hours. Lipid Profile: No results for input(s): CHOL, HDL, LDLCALC, TRIG, CHOLHDL, LDLDIRECT in the last 72 hours. Thyroid Function Tests: No results for input(s): TSH, T4TOTAL, FREET4, T3FREE, THYROIDAB in the last 72 hours. Anemia Panel: No results for input(s): VITAMINB12, FOLATE, FERRITIN, TIBC, IRON, RETICCTPCT in the last 72 hours. Sepsis Labs: No results for input(s): PROCALCITON, LATICACIDVEN in  the last 168 hours.  Recent Results (from the past 240 hour(s))  Resp Panel by RT-PCR (Flu A&B, Covid) Nasopharyngeal Swab     Status: None   Collection Time: 01/20/20  5:37 PM   Specimen: Nasopharyngeal Swab; Nasopharyngeal(NP) swabs in vial transport medium  Result Value Ref Range Status   SARS Coronavirus 2 by RT PCR NEGATIVE NEGATIVE Final    Comment: (NOTE) SARS-CoV-2 target nucleic acids are NOT DETECTED.  The SARS-CoV-2 RNA is generally detectable in upper respiratory specimens during the acute phase of infection. The lowest concentration of SARS-CoV-2 viral copies this assay can detect is 138 copies/mL. A negative result does not preclude SARS-Cov-2 infection and should not be used as the sole basis for treatment or other patient management decisions. A negative result may occur with  improper specimen collection/handling, submission of specimen other than nasopharyngeal swab, presence of viral mutation(s) within the areas targeted by this assay, and inadequate number of viral copies(<138 copies/mL). A negative result must be combined with clinical observations, patient history, and epidemiological information. The expected result is Negative.  Fact Sheet for Patients:  BloggerCourse.com  Fact Sheet for Healthcare Providers:  SeriousBroker.it  This test is no t yet approved or cleared by the [place] FDA and  has been authorized for detection and/or diagnosis of SARS-CoV-2 by FDA under an Emergency Use Authorization (EUA). This EUA will remain  in effect (meaning this test can be used) for the duration of the COVID-19 declaration under Section 564(b)(1) of the Act, 21 U.S.C.section 360bbb-3(b)(1), unless the authorization is terminated  or revoked sooner.       Influenza A by PCR NEGATIVE NEGATIVE Final   Influenza B by PCR NEGATIVE NEGATIVE Final    Comment: (NOTE) The Xpert Xpress SARS-CoV-2/FLU/RSV plus assay  is intended as an aid in the diagnosis of influenza from Nasopharyngeal swab specimens and should not be used as a sole basis for treatment. Nasal washings and aspirates are unacceptable for Xpert Xpress SARS-CoV-2/FLU/RSV testing.  Fact Sheet for Patients: BloggerCourse.com  Fact Sheet for Healthcare Providers: SeriousBroker.it  This test is not yet approved or cleared by the [place] FDA and has been authorized for detection and/or diagnosis of SARS-CoV-2 by FDA under an Emergency Use Authorization (EUA). This EUA will remain in effect (meaning this test can be used) for the duration of the COVID-19 declaration under Section 564(b)(1) of the Act, 21 U.S.C. section 360bbb-3(b)(1), unless the authorization is terminated or revoked.  Performed at [facility], [address], [place], [place] [address]          Radiology Studies: No results found.      Scheduled Meds: . apixaban  5 mg Oral BID  . atorvastatin  10 mg Oral q1800  . clopidogrel  75 mg Oral Q breakfast  . feeding supplement  237 mL Oral TID BM  . gabapentin  400 mg Oral TID  . multivitamin with minerals  1 tablet Oral Daily  . sodium chloride flush  3 mL Intravenous Q12H   Continuous Infusions: . sodium chloride       LOS: 3 days    Time spent: 15 minutes    Tresa Moore, MD [facility] Pager [phone]  If 7PM-7AM, please contact night-coverage 01/23/2020, 2:31 PM

## 2020-01-23 NOTE — Evaluation (Signed)
Occupational Therapy Evaluation Patient Details Name: Christopher Bryan. MRN: [id number] DOB: 07-10-1960 Today's Date: 01/23/2020    History of Present Illness 59 y.o. male with medical history significant for severe peripheral arterial disease for which he has had multiple surgeries done and is status post recent femorofemoral bypass which was done in August, 2021, history of hypertension, nicotine dependence, chronic diastolic dysfunction CHF and COPD who presents to the emergency room via EMS for evaluation of worsening pain involving his left leg.  Is now s/p L LE angiopasties 01/21/20.   Clinical Impression   [name] was seen for OT evaluation this date. Prior to hospital admission, pt was Independent for mobility and I/ADLs including caring for his 1 yr dog. Pt lives alone in mobile home, reports newphew lives nearby and works from home, brother-in-law also in the neighbourhood. Pt presents to acute OT demonstrating impaired ADL performance and functional mobility 2/2 decreased activty tolerance and functional strength/balance deficits.   Pt currently requires SUPERVISION + RW standing grooming - intermittent single UE support. SBA + RW for ~80 ft mobility, 1 standing rest break. Pt would benefit from skilled OT to address noted impairments and functional limitations (see below for any additional details) in order to maximize safety and independence while minimizing falls risk and caregiver burden. Upon hospital discharge, anticipate no follow up OT needs, but will follow surgeon's recommendations.     Follow Up Recommendations  Follow surgeon's recommendation for DC plan and follow-up therapies    Equipment Recommendations  None recommended by OT    Recommendations for Other Services       Precautions / Restrictions Precautions Precautions: Fall Restrictions Weight Bearing Restrictions: Yes LLE Weight Bearing: Weight bearing as tolerated      Mobility Bed  Mobility Overal bed mobility: Independent             General bed mobility comments: Pt received and left up in chair    Transfers Overall transfer level: Modified independent Equipment used: Rolling walker (2 wheeled)             General transfer comment: Increased time    Balance Overall balance assessment: Needs assistance Sitting-balance support: No upper extremity supported Sitting balance-Leahy Scale: Good Sitting balance - Comments: able to sit EOB w/o issue   Standing balance support: Single extremity supported;During functional activity Standing balance-Leahy Scale: Good Standing balance comment: Single UE support static/dynamic standing                           ADL either performed or assessed with clinical judgement   ADL Overall ADL's : Needs assistance/impaired                                       General ADL Comments: SUPERVISION + RW standing grooming - intermittent single UE support                  Pertinent Vitals/Pain Pain Assessment: Faces Faces Pain Scale: Hurts little more Pain Location: L foot Pain Descriptors / Indicators: Dull;Discomfort Pain Intervention(s): Premedicated before session;Repositioned     Hand Dominance     Extremity/Trunk Assessment Upper Extremity Assessment Upper Extremity Assessment: Overall WFL for tasks assessed   Lower Extremity Assessment Lower Extremity Assessment: Generalized weakness       Communication Communication Communication: No difficulties   Cognition Arousal/Alertness: Awake/alert Behavior  During Therapy: WFL for tasks assessed/performed Overall Cognitive Status: Within Functional Limits for tasks assessed                                     General Comments       Exercises Exercises: Other exercises Other Exercises Other Exercises: Pt educated re: OT role, DME recs, d/c recs, falls prevention, pet mgmt, ECS, importance of mobility for  functional strengthening Other Exercises: LBD, grooming, sit<>stand, sitting/standing balance/toelrance, ~80 ft mobility, static/dynamic standing balance   Shoulder Instructions      Home Living Family/patient expects to be discharged to:: Private residence Living Arrangements: Alone Available Help at Discharge: Family (nephew and brother in law close) Type of Home: Mobile home Home Access: Stairs to enter Secretary/administrator of Steps: 4 Entrance Stairs-Rails: Can reach both (pt reports as "shaky") Home Layout: One level     Bathroom Shower/Tub: Chief Strategy Officer: Handicapped height Bathroom Accessibility: Yes   Home Equipment: Cane - single point          Prior Functioning/Environment Level of Independence: Independent        Comments: Pt does not drive, but reports that he is able to do all ADLs, home management, etc        OT Problem List: Decreased strength;Decreased activity tolerance;Impaired balance (sitting and/or standing)      OT Treatment/Interventions: Self-care/ADL training;Therapeutic exercise;Energy conservation;DME and/or AE instruction;Therapeutic activities;Patient/family education;Balance training    OT Goals(Current goals can be found in the care plan section) Acute Rehab OT Goals Patient Stated Goal: go home OT Goal Formulation: With patient Time For Goal Achievement: 02/06/20 Potential to Achieve Goals: Good ADL Goals Pt Will Perform Grooming: Independently;standing Pt Will Transfer to Toilet: Independently;ambulating;regular height toilet Additional ADL Goal #1: Pt will Independently verbalize plan to  implement x3 falls prevention strategies  OT Frequency: Min 1X/week    AM-PAC OT "6 Clicks" Daily Activity     Outcome Measure Help from another person eating meals?: None Help from another person taking care of personal grooming?: None Help from another person toileting, which includes using toliet, bedpan, or urinal?: A  Little Help from another person bathing (including washing, rinsing, drying)?: A Little Help from another person to put on and taking off regular upper body clothing?: None Help from another person to put on and taking off regular lower body clothing?: A Little 6 Click Score: 21   End of Session Equipment Utilized During Treatment: Rolling walker  Activity Tolerance: Patient tolerated treatment well Patient left: in chair;with call bell/phone within reach  OT Visit Diagnosis: Other abnormalities of gait and mobility (R26.89)                Time: 1424-1440 OT Time Calculation (min): 16 min Charges:  OT General Charges $OT Visit: 1 Visit OT Evaluation $OT Eval Low Complexity: 1 Low OT Treatments $Self Care/Home Management : 8-22 mins  Kathie Dike, M.S. OTR/L  01/23/20, 2:55 PM  ascom [phone]

## 2020-01-23 NOTE — Evaluation (Signed)
Physical Therapy Evaluation Patient Details Name: Christopher Bryan. MRN: [id number] DOB: [birth year] Today's Date: 01/23/2020   History of Present Illness  59 y.o. male with medical history significant for severe peripheral arterial disease for which he has had multiple surgeries done and is status post recent femorofemoral bypass which was done in August, 2021, history of hypertension, nicotine dependence, chronic diastolic dysfunction CHF and COPD who presents to the emergency room via EMS for evaluation of worsening pain involving his left leg.  Is now s/p L LE angiopasties 01/21/20.  Clinical Impression  Pt eager and willing to get up and work with PT, he showed good effort but did fatigue quickly with ~50 ft ambulation.  He reports that he will have plenty of help from family when he does go home.  We were not able to try steps this session secondary to fatigue, though PT did educate multiple stair negotiation strategies, specifically how to w/o rails as apparently the rails currently in place are not very steady.  Pt likely will not require HHPT, but we we maintain on caseload while admitted to maximize mobility and to trial stairs when appropriate.    Follow Up Recommendations Follow surgeon's recommendation for DC plan and follow-up therapies;No PT follow up    Equipment Recommendations  Rolling walker with 5" wheels    Recommendations for Other Services       Precautions / Restrictions Precautions Precautions: Fall (moderate) Restrictions Weight Bearing Restrictions: Yes LLE Weight Bearing: Weight bearing as tolerated      Mobility  Bed Mobility Overal bed mobility: Independent             General bed mobility comments: Pt able to get up to EOB confidently and w/o issue, minimal L LE pain with transition    Transfers Overall transfer level: Modified independent Equipment used: Rolling walker (2 wheeled)             General transfer comment: cuing to insure  appropriate positioning and sequencing, but able to rise 3 seperate times w/o phyiscal assist  Ambulation/Gait Ambulation/Gait assistance: Supervision Gait Distance (Feet): 50 Feet Assistive device: Rolling walker (2 wheeled)       General Gait Details: Pt able to tolerate WBing through L LE but was highly reliant on walker and had gradually increasing fatigue/HR.  got up to 130 bpm and was too fatigued to attempt steps.  Pt did not have any LOBs or safety issues apart from fatigue for modest in-home distances.  Stairs            Wheelchair Mobility    Modified Rankin (Stroke Patients Only)       Balance Overall balance assessment: Needs assistance Sitting-balance support: No upper extremity supported Sitting balance-Leahy Scale: Good Sitting balance - Comments: able to sit EOB w/o issue   Standing balance support: Bilateral upper extremity supported Standing balance-Leahy Scale: Fair Standing balance comment: Pt with no LOBs or overt safety issues, but he was highly reliant on the walker                             Pertinent Vitals/Pain Pain Assessment: No/denies pain    Home Living Family/patient expects to be discharged to:: Private residence Living Arrangements: Alone Available Help at Discharge: Family (cousin lives across the street, other family also close and able to assist as needed) Type of Home: Mobile home Home Access: Stairs to enter Entrance Stairs-Rails: Can reach both (apparently  they are not in the most stable condition) Entrance Stairs-Number of Steps: 4 Home Layout: One level Home Equipment: None      Prior Function Level of Independence: Independent         Comments: Pt does not drive, but reports that he is able to do all ADLs, home management, etc     Hand Dominance        Extremity/Trunk Assessment   Upper Extremity Assessment Upper Extremity Assessment: Overall WFL for tasks assessed    Lower Extremity  Assessment Lower Extremity Assessment: Generalized weakness (R grossly 4-/5, L pain limited but with functional AROM)       Communication   Communication: No difficulties  Cognition Arousal/Alertness: Awake/alert Behavior During Therapy: WFL for tasks assessed/performed Overall Cognitive Status: Within Functional Limits for tasks assessed                                        General Comments      Exercises     Assessment/Plan    PT Assessment Patient needs continued PT services  PT Problem List Decreased strength;Decreased activity tolerance;Decreased range of motion;Decreased mobility;Decreased balance;Decreased knowledge of use of DME;Decreased safety awareness       PT Treatment Interventions DME instruction;Gait training;Stair training;Functional mobility training;Therapeutic activities;Therapeutic exercise;Balance training;Patient/family education    PT Goals (Current goals can be found in the Care Plan section)  Acute Rehab PT Goals Patient Stated Goal: go home PT Goal Formulation: With patient Time For Goal Achievement: 02/06/20 Potential to Achieve Goals: Good    Frequency Min 2X/week   Barriers to discharge        Co-evaluation               AM-PAC PT "6 Clicks" Mobility  Outcome Measure Help needed turning from your back to your side while in a flat bed without using bedrails?: None Help needed moving from lying on your back to sitting on the side of a flat bed without using bedrails?: None Help needed moving to and from a bed to a chair (including a wheelchair)?: None Help needed standing up from a chair using your arms (e.g., wheelchair or bedside chair)?: None Help needed to walk in hospital room?: A Little Help needed climbing 3-5 steps with a railing? : A Little 6 Click Score: 22    End of Session Equipment Utilized During Treatment: Gait belt Activity Tolerance: Patient tolerated treatment well;Patient limited by  fatigue Patient left: in chair;with call bell/phone within reach Nurse Communication: Mobility status PT Visit Diagnosis: Muscle weakness (generalized) (M62.81);Difficulty in walking, not elsewhere classified (R26.2)    Time: 1020-1047 PT Time Calculation (min) (ACUTE ONLY): 27 min   Charges:   PT Evaluation $PT Eval Low Complexity: 1 Low PT Treatments $Gait Training: 8-22 mins        Malachi Pro, DPT 01/23/2020, 1:42 PM

## 2020-01-24 DIAGNOSIS — R9431 Abnormal electrocardiogram [ECG] [EKG]: Secondary | ICD-10-CM

## 2020-01-24 MED ORDER — CLOPIDOGREL BISULFATE 75 MG PO TABS: 75.0000 mg | ORAL_TABLET | Freq: Every day | ORAL | 0 refills | Status: DC

## 2020-01-24 MED ORDER — ALUM & MAG HYDROXIDE-SIMETH 200-200-20 MG/5ML PO SUSP
30.0000 mL | Freq: Once | ORAL | Status: AC
  Administered 2020-01-24: 01:00:00 30 mL via ORAL
  Filled 2020-01-24: qty 30

## 2020-01-24 MED ORDER — APIXABAN 5 MG PO TABS: 5.0000 mg | ORAL_TABLET | Freq: Two times a day (BID) | ORAL | 0 refills | Status: DC

## 2020-01-24 MED ORDER — ONDANSETRON 4 MG PO TBDP: 4.0000 mg | ORAL_TABLET | Freq: Three times a day (TID) | ORAL | 0 refills | Status: DC | PRN

## 2020-01-24 MED ORDER — OXYCODONE HCL 5 MG PO TABS: 5.0000 mg | ORAL_TABLET | ORAL | 0 refills | Status: AC | PRN

## 2020-01-24 MED ORDER — GABAPENTIN 400 MG PO CAPS: 400.0000 mg | ORAL_CAPSULE | Freq: Three times a day (TID) | ORAL | 0 refills | Status: DC

## 2020-01-24 MED ORDER — OXYCODONE HCL 5 MG PO TABS: 5.0000 mg | ORAL_TABLET | ORAL | 0 refills | Status: DC | PRN

## 2020-01-24 NOTE — Discharge Instructions (Signed)
Peripheral Vascular Disease  Peripheral vascular disease (PVD) is a disease of the blood vessels that are not part of your heart and brain. A simple term for PVD is poor circulation. In most cases, PVD narrows the blood vessels that carry blood from your heart to the rest of your body. This can reduce the supply of blood to your arms, legs, and internal organs, like your stomach or kidneys. However, PVD most often affects a person's lower legs and feet. Without treatment, PVD tends to get worse. PVD can also lead to acute ischemic limb. This is when an arm or leg suddenly cannot get enough blood. This is a medical emergency. Follow these instructions at home: Lifestyle  Do not use any products that contain nicotine or tobacco, such as cigarettes and e-cigarettes. If you need help quitting, ask your doctor.  Lose weight if you are overweight. Or, stay at a healthy weight as told by your doctor.  Eat a diet that is low in fat and cholesterol. If you need help, ask your doctor.  Exercise regularly. Ask your doctor for activities that are right for you. General instructions  Take over-the-counter and prescription medicines only as told by your doctor.  Take good care of your feet: ? Wear comfortable shoes that fit well. ? Check your feet often for any cuts or sores.  Keep all follow-up visits as told by your doctor This is important. Contact a doctor if:  You have cramps in your legs when you walk.  You have leg pain when you are at rest.  You have coldness in a leg or foot.  Your skin changes.  You are unable to get or have an erection (erectile dysfunction).  You have cuts or sores on your feet that do not heal. Get help right away if:  Your arm or leg turns cold, numb, and blue.  Your arms or legs become red, warm, swollen, painful, or numb.  You have chest pain.  You have trouble breathing.  You suddenly have weakness in your face, arm, or leg.  You become very  confused or you cannot speak.  You suddenly have a very bad headache.  You suddenly cannot see. Summary  Peripheral vascular disease (PVD) is a disease of the blood vessels.  A simple term for PVD is poor circulation. Without treatment, PVD tends to get worse.  Treatment may include exercise, low fat and low cholesterol diet, and quitting smoking. This information is not intended to replace advice given to you by your health care provider. Make sure you discuss any questions you have with your health care provider. Document Revised: 12/30/2016 Document Reviewed: 02/25/2016 Elsevier Patient Education  2020 Elsevier Inc.  

## 2020-01-24 NOTE — Discharge Summary (Signed)
Physician Discharge Summary  Christopher Bryan. WUJ:[id number] DOB: 04/16/1960 DOA: 01/20/2020  [facility]P: [facility]  Admit date: 01/20/2020 Discharge date: 01/24/2020  Admitted From: Home Disposition:  Home with home health  Recommendations for Outpatient Follow-up:  1. Follow up with PCP in 1-2 weeks 2.   Home Health:Yes Equipment/Devices:None Discharge Condition:Stable CODE STATUS:Full Diet recommendation: Heart Healthy   Brief/Interim Summary:  59 year old male with significant history of severe peripheral arterial disease status post multiple surgeries and recent fem-fembypass in August 2021, hypertension, nicotine dependence, chronic diastolic CHF, COPD presented to the ED 12/20 with worsening pain in his left leg, onset 5 days prior to admission, unable to ambulate. He has been on Lovenox since his recent surgery but missed 1 week of his recommended therapy and recently started back on Lovenox treatment. Patient was seen in the ED found to have acute left lower extremity arterial occlusion, vascular surgery was consulted, started on heparin drip and plannedfor OR 12/21  12/22: Status post OR with vascular.  Successful PTA and stent placement of right external iliac.  Successful PTA right common femoral.  Mechanical thrombectomy of femoral-femoral bypass.  PTA and stent placement left common femoral artery.  This morning patient is hemodynamically stable.  He complains of severe pain in his left foot.  No pain in right.  12/23: Pain control improving but patient remains unsteady on his feet. 12/24: Pain control improved.  Stable for discharge home  Discharge Diagnoses:  Principal Problem:   Arterial occlusion, lower extremity (HCC) Active Problems:   Dehydration   Hypertension   Nicotine dependence   COPD (chronic obstructive pulmonary disease) with emphysema (HCC)   PAD (peripheral artery disease) (HCC)   Protein-calorie malnutrition, severe  Severe  peripheral arterial disease Ischemic left lower extremity Status post or with vascular on 01/21/2020 Extensive vascular procedure with multiple stent placement and PTA  completed Aggrastat infusion Multimodal pain control DAPT indefinitely.  The patient has a mail order pharmacy through the [place] so it may take some time to get his medications.  I spoke at length with the patient, pharmacy, case management.  Will send 1 week of dual antiplatelet therapy to a local pharmacy with coupons and patient hand for reduced cost.  Patient is instructed not to miss any doses of antiplatelet as it could result in stent closure and recurrence of limb ischemia.  He expressed understanding.  Hypertension: Blood pressure overall is stable. Restart home regimen at time of discharge  Nicotine dependence He continues to smoke but has been cutting down, cessation advised strongly. Declined nicotine patch.  COPD with emphysema not in exacerbation, can resume home regimen on discharge   discharge Instructions  Discharge Instructions    Diet - low sodium heart healthy   Complete by: As directed    Increase activity slowly   Complete by: As directed      Allergies as of 01/24/2020      Reactions   Iodine Rash   Ivp Dye [iodinated Diagnostic Agents] Hives      Medication List    TAKE these medications   acetaminophen 325 MG tablet Commonly known as: TYLENOL Take 650 mg by mouth 3 (three) times daily as needed for mild pain or fever.   albuterol 108 (90 Base) MCG/ACT inhaler Commonly known as: VENTOLIN HFA Inhale 2 puffs into the lungs every 6 (six) hours as needed for wheezing or shortness of breath.   apixaban 5 MG Tabs tablet Commonly known as: ELIQUIS Take 1  tablet (5 mg total) by mouth 2 (two) times daily for 7 days.   atorvastatin 20 MG tablet Commonly known as: LIPITOR Take 20 mg by mouth at bedtime.   budesonide-formoterol 160-4.5 MCG/ACT inhaler Commonly known as:  Symbicort Inhale 2 puffs into the lungs 2 (two) times daily.   carboxymethylcellulose 0.5 % Soln Commonly known as: REFRESH PLUS Place 1 drop into both eyes 4 (four) times daily as needed.   carvedilol 25 MG tablet Commonly known as: COREG Take 1 tablet (25 mg total) by mouth 2 (two) times daily with a meal.   cholecalciferol 1000 units tablet Commonly known as: VITAMIN D Take 2,000 Units by mouth daily.   clopidogrel 75 MG tablet Commonly known as: PLAVIX Take 1 tablet (75 mg total) by mouth daily with breakfast for 7 days.   famotidine 20 MG tablet Commonly known as: PEPCID Take 20 mg by mouth 2 (two) times daily as needed for heartburn or indigestion.   gabapentin 800 MG tablet Commonly known as: NEURONTIN Take 800 mg by mouth 3 (three) times daily. What changed: Another medication with the same name was added. Make sure you understand how and when to take each.   gabapentin 400 MG capsule Commonly known as: NEURONTIN Take 1 capsule (400 mg total) by mouth 3 (three) times daily for 20 days. What changed: You were already taking a medication with the same name, and this prescription was added. Make sure you understand how and when to take each.   ipratropium-albuterol 0.5-2.5 (3) MG/3ML Soln Commonly known as: DUONEB Take 3 mLs by nebulization every 6 (six) hours as needed.   lisinopril 10 MG tablet Commonly known as: ZESTRIL Take 20 mg by mouth daily.   loratadine 10 MG tablet Commonly known as: CLARITIN Take 10 mg by mouth daily as needed for allergies.   nicotine 21 mg/24hr patch Commonly known as: NICODERM CQ - dosed in mg/24 hours Place 1 patch (21 mg total) onto the skin daily.   ondansetron 4 MG disintegrating tablet Commonly known as: Zofran ODT Take 1 tablet (4 mg total) by mouth every 8 (eight) hours as needed for nausea or vomiting.   oxyCODONE 5 MG immediate release tablet Commonly known as: Oxy IR/ROXICODONE Take 1 tablet (5 mg total) by mouth  every 4 (four) hours as needed for up to 3 days for moderate pain.   sennosides-docusate sodium 8.6-50 MG tablet Commonly known as: SENOKOT-S Take 1-2 tablets by mouth at bedtime as needed for constipation.   sildenafil 100 MG tablet Commonly known as: VIAGRA Take 100 mg by mouth daily as needed for erectile dysfunction.   tiotropium 18 MCG inhalation capsule Commonly known as: SPIRIVA Place 18 mcg into inhaler and inhale daily.   traZODone 50 MG tablet Commonly known as: DESYREL Take 25 mg by mouth at bedtime.            Durable Medical Equipment  (From admission, onward)         Start     Ordered   01/24/20 0943  For home use only DME Walker  Once       Question:  Patient needs a walker to treat with the following condition  Answer:  Weakness   01/24/20 0942          Follow-up Information    [facility]. Schedule an appointment as soon as possible for a visit in 1 week(s).   Specialty: General Practice Contact information: [address] [place] [id number]  [phone]        Schnier, Latina Craver, MD. Schedule an appointment as soon as possible for a visit in 2 week(s).   Specialties: Vascular Surgery, Cardiology, Radiology, Vascular Surgery Why: with a duplex of the fem fem and an ABI Contact information: [address] [place] [address] [phone]              Allergies  Allergen Reactions  . Iodine Rash  . Ivp Dye [Iodinated Diagnostic Agents] Hives    Consultations:  Vascular surgery   Procedures/Studies: PERIPHERAL VASCULAR CATHETERIZATION  Result Date: 01/21/2020 See op note  US Venous Img Lower Unilateral Left  Result Date: 01/20/2020 CLINICAL DATA:  59 year old male with left lower extremity pain, history of DVT. EXAM: LEFT LOWER EXTREMITY VENOUS DOPPLER ULTRASOUND TECHNIQUE: Gray-scale sonography with graded compression, as well as color Doppler and duplex ultrasound were performed to evaluate the left lower  extremity deep venous systems from the level of the common femoral vein and including the common femoral, femoral, profunda femoral, popliteal and calf veins including the posterior tibial, peroneal and gastrocnemius veins when visible. Spectral Doppler was utilized to evaluate flow at rest and with distal augmentation maneuvers in the common femoral, femoral and popliteal veins. The contralateral common femoral vein was also evaluated for comparison. COMPARISON:  05/26/2010 FINDINGS: LEFT LOWER EXTREMITY Common Femoral Vein: No evidence of thrombus. Normal compressibility, respiratory phasicity and response to augmentation. Central Greater Saphenous Vein: No evidence of thrombus. Normal compressibility and flow on color Doppler imaging. Central Profunda Femoral Vein: No evidence of thrombus. Normal compressibility and flow on color Doppler imaging. Femoral Vein: No evidence of thrombus. Normal compressibility, respiratory phasicity and response to augmentation. Popliteal Vein: No evidence of thrombus. Normal compressibility, respiratory phasicity and response to augmentation. Calf Veins: No evidence of thrombus. Normal compressibility and flow on color Doppler imaging. Other Findings: No significant arterial flow visualized throughout the left lower extremity. RIGHT LOWER EXTREMITY Common Femoral Vein: Persistent synechiae in the right common femoral vein. Partially thrombosed right common femoral artery. IMPRESSION: 1. No evidence of left lower extremity deep vein thrombosis. 2. No significant arterial flow visualized in the left lower extremity on this study. Consider arterial duplex study for further characterization. Marliss Coots, MD [facility] Electronically Signed   By: Marliss Coots MD   On: 01/20/2020 12:47   [place] Lower Ext Art Left  Result Date: 01/20/2020 CLINICAL DATA:  History of PAC with multiple bypasses an acute pain and cold extremity.  EXAM: LEFT LOWER EXTREMITY ARTERIAL DUPLEX SCAN TECHNIQUE: Gray-scale sonography as well as color Doppler and duplex ultrasound was performed to evaluate the lower extremity arteries including the common, superficial and profunda femoral arteries, popliteal artery and calf arteries. COMPARISON:  None. FINDINGS: Left lower Extremity ABI: Could not be obtained There is a left fem-fem bypass graft that is occluded, age indeterminate. There is no flow within the left common femoral artery. Flow is visualized within the left profundus femoris artery. The waveform is monophasic. No flow is visualized within the left superficial femoral artery. No flow is visualized in the left popliteal artery. The patient has an apparent left CFA to posterior tibial artery graft that is occluded. IMPRESSION: 1. Age-indeterminate complete occlusion of the patient's fem-fem bypass graft. 2. Age-indeterminate complete occlusion of what appears to represent a left CFA to posterior tibial artery bypass graft. 3. No flow is visualized within the native CFA, SFA , or popliteal arteries. No flow was visualized  within the tibial arteries. 4. Grossly patent deep femoral artery with a monophasic waveform. 5. No ABI could be obtained on the left. Per report, the ordering provider has been notified of these results. Electronically Signed   By: Katherine Mantle M.D.   On: 01/20/2020 18:55    (Echo, Carotid, EGD, Colonoscopy, ERCP)    Subjective: Seen and examined at the time of discharge.  Stable, no distress.  Pain still present but improved from prior.  Stable for discharge  Discharge Exam: Vitals:   01/24/20 0829 01/24/20 1119  BP: 107/60 (!) 115/59  Pulse: 68 74  Resp: 18 18  Temp: 98.1 F (36.7 C) 98.5 F (36.9 C)  SpO2: 99% 95%   Vitals:   01/24/20 0429 01/24/20 0606 01/24/20 0829 01/24/20 1119  BP: 117/60  107/60 (!) 115/59  Pulse: 82  68 74  Resp:   18 18  Temp: 98.5 F (36.9 C)  98.1 F (36.7 C) 98.5 F (36.9 C)   TempSrc: Oral  Oral Oral  SpO2: 98%  99% 95%  Weight:  52.2 kg    Height:        General: Pt is alert, awake, not in acute distress Cardiovascular: RRR, S1/S2 +, no rubs, no gallops Respiratory: CTA bilaterally, no wheezing, no rhonchi Abdominal: Soft, NT, ND, bowel sounds + Extremities: no edema, no cyanosis    The results of significant diagnostics from this hospitalization (including imaging, microbiology, ancillary and laboratory) are listed below for reference.     Microbiology: Recent Results (from the past 240 hour(s))  Resp Panel by RT-PCR (Flu A&B, Covid) Nasopharyngeal Swab     Status: None   Collection Time: 01/20/20  5:37 PM   Specimen: Nasopharyngeal Swab; Nasopharyngeal(NP) swabs in vial transport medium  Result Value Ref Range Status   SARS Coronavirus 2 by RT PCR NEGATIVE NEGATIVE Final    Comment: (NOTE) SARS-CoV-2 target nucleic acids are NOT DETECTED.  The SARS-CoV-2 RNA is generally detectable in upper respiratory specimens during the acute phase of infection. The lowest concentration of SARS-CoV-2 viral copies this assay can detect is 138 copies/mL. A negative result does not preclude SARS-Cov-2 infection and should not be used as the sole basis for treatment or other patient management decisions. A negative result may occur with  improper specimen collection/handling, submission of specimen other than nasopharyngeal swab, presence of viral mutation(s) within the areas targeted by this assay, and inadequate number of viral copies(<138 copies/mL). A negative result must be combined with clinical observations, patient history, and epidemiological information. The expected result is Negative.  Fact Sheet for Patients:  BloggerCourse.com  Fact Sheet for Healthcare Providers:  SeriousBroker.it  This test is no t yet approved or cleared by the [place] FDA and  has been authorized for detection  and/or diagnosis of SARS-CoV-2 by FDA under an Emergency Use Authorization (EUA). This EUA will remain  in effect (meaning this test can be used) for the duration of the COVID-19 declaration under Section 564(b)(1) of the Act, 21 U.S.C.section 360bbb-3(b)(1), unless the authorization is terminated  or revoked sooner.       Influenza A by PCR NEGATIVE NEGATIVE Final   Influenza B by PCR NEGATIVE NEGATIVE Final    Comment: (NOTE) The Xpert Xpress SARS-CoV-2/FLU/RSV plus assay is intended as an aid in the diagnosis of influenza from Nasopharyngeal swab specimens and should not be used as a sole basis for treatment. Nasal washings and aspirates are unacceptable for Xpert Xpress SARS-CoV-2/FLU/RSV testing.  Fact Sheet  for Patients: BloggerCourse.com  Fact Sheet for Healthcare Providers: SeriousBroker.it  This test is not yet approved or cleared by the [place] FDA and has been authorized for detection and/or diagnosis of SARS-CoV-2 by FDA under an Emergency Use Authorization (EUA). This EUA will remain in effect (meaning this test can be used) for the duration of the COVID-19 declaration under Section 564(b)(1) of the Act, 21 U.S.C. section 360bbb-3(b)(1), unless the authorization is terminated or revoked.  Performed at [facility], [address] [place], [place] [address]      Labs: BNP (last 3 results) No results for input(s): BNP in the last 8760 hours. Basic Metabolic Panel: Recent Labs  Lab 01/20/20 1051 01/21/20 0614 01/22/20 0525  NA 139 137 141  K 4.8 4.7 3.7  CL 103 105 105  CO2 GLUCOSE 93 139* 143*  BUN 30* 25* 21  CREATININE 1.50* 1.06 0.91  CALCIUM 9.4 9.2 9.1   Liver Function Tests: Recent Labs  Lab 01/22/20 0525  AST 20  ALT 12  ALKPHOS 61  BILITOT 0.4  PROT 6.6  ALBUMIN 3.3*   No results for input(s): LIPASE, AMYLASE in the last 168 hours. No results for input(s):  AMMONIA in the last 168 hours. CBC: Recent Labs  Lab 01/20/20 1051 01/21/20 0614 01/22/20 0525  WBC 4.3 3.9* 8.0  HGB 14.0 12.3* 11.8*  HCT 43.8 38.4* 35.7*  MCV 92.0 91.9 90.6  PLT 200 186 174   Cardiac Enzymes: No results for input(s): CKTOTAL, CKMB, CKMBINDEX, TROPONINI in the last 168 hours. BNP: Invalid input(s): POCBNP CBG: No results for input(s): GLUCAP in the last 168 hours. D-Dimer No results for input(s): DDIMER in the last 72 hours. Hgb A1c No results for input(s): HGBA1C in the last 72 hours. Lipid Profile No results for input(s): CHOL, HDL, LDLCALC, TRIG, CHOLHDL, LDLDIRECT in the last 72 hours. Thyroid function studies No results for input(s): TSH, T4TOTAL, T3FREE, THYROIDAB in the last 72 hours.  Invalid input(s): FREET3 Anemia work up No results for input(s): VITAMINB12, FOLATE, FERRITIN, TIBC, IRON, RETICCTPCT in the last 72 hours. Urinalysis No results found for: COLORURINE, APPEARANCEUR, LABSPEC, PHURINE, GLUCOSEU, HGBUR, BILIRUBINUR, KETONESUR, PROTEINUR, UROBILINOGEN, NITRITE, LEUKOCYTESUR Sepsis Labs Invalid input(s): PROCALCITONIN,  WBC,  LACTICIDVEN Microbiology Recent Results (from the past 240 hour(s))  Resp Panel by RT-PCR (Flu A&B, Covid) Nasopharyngeal Swab     Status: None   Collection Time: 01/20/20  5:37 PM   Specimen: Nasopharyngeal Swab; Nasopharyngeal(NP) swabs in vial transport medium  Result Value Ref Range Status   SARS Coronavirus 2 by RT PCR NEGATIVE NEGATIVE Final    Comment: (NOTE) SARS-CoV-2 target nucleic acids are NOT DETECTED.  The SARS-CoV-2 RNA is generally detectable in upper respiratory specimens during the acute phase of infection. The lowest concentration of SARS-CoV-2 viral copies this assay can detect is 138 copies/mL. A negative result does not preclude SARS-Cov-2 infection and should not be used as the sole basis for treatment or other patient management decisions. A negative result may occur with  improper  specimen collection/handling, submission of specimen other than nasopharyngeal swab, presence of viral mutation(s) within the areas targeted by this assay, and inadequate number of viral copies(<138 copies/mL). A negative result must be combined with clinical observations, patient history, and epidemiological information. The expected result is Negative.  Fact Sheet for Patients:  BloggerCourse.com  Fact Sheet for Healthcare Providers:  SeriousBroker.it  This test is no t yet approved or cleared by the [place]  FDA and  has been authorized for detection and/or diagnosis of SARS-CoV-2 by FDA under an Emergency Use Authorization (EUA). This EUA will remain  in effect (meaning this test can be used) for the duration of the COVID-19 declaration under Section 564(b)(1) of the Act, 21 U.S.C.section 360bbb-3(b)(1), unless the authorization is terminated  or revoked sooner.       Influenza A by PCR NEGATIVE NEGATIVE Final   Influenza B by PCR NEGATIVE NEGATIVE Final    Comment: (NOTE) The Xpert Xpress SARS-CoV-2/FLU/RSV plus assay is intended as an aid in the diagnosis of influenza from Nasopharyngeal swab specimens and should not be used as a sole basis for treatment. Nasal washings and aspirates are unacceptable for Xpert Xpress SARS-CoV-2/FLU/RSV testing.  Fact Sheet for Patients: BloggerCourse.com  Fact Sheet for Healthcare Providers: SeriousBroker.it  This test is not yet approved or cleared by the [place] FDA and has been authorized for detection and/or diagnosis of SARS-CoV-2 by FDA under an Emergency Use Authorization (EUA). This EUA will remain in effect (meaning this test can be used) for the duration of the COVID-19 declaration under Section 564(b)(1) of the Act, 21 U.S.C. section 360bbb-3(b)(1), unless the authorization is terminated or revoked.  Performed at  [facility], [address], [place], [place] [address]      Time coordinating discharge: Over 30 minutes  SIGNED:   Tresa Moore, MD  [facility] 01/24/2020, 4:46 PM Pager   If 7PM-7AM, please contact night-coverage

## 2020-01-24 NOTE — Progress Notes (Signed)
[facility]  Daily Progress Note   Subjective  - 3 Days Post-Op  He states his foot is feeling much better.  He has been ambulating with assistance with minimal difficulty.  Objective Vitals:   01/24/20 0429 01/24/20 0606 01/24/20 0829 01/24/20 1119  BP: 117/60  107/60 (!) 115/59  Pulse: 82  68 74  Resp:   18 18  Temp: 98.5 F (36.9 C)  98.1 F (36.7 C) 98.5 F (36.9 C)  TempSrc: Oral  Oral Oral  SpO2: 98%  99% 95%  Weight:  52.2 kg    Height:        Intake/Output Summary (Last 24 hours) at 01/24/2020 1253 Last data filed at 01/24/2020 1009 Gross per 24 hour  Intake 363 ml  Output 0 ml  Net 363 ml    PULM  Normal effort , no use of accessory muscles CV  No JVD, RRR Abd      No distended, nontender VASC  there is a 2+ left femoral pulse.  His left foot is hyperemic with brisk capillary refill  Laboratory CBC    Component Value Date/Time   WBC 8.0 01/22/2020 0525   HGB 11.8 (L) 01/22/2020 0525   HCT 35.7 (L) 01/22/2020 0525   PLT 174 01/22/2020 0525    BMET    Component Value Date/Time   NA 141 01/22/2020 0525   K 3.7 01/22/2020 0525   CL 105 01/22/2020 0525   CO2 27 01/22/2020 0525   GLUCOSE 143 (H) 01/22/2020 0525   BUN 21 01/22/2020 0525   CREATININE 0.91 01/22/2020 0525   CALCIUM 9.1 01/22/2020 0525   GFRNONAA >60 01/22/2020 0525   GFRAA >60 01/11/2018 0356    Assessment/Planning: 1.Atherosclerotic occlusive disease bilateral lower extremities with rest pain of the left lower extremity: Patient continues to do well.  I have restarted his Eliquis.    I have brought him 2 weeks worth to allow time for him to get the Eliquis from the [place] pharmacy.  He has completed his Aggrastat.  I will continue with Plavix as well.    He will follow-up with me in the office.  2. Contrast allergy significant: The patient's itching seems to have resolved so I do not think he needs continued steroid therapy.    3. COPD: Continue pulmonary  medications and aerosols as already ordered, these medications have been reviewed and there are no changes at this time.  4. Hypertension:Continue antihypertensive medications as already ordered, these medications have been reviewed and there are no changes at this time.  85% of what we do   Levora Dredge  01/24/2020, 12:53 PM

## 2020-01-24 NOTE — Plan of Care (Signed)
Pt in bed. Pt alert and oriented x 4. Pt states that his pain is a 2 out of 10. Pt stated that he did not want to take oxy because it makes his stomach upset. After further educating the pt he agreed to tried the oxy with zofran. Pt stated the zofran helped. Pt complained of indigestion. Pt received 2 tums, an ekg to rule out ACS, and maalox. Pt stated that the maalox made him feel better. Pt currently sleeping. Will continue to monitor.   Problem: Education: Goal: Knowledge of General Education information will improve Description: Including pain rating scale, medication(s)/side effects and non-pharmacologic comfort measures Outcome: Progressing   Problem: Health Behavior/Discharge Planning: Goal: Ability to manage health-related needs will improve Outcome: Progressing   Problem: Clinical Measurements: Goal: Ability to maintain clinical measurements within normal limits will improve Outcome: Progressing Goal: Will remain free from infection Outcome: Progressing Goal: Diagnostic test results will improve Outcome: Progressing Goal: Respiratory complications will improve Outcome: Progressing Goal: Cardiovascular complication will be avoided Outcome: Progressing   Problem: Activity: Goal: Risk for activity intolerance will decrease Outcome: Progressing   Problem: Nutrition: Goal: Adequate nutrition will be maintained Outcome: Progressing   Problem: Coping: Goal: Level of anxiety will decrease Outcome: Progressing   Problem: Elimination: Goal: Will not experience complications related to bowel motility Outcome: Progressing Goal: Will not experience complications related to urinary retention Outcome: Progressing   Problem: Pain Managment: Goal: General experience of comfort will improve Outcome: Progressing   Problem: Safety: Goal: Ability to remain free from injury will improve Outcome: Progressing   Problem: Skin Integrity: Goal: Risk for impaired skin integrity will  decrease Outcome: Progressing

## 2020-01-24 NOTE — Progress Notes (Addendum)
Pt complained of indigestion. Gave pt 2 Tums. Pt stated that the Tums did not work. Secure chatted Ouma. Ouma said to obtain an ekg due to the pt being high risk for ACS. Ouma put in order for Maalox. Gave pt Maalox at 0123.   Update 01/24/2020 0206: Pt states that he feels a lot better after taking the Maalox.

## 2020-01-30 ENCOUNTER — Other Ambulatory Visit (INDEPENDENT_AMBULATORY_CARE_PROVIDER_SITE_OTHER): Payer: Self-pay | Admitting: Nurse Practitioner

## 2020-01-30 MED ORDER — CLOPIDOGREL BISULFATE 75 MG PO TABS: 75.0000 mg | ORAL_TABLET | Freq: Every day | ORAL | 5 refills | Status: DC

## 2020-01-30 MED ORDER — CLOPIDOGREL BISULFATE 75 MG PO TABS: 75.0000 mg | ORAL_TABLET | Freq: Every day | ORAL | 11 refills | Status: AC

## 2020-02-04 ENCOUNTER — Other Ambulatory Visit (INDEPENDENT_AMBULATORY_CARE_PROVIDER_SITE_OTHER): Payer: Self-pay | Admitting: Vascular Surgery

## 2020-02-04 DIAGNOSIS — Z95820 Peripheral vascular angioplasty status with implants and grafts: Secondary | ICD-10-CM

## 2020-02-04 DIAGNOSIS — I70222 Atherosclerosis of native arteries of extremities with rest pain, left leg: Secondary | ICD-10-CM

## 2020-02-10 ENCOUNTER — Encounter (INDEPENDENT_AMBULATORY_CARE_PROVIDER_SITE_OTHER): Payer: Self-pay

## 2020-02-10 ENCOUNTER — Other Ambulatory Visit (INDEPENDENT_AMBULATORY_CARE_PROVIDER_SITE_OTHER): Payer: Self-pay | Admitting: Nurse Practitioner

## 2020-02-10 ENCOUNTER — Ambulatory Visit (INDEPENDENT_AMBULATORY_CARE_PROVIDER_SITE_OTHER): Payer: Self-pay | Admitting: Nurse Practitioner

## 2020-10-26 ENCOUNTER — Emergency Department: Payer: No Typology Code available for payment source

## 2020-10-26 ENCOUNTER — Emergency Department
Admission: EM | Admit: 2020-10-26 | Discharge: 2020-10-26 | Disposition: A | Discharge status: Home / Self Care | Payer: No Typology Code available for payment source | Attending: Emergency Medicine | Admitting: Emergency Medicine

## 2020-10-26 ENCOUNTER — Other Ambulatory Visit: Payer: Self-pay

## 2020-10-26 ENCOUNTER — Encounter: Payer: Self-pay | Admitting: Radiology

## 2020-10-26 DIAGNOSIS — Z7901 Long term (current) use of anticoagulants: Secondary | ICD-10-CM | POA: Diagnosis not present

## 2020-10-26 DIAGNOSIS — J449 Chronic obstructive pulmonary disease, unspecified: Secondary | ICD-10-CM | POA: Diagnosis not present

## 2020-10-26 DIAGNOSIS — F17200 Nicotine dependence, unspecified, uncomplicated: Secondary | ICD-10-CM | POA: Insufficient documentation

## 2020-10-26 DIAGNOSIS — Z79899 Other long term (current) drug therapy: Secondary | ICD-10-CM | POA: Insufficient documentation

## 2020-10-26 DIAGNOSIS — R0602 Shortness of breath: Secondary | ICD-10-CM | POA: Diagnosis not present

## 2020-10-26 DIAGNOSIS — I110 Hypertensive heart disease with heart failure: Secondary | ICD-10-CM | POA: Insufficient documentation

## 2020-10-26 DIAGNOSIS — Z7951 Long term (current) use of inhaled steroids: Secondary | ICD-10-CM | POA: Insufficient documentation

## 2020-10-26 DIAGNOSIS — L03116 Cellulitis of left lower limb: Secondary | ICD-10-CM

## 2020-10-26 DIAGNOSIS — M79652 Pain in left thigh: Secondary | ICD-10-CM | POA: Diagnosis present

## 2020-10-26 DIAGNOSIS — Z7902 Long term (current) use of antithrombotics/antiplatelets: Secondary | ICD-10-CM | POA: Insufficient documentation

## 2020-10-26 DIAGNOSIS — Z20822 Contact with and (suspected) exposure to covid-19: Secondary | ICD-10-CM | POA: Insufficient documentation

## 2020-10-26 DIAGNOSIS — I5033 Acute on chronic diastolic (congestive) heart failure: Secondary | ICD-10-CM | POA: Insufficient documentation

## 2020-10-26 LAB — BASIC METABOLIC PANEL
Anion gap: 8 (ref 5–15)
BUN: 18 mg/dL (ref 8–23)
CO2: 26 mmol/L (ref 22–32)
Calcium: 8.6 mg/dL — ABNORMAL LOW (ref 8.9–10.3)
Chloride: 103 mmol/L (ref 98–111)
Creatinine, Ser: 1.06 mg/dL (ref 0.61–1.24)
GFR, Estimated: >60 mL/min (ref >60)
Glucose, Bld: 85 mg/dL (ref 70–99)
Potassium: 3.5 mmol/L (ref 3.5–5.1)
Sodium: 137 mmol/L (ref 135–145)

## 2020-10-26 LAB — CBC
HCT: 38.8 % — ABNORMAL LOW (ref 39.0–52.0)
Hemoglobin: 12.7 g/dL — ABNORMAL LOW (ref 13.0–17.0)
MCH: 30.7 pg (ref 26.0–34.0)
MCHC: 32.7 g/dL (ref 30.0–36.0)
MCV: 93.7 fL (ref 80.0–100.0)
Platelets: 184 10*3/uL (ref 150–400)
RBC: 4.14 MIL/uL — ABNORMAL LOW (ref 4.22–5.81)
RDW: 12.3 % (ref 11.5–15.5)
WBC: 5.7 10*3/uL (ref 4.0–10.5)
nRBC: 0 % (ref 0.0–0.2)

## 2020-10-26 LAB — RESP PANEL BY RT-PCR (FLU A&B, COVID) ARPGX2
Influenza A by PCR: NEGATIVE
Influenza B by PCR: NEGATIVE
SARS Coronavirus 2 by RT PCR: NEGATIVE

## 2020-10-26 LAB — TROPONIN I (HIGH SENSITIVITY)
Troponin I (High Sensitivity): 15 ng/L (ref <18)
Troponin I (High Sensitivity): 15 ng/L (ref <18)

## 2020-10-26 LAB — PROTIME-INR
INR: 1.2 (ref 0.8–1.2)
Prothrombin Time: 14.7 seconds (ref 11.4–15.2)

## 2020-10-26 MED ORDER — HYDROCODONE-ACETAMINOPHEN 5-325 MG PO TABS
1.0000 | ORAL_TABLET | Freq: Once | ORAL | Status: AC
  Administered 2020-10-26: 1 via ORAL
  Filled 2020-10-26: qty 1

## 2020-10-26 MED ORDER — DOXYCYCLINE HYCLATE 100 MG PO TABS
100.0000 mg | ORAL_TABLET | Freq: Once | ORAL | Status: AC
  Administered 2020-10-26: 100 mg via ORAL
  Filled 2020-10-26: qty 1

## 2020-10-26 MED ORDER — IOHEXOL 350 MG/ML SOLN
75.0000 mL | Freq: Once | INTRAVENOUS | Status: AC | PRN
  Administered 2020-10-26: 75 mL via INTRAVENOUS
  Filled 2020-10-26: qty 75

## 2020-10-26 MED ORDER — DOXYCYCLINE HYCLATE 50 MG PO CAPS: 100.0000 mg | ORAL_CAPSULE | Freq: Two times a day (BID) | ORAL | 0 refills | Status: AC

## 2020-10-26 MED ORDER — DIPHENHYDRAMINE HCL 25 MG PO CAPS: 50.0000 mg | ORAL_CAPSULE | Freq: Once | ORAL | Status: AC

## 2020-10-26 MED ORDER — HYDROCORTISONE SOD SUC (PF) 250 MG IJ SOLR
200.0000 mg | Freq: Once | INTRAMUSCULAR | Status: AC
  Administered 2020-10-26: 200 mg via INTRAVENOUS
  Filled 2020-10-26: qty 200

## 2020-10-26 MED ORDER — DIPHENHYDRAMINE HCL 50 MG/ML IJ SOLN
50.0000 mg | Freq: Once | INTRAMUSCULAR | Status: AC
  Administered 2020-10-26: 50 mg via INTRAVENOUS
  Filled 2020-10-26: qty 1

## 2020-10-26 NOTE — ED Provider Notes (Signed)
[facility] Provider Note   ____________________________________________   Event Date/Time   First MD Initiated Contact with Patient 10/26/20 1415     (approximate)  I have reviewed the triage vital signs and the nursing notes.   HISTORY  Chief Complaint Shortness of Breath    HPI [facility]. is a 60 y.o. male who presents for left thigh redness and pain  LOCATION: Left upper anterior thigh DURATION: 1 week prior to arrival TIMING: Worsening since onset SEVERITY: 10/10 QUALITY: Aching/burning CONTEXT: Patient states that he began noticing redness to this area approximately 1 week prior to arrival and has had increased pain with movement or palpation of this area MODIFYING FACTORS: Worsened with any movement or palpation and slightly relieved at rest ASSOCIATED SYMPTOMS: Shortness of breath   Per medical record review, patient has history of DVT currently on anticoagulation and denies any missed doses          Past Medical History:  Diagnosis Date   DVT (deep venous thrombosis) (HCC)    Hypertension     Patient Active Problem List   Diagnosis Date Noted   Protein-calorie malnutrition, severe 01/22/2020   Nontraumatic ischemic infarction of muscle, left lower leg 01/20/2020   Dehydration 01/20/2020   Arterial occlusion, lower extremity (HCC) 01/20/2020   Hypertension    Nicotine dependence    COPD (chronic obstructive pulmonary disease) with emphysema (HCC)    PAD (peripheral artery disease) (HCC)    Acute hypoxemic respiratory failure (HCC) 01/08/2018   Acute on chronic diastolic CHF (congestive heart failure) (HCC) 06/21/2016   Acute right-sided heart failure (HCC) 06/21/2016    Past Surgical History:  Procedure Laterality Date   KNEE ARTHROSCOPY Left    LOWER EXTREMITY ANGIOGRAPHY Left 01/21/2020   Procedure: Lower Extremity Angiography;  Surgeon: Renford Dills, MD;  Location: [facility] INVASIVE CV  LAB;  Service: Cardiovascular;  Laterality: Left;   ORIF FEMUR FRACTURE Right     Prior to Admission medications   Medication Sig Start Date End Date Taking? Authorizing Provider  clopidogrel (PLAVIX) 75 MG tablet Take 1 tablet (75 mg total) by mouth daily. 01/30/20 01/29/21  Georgiana Spinner, NP  doxycycline (VIBRAMYCIN) 50 MG capsule Take 2 capsules (100 mg total) by mouth 2 (two) times daily for 5 days. 10/26/20 10/31/20 Yes Merwyn Katos, MD  acetaminophen (TYLENOL) 325 MG tablet Take 650 mg by mouth 3 (three) times daily as needed for mild pain or fever.    [provider]  albuterol (PROVENTIL HFA;VENTOLIN HFA) 108 (90 Base) MCG/ACT inhaler Inhale 2 puffs into the lungs every 6 (six) hours as needed for wheezing or shortness of breath.    [provider]  apixaban (ELIQUIS) 5 MG TABS tablet Take 1 tablet (5 mg total) by mouth 2 (two) times daily for 7 days. 01/24/20 01/31/20  Tresa Moore, MD  atorvastatin (LIPITOR) 20 MG tablet Take 20 mg by mouth at bedtime.    [provider]  budesonide-formoterol (SYMBICORT) 160-4.5 MCG/ACT inhaler Inhale 2 puffs into the lungs 2 (two) times daily. 06/23/16   Enid Baas, MD  carboxymethylcellulose (REFRESH PLUS) 0.5 % SOLN Place 1 drop into both eyes 4 (four) times daily as needed.    [provider]  carvedilol (COREG) 25 MG tablet Take 1 tablet (25 mg total) by mouth 2 (two) times daily with a meal. 01/12/18   Shaune Pollack, MD  cholecalciferol (VITAMIN D) 1000 units tablet Take 2,000 Units  by mouth daily.    [provider]  famotidine (PEPCID) 20 MG tablet Take 20 mg by mouth 2 (two) times daily as needed for heartburn or indigestion.    [provider]  gabapentin (NEURONTIN) 400 MG capsule Take 1 capsule (400 mg total) by mouth 3 (three) times daily for 20 days. 01/24/20 02/13/20  Tresa Moore, MD  gabapentin (NEURONTIN) 800 MG tablet Take 800 mg by mouth 3 (three) times daily.      [provider]  ipratropium-albuterol (DUONEB) 0.5-2.5 (3) MG/3ML SOLN Take 3 mLs by nebulization every 6 (six) hours as needed. Patient not taking: No sig reported 06/23/16   Enid Baas, MD  lisinopril (PRINIVIL,ZESTRIL) 10 MG tablet Take 20 mg by mouth daily.     [provider]  loratadine (CLARITIN) 10 MG tablet Take 10 mg by mouth daily as needed for allergies.    [provider]  nicotine (NICODERM CQ - DOSED IN MG/24 HOURS) 21 mg/24hr patch Place 1 patch (21 mg total) onto the skin daily. 06/24/16   Enid Baas, MD  ondansetron (ZOFRAN ODT) 4 MG disintegrating tablet Take 1 tablet (4 mg total) by mouth every 8 (eight) hours as needed for nausea or vomiting. 01/24/20   Tresa Moore, MD  sennosides-docusate sodium (SENOKOT-S) 8.6-50 MG tablet Take 1-2 tablets by mouth at bedtime as needed for constipation.    [provider]  sildenafil (VIAGRA) 100 MG tablet Take 100 mg by mouth daily as needed for erectile dysfunction.    [provider]  tiotropium (SPIRIVA) 18 MCG inhalation capsule Place 18 mcg into inhaler and inhale daily.    [provider]  traZODone (DESYREL) 50 MG tablet Take 25 mg by mouth at bedtime.    [provider]    Allergies Iodine and Ivp dye [iodinated diagnostic agents]  Family History  Problem Relation Age of Onset   CAD Mother    Emphysema Father     Social History Social History   Tobacco Use   Smoking status: Some Days   Smokeless tobacco: Never  Substance Use Topics   Alcohol use: Yes   Drug use: No    Review of Systems Constitutional: No fever/chills Eyes: No visual changes. ENT: No sore throat. Cardiovascular: Denies chest pain. Respiratory: Endorses shortness of breath. Gastrointestinal: No abdominal pain.  No nausea, no vomiting.  No diarrhea. Genitourinary: Negative for dysuria. Musculoskeletal: Endorses left thigh pain Skin: Negative for  rash. Neurological: Negative for headaches, weakness/numbness/paresthesias in any extremity Psychiatric: Negative for suicidal ideation/homicidal ideation   ____________________________________________   PHYSICAL EXAM:  VITAL SIGNS: ED Triage Vitals  Enc Vitals Group     BP 10/26/20 1126 121/62     Pulse Rate 10/26/20 1126 84     Resp 10/26/20 1512 20     Temp 10/26/20 1126 98.7 F (37.1 C)     Temp Source 10/26/20 1126 Oral     SpO2 10/26/20 1126 98 %     Weight 10/26/20 1127 120 lb (54.4 kg)     Height 10/26/20 1127 5\' 8"  (1.727 m)     Head Circumference --      Peak Flow --      Pain Score 10/26/20 1127 9     Pain Loc --      Pain Edu? --      Excl. in GC? --    Constitutional: Alert and oriented. Well appearing and in no acute distress. Eyes: Conjunctivae are normal. PERRL. Head:  Atraumatic. Nose: No congestion/rhinnorhea. Mouth/Throat: Mucous membranes are moist. Neck: No stridor Cardiovascular: Grossly normal heart sounds.  Good peripheral circulation. Respiratory: Normal respiratory effort.  No retractions. Gastrointestinal: Soft and nontender. No distention. Musculoskeletal: No obvious deformities Neurologic:  Normal speech and language. No gross focal neurologic deficits are appreciated. Skin:  Skin is warm and dry.  Erythema noted to the proximal aspect of the inner dorsal left thigh that is tender to touch and without any underlying fluctuance Psychiatric: Mood and affect are normal. Speech and behavior are normal.  ____________________________________________   LABS (all labs ordered are listed, but only abnormal results are displayed)  Labs Reviewed  BASIC METABOLIC PANEL - Abnormal; Notable for the following components:      Result Value   Calcium 8.6 (*)    All other components within normal limits  CBC - Abnormal; Notable for the following components:   RBC 4.14 (*)    Hemoglobin 12.7 (*)    HCT 38.8 (*)    All other components within normal  limits  RESP PANEL BY RT-PCR (FLU A&B, COVID) [id number]  PROTIME-INR  TROPONIN I (HIGH SENSITIVITY)  TROPONIN I (HIGH SENSITIVITY)   ____________________________________________  EKG  ED ECG REPORT I, Merwyn Katos, the attending physician, personally viewed and interpreted this ECG.  Date: 10/26/2020 EKG Time: 1133 Rate: 79 Rhythm: normal sinus rhythm QRS Axis: normal Intervals: normal ST/T Wave abnormalities: normal Narrative Interpretation: no evidence of acute ischemia  ____________________________________________  RADIOLOGY  ED MD interpretation: 2 view chest x-ray shows no evidence of acute abnormalities including no pneumonia, pneumothorax, or widened mediastinum  CT angiography of the chest shows no evidence of pulmonary embolism however does show mild sequelae of remote pulmonary embolism as well as left ventricular hypertrophy and moderate centrilobular emphysema  Official radiology report(s): DG Chest 2 View  Result Date: 10/26/2020 CLINICAL DATA:  Chest pain and shortness of breath. EXAM: CHEST - 2 VIEW COMPARISON:  01/08/2018 FINDINGS: The heart size and mediastinal contours are within normal limits. Aortic atherosclerosis. Both lungs are clear. The visualized skeletal structures are unremarkable. IMPRESSION: No active cardiopulmonary disease. Electronically Signed   By: Signa Kell M.D.   On: 10/26/2020 13:12   CT Angio Chest PE W/Cm &/Or Wo Cm  Result Date: 10/26/2020 CLINICAL DATA:  PE suspected, high prob. Dyspnea. History of DVT on chronic anticoagulation. EXAM: CT ANGIOGRAPHY CHEST WITH CONTRAST TECHNIQUE: Multidetector CT imaging of the chest was performed using the standard protocol during bolus administration of intravenous contrast. Multiplanar CT image reconstructions and MIPs were obtained to evaluate the vascular anatomy. CONTRAST:  17mL OMNIPAQUE IOHEXOL 350 MG/ML SOLN COMPARISON:  06/04/2005 FINDINGS: Cardiovascular: There is adequate opacification of  the pulmonary arterial tree. No intraluminal filling defects identified to suggest acute pulmonary embolism. There are synechia identified within the segmental pulmonary arteries of the lower lobes bilaterally in keeping with the sequela of remote pulmonary embolism. The central pulmonary arteries are of normal caliber. Cardiac size is within normal limits though left ventricular hypertrophy is noted. No pericardial effusion. Mild atherosclerotic calcification within the thoracic aorta. No aortic aneurysm. Mediastinum/Nodes: No enlarged mediastinal, hilar, or axillary lymph nodes. Thyroid gland, trachea, and esophagus demonstrate no significant findings. Lungs/Pleura: Moderate centrilobular emphysema. Bibasilar mild parenchymal scarring noted. No focal pulmonary nodules or infiltrates. No pneumothorax or pleural effusion. Central airways are widely patent. Upper Abdomen: No acute abnormality. Musculoskeletal: No acute bone abnormality. Review of the MIP images confirms the above findings. IMPRESSION: No acute pulmonary embolism. Mild  sequela of remote pulmonary embolism noted. Left ventricular hypertrophy. Moderate centrilobular emphysema. Aortic Atherosclerosis (ICD10-I70.0) and Emphysema (ICD10-J43.9). Electronically Signed   By: Helyn Numbers M.D.   On: 10/26/2020 19:10    ____________________________________________   PROCEDURES  Procedure(s) performed (including Critical Care):  Procedures   ____________________________________________   INITIAL IMPRESSION / ASSESSMENT AND PLAN / ED COURSE  As part of my medical decision making, I reviewed the following data within the electronic medical record, if available:  Nursing notes reviewed and incorporated, Labs reviewed, EKG interpreted, Old chart reviewed, Radiograph reviewed and Notes from prior ED visits reviewed and incorporated        Presentation most consistent with simple cellulitis. Given History, Exam, and Workup I have low suspicion  for Necrotizing Fasciitis, Abscess, Osteomyelitis, DVT or other emergent problem as a cause for this presentation.  Rx: Doxycycline 100 mg twice daily x5 days CT angiography of the chest did not show any evidence of new pulmonary emboli and patient is already anticoagulated and therefore if pain worsens was not directed to follow-up at his primary care physician's office for an ultrasound of his lower extremity Disposition: Discharge. No evidence of serious bacterial illness. Nontoxic appearing, VSS. Low risk for treatment failure based on history. Strict return precautions discussed with patient with full understanding. Advised patient to follow up promptly with primary care provider within next 48 hours.      ____________________________________________   FINAL CLINICAL IMPRESSION(S) / ED DIAGNOSES  Final diagnoses:  Cellulitis of left lower leg     ED Discharge Orders          Ordered    doxycycline (VIBRAMYCIN) 50 MG capsule  2 times daily        10/26/20 1934             Note:  This document was prepared using Dragon voice recognition software and may include unintentional dictation errors.    Merwyn Katos, MD 10/26/20 2115

## 2020-10-26 NOTE — ED Provider Notes (Signed)
Emergency Medicine Provider Triage Evaluation Note  [facility]opher [facility]., a 60 y.o. male  was evaluated in triage.  Pt complains of LLE swelling at the thigh since last week. He has a history of DVT/PE s/p iliac stents and current anticoagulant therapy. He is concerned about a possible blood clot in the leg. He notes some SOB with onset Saturday.   Review of Systems  Positive: LLE swelling, SOB Negative: CP  Physical Exam  BP 121/62 (BP Location: Left Arm)   Pulse 84   Temp 98.7 F (37.1 C) (Oral)   Ht 5\' 8"  (1.727 m)   Wt 54.4 kg   SpO2 98%   BMI 18.25 kg/m  Gen:   Awake, no distress  NAD Resp:  Normal effort CATA MSK:   Moves extremities without difficulty  Other:  CVS: RRR  Medical Decision Making  Medically screening exam initiated at 11:53 AM.  Appropriate orders placed.  . was informed that the remainder of the evaluation will be completed by another provider, this initial triage assessment does not replace that evaluation, and the importance of remaining in the ED until their evaluation is complete.  Patient with ED evaluation of LLE swelling and concern for DVT.   [facility], PA-C 10/26/20 1159    10/28/20, MD 10/26/20 [phone]

## 2020-10-26 NOTE — ED Triage Notes (Signed)
Pt reports that he is having some SHOB that began on Friday, he can speak in complete sentences. He has history of blood clots for several years and is on blood thinners. He feels that he may have another clot because it feels similar.

## 2021-09-06 DIAGNOSIS — Z91041 Radiographic dye allergy status: Secondary | ICD-10-CM

## 2021-09-06 DIAGNOSIS — Z20822 Contact with and (suspected) exposure to covid-19: Secondary | ICD-10-CM | POA: Diagnosis present

## 2021-09-06 DIAGNOSIS — F17210 Nicotine dependence, cigarettes, uncomplicated: Secondary | ICD-10-CM | POA: Diagnosis present

## 2021-09-06 DIAGNOSIS — Z825 Family history of asthma and other chronic lower respiratory diseases: Secondary | ICD-10-CM

## 2021-09-06 DIAGNOSIS — E875 Hyperkalemia: Secondary | ICD-10-CM | POA: Diagnosis not present

## 2021-09-06 DIAGNOSIS — I70321 Atherosclerosis of unspecified type of bypass graft(s) of the extremities with rest pain, right leg: Principal | ICD-10-CM | POA: Diagnosis present

## 2021-09-06 DIAGNOSIS — Z91199 Patient's noncompliance with other medical treatment and regimen due to unspecified reason: Secondary | ICD-10-CM

## 2021-09-06 DIAGNOSIS — Z91148 Patient's other noncompliance with medication regimen for other reason: Secondary | ICD-10-CM

## 2021-09-06 DIAGNOSIS — Z7951 Long term (current) use of inhaled steroids: Secondary | ICD-10-CM

## 2021-09-06 DIAGNOSIS — I5032 Chronic diastolic (congestive) heart failure: Secondary | ICD-10-CM | POA: Diagnosis present

## 2021-09-06 DIAGNOSIS — I998 Other disorder of circulatory system: Secondary | ICD-10-CM | POA: Diagnosis not present

## 2021-09-06 DIAGNOSIS — Z7901 Long term (current) use of anticoagulants: Secondary | ICD-10-CM

## 2021-09-06 DIAGNOSIS — Z79899 Other long term (current) drug therapy: Secondary | ICD-10-CM

## 2021-09-06 DIAGNOSIS — Z8249 Family history of ischemic heart disease and other diseases of the circulatory system: Secondary | ICD-10-CM

## 2021-09-06 DIAGNOSIS — J439 Emphysema, unspecified: Secondary | ICD-10-CM | POA: Diagnosis present

## 2021-09-06 DIAGNOSIS — Z86718 Personal history of other venous thrombosis and embolism: Secondary | ICD-10-CM

## 2021-09-06 DIAGNOSIS — N179 Acute kidney failure, unspecified: Secondary | ICD-10-CM | POA: Diagnosis present

## 2021-09-06 DIAGNOSIS — I110 Hypertensive heart disease with heart failure: Secondary | ICD-10-CM | POA: Diagnosis present

## 2021-09-06 LAB — BASIC METABOLIC PANEL
Anion gap: 7 (ref 5–15)
BUN: 16 mg/dL (ref 8–23)
CO2: 27 mmol/L (ref 22–32)
Calcium: 9.4 mg/dL (ref 8.9–10.3)
Chloride: 107 mmol/L (ref 98–111)
Creatinine, Ser: 1.42 mg/dL — ABNORMAL HIGH (ref 0.61–1.24)
GFR, Estimated: 56 mL/min — ABNORMAL LOW (ref >60)
Glucose, Bld: 136 mg/dL — ABNORMAL HIGH (ref 70–99)
Potassium: 4.4 mmol/L (ref 3.5–5.1)
Sodium: 141 mmol/L (ref 135–145)

## 2021-09-06 LAB — CBC
HCT: 38.1 % — ABNORMAL LOW (ref 39.0–52.0)
Hemoglobin: 12.1 g/dL — ABNORMAL LOW (ref 13.0–17.0)
MCH: 28.8 pg (ref 26.0–34.0)
MCHC: 31.8 g/dL (ref 30.0–36.0)
MCV: 90.7 fL (ref 80.0–100.0)
Platelets: 205 10*3/uL (ref 150–400)
RBC: 4.2 MIL/uL — ABNORMAL LOW (ref 4.22–5.81)
RDW: 12.8 % (ref 11.5–15.5)
WBC: 5.1 10*3/uL (ref 4.0–10.5)
nRBC: 0 % (ref 0.0–0.2)

## 2021-09-06 MED ORDER — DIPHENHYDRAMINE HCL 50 MG/ML IJ SOLN: 50.0000 mg | Freq: Once | INTRAMUSCULAR | Status: AC

## 2021-09-06 MED ORDER — DIPHENHYDRAMINE HCL 25 MG PO CAPS: 50.0000 mg | ORAL_CAPSULE | Freq: Once | ORAL | Status: AC

## 2021-09-06 MED ORDER — METHYLPREDNISOLONE SODIUM SUCC 40 MG IJ SOLR
40.0000 mg | Freq: Once | INTRAMUSCULAR | Status: AC
  Administered 2021-09-07: 40 mg via INTRAVENOUS
  Filled 2021-09-06: qty 1

## 2021-09-06 NOTE — ED Triage Notes (Signed)
Pt presents via POV with complaints of left leg pain and swelling. Pt seen at the [place] and told he had a DVT but was misinformed. Of note, the patient has not been compliant with his medications for the last 9 days. Denies CP or SOB.   Pt discussed with EDP (Ward) recommended that this RN obtain pedal pulses.

## 2021-09-06 NOTE — ED Notes (Signed)
Doppler used on the pts lower extremities. Pedal pulse marked on the right leg; this RN & Eliberto Ivory, RN attempted with doppler to obtain pedal pulses on the left without success. EDP notified.

## 2021-09-07 ENCOUNTER — Inpatient Hospital Stay
Admission: EM | Admit: 2021-09-07 | Discharge: 2021-09-10 | DRG: 271 | Disposition: A | Discharge status: Home / Self Care | Payer: No Typology Code available for payment source | Attending: Hospitalist | Admitting: Hospitalist

## 2021-09-07 ENCOUNTER — Other Ambulatory Visit: Payer: Self-pay

## 2021-09-07 DIAGNOSIS — I998 Other disorder of circulatory system: Principal | ICD-10-CM

## 2021-09-07 DIAGNOSIS — Z8249 Family history of ischemic heart disease and other diseases of the circulatory system: Secondary | ICD-10-CM | POA: Diagnosis not present

## 2021-09-07 DIAGNOSIS — Z7951 Long term (current) use of inhaled steroids: Secondary | ICD-10-CM | POA: Diagnosis not present

## 2021-09-07 DIAGNOSIS — N179 Acute kidney failure, unspecified: Secondary | ICD-10-CM | POA: Diagnosis present

## 2021-09-07 DIAGNOSIS — I70229 Atherosclerosis of native arteries of extremities with rest pain, unspecified extremity: Secondary | ICD-10-CM | POA: Diagnosis present

## 2021-09-07 DIAGNOSIS — F17200 Nicotine dependence, unspecified, uncomplicated: Secondary | ICD-10-CM | POA: Diagnosis not present

## 2021-09-07 DIAGNOSIS — I739 Peripheral vascular disease, unspecified: Secondary | ICD-10-CM | POA: Diagnosis present

## 2021-09-07 DIAGNOSIS — Z91041 Radiographic dye allergy status: Secondary | ICD-10-CM | POA: Diagnosis not present

## 2021-09-07 DIAGNOSIS — Z825 Family history of asthma and other chronic lower respiratory diseases: Secondary | ICD-10-CM | POA: Diagnosis not present

## 2021-09-07 DIAGNOSIS — I10 Essential (primary) hypertension: Secondary | ICD-10-CM | POA: Diagnosis not present

## 2021-09-07 DIAGNOSIS — B1920 Unspecified viral hepatitis C without hepatic coma: Secondary | ICD-10-CM | POA: Diagnosis not present

## 2021-09-07 DIAGNOSIS — I70321 Atherosclerosis of unspecified type of bypass graft(s) of the extremities with rest pain, right leg: Secondary | ICD-10-CM | POA: Diagnosis present

## 2021-09-07 DIAGNOSIS — J439 Emphysema, unspecified: Secondary | ICD-10-CM | POA: Diagnosis present

## 2021-09-07 DIAGNOSIS — Z20822 Contact with and (suspected) exposure to covid-19: Secondary | ICD-10-CM | POA: Diagnosis present

## 2021-09-07 DIAGNOSIS — Z91148 Patient's other noncompliance with medication regimen for other reason: Secondary | ICD-10-CM | POA: Diagnosis not present

## 2021-09-07 DIAGNOSIS — I5032 Chronic diastolic (congestive) heart failure: Secondary | ICD-10-CM | POA: Diagnosis present

## 2021-09-07 DIAGNOSIS — E875 Hyperkalemia: Secondary | ICD-10-CM

## 2021-09-07 DIAGNOSIS — I70222 Atherosclerosis of native arteries of extremities with rest pain, left leg: Secondary | ICD-10-CM

## 2021-09-07 DIAGNOSIS — Z7901 Long term (current) use of anticoagulants: Secondary | ICD-10-CM | POA: Diagnosis not present

## 2021-09-07 DIAGNOSIS — I110 Hypertensive heart disease with heart failure: Secondary | ICD-10-CM | POA: Diagnosis present

## 2021-09-07 DIAGNOSIS — Z91199 Patient's noncompliance with other medical treatment and regimen due to unspecified reason: Secondary | ICD-10-CM | POA: Diagnosis not present

## 2021-09-07 DIAGNOSIS — Z86718 Personal history of other venous thrombosis and embolism: Secondary | ICD-10-CM | POA: Diagnosis not present

## 2021-09-07 DIAGNOSIS — Z79899 Other long term (current) drug therapy: Secondary | ICD-10-CM | POA: Diagnosis not present

## 2021-09-07 DIAGNOSIS — F17210 Nicotine dependence, cigarettes, uncomplicated: Secondary | ICD-10-CM | POA: Diagnosis present

## 2021-09-07 HISTORY — DX: Peripheral vascular disease, unspecified: I73.9

## 2021-09-07 HISTORY — DX: Chronic obstructive pulmonary disease, unspecified: J44.9

## 2021-09-07 LAB — HEPARIN LEVEL (UNFRACTIONATED)
Heparin Unfractionated: <0.1 IU/mL — ABNORMAL LOW (ref 0.30–0.70)
Heparin Unfractionated: 0.29 IU/mL — ABNORMAL LOW (ref 0.30–0.70)
Heparin Unfractionated: 0.59 IU/mL (ref 0.30–0.70)
Heparin Unfractionated: 0.58 IU/mL (ref 0.30–0.70)

## 2021-09-07 LAB — PROTIME-INR
INR: 1 (ref 0.8–1.2)
Prothrombin Time: 13.5 seconds (ref 11.4–15.2)

## 2021-09-07 LAB — SARS CORONAVIRUS 2 BY RT PCR: SARS Coronavirus 2 by RT PCR: NEGATIVE

## 2021-09-07 LAB — HIV ANTIBODY (ROUTINE TESTING W REFLEX): HIV Screen 4th Generation wRfx: NONREACTIVE

## 2021-09-07 MED ORDER — MOMETASONE FURO-FORMOTEROL FUM 200-5 MCG/ACT IN AERO
2.0000 | INHALATION_SPRAY | Freq: Two times a day (BID) | RESPIRATORY_TRACT | Status: DC
  Administered 2021-09-07 – 2021-09-10 (×6): 2 via RESPIRATORY_TRACT
  Filled 2021-09-07 (×3): qty 8.8

## 2021-09-07 MED ORDER — CARVEDILOL 25 MG PO TABS: 25.0000 mg | ORAL_TABLET | Freq: Two times a day (BID) | ORAL | Status: DC

## 2021-09-07 MED ORDER — DIPHENHYDRAMINE HCL 50 MG/ML IJ SOLN
INTRAMUSCULAR | Status: AC
  Administered 2021-09-07: 50 mg via INTRAVENOUS
  Filled 2021-09-07: qty 1

## 2021-09-07 MED ORDER — HYDRALAZINE HCL 20 MG/ML IJ SOLN
5.0000 mg | Freq: Four times a day (QID) | INTRAMUSCULAR | Status: DC | PRN
  Administered 2021-09-07 – 2021-09-08 (×2): 5 mg via INTRAVENOUS
  Filled 2021-09-07 (×2): qty 1

## 2021-09-07 MED ORDER — PREDNISONE 20 MG PO TABS
50.0000 mg | ORAL_TABLET | Freq: Four times a day (QID) | ORAL | Status: AC
  Administered 2021-09-07 – 2021-09-08 (×2): 50 mg via ORAL
  Filled 2021-09-07 (×2): qty 1

## 2021-09-07 MED ORDER — HEPARIN (PORCINE) 25000 UT/250ML-% IV SOLN
1100.0000 [IU]/h | INTRAVENOUS | Status: AC
  Administered 2021-09-07: 1100 [IU]/h via INTRAVENOUS
  Administered 2021-09-07: 900 [IU]/h via INTRAVENOUS
  Administered 2021-09-08: 1100 [IU]/h via INTRAVENOUS
  Filled 2021-09-07 (×3): qty 250

## 2021-09-07 MED ORDER — ATORVASTATIN CALCIUM 20 MG PO TABS
80.0000 mg | ORAL_TABLET | Freq: Every day | ORAL | Status: DC
  Administered 2021-09-07 – 2021-09-09 (×3): 80 mg via ORAL
  Filled 2021-09-07 (×3): qty 4

## 2021-09-07 MED ORDER — ACETAMINOPHEN 500 MG PO TABS
1000.0000 mg | ORAL_TABLET | Freq: Once | ORAL | Status: AC
  Administered 2021-09-07: 1000 mg via ORAL
  Filled 2021-09-07: qty 2

## 2021-09-07 MED ORDER — ALBUTEROL SULFATE (2.5 MG/3ML) 0.083% IN NEBU: 2.5000 mg | INHALATION_SOLUTION | Freq: Four times a day (QID) | RESPIRATORY_TRACT | Status: DC | PRN

## 2021-09-07 MED ORDER — SODIUM CHLORIDE 0.9 % IV BOLUS (SEPSIS)
1000.0000 mL | Freq: Once | INTRAVENOUS | Status: AC
  Administered 2021-09-07: 1000 mL via INTRAVENOUS

## 2021-09-07 MED ORDER — MORPHINE SULFATE (PF) 2 MG/ML IV SOLN
2.0000 mg | INTRAVENOUS | Status: DC | PRN
  Administered 2021-09-07 – 2021-09-08 (×8): 2 mg via INTRAVENOUS
  Filled 2021-09-07 (×8): qty 1

## 2021-09-07 MED ORDER — DIPHENHYDRAMINE HCL 25 MG PO CAPS: 50.0000 mg | ORAL_CAPSULE | Freq: Once | ORAL | Status: DC

## 2021-09-07 MED ORDER — ONDANSETRON HCL 4 MG PO TABS: 4.0000 mg | ORAL_TABLET | Freq: Four times a day (QID) | ORAL | Status: DC | PRN

## 2021-09-07 MED ORDER — LISINOPRIL 20 MG PO TABS
20.0000 mg | ORAL_TABLET | Freq: Every day | ORAL | Status: DC
  Administered 2021-09-07: 20 mg via ORAL
  Filled 2021-09-07: qty 2

## 2021-09-07 MED ORDER — DIPHENHYDRAMINE HCL 50 MG/ML IJ SOLN: 50.0000 mg | Freq: Once | INTRAMUSCULAR | Status: DC

## 2021-09-07 MED ORDER — ENSURE ENLIVE PO LIQD
237.0000 mL | Freq: Two times a day (BID) | ORAL | Status: DC
  Administered 2021-09-07 – 2021-09-09 (×2): 237 mL via ORAL

## 2021-09-07 MED ORDER — SODIUM CHLORIDE 0.9 % IV SOLN: INTRAVENOUS | Status: DC

## 2021-09-07 MED ORDER — ACETAMINOPHEN 650 MG RE SUPP: 650.0000 mg | Freq: Four times a day (QID) | RECTAL | Status: DC | PRN

## 2021-09-07 MED ORDER — HYDROMORPHONE HCL 1 MG/ML IJ SOLN
1.0000 mg | Freq: Once | INTRAMUSCULAR | Status: AC
  Administered 2021-09-07: 1 mg via INTRAVENOUS
  Filled 2021-09-07: qty 1

## 2021-09-07 MED ORDER — ACETAMINOPHEN 325 MG PO TABS
650.0000 mg | ORAL_TABLET | Freq: Four times a day (QID) | ORAL | Status: DC | PRN
  Administered 2021-09-07: 650 mg via ORAL
  Filled 2021-09-07 (×2): qty 2

## 2021-09-07 MED ORDER — CARVEDILOL 6.25 MG PO TABS
6.2500 mg | ORAL_TABLET | Freq: Two times a day (BID) | ORAL | Status: DC
  Administered 2021-09-07 – 2021-09-10 (×7): 6.25 mg via ORAL
  Filled 2021-09-07 (×7): qty 1

## 2021-09-07 MED ORDER — HEPARIN BOLUS VIA INFUSION
3300.0000 [IU] | Freq: Once | INTRAVENOUS | Status: AC
  Administered 2021-09-07: 3300 [IU] via INTRAVENOUS
  Filled 2021-09-07: qty 3300

## 2021-09-07 MED ORDER — TRAZODONE HCL 50 MG PO TABS
25.0000 mg | ORAL_TABLET | Freq: Every day | ORAL | Status: DC
  Administered 2021-09-07 – 2021-09-09 (×3): 25 mg via ORAL
  Filled 2021-09-07 (×3): qty 1

## 2021-09-07 MED ORDER — HEPARIN BOLUS VIA INFUSION
850.0000 [IU] | Freq: Once | INTRAVENOUS | Status: AC
  Administered 2021-09-07: 850 [IU] via INTRAVENOUS
  Filled 2021-09-07: qty 850

## 2021-09-07 MED ORDER — NICOTINE 21 MG/24HR TD PT24
21.0000 mg | MEDICATED_PATCH | Freq: Every day | TRANSDERMAL | Status: DC
  Filled 2021-09-07 (×2): qty 1

## 2021-09-07 MED ORDER — FAMOTIDINE 20 MG PO TABS: 20.0000 mg | ORAL_TABLET | Freq: Two times a day (BID) | ORAL | Status: DC | PRN

## 2021-09-07 MED ORDER — ONDANSETRON HCL 4 MG/2ML IJ SOLN
4.0000 mg | Freq: Four times a day (QID) | INTRAMUSCULAR | Status: DC | PRN
  Administered 2021-09-07: 4 mg via INTRAVENOUS
  Filled 2021-09-07: qty 2

## 2021-09-07 MED ORDER — ALBUTEROL SULFATE HFA 108 (90 BASE) MCG/ACT IN AERS: 2.0000 | INHALATION_SPRAY | Freq: Four times a day (QID) | RESPIRATORY_TRACT | Status: DC | PRN

## 2021-09-07 NOTE — Assessment & Plan Note (Signed)
Continue Symbicort.  DuoNebs as needed

## 2021-09-07 NOTE — H&P (View-Only) (Signed)
[facility] Vascular Consult Note  MRN : [id number]  Christopher A Muscato Jr. is a 61 y.o. (07/23/1960) male who presents with chief complaint of  Chief Complaint  Patient presents with   Leg Pain  .   Consulting Physician: Richard Wieting, MD Reason for consult: Left lower extremity ischemia History of Present Illness: Christopher Bryan is a 61-year-old male that presented to [facility] due to having pain in his left lower extremity over the last 3 days.  The patient has a history of previous vascular interventions including a femorofemoral bypass.  He notes that he has had other interventions in his left lower extremity including a possible bypass.  He notes that he recently moved and realized that in the process of moving and forgot to take his medications.  He was previously on Plavix and Eliquis.  He notes that he missed approximately 8 days of medication.  He continues to have rest pain of the left lower extremity.  He is still neurologically intact and he is able to wiggle his toes although it is uncomfortable to do so.  There are no open wounds or ulcerations.  Current Facility-Administered Medications  Medication Dose Route Frequency Provider Last Rate Last Admin   0.9 %  sodium chloride infusion   Intravenous Continuous Wieting, Richard, MD 75 mL/hr at 09/07/21 0822 Rate Change at 09/07/21 0822   acetaminophen (TYLENOL) tablet 650 mg  650 mg Oral Q6H PRN Duncan, Hazel V, MD       Or   acetaminophen (TYLENOL) suppository 650 mg  650 mg Rectal Q6H PRN Duncan, Hazel V, MD       albuterol (PROVENTIL) (2.5 MG/3ML) 0.083% nebulizer solution 2.5 mg  2.5 mg Nebulization Q6H PRN Belue, Nathan S, RPH       atorvastatin (LIPITOR) tablet 80 mg  80 mg Oral QHS Duncan, Hazel V, MD       carvedilol (COREG) tablet 6.25 mg  6.25 mg Oral BID WC Wieting, Richard, MD   6.25 mg at 09/07/21 0820   famotidine (PEPCID) tablet 20 mg  20 mg Oral BID PRN Duncan, Hazel  V, MD       feeding supplement (ENSURE ENLIVE / ENSURE PLUS) liquid 237 mL  237 mL Oral BID BM Wieting, Richard, MD       heparin ADULT infusion 100 units/mL (25000 units/250mL)  1,100 Units/hr Intravenous Continuous Childs, Caroline A, RPH 11 mL/hr at 09/07/21 0821 1,100 Units/hr at 09/07/21 0821   hydrALAZINE (APRESOLINE) injection 5 mg  5 mg Intravenous Q6H PRN Duncan, Hazel V, MD       lisinopril (ZESTRIL) tablet 20 mg  20 mg Oral Daily Duncan, Hazel V, MD   20 mg at 09/07/21 0819   mometasone-formoterol (DULERA) 200-5 MCG/ACT inhaler 2 puff  2 puff Inhalation BID Duncan, Hazel V, MD       morphine (PF) 2 MG/ML injection 2 mg  2 mg Intravenous Q2H PRN Duncan, Hazel V, MD   2 mg at 09/07/21 1318   nicotine (NICODERM CQ - dosed in mg/24 hours) patch 21 mg  21 mg Transdermal Daily Duncan, Hazel V, MD       ondansetron (ZOFRAN) tablet 4 mg  4 mg Oral Q6H PRN Duncan, Hazel V, MD       Or   ondansetron (ZOFRAN) injection 4 mg  4 mg Intravenous Q6H PRN Duncan, Hazel V, MD   4 mg at 09/07/21 0402   traZODone (DESYREL) tablet 25 mg    25 mg Oral QHS Andris Baumann, MD        Past Medical History:  Diagnosis Date   DVT (deep venous thrombosis) (HCC)    Hypertension     Past Surgical History:  Procedure Laterality Date   KNEE ARTHROSCOPY Left    LOWER EXTREMITY ANGIOGRAPHY Left 01/21/2020   Procedure: Lower Extremity Angiography;  Surgeon: Renford Dills, MD;  Location: [facility] INVASIVE CV LAB;  Service: Cardiovascular;  Laterality: Left;   ORIF FEMUR FRACTURE Right     Social History Social History   Tobacco Use   Smoking status: Some Days   Smokeless tobacco: Never  Substance Use Topics   Alcohol use: Yes   Drug use: No    Family History Family History  Problem Relation Age of Onset   CAD Mother    Emphysema Father     Allergies  Allergen Reactions   Iodine Rash   Ivp Dye [Iodinated Contrast Media] Hives     REVIEW OF SYSTEMS (Negative unless  checked)  Constitutional: [] Weight loss  [] Fever  [] Chills Cardiac: [] Chest pain   [] Chest pressure   [] Palpitations   [] Shortness of breath when laying flat   [] Shortness of breath at rest   [] Shortness of breath with exertion. Vascular:  [] Pain in legs with walking   [] Pain in legs at rest   [] Pain in legs when laying flat   [] Claudication   [] Pain in feet when walking  [x] Pain in feet at rest  [] Pain in feet when laying flat   [] History of DVT   [] Phlebitis   [] Swelling in legs   [] Varicose veins   [] Non-healing ulcers Pulmonary:   [] Uses home oxygen   [] Productive cough   [] Hemoptysis   [] Wheeze  [] COPD   [] Asthma Neurologic:  [] Dizziness  [] Blackouts   [] Seizures   [] History of stroke   [] History of TIA  [] Aphasia   [] Temporary blindness   [] Dysphagia   [] Weakness or numbness in arms   [] Weakness or numbness in legs Musculoskeletal:  [] Arthritis   [] Joint swelling   [] Joint pain   [] Low back pain Hematologic:  [] Easy bruising  [] Easy bleeding   [] Hypercoagulable state   [] Anemic  [] Hepatitis Gastrointestinal:  [] Blood in stool   [] Vomiting blood  [] Gastroesophageal reflux/heartburn   [] Difficulty swallowing. Genitourinary:  [] Chronic kidney disease   [] Difficult urination  [] Frequent urination  [] Burning with urination   [] Blood in urine Skin:  [] Rashes   [] Ulcers   [] Wounds Psychological:  [] History of anxiety   []  History of major depression.  Physical Examination  Vitals:   09/07/21 1130 09/07/21 1300 09/07/21 1311 09/07/21 1443  BP: (!) 126/52 (!) 150/88  (!) 141/70  Pulse: (!) 56 (!) 141 68 76  Resp: 14 13 17 18   Temp:    98.3 F (36.8 C)  TempSrc:      SpO2: 90% (!) 87% 93% 94%  Weight:      Height:       Body mass index is 18.44 kg/m. Gen:  WD/WN, NAD Head: Wink/AT, No temporalis wasting. Prominent temp pulse not noted. Ear/Nose/Throat: Hearing grossly intact, nares w/o erythema or drainage, oropharynx w/o Erythema/Exudate Eyes: Sclera non-icteric, conjunctiva clear Neck:  Trachea midline.  No JVD.  Pulmonary:  Good air movement, respirations not labored, equal bilaterally.  Cardiac: RRR, normal S1, S2. Vascular: Nonpalpable pulses bilaterally.  Left toes cool.  Continues to have motor function in left foot.  Gastrointestinal: soft, non-tender/non-distended. No guarding/reflex.  Musculoskeletal: M/S 5/5 throughout.  Extremities without  ischemic changes.  No deformity or atrophy. No edema.  Psychiatric: Judgment intact, Mood & affect appropriate for pt's clinical situation.    CBC Lab Results  Component Value Date   WBC 5.1 09/06/2021   HGB 12.1 (L) 09/06/2021   HCT 38.1 (L) 09/06/2021   MCV 90.7 09/06/2021   PLT 205 09/06/2021    BMET    Component Value Date/Time   NA 141 09/06/2021 2335   K 4.4 09/06/2021 2335   CL 107 09/06/2021 2335   CO2 27 09/06/2021 2335   GLUCOSE 136 (H) 09/06/2021 2335   BUN 16 09/06/2021 2335   CREATININE 1.42 (H) 09/06/2021 2335   CALCIUM 9.4 09/06/2021 2335   GFRNONAA 56 (L) 09/06/2021 2335   GFRAA >60 01/11/2018 0356   Estimated Creatinine Clearance: 41.4 mL/min (A) (by C-G formula based on SCr of 1.42 mg/dL (H)).  COAG Lab Results  Component Value Date   INR 1.0 09/06/2021   INR 1.2 10/26/2020   INR 0.8 01/20/2020    Radiology No results found.    Assessment/Plan 1.  Left lower extremity ischemia  Likely an acute occlusion due to medication noncompliance.  The patient does have a severe contrast allergy so we will ensure that he has the 13-hour contrast protocol implemented.  Will also hydrate the patient prior to intervention due to concern for possible acute kidney injury.  Discussed with patient that he will need to stop smoking.  We also stressed the importance of compliance with blood thinners.  Will plan on angiogram tomorrow.  Continue heparin infusion.  Continue statin.  Patient will be n.p.o. at midnight.  2.  Hypertension Continue outpatient medication  3.  Acute kidney injury Will  closely continue to monitor creatinine in setting of contrast interventions.  Family Communication: None at bedside  Thank you for allowing [place] to participate in the care of this patient.   Georgiana Spinner, NP [facility] [phone] (Office Phone) [phone] (Office Fax) [phone] (Pager)  09/07/2021 2:49 PM  Staff may message me via secure chat in Epic  but this may not receive immediate response,  please page for urgent matters!  Dictation software was used to generate the above note. Typos may occur and escape review, as with typed/written notes. Any error is purely unintentional.  Please contact me directly for clarity if needed.

## 2021-09-07 NOTE — H&P (Signed)
History and Physical    Patient: Christopher Bryan. NWG:[id number] DOB: 1960-11-26 DOA: 09/07/2021 DOS: the patient was seen and examined on 09/07/2021 [facility]: [facility]  Patient coming from: Home  Chief Complaint:  Chief Complaint  Patient presents with   Leg Pain    HPI: Christopher Bryan. is a 61 y.o. male with medical history significant for COPD, HTN, nicotine dependence, severe peripheral artery disease s/p multiple surgeries, including fem-fem bypass in August 21 and subsequent multiple stent placements, who presents to the ED with left leg pain and swelling ongoing for the past 3 days becoming acutely worse on the night of arrival. ED course and data review: Temperature is 103, heart rate 118 with BP 149/79 and O2 sat 93% on room air.  WBC was normal and hemoglobin 12.1.  Creatinine 1.42 above most recent baseline of 0.91 EKG showing sinus at 95 with no acute ST-T wave changes Left DP and PT pulses were not dopplerable in the ED. The ED provider spoke with vascular surgeon, Dr. Wyn Quaker who recommended heparin infusion and admission to hospitalist service and will see in the a.m.  Hospitalist thus consulted     Past Medical History:  Diagnosis Date   DVT (deep venous thrombosis) (HCC)    Hypertension    Past Surgical History:  Procedure Laterality Date   KNEE ARTHROSCOPY Left    LOWER EXTREMITY ANGIOGRAPHY Left 01/21/2020   Procedure: Lower Extremity Angiography;  Surgeon: Renford Dills, MD;  Location: [facility] INVASIVE CV LAB;  Service: Cardiovascular;  Laterality: Left;   ORIF FEMUR FRACTURE Right    Social History:  reports that he has been smoking. He has never used smokeless tobacco. He reports current alcohol use. He reports that he does not use drugs.  Allergies  Allergen Reactions   Iodine Rash   Ivp Dye [Iodinated Contrast Media] Hives    Family History  Problem Relation Age of Onset   CAD Mother    Emphysema Father     Prior to Admission  medications   Medication Sig Start Date End Date Taking? Authorizing Provider  acetaminophen (TYLENOL) 325 MG tablet Take 650 mg by mouth 3 (three) times daily as needed for mild pain or fever.    [provider]  albuterol (PROVENTIL HFA;VENTOLIN HFA) 108 (90 Base) MCG/ACT inhaler Inhale 2 puffs into the lungs every 6 (six) hours as needed for wheezing or shortness of breath.    [provider]  apixaban (ELIQUIS) 5 MG TABS tablet Take 1 tablet (5 mg total) by mouth 2 (two) times daily for 7 days. 01/24/20 01/31/20  Tresa Moore, MD  atorvastatin (LIPITOR) 20 MG tablet Take 20 mg by mouth at bedtime.    [provider]  budesonide-formoterol (SYMBICORT) 160-4.5 MCG/ACT inhaler Inhale 2 puffs into the lungs 2 (two) times daily. 06/23/16   Enid Baas, MD  carboxymethylcellulose (REFRESH PLUS) 0.5 % SOLN Place 1 drop into both eyes 4 (four) times daily as needed.    [provider]  carvedilol (COREG) 25 MG tablet Take 1 tablet (25 mg total) by mouth 2 (two) times daily with a meal. 01/12/18   Shaune Pollack, MD  cholecalciferol (VITAMIN D) 1000 units tablet Take 2,000 Units by mouth daily.    [provider]  famotidine (PEPCID) 20 MG tablet Take 20 mg by mouth 2 (two) times daily as needed for heartburn or indigestion.    [provider]  gabapentin (NEURONTIN) 400 MG capsule Take  1 capsule (400 mg total) by mouth 3 (three) times daily for 20 days. 01/24/20 02/13/20  Tresa Moore, MD  gabapentin (NEURONTIN) 800 MG tablet Take 800 mg by mouth 3 (three) times daily.     [provider]  ipratropium-albuterol (DUONEB) 0.5-2.5 (3) MG/3ML SOLN Take 3 mLs by nebulization every 6 (six) hours as needed. Patient not taking: No sig reported 06/23/16   Enid Baas, MD  lisinopril (PRINIVIL,ZESTRIL) 10 MG tablet Take 20 mg by mouth daily.     [provider]  loratadine (CLARITIN) 10 MG tablet Take 10 mg by mouth daily  as needed for allergies.    [provider]  nicotine (NICODERM CQ - DOSED IN MG/24 HOURS) 21 mg/24hr patch Place 1 patch (21 mg total) onto the skin daily. 06/24/16   Enid Baas, MD  ondansetron (ZOFRAN ODT) 4 MG disintegrating tablet Take 1 tablet (4 mg total) by mouth every 8 (eight) hours as needed for nausea or vomiting. 01/24/20   Tresa Moore, MD  sennosides-docusate sodium (SENOKOT-S) 8.6-50 MG tablet Take 1-2 tablets by mouth at bedtime as needed for constipation.    [provider]  sildenafil (VIAGRA) 100 MG tablet Take 100 mg by mouth daily as needed for erectile dysfunction.    [provider]  tiotropium (SPIRIVA) 18 MCG inhalation capsule Place 18 mcg into inhaler and inhale daily.    [provider]  traZODone (DESYREL) 50 MG tablet Take 25 mg by mouth at bedtime.    [provider]    Physical Exam: Vitals:   09/06/21 2325 09/06/21 2343 09/07/21 0030  BP: (!) 149/79  (!) 167/84  Pulse: (!) 118  85  Resp: 19  18  Temp: 100.3 F (37.9 C)    TempSrc: Oral    SpO2: 93%  96%  Weight:  55 kg   Height:  5\' 8"  (1.727 m)    Physical Exam Vitals and nursing note reviewed.  Constitutional:      General: He is not in acute distress.    Appearance: He is underweight.  HENT:     Head: Normocephalic and atraumatic.  Cardiovascular:     Rate and Rhythm: Normal rate and regular rhythm.     Heart sounds: Normal heart sounds.     Comments: Left leg cool to touch unable. to palpate pulses DP and PT pulses bilaterally Pulmonary:     Effort: Pulmonary effort is normal.     Breath sounds: Normal breath sounds.  Abdominal:     Palpations: Abdomen is soft.     Tenderness: There is no abdominal tenderness.  Neurological:     Mental Status: Mental status is at baseline.     Labs on Admission: I have personally reviewed following labs and imaging studies  CBC: Recent Labs  Lab 09/06/21 2335  WBC 5.1  HGB 12.1*  HCT  38.1*  MCV 90.7  PLT 205   Basic Metabolic Panel: Recent Labs  Lab 09/06/21 2335  NA 141  K 4.4  CL 107  CO2 27  GLUCOSE 136*  BUN 16  CREATININE 1.42*  CALCIUM 9.4   GFR: Estimated Creatinine Clearance: 41.4 mL/min (A) (by C-G formula based on SCr of 1.42 mg/dL (H)). Liver Function Tests: No results for input(s): "AST", "ALT", "ALKPHOS", "BILITOT", "PROT", "ALBUMIN" in the last 168 hours. No results for input(s): "LIPASE", "AMYLASE" in the last 168 hours. No results for input(s): "AMMONIA" in the last 168 hours. Coagulation Profile: Recent Labs  Lab  09/06/21 2335  INR 1.0   Cardiac Enzymes: No results for input(s): "CKTOTAL", "CKMB", "CKMBINDEX", "TROPONINI" in the last 168 hours. BNP (last 3 results) No results for input(s): "PROBNP" in the last 8760 hours. HbA1C: No results for input(s): "HGBA1C" in the last 72 hours. CBG: No results for input(s): "GLUCAP" in the last 168 hours. Lipid Profile: No results for input(s): "CHOL", "HDL", "LDLCALC", "TRIG", "CHOLHDL", "LDLDIRECT" in the last 72 hours. Thyroid Function Tests: No results for input(s): "TSH", "T4TOTAL", "FREET4", "T3FREE", "THYROIDAB" in the last 72 hours. Anemia Panel: No results for input(s): "VITAMINB12", "FOLATE", "FERRITIN", "TIBC", "IRON", "RETICCTPCT" in the last 72 hours. Urine analysis: No results found for: "COLORURINE", "APPEARANCEUR", "LABSPEC", "PHURINE", "GLUCOSEU", "HGBUR", "BILIRUBINUR", "KETONESUR", "PROTEINUR", "UROBILINOGEN", "NITRITE", "LEUKOCYTESUR"  Radiological Exams on Admission: No results found.   Data Reviewed: Relevant notes from primary care and specialist visits, past discharge summaries as available in EHR, including Care Everywhere. Prior diagnostic testing as pertinent to current admission diagnoses Updated medications and problem lists for reconciliation ED course, including vitals, labs, imaging, treatment and response to treatment Triage notes, nursing and pharmacy  notes and ED provider's notes Notable results as noted in HPI   Assessment and Plan: * Critical limb ischemia of left lower extremity (HCC) Severe PAD s/p  fem-fem bypass and multiple stent placements Heparin infusion.  Continue atorvastatin Pain control Vascular consult.  Dr. Wyn Quaker was contacted from the ED   COPD (chronic obstructive pulmonary disease) with emphysema (HCC) Continue Symbicort.  DuoNebs as needed  Nicotine dependence Nicotine patch  Hypertension BP slightly elevated.  Continue carvedilol and lisinopril  Chronic diastolic CHF (congestive heart failure) (HCC) Euvolemic Continue lisinopril and carvedilol        DVT prophylaxis: Heparin infusion  Consults: Vascular consult, Dr. Wyn Quaker  Advance Care Planning:   Code Status: Prior   Family Communication: none  Disposition Plan: Back to previous home environment  Severity of Illness: The appropriate patient status for this patient is INPATIENT. Inpatient status is judged to be reasonable and necessary in order to provide the required intensity of service to ensure the patient's safety. The patient's presenting symptoms, physical exam findings, and initial radiographic and laboratory data in the context of their chronic comorbidities is felt to place them at high risk for further clinical deterioration. Furthermore, it is not anticipated that the patient will be medically stable for discharge from the hospital within 2 midnights of admission.   * I certify that at the point of admission it is my clinical judgment that the patient will require inpatient hospital care spanning beyond 2 midnights from the point of admission due to high intensity of service, high risk for further deterioration and high frequency of surveillance required.*  Author: Andris Baumann, MD 09/07/2021 12:58 AM  For on call review [email or web address].

## 2021-09-07 NOTE — Assessment & Plan Note (Addendum)
Continue carvedilol.  Hold lisinopril secondary to hyperkalemia.  Start Norvasc instead.

## 2021-09-07 NOTE — Progress Notes (Signed)
ANTICOAGULATION CONSULT NOTE  Pharmacy Consult for heparin infusion Indication: VTE Treatment  Allergies  Allergen Reactions   Iodine Rash   Ivp Dye [Iodinated Contrast Media] Hives    Patient Measurements: Height: 5\' 8"  (172.7 cm) Weight: 55 kg (121 lb 4.1 oz) IBW/kg (Calculated) : 68.4 Heparin Dosing Weight: 55 kg  Vital Signs: Temp: 98.3 F (36.8 C) (08/08 0525) Temp Source: Oral (08/08 0525) BP: 120/65 (08/08 0430) Pulse Rate: 85 (08/08 0434)  Labs: Recent Labs    09/06/21 2335 09/07/21 0021 09/07/21 0615  HGB 12.1*  --   --   HCT 38.1*  --   --   PLT 205  --   --   LABPROT 13.5  --   --   INR 1.0  --   --   HEPARINUNFRC  --  <0.10* 0.29*  CREATININE 1.42*  --   --      Estimated Creatinine Clearance: 41.4 mL/min (A) (by C-G formula based on SCr of 1.42 mg/dL (H)).   Medical History: Past Medical History:  Diagnosis Date   DVT (deep venous thrombosis) (HCC)    Hypertension    Mediations: PTA meds:  Apixaban 5 mg BID  Assessment: Pt is a 61 yo male presenting to ED with hx of PAD, bypass, & multiple stent placements c/o leg pain & swelling being started on heparin for critical limb ischemia of left lower extremity.  Goal of Therapy:  Heparin level 0.3-0.7 units/ml Monitor platelets by anticoagulation protocol: Yes   Date/Time HL Rate/Comment 8/8 0615 0.29 Subtherapeutic   BL HL < 0.1, will follow HL. Baseline CBC stable.   Plan: heparin level subtherapeutic Bolus 850 units x 1 and increase heparin infusion to 1100 units/hr Heparin level in 6 hours CBC daily  62, PharmD Clinical Pharmacist  09/07/2021 7:41 AM

## 2021-09-07 NOTE — Assessment & Plan Note (Addendum)
Severe PAD s/p  fem-fem bypass and multiple stent placements Status post angiogram and procedures by Dr. Wyn Quaker on 09/08/2021.  Patient on Eliquis and Plavix.  Will need close follow-up with vascular surgery as outpatient.  Will stop smoking.  With the patient's to pain he was uncomfortable going home today.

## 2021-09-07 NOTE — Progress Notes (Signed)
ANTICOAGULATION CONSULT NOTE  Pharmacy Consult for heparin infusion Indication: VTE Treatment  Allergies  Allergen Reactions   Iodine Rash   Ivp Dye [Iodinated Contrast Media] Hives    Patient Measurements: Height: 5\' 8"  (172.7 cm) Weight: 55 kg (121 lb 4.1 oz) IBW/kg (Calculated) : 68.4 Heparin Dosing Weight: 55 kg  Vital Signs: Temp: 98.3 F (36.8 C) (08/08 1443) Temp Source: Oral (08/08 0954) BP: 141/70 (08/08 1443) Pulse Rate: 76 (08/08 1443)  Labs: Recent Labs    09/06/21 2335 09/07/21 0021 09/07/21 0615  HGB 12.1*  --   --   HCT 38.1*  --   --   PLT 205  --   --   LABPROT 13.5  --   --   INR 1.0  --   --   HEPARINUNFRC  --  <0.10* 0.29*  CREATININE 1.42*  --   --      Estimated Creatinine Clearance: 41.4 mL/min (A) (by C-G formula based on SCr of 1.42 mg/dL (H)).   Medical History: Past Medical History:  Diagnosis Date   DVT (deep venous thrombosis) (HCC)    Hypertension    Mediations: PTA meds:  Apixaban 5 mg BID  Assessment: Pt is a 61 yo male presenting to ED with hx of PAD, bypass, & multiple stent placements c/o leg pain & swelling being started on heparin for critical limb ischemia of left lower extremity.  Goal of Therapy:  Heparin level 0.3-0.7 units/ml Monitor platelets by anticoagulation protocol: Yes   Plan: heparin level therapeutic continue heparin infusion at 1100 units/hr Repeat heparin level in 6 hours to confirm CBC daily  62, PharmD, BCPS Clinical Pharmacist  09/07/2021 3:23 PM

## 2021-09-07 NOTE — Assessment & Plan Note (Addendum)
Euvolemic.  Watch closely with IV fluids. Continue lisinopril and carvedilol

## 2021-09-07 NOTE — ED Provider Notes (Signed)
[facility] Provider Note    Event Date/Time   First Christopher Bryan Initiated Contact with Patient 09/07/21 0006     (approximate)   History   Leg Pain   HPI  Christopher Paget. is a 61 y.o. male with history of hypertension, peripheral arterial disease on Plavix and Eliquis with medical noncompliance who presents to the emergency department with complaints of left leg pain for the past 3 days.  No injury.  Pain worsened tonight.  No chest pain or shortness of breath.  Leg feels cold to touch.   History provided by patient.    Past Medical History:  Diagnosis Date   DVT (deep venous thrombosis) (HCC)    Hypertension     Past Surgical History:  Procedure Laterality Date   KNEE ARTHROSCOPY Left    LOWER EXTREMITY ANGIOGRAPHY Left 01/21/2020   Procedure: Lower Extremity Angiography;  Surgeon: Christopher Dills, Christopher Bryan;  Location: [facility] INVASIVE CV LAB;  Service: Cardiovascular;  Laterality: Left;   ORIF FEMUR FRACTURE Right     MEDICATIONS:  Prior to Admission medications   Medication Sig Start Date End Date Taking? Authorizing Provider  acetaminophen (TYLENOL) 325 MG tablet Take 650 mg by mouth 3 (three) times daily as needed for mild pain or fever.    Provider, Historical, Christopher Bryan  albuterol (PROVENTIL HFA;VENTOLIN HFA) 108 (90 Base) MCG/ACT inhaler Inhale 2 puffs into the lungs every 6 (six) hours as needed for wheezing or shortness of breath.    Provider, Historical, Christopher Bryan  apixaban (ELIQUIS) 5 MG TABS tablet Take 1 tablet (5 mg total) by mouth 2 (two) times daily for 7 days. 01/24/20 01/31/20  Christopher Moore, Christopher Bryan  atorvastatin (LIPITOR) 20 MG tablet Take 20 mg by mouth at bedtime.    Provider, Historical, Christopher Bryan  budesonide-formoterol (SYMBICORT) 160-4.5 MCG/ACT inhaler Inhale 2 puffs into the lungs 2 (two) times daily. 06/23/16   Christopher Baas, Christopher Bryan  carboxymethylcellulose (REFRESH PLUS) 0.5 % SOLN Place 1 drop into both eyes 4 (four) times daily as needed.     Provider, Historical, Christopher Bryan  carvedilol (COREG) 25 MG tablet Take 1 tablet (25 mg total) by mouth 2 (two) times daily with a meal. 01/12/18   Christopher Pollack, Christopher Bryan  cholecalciferol (VITAMIN D) 1000 units tablet Take 2,000 Units by mouth daily.    Provider, Historical, Christopher Bryan  famotidine (PEPCID) 20 MG tablet Take 20 mg by mouth 2 (two) times daily as needed for heartburn or indigestion.    Provider, Historical, Christopher Bryan  gabapentin (NEURONTIN) 400 MG capsule Take 1 capsule (400 mg total) by mouth 3 (three) times daily for 20 days. 01/24/20 02/13/20  Christopher Moore, Christopher Bryan  gabapentin (NEURONTIN) 800 MG tablet Take 800 mg by mouth 3 (three) times daily.     Provider, Historical, Christopher Bryan  ipratropium-albuterol (DUONEB) 0.5-2.5 (3) MG/3ML SOLN Take 3 mLs by nebulization every 6 (six) hours as needed. Patient not taking: No sig reported 06/23/16   Christopher Baas, Christopher Bryan  lisinopril (PRINIVIL,ZESTRIL) 10 MG tablet Take 20 mg by mouth daily.     Provider, Historical, Christopher Bryan  loratadine (CLARITIN) 10 MG tablet Take 10 mg by mouth daily as needed for allergies.    Provider, Historical, Christopher Bryan  nicotine (NICODERM CQ - DOSED IN MG/24 HOURS) 21 mg/24hr patch Place 1 patch (21 mg total) onto the skin daily. 06/24/16   Christopher Baas, Christopher Bryan  ondansetron (ZOFRAN ODT) 4 MG disintegrating tablet Take 1 tablet (4 mg total) by mouth every 8 (eight) hours  as needed for nausea or vomiting. 01/24/20   Christopher Moore, Christopher Bryan  sennosides-docusate sodium (SENOKOT-S) 8.6-50 MG tablet Take 1-2 tablets by mouth at bedtime as needed for constipation.    Provider, Historical, Christopher Bryan  sildenafil (VIAGRA) 100 MG tablet Take 100 mg by mouth daily as needed for erectile dysfunction.    Provider, Historical, Christopher Bryan  tiotropium (SPIRIVA) 18 MCG inhalation capsule Place 18 mcg into inhaler and inhale daily.    Provider, Historical, Christopher Bryan  traZODone (DESYREL) 50 MG tablet Take 25 mg by mouth at bedtime.    Provider, Historical, Christopher Bryan    Physical Exam   Triage Vital Signs: ED  Triage Vitals  Enc Vitals Group     BP 09/06/21 2325 (!) 149/79     Pulse Rate 09/06/21 2325 (!) 118     Resp 09/06/21 2325 19     Temp 09/06/21 2325 100.3 F (37.9 C)     Temp Source 09/06/21 2325 Oral     SpO2 09/06/21 2325 93 %     Weight 09/06/21 2343 121 lb 4.1 oz (55 kg)     Height 09/06/21 2343 5\' 8"  (1.727 m)     Head Circumference --      Peak Flow --      Pain Score 09/06/21 2323 10     Pain Loc --      Pain Edu? --      Excl. in GC? --     Most recent vital signs: Vitals:   09/06/21 2325 09/07/21 0030  BP: (!) 149/79 (!) 167/84  Pulse: (!) 118 85  Resp: 19 18  Temp: 100.3 F (37.9 C)   SpO2: 93% 96%    CONSTITUTIONAL: Alert and oriented and responds appropriately to questions.  Thin, chronically ill-appearing, in no significant distress but does appear uncomfortable HEAD: Normocephalic, atraumatic EYES: Conjunctivae clear, pupils appear equal, sclera nonicteric ENT: normal nose; moist mucous membranes NECK: Supple, normal ROM CARD: Regular and tachycardic; S1 and S2 appreciated; no murmurs, no clicks, no rubs, no gallops RESP: Normal chest excursion without splinting or tachypnea; breath sounds clear and equal bilaterally; no wheezes, no rhonchi, no rales, no hypoxia or respiratory distress, speaking full sentences ABD/GI: Normal bowel sounds; non-distended; soft, non-tender, no rebound, no guarding, no peritoneal signs BACK: The back appears normal EXT: Left distal lower extremity feels cool to touch compared to the right but no changes in skin color.  No open wounds or gangrene.  I am able to Doppler a 2+ DP pulse on the right but not able to Doppler a right PT pulse.  I am unable to Doppler a DP or PT pulse on the left and he has a monophasic popliteal pulse on the left.  Compartments soft.  No joint effusion. SKIN: Normal color for age and race; warm; no rash on exposed skin NEURO: Moves all extremities equally, normal speech PSYCH: The patient's mood and  manner are appropriate.   ED Results / Procedures / Treatments   LABS: (all labs ordered are listed, but only abnormal results are displayed) Labs Reviewed  BASIC METABOLIC PANEL - Abnormal; Notable for the following components:      Result Value   Glucose, Bld 136 (*)    Creatinine, Ser 1.42 (*)    GFR, Estimated 56 (*)    All other components within normal limits  CBC - Abnormal; Notable for the following components:   RBC 4.20 (*)    Hemoglobin 12.1 (*)    HCT 38.1 (*)  All other components within normal limits  SARS CORONAVIRUS 2 BY RT PCR  PROTIME-INR  HEPARIN LEVEL (UNFRACTIONATED)  TYPE AND SCREEN     EKG:  EKG Interpretation  Date/Time:  Tuesday September 07 2021 00:50:17 EDT Ventricular Rate:  95 PR Interval:  148 QRS Duration: 94 QT Interval:  344 QTC Calculation: 433 R Axis:   57 Text Interpretation: Sinus rhythm LVH with secondary repolarization abnormality Anterior Q waves, possibly due to LVH Artifact in lead(s) I II III aVR aVL aVF V1 V2 V4 Confirmed by Rochele Bryan [phone]) on 09/07/2021 1:01:07 AM         RADIOLOGY: My personal review and interpretation of imaging:    I have personally reviewed all radiology reports.   No results found.   PROCEDURES:  Critical Care performed: Yes, see critical care procedure note(s)   CRITICAL CARE Performed by: Rochele Bryan   Total critical care time: 45 minutes  Critical care time was exclusive of separately billable procedures and treating other patients.  Critical care was necessary to treat or prevent imminent or life-threatening deterioration.  Critical care was time spent personally by me on the following activities: development of treatment plan with patient and/or surrogate as well as nursing, discussions with consultants, evaluation of patient's response to treatment, examination of patient, obtaining history from patient or surrogate, ordering and performing treatments and interventions,  ordering and review of laboratory studies, ordering and review of radiographic studies, pulse oximetry and re-evaluation of patient's condition.   Marland Kitchen1-3 Lead EKG Interpretation  Performed by: Christopher Bryan, Christopher Maw, Christopher Bryan Authorized by: Christopher Bryan, Christopher Maw, Christopher Bryan     Interpretation: abnormal     ECG rate:  118   ECG rate assessment: tachycardic     Rhythm: sinus tachycardia     Ectopy: none     Conduction: normal       IMPRESSION / MDM / ASSESSMENT AND PLAN / ED COURSE  I reviewed the triage vital signs and the nursing notes.    Patient here with left leg pain for the past 3 days.  Unable to palpate or Doppler pulses distally.  Known history of peripheral arterial disease and reports he has not been compliant with Plavix and Eliquis recently.  The patient is on the cardiac monitor to evaluate for evidence of arrhythmia and/or significant heart rate changes.   DIFFERENTIAL DIAGNOSIS (includes but not limited to):   Ischemic leg.  No sign of cerulea dolens.  No signs of cellulitis.  No history of injury.  No sign of compartment syndrome.   Patient's presentation is most consistent with acute presentation with potential threat to life or bodily function.   PLAN: We will obtain CBC, BMP, INR and start heparin.  We will give pain medication.  He is allergic to IV contrast.  We will go ahead and premedicate with Solu-Cortef and Benadryl.  Will discuss with vascular surgery.  Patient does have an oral temperature here of 100.3.  He denies chest pain, shortness of breath, cough, congestion, abdominal pain, vomiting, diarrhea, dysuria, hematuria, rash, headache, neck stiffness.   MEDICATIONS GIVEN IN ED: Medications  0.9 %  sodium chloride infusion (has no administration in time range)  heparin ADULT infusion 100 units/mL (25000 units/270mL) (900 Units/hr Intravenous New Bag/Given 09/07/21 0043)  sodium chloride 0.9 % bolus 1,000 mL (1,000 mLs Intravenous New Bag/Given 09/07/21 0040)  methylPREDNISolone  sodium succinate (SOLU-MEDROL) 40 mg/mL injection 40 mg (40 mg Intravenous Given 09/07/21 0027)  diphenhydrAMINE (BENADRYL) capsule 50 mg (  Oral See Alternative 09/07/21 0028)    Or  diphenhydrAMINE (BENADRYL) injection 50 mg (50 mg Intravenous Given 09/07/21 0028)  HYDROmorphone (DILAUDID) injection 1 mg (1 mg Intravenous Given 09/07/21 0040)  heparin bolus via infusion 3,300 Units (3,300 Units Intravenous Bolus from Bag 09/07/21 0043)  acetaminophen (TYLENOL) tablet 1,000 mg (1,000 mg Oral Given 09/07/21 0055)     ED COURSE: Labs show no stable hemoglobin, no leukocytosis.  Normal electrolytes.  Mildly elevated creatinine compared to baseline.  Will give IV fluids.  Normal coags.   CONSULTS: Spoke with Christopher Bryan with vascular surgery.  He agrees with starting heparin and premedicating with Benadryl and steroids for procedure in the morning.  Recommends admission to the hospitalist.  He does not need a CTA runoff at this time.   Consulted and discussed patient's case with hospitalist, Christopher Bryan.  I have recommended admission and consulting physician agrees and will place admission orders.  Patient (and family if present) agree with this plan.   I reviewed all nursing notes, vitals, pertinent previous records.  All labs, EKGs, imaging ordered have been independently reviewed and interpreted by myself.   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  They are on diversion right now and not unable to accept the patient for admission.   OUTSIDE RECORDS REVIEWED: Reviewed patient's recent records at the [facility] in July 2023.       FINAL CLINICAL IMPRESSION(S) / ED DIAGNOSES   Final diagnoses:  Ischemic leg  AKI (acute kidney injury) (HCC)     Rx / DC Orders   ED Discharge Orders     None        Note:  This document was prepared using Dragon voice recognition software and may include unintentional dictation errors.   Red Mandt, Christopher Maw, Christopher Bryan 09/07/21 [phone]

## 2021-09-07 NOTE — Progress Notes (Signed)
  Progress Note   Patient: Christopher Bryan. QBH:[id number] DOB: 1961-01-09 DOA: 09/07/2021     0 DOS: the patient was seen and examined on 09/07/2021     Assessment and Plan: * Critical limb ischemia of left lower extremity (HCC) Severe PAD s/p  fem-fem bypass and multiple stent placements Heparin infusion.  Continue atorvastatin.  Pain control.  Discussed compliance with blood thinners.  Must stop smoking.  As per nursing staff, angiogram will be done tomorrow.  Will let the patient eat today.   AKI (acute kidney injury) (HCC) Versus chronic kidney disease stage IIIa.  Continue IV fluid hydration and continue to monitor creatinine.  I suspect acute kidney injury but will recheck creatinine tomorrow.  Hypertension Continue carvedilol and lisinopril  Nicotine dependence Nicotine patch  COPD (chronic obstructive pulmonary disease) with emphysema (HCC) Continue Symbicort.  DuoNebs as needed  Chronic diastolic CHF (congestive heart failure) (HCC) Euvolemic.  Watch closely with IV fluids. Continue lisinopril and carvedilol        Subjective: Patient had 100 out of 10 pain when he came into the hospital.  Now pain is 5 out of 10 on the heparin drip.  He states he has been out of his Plavix and Eliquis for about 8 days.  Coming in with left lower extremity pain from the knee down mostly in the foot.  Physical Exam: Vitals:   09/07/21 0954 09/07/21 1130 09/07/21 1300 09/07/21 1311  BP:  (!) 126/52 (!) 150/88   Pulse:  (!) 56 (!) 141 68  Resp:  14 13 17   Temp: 97.8 F (36.6 C)     TempSrc: Oral     SpO2:  90% (!) 87% 93%  Weight:      Height:       Physical Exam HENT:     Head: Normocephalic.     Mouth/Throat:     Pharynx: No oropharyngeal exudate.  Eyes:     General: Lids are normal.     Conjunctiva/sclera: Conjunctivae normal.  Cardiovascular:     Rate and Rhythm: Normal rate and regular rhythm.     Pulses:          Popliteal pulses are 0 on the left side.        Dorsalis pedis pulses are 0 on the left side.       Posterior tibial pulses are 0 on the left side.     Heart sounds: Normal heart sounds, S1 normal and S2 normal.  Pulmonary:     Breath sounds: No decreased breath sounds, wheezing, rhonchi or rales.  Abdominal:     Palpations: Abdomen is soft.     Tenderness: There is no abdominal tenderness.  Musculoskeletal:     Right lower leg: No swelling.     Left lower leg: No swelling.  Skin:    General: Skin is warm.     Findings: No rash.  Neurological:     Mental Status: He is alert and oriented to person, place, and time.     Data Reviewed: Creatinine 1.42 with a GFR 56, hemoglobin 12.1   Disposition: Status is: Inpatient Remains inpatient appropriate because: On heparin drip for left lower extremity ischemia.  Angiogram without be done today and will be done tomorrow.  Will let the patient eat today.  Planned Discharge Destination: Home    Time spent: 30 minutes  Author: , MD 09/07/2021 1:35 PM  For on call review [email or web address].

## 2021-09-07 NOTE — Consult Note (Signed)
[facility] Vascular Consult Note  MRN : [id number]  Christopher Bryan. is a 61 y.o. (February 21, 1960) male who presents with chief complaint of  Chief Complaint  Patient presents with   Leg Pain  .   Consulting Physician: Alford Highland, MD Reason for consult: Left lower extremity ischemia History of Present Illness: Christopher Bryan is a 61 year old male that presented to [facility] due to having pain in his left lower extremity over the last 3 days.  The patient has a history of previous vascular interventions including a femorofemoral bypass.  He notes that he has had other interventions in his left lower extremity including a possible bypass.  He notes that he recently moved and realized that in the process of moving and forgot to take his medications.  He was previously on Plavix and Eliquis.  He notes that he missed approximately 8 days of medication.  He continues to have rest pain of the left lower extremity.  He is still neurologically intact and he is able to wiggle his toes although it is uncomfortable to do so.  There are no open wounds or ulcerations.  Current Facility-Administered Medications  Medication Dose Route Frequency Provider Last Rate Last Admin   0.9 %  sodium chloride infusion   Intravenous Continuous Alford Highland, MD 75 mL/hr at 09/07/21 0822 Rate Change at 09/07/21 4098   acetaminophen (TYLENOL) tablet 650 mg  650 mg Oral Q6H PRN Andris Baumann, MD       Or   acetaminophen (TYLENOL) suppository 650 mg  650 mg Rectal Q6H PRN Andris Baumann, MD       albuterol (PROVENTIL) (2.5 MG/3ML) 0.083% nebulizer solution 2.5 mg  2.5 mg Nebulization Q6H PRN Otelia Sergeant, RPH       atorvastatin (LIPITOR) tablet 80 mg  80 mg Oral QHS Andris Baumann, MD       carvedilol (COREG) tablet 6.25 mg  6.25 mg Oral BID WC Alford Highland, MD   6.25 mg at 09/07/21 0820   famotidine (PEPCID) tablet 20 mg  20 mg Oral BID PRN Andris Baumann, MD       feeding supplement (ENSURE ENLIVE / ENSURE PLUS) liquid 237 mL  237 mL Oral BID BM Wieting, Richard, MD       heparin ADULT infusion 100 units/mL (25000 units/248mL)  1,100 Units/hr Intravenous Continuous Jaynie Bream, RPH 11 mL/hr at 09/07/21 0821 1,100 Units/hr at 09/07/21 1191   hydrALAZINE (APRESOLINE) injection 5 mg  5 mg Intravenous Q6H PRN Andris Baumann, MD       lisinopril (ZESTRIL) tablet 20 mg  20 mg Oral Daily Lindajo Royal V, MD   20 mg at 09/07/21 0819   mometasone-formoterol (DULERA) 200-5 MCG/ACT inhaler 2 puff  2 puff Inhalation BID Lindajo Royal V, MD       morphine (PF) 2 MG/ML injection 2 mg  2 mg Intravenous Q2H PRN Andris Baumann, MD   2 mg at 09/07/21 1318   nicotine (NICODERM CQ - dosed in mg/24 hours) patch 21 mg  21 mg Transdermal Daily Andris Baumann, MD       ondansetron [facility]) tablet 4 mg  4 mg Oral Q6H PRN Andris Baumann, MD       Or   ondansetron [facility]) injection 4 mg  4 mg Intravenous Q6H PRN Andris Baumann, MD   4 mg at 09/07/21 0402   traZODone (DESYREL) tablet 25 mg  25 mg Oral QHS Andris Baumann, MD        Past Medical History:  Diagnosis Date   DVT (deep venous thrombosis) (HCC)    Hypertension     Past Surgical History:  Procedure Laterality Date   KNEE ARTHROSCOPY Left    LOWER EXTREMITY ANGIOGRAPHY Left 01/21/2020   Procedure: Lower Extremity Angiography;  Surgeon: Renford Dills, MD;  Location: [facility] INVASIVE CV LAB;  Service: Cardiovascular;  Laterality: Left;   ORIF FEMUR FRACTURE Right     Social History Social History   Tobacco Use   Smoking status: Some Days   Smokeless tobacco: Never  Substance Use Topics   Alcohol use: Yes   Drug use: No    Family History Family History  Problem Relation Age of Onset   CAD Mother    Emphysema Father     Allergies  Allergen Reactions   Iodine Rash   Ivp Dye [Iodinated Contrast Media] Hives     REVIEW OF SYSTEMS (Negative unless  checked)  Constitutional: [] Weight loss  [] Fever  [] Chills Cardiac: [] Chest pain   [] Chest pressure   [] Palpitations   [] Shortness of breath when laying flat   [] Shortness of breath at rest   [] Shortness of breath with exertion. Vascular:  [] Pain in legs with walking   [] Pain in legs at rest   [] Pain in legs when laying flat   [] Claudication   [] Pain in feet when walking  [x] Pain in feet at rest  [] Pain in feet when laying flat   [] History of DVT   [] Phlebitis   [] Swelling in legs   [] Varicose veins   [] Non-healing ulcers Pulmonary:   [] Uses home oxygen   [] Productive cough   [] Hemoptysis   [] Wheeze  [] COPD   [] Asthma Neurologic:  [] Dizziness  [] Blackouts   [] Seizures   [] History of stroke   [] History of TIA  [] Aphasia   [] Temporary blindness   [] Dysphagia   [] Weakness or numbness in arms   [] Weakness or numbness in legs Musculoskeletal:  [] Arthritis   [] Joint swelling   [] Joint pain   [] Low back pain Hematologic:  [] Easy bruising  [] Easy bleeding   [] Hypercoagulable state   [] Anemic  [] Hepatitis Gastrointestinal:  [] Blood in stool   [] Vomiting blood  [] Gastroesophageal reflux/heartburn   [] Difficulty swallowing. Genitourinary:  [] Chronic kidney disease   [] Difficult urination  [] Frequent urination  [] Burning with urination   [] Blood in urine Skin:  [] Rashes   [] Ulcers   [] Wounds Psychological:  [] History of anxiety   []  History of major depression.  Physical Examination  Vitals:   09/07/21 1130 09/07/21 1300 09/07/21 1311 09/07/21 1443  BP: (!) 126/52 (!) 150/88  (!) 141/70  Pulse: (!) 56 (!) 141 68 76  Resp: 14 13 17 18   Temp:    98.3 F (36.8 C)  TempSrc:      SpO2: 90% (!) 87% 93% 94%  Weight:      Height:       Body mass index is 18.44 kg/m. Gen:  WD/WN, NAD Head: Wink/AT, No temporalis wasting. Prominent temp pulse not noted. Ear/Nose/Throat: Hearing grossly intact, nares w/o erythema or drainage, oropharynx w/o Erythema/Exudate Eyes: Sclera non-icteric, conjunctiva clear Neck:  Trachea midline.  No JVD.  Pulmonary:  Good air movement, respirations not labored, equal bilaterally.  Cardiac: RRR, normal S1, S2. Vascular: Nonpalpable pulses bilaterally.  Left toes cool.  Continues to have motor function in left foot.  Gastrointestinal: soft, non-tender/non-distended. No guarding/reflex.  Musculoskeletal: M/S 5/5 throughout.  Extremities without  ischemic changes.  No deformity or atrophy. No edema.  Psychiatric: Judgment intact, Mood & affect appropriate for pt's clinical situation.    CBC Lab Results  Component Value Date   WBC 5.1 09/06/2021   HGB 12.1 (L) 09/06/2021   HCT 38.1 (L) 09/06/2021   MCV 90.7 09/06/2021   PLT 205 09/06/2021    BMET    Component Value Date/Time   NA 141 09/06/2021 2335   K 4.4 09/06/2021 2335   CL 107 09/06/2021 2335   CO2 27 09/06/2021 2335   GLUCOSE 136 (H) 09/06/2021 2335   BUN 16 09/06/2021 2335   CREATININE 1.42 (H) 09/06/2021 2335   CALCIUM 9.4 09/06/2021 2335   GFRNONAA 56 (L) 09/06/2021 2335   GFRAA >60 01/11/2018 0356   Estimated Creatinine Clearance: 41.4 mL/min (A) (by C-G formula based on SCr of 1.42 mg/dL (H)).  COAG Lab Results  Component Value Date   INR 1.0 09/06/2021   INR 1.2 10/26/2020   INR 0.8 01/20/2020    Radiology No results found.    Assessment/Plan 1.  Left lower extremity ischemia  Likely an acute occlusion due to medication noncompliance.  The patient does have a severe contrast allergy so we will ensure that he has the 13-hour contrast protocol implemented.  Will also hydrate the patient prior to intervention due to concern for possible acute kidney injury.  Discussed with patient that he will need to stop smoking.  We also stressed the importance of compliance with blood thinners.  Will plan on angiogram tomorrow.  Continue heparin infusion.  Continue statin.  Patient will be n.p.o. at midnight.  2.  Hypertension Continue outpatient medication  3.  Acute kidney injury Will  closely continue to monitor creatinine in setting of contrast interventions.  Family Communication: None at bedside  Thank you for allowing [place] to participate in the care of this patient.   Georgiana Spinner, NP [facility] [phone] (Office Phone) [phone] (Office Fax) [phone] (Pager)  09/07/2021 2:49 PM  Staff may message me via secure chat in Epic  but this may not receive immediate response,  please page for urgent matters!  Dictation software was used to generate the above note. Typos may occur and escape review, as with typed/written notes. Any error is purely unintentional.  Please contact me directly for clarity if needed.

## 2021-09-07 NOTE — ED Notes (Signed)
Pt desatted after morphine administration. Placed on 2L [place]

## 2021-09-07 NOTE — ED Notes (Signed)
ED Provider at bedside using doppler to attempt to find pedal pulses

## 2021-09-07 NOTE — Assessment & Plan Note (Addendum)
-  Nicotine patch 

## 2021-09-07 NOTE — Assessment & Plan Note (Signed)
Versus chronic kidney disease stage IIIa.  Continue IV fluid hydration and continue to monitor creatinine.  I suspect acute kidney injury but will recheck creatinine tomorrow.

## 2021-09-07 NOTE — Progress Notes (Signed)
End of shift note:  Pt was admitted for left lower leg ischemia. The plan is for the pt to be NPO after midnight for an angiogram. Pt remained on a heparin drip and continues IVF. Pt's leg remained warm to touch but RN was unable to find pulses on the left foot. MD is aware. Prn IV morphine was given for pain.

## 2021-09-07 NOTE — Progress Notes (Signed)
ANTICOAGULATION CONSULT NOTE  Pharmacy Consult for heparin infusion Indication: VTE Tx  Allergies  Allergen Reactions   Iodine Rash   Ivp Dye [Iodinated Contrast Media] Hives    Patient Measurements: Height: 5\' 8"  (172.7 cm) Weight: 55 kg (121 lb 4.1 oz) IBW/kg (Calculated) : 68.4 Heparin Dosing Weight: 55 kg  Vital Signs: Temp: 100.3 F (37.9 C) (08/07 2325) Temp Source: Oral (08/07 2325) BP: 149/79 (08/07 2325) Pulse Rate: 118 (08/07 2325)  Labs: Recent Labs    09/06/21 2335  HGB 12.1*  HCT 38.1*  PLT 205  CREATININE 1.42*    Estimated Creatinine Clearance: 41.4 mL/min (A) (by C-G formula based on SCr of 1.42 mg/dL (H)).   Medical History: Past Medical History:  Diagnosis Date   DVT (deep venous thrombosis) (HCC)    Hypertension    Mediations: PTA meds:  Apixaban 5 mg BID  Assessment: Pt is a 61 yo male presenting to ED with hx of PAD, bypass, & multiple stent placements c/o leg pain & swelling being started on heparin for critical limb ischemia of left lower extremity.  Goal of Therapy:  Heparin level 0.3-0.7 units/ml Monitor platelets by anticoagulation protocol: Yes   Plan:  Bolus 3300 units x 1 Start heparin infusion at 900 units/hr BL HL < 0.1, will follow HL Will check HL in 6 hr after start of infusion CBC daily while on heparin  62, PharmD, [facility] 09/07/2021 12:26 AM

## 2021-09-07 NOTE — Progress Notes (Signed)
ANTICOAGULATION CONSULT NOTE  Pharmacy Consult for heparin infusion Indication: VTE Treatment  Allergies  Allergen Reactions   Iodine Rash   Ivp Dye [Iodinated Contrast Media] Hives    Patient Measurements: Height: 5\' 8"  (172.7 cm) Weight: 55 kg (121 lb 4.1 oz) IBW/kg (Calculated) : 68.4 Heparin Dosing Weight: 55 kg  Vital Signs: Temp: 97.9 F (36.6 C) (08/08 2048) BP: 179/63 (08/08 2048) Pulse Rate: 71 (08/08 2048)  Labs: Recent Labs    09/06/21 2335 09/07/21 0021 09/07/21 0615 09/07/21 1513 09/07/21 2156  HGB 12.1*  --   --   --   --   HCT 38.1*  --   --   --   --   PLT 205  --   --   --   --   LABPROT 13.5  --   --   --   --   INR 1.0  --   --   --   --   HEPARINUNFRC  --    < > 0.29* 0.59 0.58  CREATININE 1.42*  --   --   --   --    < > = values in this interval not displayed.     Estimated Creatinine Clearance: 41.4 mL/min (A) (by C-G formula based on SCr of 1.42 mg/dL (H)).   Medical History: Past Medical History:  Diagnosis Date   DVT (deep venous thrombosis) (HCC)    Hypertension    Mediations: PTA meds:  Apixaban 5 mg BID  Assessment: Pt is a 61 yo male presenting to ED with hx of PAD, bypass, & multiple stent placements c/o leg pain & swelling being started on heparin for critical limb ischemia of left lower extremity.  Goal of Therapy:  Heparin level 0.3-0.7 units/ml Monitor platelets by anticoagulation protocol: Yes   Plan: heparin level therapeutic continue heparin infusion at 1100 units/hr Recheck HL daily w/ AM labs while therapeutic CBC daily  62, PharmD, [facility] 09/07/2021 10:24 PM

## 2021-09-08 ENCOUNTER — Encounter
Admission: EM | Disposition: A | Discharge status: Home / Self Care | Payer: Self-pay | Source: Home / Self Care | Attending: Internal Medicine

## 2021-09-08 ENCOUNTER — Encounter: Payer: Self-pay | Admitting: Internal Medicine

## 2021-09-08 DIAGNOSIS — F17200 Nicotine dependence, unspecified, uncomplicated: Secondary | ICD-10-CM | POA: Diagnosis not present

## 2021-09-08 DIAGNOSIS — B1920 Unspecified viral hepatitis C without hepatic coma: Secondary | ICD-10-CM

## 2021-09-08 DIAGNOSIS — J439 Emphysema, unspecified: Secondary | ICD-10-CM

## 2021-09-08 DIAGNOSIS — I70222 Atherosclerosis of native arteries of extremities with rest pain, left leg: Secondary | ICD-10-CM | POA: Diagnosis not present

## 2021-09-08 DIAGNOSIS — E875 Hyperkalemia: Secondary | ICD-10-CM

## 2021-09-08 DIAGNOSIS — I10 Essential (primary) hypertension: Secondary | ICD-10-CM | POA: Diagnosis not present

## 2021-09-08 DIAGNOSIS — N179 Acute kidney failure, unspecified: Secondary | ICD-10-CM | POA: Diagnosis not present

## 2021-09-08 HISTORY — PX: LOWER EXTREMITY ANGIOGRAPHY: CATH118251

## 2021-09-08 LAB — BASIC METABOLIC PANEL
Anion gap: 4 — ABNORMAL LOW (ref 5–15)
BUN: 15 mg/dL (ref 8–23)
CO2: 24 mmol/L (ref 22–32)
Calcium: 9.2 mg/dL (ref 8.9–10.3)
Chloride: 111 mmol/L (ref 98–111)
Creatinine, Ser: 1 mg/dL (ref 0.61–1.24)
GFR, Estimated: >60 mL/min (ref >60)
Glucose, Bld: 127 mg/dL — ABNORMAL HIGH (ref 70–99)
Potassium: 5 mmol/L (ref 3.5–5.1)
Sodium: 139 mmol/L (ref 135–145)

## 2021-09-08 LAB — CBC
HCT: 36.3 % — ABNORMAL LOW (ref 39.0–52.0)
Hemoglobin: 11.6 g/dL — ABNORMAL LOW (ref 13.0–17.0)
MCH: 29.1 pg (ref 26.0–34.0)
MCHC: 32 g/dL (ref 30.0–36.0)
MCV: 91.2 fL (ref 80.0–100.0)
Platelets: 185 10*3/uL (ref 150–400)
RBC: 3.98 MIL/uL — ABNORMAL LOW (ref 4.22–5.81)
RDW: 12.8 % (ref 11.5–15.5)
WBC: 3.9 10*3/uL — ABNORMAL LOW (ref 4.0–10.5)
nRBC: 0 % (ref 0.0–0.2)

## 2021-09-08 LAB — HEPARIN LEVEL (UNFRACTIONATED): Heparin Unfractionated: 0.44 IU/mL (ref 0.30–0.70)

## 2021-09-08 SURGERY — LOWER EXTREMITY ANGIOGRAPHY
Anesthesia: Moderate Sedation | Laterality: Left

## 2021-09-08 MED ORDER — DIPHENHYDRAMINE HCL 50 MG/ML IJ SOLN
50.0000 mg | Freq: Once | INTRAMUSCULAR | Status: AC | PRN
  Administered 2021-09-08: 50 mg via INTRAVENOUS

## 2021-09-08 MED ORDER — CEFAZOLIN SODIUM-DEXTROSE 2-4 GM/100ML-% IV SOLN
2.0000 g | INTRAVENOUS | Status: AC
  Filled 2021-09-08: qty 100

## 2021-09-08 MED ORDER — FENTANYL CITRATE PF 50 MCG/ML IJ SOSY
PREFILLED_SYRINGE | INTRAMUSCULAR | Status: AC
  Filled 2021-09-08: qty 1

## 2021-09-08 MED ORDER — CEFAZOLIN SODIUM-DEXTROSE 2-4 GM/100ML-% IV SOLN
INTRAVENOUS | Status: AC
  Administered 2021-09-08: 2 g via INTRAVENOUS
  Filled 2021-09-08: qty 100

## 2021-09-08 MED ORDER — MIDAZOLAM HCL 2 MG/ML PO SYRP: 8.0000 mg | ORAL_SOLUTION | Freq: Once | ORAL | Status: DC | PRN

## 2021-09-08 MED ORDER — ONDANSETRON HCL 4 MG/2ML IJ SOLN: 4.0000 mg | Freq: Four times a day (QID) | INTRAMUSCULAR | Status: DC | PRN

## 2021-09-08 MED ORDER — AMLODIPINE BESYLATE 5 MG PO TABS
5.0000 mg | ORAL_TABLET | Freq: Every day | ORAL | Status: DC
  Administered 2021-09-08 – 2021-09-10 (×2): 5 mg via ORAL
  Filled 2021-09-08 (×3): qty 1

## 2021-09-08 MED ORDER — MIDAZOLAM HCL 5 MG/5ML IJ SOLN
INTRAMUSCULAR | Status: AC
  Filled 2021-09-08: qty 5

## 2021-09-08 MED ORDER — STERILE WATER FOR INJECTION IJ SOLN
INTRAMUSCULAR | Status: AC
  Filled 2021-09-08: qty 10

## 2021-09-08 MED ORDER — DIPHENHYDRAMINE HCL 50 MG/ML IJ SOLN
INTRAMUSCULAR | Status: AC
  Filled 2021-09-08: qty 1

## 2021-09-08 MED ORDER — FAMOTIDINE 20 MG PO TABS
ORAL_TABLET | ORAL | Status: AC
  Filled 2021-09-08: qty 2

## 2021-09-08 MED ORDER — LABETALOL HCL 5 MG/ML IV SOLN
INTRAVENOUS | Status: AC
  Filled 2021-09-08: qty 4

## 2021-09-08 MED ORDER — LABETALOL HCL 5 MG/ML IV SOLN
INTRAVENOUS | Status: DC | PRN
  Administered 2021-09-08: 20 mg via INTRAVENOUS

## 2021-09-08 MED ORDER — OXYCODONE HCL 5 MG PO TABS
5.0000 mg | ORAL_TABLET | ORAL | Status: DC | PRN
  Administered 2021-09-08 – 2021-09-10 (×6): 5 mg via ORAL
  Filled 2021-09-08 (×7): qty 1

## 2021-09-08 MED ORDER — HYDROMORPHONE HCL 1 MG/ML IJ SOLN
1.0000 mg | Freq: Once | INTRAMUSCULAR | Status: DC | PRN
  Filled 2021-09-08: qty 1

## 2021-09-08 MED ORDER — FENTANYL CITRATE (PF) 100 MCG/2ML IJ SOLN
INTRAMUSCULAR | Status: AC
  Filled 2021-09-08: qty 2

## 2021-09-08 MED ORDER — FENTANYL CITRATE (PF) 100 MCG/2ML IJ SOLN
INTRAMUSCULAR | Status: DC | PRN
  Administered 2021-09-08 (×4): 25 ug via INTRAVENOUS
  Administered 2021-09-08 (×2): 50 ug via INTRAVENOUS

## 2021-09-08 MED ORDER — SODIUM CHLORIDE 0.9 % IV SOLN
INTRAVENOUS | Status: DC
Start: 2021-09-08 — End: 2021-09-08

## 2021-09-08 MED ORDER — APIXABAN 5 MG PO TABS
5.0000 mg | ORAL_TABLET | Freq: Two times a day (BID) | ORAL | Status: DC
  Administered 2021-09-08 – 2021-09-10 (×4): 5 mg via ORAL
  Filled 2021-09-08 (×4): qty 1

## 2021-09-08 MED ORDER — MIDAZOLAM HCL 2 MG/2ML IJ SOLN
INTRAMUSCULAR | Status: DC | PRN
  Administered 2021-09-08 (×2): 1 mg via INTRAVENOUS
  Administered 2021-09-08: .5 mg via INTRAVENOUS
  Administered 2021-09-08: 2 mg via INTRAVENOUS
  Administered 2021-09-08: .5 mg via INTRAVENOUS

## 2021-09-08 MED ORDER — HEPARIN SODIUM (PORCINE) 1000 UNIT/ML IJ SOLN
INTRAMUSCULAR | Status: AC
  Filled 2021-09-08: qty 10

## 2021-09-08 MED ORDER — HEPARIN SODIUM (PORCINE) 1000 UNIT/ML IJ SOLN
INTRAMUSCULAR | Status: DC | PRN
  Administered 2021-09-08: 4000 [IU] via INTRAVENOUS

## 2021-09-08 MED ORDER — ALTEPLASE 1 MG/ML SYRINGE FOR VASCULAR PROCEDURE
INTRAMUSCULAR | Status: DC | PRN
  Administered 2021-09-08: 6 mg via INTRA_ARTERIAL

## 2021-09-08 MED ORDER — FAMOTIDINE 20 MG PO TABS
40.0000 mg | ORAL_TABLET | Freq: Once | ORAL | Status: AC | PRN
  Administered 2021-09-08: 40 mg via ORAL

## 2021-09-08 MED ORDER — ALTEPLASE 2 MG IJ SOLR
INTRAMUSCULAR | Status: AC
  Filled 2021-09-08: qty 6

## 2021-09-08 MED ORDER — HYDROMORPHONE HCL 1 MG/ML IJ SOLN
1.0000 mg | INTRAMUSCULAR | Status: DC | PRN
  Administered 2021-09-08 – 2021-09-09 (×5): 1 mg via INTRAVENOUS
  Filled 2021-09-08 (×4): qty 1

## 2021-09-08 MED ORDER — METHYLPREDNISOLONE SODIUM SUCC 125 MG IJ SOLR
INTRAMUSCULAR | Status: AC
  Filled 2021-09-08: qty 2

## 2021-09-08 MED ORDER — METHYLPREDNISOLONE SODIUM SUCC 125 MG IJ SOLR
125.0000 mg | Freq: Once | INTRAMUSCULAR | Status: AC | PRN
  Administered 2021-09-08: 125 mg via INTRAVENOUS

## 2021-09-08 MED ORDER — IODIXANOL 320 MG/ML IV SOLN
INTRAVENOUS | Status: DC | PRN
  Administered 2021-09-08: 75 mL

## 2021-09-08 SURGICAL SUPPLY — 26 items
BALLN DORADO 7X40X80 (BALLOONS) ×2
BALLN LUTONIX DCB 5X40X130 (BALLOONS) ×2
BALLN LUTONIX DCB 6X60X130 (BALLOONS) ×2
BALLN ULTRV 018 6X40X75 (BALLOONS) ×2
BALLOON DORADO 7X40X80 (BALLOONS) IMPLANT
BALLOON LUTONIX DCB 5X40X130 (BALLOONS) IMPLANT
BALLOON LUTONIX DCB 6X60X130 (BALLOONS) IMPLANT
BALLOON ULTRV 018 6X40X75 (BALLOONS) IMPLANT
CANISTER PENUMBRA ENGINE (MISCELLANEOUS) ×1 IMPLANT
CANNULA 5F STIFF (CANNULA) ×1 IMPLANT
CATH ANGIO 5F PIGTAIL 65CM (CATHETERS) ×1 IMPLANT
CATH BEACON 5 .035 40 KMP TP (CATHETERS) IMPLANT
CATH BEACON 5 .038 40 KMP TP (CATHETERS) ×2
CATH INDIGO 7D KIT (CATHETERS) ×1 IMPLANT
COVER PROBE U/S 5X48 (MISCELLANEOUS) ×1 IMPLANT
DEVICE SAFEGUARD 24CM (GAUZE/BANDAGES/DRESSINGS) ×1 IMPLANT
DEVICE STARCLOSE SE CLOSURE (Vascular Products) ×1 IMPLANT
GLIDEWIRE ADV .035X260CM (WIRE) ×1 IMPLANT
KIT ENCORE 26 ADVANTAGE (KITS) ×1 IMPLANT
PACK ANGIOGRAPHY (CUSTOM PROCEDURE TRAY) ×2 IMPLANT
SHEATH BRITE TIP 5FRX11 (SHEATH) ×1 IMPLANT
SHEATH BRITE TIP 7FRX11 (SHEATH) ×1 IMPLANT
STENT VIABAHN 7X50X120 (Permanent Stent) ×2 IMPLANT
STENT VIABAHN 7X5X120 7FR (Permanent Stent) IMPLANT
SYR MEDRAD MARK 7 150ML (SYRINGE) ×1 IMPLANT
WIRE GUIDERIGHT .035X150 (WIRE) ×1 IMPLANT

## 2021-09-08 NOTE — Progress Notes (Signed)
ANTICOAGULATION CONSULT NOTE  Pharmacy Consult for heparin infusion Indication: VTE Treatment  Allergies  Allergen Reactions   Iodine Rash   Ivp Dye [Iodinated Contrast Media] Hives    Patient Measurements: Height: 5\' 8"  (172.7 cm) Weight: 55 kg (121 lb 4.1 oz) IBW/kg (Calculated) : 68.4 Heparin Dosing Weight: 55 kg  Vital Signs: Temp: 98.3 F (36.8 C) (08/09 0429) BP: 168/63 (08/09 0429) Pulse Rate: 65 (08/09 0429)  Labs: Recent Labs    09/06/21 2335 09/07/21 0021 09/07/21 1513 09/07/21 2156 09/08/21 0444  HGB 12.1*  --   --   --  11.6*  HCT 38.1*  --   --   --  36.3*  PLT 205  --   --   --  185  LABPROT 13.5  --   --   --   --   INR 1.0  --   --   --   --   HEPARINUNFRC  --    < > 0.59 0.58 0.44  CREATININE 1.42*  --   --   --  1.00   < > = values in this interval not displayed.     Estimated Creatinine Clearance: 58.8 mL/min (by C-G formula based on SCr of 1 mg/dL).   Medical History: Past Medical History:  Diagnosis Date   DVT (deep venous thrombosis) (HCC)    Hypertension    Mediations: PTA meds:  Apixaban 5 mg BID  Assessment: Pt is a 61 yo male presenting to ED with hx of PAD, bypass, & multiple stent placements c/o leg pain & swelling being started on heparin for critical limb ischemia of left lower extremity.  Goal of Therapy:  Heparin level 0.3-0.7 units/ml Monitor platelets by anticoagulation protocol: Yes   Plan: heparin level therapeutic continue heparin infusion at 1100 units/hr Recheck HL daily w/ AM labs while therapeutic CBC daily  62, PharmD, [facility] 09/08/2021 5:48 AM

## 2021-09-08 NOTE — Interval H&P Note (Signed)
History and Physical Interval Note:  09/08/2021 9:11 AM  Christopher Bryan.  has presented today for surgery, with the diagnosis of Left lower extremity ischemia.  The various methods of treatment have been discussed with the patient and family. After consideration of risks, benefits and other options for treatment, the patient has consented to  Procedure(s): Lower Extremity Angiography (Left) as a surgical intervention.  The patient's history has been reviewed, patient examined, no change in status, stable for surgery.  I have reviewed the patient's chart and labs.  Questions were answered to the patient's satisfaction.     Festus Barren

## 2021-09-08 NOTE — Progress Notes (Signed)
ANTICOAGULATION CONSULT NOTE  Pharmacy Consult for transition from heparin to Eliquis Indication: Critical limb ischemia of left lower extremity  Allergies  Allergen Reactions   Iodine Rash   Ivp Dye [Iodinated Contrast Media] Hives    Patient Measurements: Height: 5\' 8"  (172.7 cm) Weight: 55 kg (121 lb 4.1 oz) IBW/kg (Calculated) : 68.4 Heparin Dosing Weight: 55 kg  Vital Signs: Temp: 98.5 F (36.9 C) (08/09 1226) Temp Source: Oral (08/09 1226) BP: 152/68 (08/09 1226) Pulse Rate: 63 (08/09 1226)  Labs: Recent Labs    09/06/21 2335 09/07/21 0021 09/07/21 1513 09/07/21 2156 09/08/21 0444  HGB 12.1*  --   --   --  11.6*  HCT 38.1*  --   --   --  36.3*  PLT 205  --   --   --  185  LABPROT 13.5  --   --   --   --   INR 1.0  --   --   --   --   HEPARINUNFRC  --    < > 0.59 0.58 0.44  CREATININE 1.42*  --   --   --  1.00   < > = values in this interval not displayed.     Estimated Creatinine Clearance: 58.8 mL/min (by C-G formula based on SCr of 1 mg/dL).   Medical History: Past Medical History:  Diagnosis Date   DVT (deep venous thrombosis) (HCC)    Hypertension    Mediations: PTA meds:  Apixaban 5 mg BID  Assessment: Pt is a 61 yo male presenting to ED with hx of PAD, bypass, & multiple stent placements c/o leg pain & swelling who has been heparin since 8/8 for critical limb ischemia of left lower extremity. They are now transitioning back to Eliquis.  Plan: Transitioning to Eliquis Discontinue heparin drip at time of Eliquis administration Eliquis dose timed for 2200 and heparin end time set at 2200  10/8, PharmD PGY-1 Pharmacy Resident 09/08/2021 1:21 PM

## 2021-09-08 NOTE — Op Note (Signed)
[facility]  Percutaneous Study/Intervention Procedural Note   Date of Surgery: 09/08/2021  Surgeon(s):Christopher Bryan    Assistants:none  Pre-operative Diagnosis: PAD with rest [name], occluded femoral to femoral bypass  Post-operative diagnosis:  Same  Procedure(s) Performed:             1.  Ultrasound guidance for vascular access right superficial femoral artery             2.  Catheter placement into left profunda femorus artery from right femoral approach             3.  Aortogram and selective left lower extremity angiogram             4.  mechanical thrombectomy of the left profunda femoris artery and the femoral-femoral bypass using the penumbra CAT 7D catheter             5.  Percutaneous transluminal angioplasty of the left femoral-femoral bypass anastomosis to the common femoral artery and into the profunda femoris artery with 5 and 6 mm diameter angioplasty balloons  6.  Stent placement to the left femoral-femoral bypass anastomosis to the common femoral artery with a 7 mm diameter by 5 cm length Viabahn stent  7.  Angioplasty of the right femoral-femoral bypass to the common femoral artery with 6 mm diameter by 6 cm length Lutonix drug-coated angioplasty balloon             8. StarClose closure device right superficial femoral artery  EBL: 200 cc  Contrast: 75 cc  Fluoro Time: 14.5 minutes  Moderate Conscious Sedation Time: approximately 73 minutes using 5 mg of Versed and 200 mcg of Fentanyl              Indications:  Patient is a 61 y.o.male with acute on chronic ischemia left lower extremity with rest Bryan and a known history of a femoral to femoral bypass which was now clinically occluded.  The patient is brought in for angiography for further evaluation and potential treatment.  Due to the limb threatening nature of the situation, angiogram was performed for attempted limb salvage. The patient is aware that if the procedure fails, amputation would  be expected.  The patient also understands that even with successful revascularization, amputation may still be required due to the severity of the situation.  Risks and benefits are discussed and informed consent is obtained.   Procedure:  The patient was identified and appropriate procedural time out was performed.  The patient was then placed supine on the table and prepped and draped in the usual sterile fashion. Moderate conscious sedation was administered during a face to face encounter with the patient throughout the procedure with my supervision of the RN administering medicines and monitoring the patient's vital signs, pulse oximetry, telemetry and mental status throughout from the start of the procedure until the patient was taken to the recovery room. Ultrasound was used to evaluate the right common femoral artery and proximal superficial femoral artery.  It was patent .  A digital ultrasound image was acquired.  A Seldinger needle was used to access the proximal right superficial femoral artery under direct ultrasound guidance and a permanent image was performed.  A 0.035 J wire was advanced without resistance and a 5Fr sheath was placed.  Pigtail catheter was placed into the aorta and an AP aortogram was performed. This demonstrated normal renal arteries and normal aorta.  The right iliac arteries had been previously stented and were  now widely patent.  The left iliac arteries both common and external were occluded with large collaterals. I then imaged to the right femoral sheath and showed the femoral to femoral bypass to be occluded.  Tediously, using a Kumpe catheter and advantage wire was able to get into the bypass and eventually across the bypass and into the profunda femoris artery.  There was a previously placed stent in the common femoral artery going into the origin of the profunda femoris artery from his previous revascularization was extremely difficult a couple of years ago.  He has  already had surgical therapy on the side and is a very noncompliant patient making this an extremely difficult situation.  The profunda femoris artery reconstituted through collaterals after proximal occlusion from thrombus and was providing collateral blood flow is the only runoff distally.  It was felt that it was in the patient's best interest to proceed with intervention after these images to avoid a second procedure and a larger amount of contrast and fluoroscopy based off of the findings from the initial angiogram. The patient was systemically heparinized and I upsized to a short 7 [place] sheath.  6 mg of tPA were then given into the occluded femoral to femoral bypass through the sheath.  I then brought the penumbra CAT 7D catheter onto the field for mechanical thrombectomy and the femoral to femoral bypass in the left profunda femoris artery.  Multiple passes were made which resulted in marked improvement although there remain thrombus and stenosis within the previously placed stent in the left common femoral artery into the origin of the profunda femoris artery.  There also appeared to be some thrombus and stenosis in the 60% range in the proximal portion of the bypass just beyond the right femoral artery anastomosis.  The stent in the left common femoral artery also had deformation that was readily seen after thrombectomy and it was clear that a new covered stent would have to place in this area.  Balloon angioplasty was performed with a 5 and a 6 mm diameter angioplasty balloon to open this channel and allow a covered stent to be placed.  A 7 mm diameter by 5 cm length Viabahn stent was then deployed in the distal portion of the right to left femoral to femoral bypass across the bypass anastomosis and into the proximal left profunda femoris artery.  This is postdilated with a 7 mm balloon with excellent angiographic completion result and less than 10% residual stenosis.  There is still thrombus seen in the  profunda femoris artery and another pass was made with the penumbra CAT 7D catheter which resolved the thrombus in the profunda femoris artery.  I then elected to treat the area near the right femoral anastomosis of the femoral to femoral bypass with a 6 mm diameter by 6 cm length Lutonix drug-coated angioplasty balloon in the mid back into the common femoral artery and then up into the proximal portion of the bypass graft.  This was inflated to 8 atm for 1 minute.  Completion imaging showed only about a 20% residual stenosis and there is now good flow through the graft.  I elected to terminate the procedure. The sheath was removed and StarClose closure device was deployed in the right superficial femoral artery with excellent hemostatic result. The patient was taken to the recovery room in stable condition having tolerated the procedure well.  Findings:               Aortogram:  This  demonstrated normal renal arteries and normal aorta.  The right iliac arteries had been previously stented and were now widely patent.  The left iliac arteries both common and external were occluded with large collaterals.             Left lower Extremity: This demonstrated occlusion of the femoral-femoral bypass and once we crossed this the previously placed stent in the common femoral artery appeared to go into the origin of the profunda femoris artery and this was occluded with reconstitution of the profunda femoris artery beyond this through collaterals.  This provided the only flow distally.  The SFA was chronically occluded and the stent location would make this difficult or impossible to consider recanalization.   Disposition: Patient was taken to the recovery room in stable condition having tolerated the procedure well.  Complications: None  Christopher Bryan 09/08/2021 11:20 AM   This note was created with Dragon Medical transcription system. Any errors in dictation are purely unintentional.

## 2021-09-08 NOTE — Assessment & Plan Note (Signed)
Resolved.  Potassium 3.8.

## 2021-09-08 NOTE — Assessment & Plan Note (Signed)
As per records from the [place], hepatitis C antibody was positive and RNA testing negative.

## 2021-09-08 NOTE — Progress Notes (Signed)
  Progress Note   Patient: Christopher Bryan. ZOX:[id number] DOB: 11/26/1960 DOA: 09/07/2021     1 DOS: the patient was seen and examined on 09/08/2021     Assessment and Plan: * Critical limb ischemia of left lower extremity (HCC) Severe PAD s/p  fem-fem bypass and multiple stent placements Heparin infusion.  Continue atorvastatin.  Pain control.  Discussed compliance with blood thinners.  Must stop smoking.  Angiogram today.  Patient in severe pain this morning prior to angiogram switch pain meds to Dilaudid and oxycodone.   AKI (acute kidney injury) (HCC) Acute kidney injury.  Creatinine 1.42 on presentation down to 1.0 with IV fluid hydration.  Recheck creatinine tomorrow after angiogram.  Hypertension Continue carvedilol.  Hold lisinopril secondary to hyperkalemia.  Start Norvasc instead.  Nicotine dependence Nicotine patch  Hepatitis C virus infection As per records from the [place], hepatitis C antibody was positive and RNA testing negative.  Hyperkalemia Hold lisinopril today.  Recheck potassium tomorrow morning.  COPD (chronic obstructive pulmonary disease) with emphysema (HCC) Continue Symbicort.  DuoNebs as needed  Chronic diastolic CHF (congestive heart failure) (HCC) Euvolemic.  Watch closely with IV fluids. Continue lisinopril and carvedilol        Subjective: Called by nursing staff that the patient had severe pain left lower extremity this morning.  Admitted with limb threatening ischemia left lower extremity.  For angiogram today.  Physical Exam: Vitals:   09/08/21 1056 09/08/21 1059 09/08/21 1101 09/08/21 1115  BP:   (!) 175/87 (!) 176/65  Pulse: 67  69 62  Resp: 18  20 18   Temp:      TempSrc:      SpO2: 99% 99%  96%  Weight:      Height:        Data Reviewed: Reviewed records from care everywhere and patient does have a history of hepatitis C with antibody being positive but RNA was negative.  White blood cell count 3.9, hemoglobin 11.6, creatinine  1.0, potassium 5.0.  Family Communication: Declined  Disposition: Status is: Inpatient Remains inpatient appropriate because: Angiogram today.  Required increasing pain medications this morning.  Planned Discharge Destination: Home    Time spent: 28 minutes Case discussed with nursing staff  Author: , MD 09/08/2021 11:50 AM  For on call review [email or web address].

## 2021-09-09 ENCOUNTER — Encounter: Payer: Self-pay | Admitting: Vascular Surgery

## 2021-09-09 DIAGNOSIS — I70222 Atherosclerosis of native arteries of extremities with rest pain, left leg: Secondary | ICD-10-CM | POA: Diagnosis not present

## 2021-09-09 DIAGNOSIS — I998 Other disorder of circulatory system: Secondary | ICD-10-CM | POA: Diagnosis not present

## 2021-09-09 DIAGNOSIS — N179 Acute kidney failure, unspecified: Secondary | ICD-10-CM | POA: Diagnosis not present

## 2021-09-09 DIAGNOSIS — E875 Hyperkalemia: Secondary | ICD-10-CM | POA: Diagnosis not present

## 2021-09-09 DIAGNOSIS — I10 Essential (primary) hypertension: Secondary | ICD-10-CM | POA: Diagnosis not present

## 2021-09-09 LAB — BASIC METABOLIC PANEL
Anion gap: 5 (ref 5–15)
BUN: 20 mg/dL (ref 8–23)
CO2: 25 mmol/L (ref 22–32)
Calcium: 8.4 mg/dL — ABNORMAL LOW (ref 8.9–10.3)
Chloride: 109 mmol/L (ref 98–111)
Creatinine, Ser: 0.96 mg/dL (ref 0.61–1.24)
GFR, Estimated: >60 mL/min (ref >60)
Glucose, Bld: 115 mg/dL — ABNORMAL HIGH (ref 70–99)
Potassium: 3.8 mmol/L (ref 3.5–5.1)
Sodium: 139 mmol/L (ref 135–145)

## 2021-09-09 LAB — CBC
HCT: 31.2 % — ABNORMAL LOW (ref 39.0–52.0)
Hemoglobin: 10.2 g/dL — ABNORMAL LOW (ref 13.0–17.0)
MCH: 29.1 pg (ref 26.0–34.0)
MCHC: 32.7 g/dL (ref 30.0–36.0)
MCV: 89.1 fL (ref 80.0–100.0)
Platelets: 157 10*3/uL (ref 150–400)
RBC: 3.5 MIL/uL — ABNORMAL LOW (ref 4.22–5.81)
RDW: 12.7 % (ref 11.5–15.5)
WBC: 5.7 10*3/uL (ref 4.0–10.5)
nRBC: 0 % (ref 0.0–0.2)

## 2021-09-09 LAB — URIC ACID: Uric Acid, Serum: 5.4 mg/dL (ref 3.7–8.6)

## 2021-09-09 MED ORDER — CLOPIDOGREL BISULFATE 75 MG PO TABS: 75.0000 mg | ORAL_TABLET | Freq: Every day | ORAL | 0 refills | Status: DC

## 2021-09-09 MED ORDER — OXYCODONE HCL 5 MG PO TABS: 5.0000 mg | ORAL_TABLET | Freq: Four times a day (QID) | ORAL | 0 refills | Status: AC | PRN

## 2021-09-09 MED ORDER — CARBOXYMETHYLCELLULOSE SODIUM 0.5 % OP SOLN: 1.0000 [drp] | Freq: Four times a day (QID) | OPHTHALMIC | 0 refills | Status: AC | PRN

## 2021-09-09 MED ORDER — CLOPIDOGREL BISULFATE 75 MG PO TABS
75.0000 mg | ORAL_TABLET | Freq: Every day | ORAL | Status: DC
  Administered 2021-09-09 – 2021-09-10 (×2): 75 mg via ORAL
  Filled 2021-09-09 (×2): qty 1

## 2021-09-09 MED ORDER — CARVEDILOL 6.25 MG PO TABS: 6.2500 mg | ORAL_TABLET | Freq: Two times a day (BID) | ORAL | 0 refills | Status: DC

## 2021-09-09 MED ORDER — AMLODIPINE BESYLATE 5 MG PO TABS: 5.0000 mg | ORAL_TABLET | Freq: Every day | ORAL | 0 refills | Status: DC

## 2021-09-09 NOTE — Progress Notes (Signed)
Progress Note   Patient: Christopher Bryan. HUT:[id number] DOB: Jun 24, 1960 DOA: 09/07/2021     2 DOS: the patient was seen and examined on 09/09/2021   Brief hospital course: 61 year old man with past medical history of hypertension and peripheral vascular disease presented with left lower extremity ischemia.  He was started on heparin drip.  He was premedicated for dye allergy.  Patient was brought to the vascular lab on 09/08/2021 by Dr. Wyn Quaker and had a mechanical thrombectomy of the left profunda femoris artery and the femoral bypass, angioplasty of the left femoral bypass, stent placement of left femoral bypass, angioplasty of the right femoral bypass the common femoral artery.  Patient was placed back on Eliquis and Plavix.  Patient having left first toe pain on 09/09/2021.  Assessment and Plan: * Critical limb ischemia of left lower extremity (HCC) Severe PAD s/p  fem-fem bypass and multiple stent placements Status post angiogram and procedures by Dr. Wyn Quaker on 09/08/2021.  Patient on Eliquis and Plavix.  Will need close follow-up with vascular surgery as outpatient.  Will stop smoking.  With the patient's to pain he was uncomfortable going home today.  AKI (acute kidney injury) (HCC) Acute kidney injury.  Creatinine 1.42 on presentation down to 0.96.  Hypertension Continue carvedilol.  Hold lisinopril secondary to hyperkalemia.  Continue Norvasc instead.  Nicotine dependence Nicotine patch  Hepatitis C virus infection As per records from the [place], hepatitis C antibody was positive and RNA testing negative.  Hyperkalemia Resolved.  Potassium 3.8.  COPD (chronic obstructive pulmonary disease) with emphysema (HCC) Continue Symbicort.  DuoNebs as needed  Chronic diastolic CHF (congestive heart failure) (HCC) Euvolemic.  Watch closely with IV fluids. Continue lisinopril and carvedilol        Subjective: Patient complains of left first toe pain.  Initially he was okay with going home but  this afternoon changed his mind that he felt uncomfortable driving home with a clutch on his car.  Seen by vascular surgery and they were okay with going home and following up as outpatient.  Admitted with critical left lower extremity ischemia.  Physical Exam: Vitals:   09/08/21 1626 09/08/21 1941 09/09/21 0326 09/09/21 0758  BP: (!) 148/70 (!) 115/52 (!) 149/76 (!) 143/59  Pulse: 64 78 (!) 59 60  Resp: 18 20 19 18   Temp: 98.4 F (36.9 C) 98.6 F (37 C) 98 F (36.7 C) 98 F (36.7 C)  TempSrc: Oral Oral  Oral  SpO2: 97% 96% 100% 100%  Weight:      Height:       Physical Exam HENT:     Head: Normocephalic.     Mouth/Throat:     Pharynx: No oropharyngeal exudate.  Eyes:     General: Lids are normal.     Conjunctiva/sclera: Conjunctivae normal.  Cardiovascular:     Rate and Rhythm: Normal rate and regular rhythm.     Pulses:          Popliteal pulses are 0 on the left side.       Dorsalis pedis pulses are 0 on the left side.       Posterior tibial pulses are 0 on the left side.     Heart sounds: Normal heart sounds, S1 normal and S2 normal.     Comments: Pulses are dopplerable on left lower extremity. Pulmonary:     Breath sounds: No decreased breath sounds, wheezing, rhonchi or rales.  Abdominal:     Palpations: Abdomen is soft.  Tenderness: There is no abdominal tenderness.  Musculoskeletal:     Right lower leg: No swelling.     Left lower leg: No swelling.  Skin:    General: Skin is warm.     Findings: No rash.  Neurological:     Mental Status: He is alert and oriented to person, place, and time.     Data Reviewed: Creatinine 0.96, hemoglobin 10.2  Family Communication: Declined  Disposition: Status is: Inpatient Remains inpatient appropriate because: Patient still having to pain today.  I explained to the patient that I was okay going home with following up with vascular surgery.  Patient felt uncomfortable going home today and wanted to go home tomorrow   Planned Discharge Destination: Home    Time spent: 27 minutes  Author: Alford Highland, MD 09/09/2021 3:54 PM  For on call review [email or web address].

## 2021-09-09 NOTE — Progress Notes (Signed)
[facility]  Daily Progress Note   Subjective  -   Patient notes left leg feels better however he does have some left toe pain  Objective Vitals:   09/08/21 1626 09/08/21 1941 09/09/21 0326 09/09/21 0758  BP: (!) 148/70 (!) 115/52 (!) 149/76 (!) 143/59  Pulse: 64 78 (!) 59 60  Resp: 18 20 19 18   Temp: 98.4 F (36.9 C) 98.6 F (37 C) 98 F (36.7 C) 98 F (36.7 C)  TempSrc: Oral Oral  Oral  SpO2: 97% 96% 100% 100%  Weight:      Height:        Intake/Output Summary (Last 24 hours) at 09/09/2021 1018 Last data filed at 09/09/2021 0341 Gross per 24 hour  Intake 484.92 ml  Output 400 ml  Net 84.92 ml    PULM  CTAB CV  RRR VASC  dopplerable DP/PT pulses, left foot warm  Laboratory CBC    Component Value Date/Time   WBC 5.7 09/09/2021 0429   HGB 10.2 (L) 09/09/2021 0429   HCT 31.2 (L) 09/09/2021 0429   PLT 157 09/09/2021 0429    BMET    Component Value Date/Time   NA 139 09/09/2021 0429   K 3.8 09/09/2021 0429   CL 109 09/09/2021 0429   CO2 25 09/09/2021 0429   GLUCOSE 115 (H) 09/09/2021 0429   BUN 20 09/09/2021 0429   CREATININE 0.96 09/09/2021 0429   CALCIUM 8.4 (L) 09/09/2021 0429   GFRNONAA >60 09/09/2021 0429   GFRAA >60 01/11/2018 0356    Assessment/Planning: POD # 1 s/p left lower extremity angiogram  Left lower extremity ischemia Patient's leg feels much better, however continues to have toe pain.  I suspect this is related to reperfusion changes.  However, there is also the possibility that due to his prolonged decreased perfusion these may be early changes related to tissue death.  He currently does not have any necrotic noted tissue, some hopeful this is not the case.  Will have the patient return with 14/12/2017 in 3 weeks with ABIs in office.  The patient should continue with Plavix and Eliquis.  The use of his medication is stressed. Nicotine dependence Smoking cessation is also stressed as this decreases his longevity in his  vascular interventions.  [place]  09/09/2021, 10:18 AM

## 2021-09-09 NOTE — Hospital Course (Signed)
61 year old man with past medical history of hypertension and peripheral vascular disease presented with left lower extremity ischemia.  He was started on heparin drip.  He was premedicated for dye allergy.  Patient was brought to the vascular lab on 09/08/2021 by Dr. Wyn Quaker and had a mechanical thrombectomy of the left profunda femoris artery and the femoral bypass, angioplasty of the left femoral bypass, stent placement of left femoral bypass, angioplasty of the right femoral bypass the common femoral artery.  Patient was placed back on Eliquis and Plavix.  Patient having left first toe pain on 09/09/2021.

## 2021-09-10 NOTE — Discharge Summary (Signed)
Physician Discharge Summary   Christopher Bryan.  male DOB: May 07, 1960  KCL:[id number]  PCP: [facility]  Admit date: 09/07/2021 Discharge date: 09/10/2021  Admitted From: home Disposition:  home CODE STATUS: Full code  Discharge Instructions     Diet - low sodium heart healthy   Complete by: As directed       Hospital Course:  For full details, please see H&P, progress notes, consult notes and ancillary notes.  Briefly,  Christopher Bryan. Is a 61 year old man with past medical history of hypertension and peripheral vascular disease presented with left lower extremity ischemia.    He was started on heparin drip.  He was premedicated for dye allergy.  Patient was brought to the vascular lab on 09/08/2021 by Dr. Wyn Quaker and had a mechanical thrombectomy of the left profunda femoris artery and the femoral bypass, angioplasty of the left femoral bypass, stent placement of left femoral bypass, angioplasty of the right femoral bypass the common femoral artery.  Patient was placed back on Eliquis and Plavix.     * Critical limb ischemia of left lower extremity (HCC) Severe PAD s/p fem-fem bypass and multiple stent placements Status post angiogram and procedures by Dr. Wyn Quaker on 09/08/2021.  Cont on Eliquis and Plavix.  Rec stop smoking. Close outpatient f/u with vascular surgery scheduled.   AKI (acute kidney injury) (HCC) Acute kidney injury.  Creatinine 1.42 on presentation down to 0.96.   Hypertension Continue carvedilol and amlodipine Hold lisinopril secondary to hyperkalemia.   Nicotine dependence Nicotine patch   Hepatitis C virus infection As per records from the [place], hepatitis C antibody was positive and RNA testing negative.   Hyperkalemia Resolved.     COPD (chronic obstructive pulmonary disease) with emphysema (HCC) Continue Symbicort.  DuoNebs as needed   Chronic diastolic CHF (congestive heart failure) (HCC) Euvolemic.      Discharge Diagnoses:   Principal Problem:   Critical limb ischemia of left lower extremity (HCC) Active Problems:   AKI (acute kidney injury) (HCC)   Hypertension   Nicotine dependence   Hepatitis C virus infection   Hyperkalemia   Chronic diastolic CHF (congestive heart failure) (HCC)   COPD (chronic obstructive pulmonary disease) with emphysema (HCC)   PAD (peripheral artery disease) (HCC)   Critical ischemia of foot (HCC)   30 Day Unplanned Readmission Risk Score    Flowsheet Row ED to Hosp-Admission (Current) from 09/07/2021 in [facility]  30 Day Unplanned Readmission Risk Score (%) 19.08 Filed at 09/10/2021 0801       This score is the patient's risk of an unplanned readmission within 30 days of being discharged (0 -100%). The score is based on dignosis, age, lab data, medications, orders, and past utilization.   Low:  0-14.9   Medium: 15-21.9   High: 22-29.9   Extreme: 30 and above         Discharge Instructions:  Allergies as of 09/10/2021       Reactions   Iodine Rash   Ivp Dye [iodinated Contrast Media] Hives        Medication List     STOP taking these medications    fluticasone-salmeterol 250-50 MCG/ACT Aepb Commonly known as: ADVAIR   lisinopril 10 MG tablet Commonly known as: ZESTRIL   omeprazole 20 MG capsule Commonly known as: PRILOSEC       TAKE these medications    acetaminophen 325 MG tablet Commonly known as: TYLENOL Take 650  mg by mouth 3 (three) times daily as needed for mild pain or fever.   albuterol 108 (90 Base) MCG/ACT inhaler Commonly known as: VENTOLIN HFA Inhale 2 puffs into the lungs every 6 (six) hours as needed for wheezing or shortness of breath.   amLODipine 5 MG tablet Commonly known as: NORVASC Take 1 tablet (5 mg total) by mouth daily.   apixaban 5 MG Tabs tablet Commonly known as: ELIQUIS Take 1 tablet (5 mg total) by mouth 2 (two) times daily for 7 days.   atorvastatin 80 MG  tablet Commonly known as: LIPITOR Take 80 mg by mouth at bedtime.   budesonide-formoterol 160-4.5 MCG/ACT inhaler Commonly known as: Symbicort Inhale 2 puffs into the lungs 2 (two) times daily.   carboxymethylcellulose 0.5 % Soln Commonly known as: REFRESH PLUS Place 1 drop into both eyes 4 (four) times daily as needed.   carvedilol 6.25 MG tablet Commonly known as: COREG Take 1 tablet (6.25 mg total) by mouth 2 (two) times daily with a meal. What changed:  medication strength how much to take   cholecalciferol 1000 units tablet Commonly known as: VITAMIN D Take 2,000 Units by mouth daily.   clopidogrel 75 MG tablet Commonly known as: PLAVIX Take 1 tablet (75 mg total) by mouth daily.   famotidine 20 MG tablet Commonly known as: PEPCID Take 20 mg by mouth 2 (two) times daily as needed for heartburn or indigestion.   gabapentin 400 MG capsule Commonly known as: NEURONTIN Take 1 capsule (400 mg total) by mouth 3 (three) times daily for 20 days. What changed: Another medication with the same name was removed. Continue taking this medication, and follow the directions you see here.   hydrOXYzine 25 MG tablet Commonly known as: ATARAX Take 25 mg by mouth 3 (three) times daily as needed (sleep).   ipratropium-albuterol 0.5-2.5 (3) MG/3ML Soln Commonly known as: DUONEB Take 3 mLs by nebulization every 6 (six) hours as needed.   loratadine 10 MG tablet Commonly known as: CLARITIN Take 10 mg by mouth daily as needed for allergies.   nicotine 21 mg/24hr patch Commonly known as: NICODERM CQ - dosed in mg/24 hours Place 1 patch (21 mg total) onto the skin daily.   ondansetron 4 MG disintegrating tablet Commonly known as: Zofran ODT Take 1 tablet (4 mg total) by mouth every 8 (eight) hours as needed for nausea or vomiting.   oxyCODONE 5 MG immediate release tablet Commonly known as: Oxy IR/ROXICODONE Take 1 tablet (5 mg total) by mouth every 6 (six) hours as needed for up  to 3 days for severe pain.   sennosides-docusate sodium 8.6-50 MG tablet Commonly known as: SENOKOT-S Take 1-2 tablets by mouth at bedtime as needed for constipation.   sildenafil 100 MG tablet Commonly known as: VIAGRA Take 100 mg by mouth daily as needed for erectile dysfunction.   tamsulosin 0.4 MG Caps capsule Commonly known as: FLOMAX Take 0.4 mg by mouth daily after breakfast.   thiamine 100 MG tablet Commonly known as: VITAMIN B1 Take 100 mg by mouth daily.   tiotropium 18 MCG inhalation capsule Commonly known as: SPIRIVA Place 18 mcg into inhaler and inhale daily.   traZODone 50 MG tablet Commonly known as: DESYREL Take 25 mg by mouth at bedtime.         Follow-up Information     Annice Needy, MD Follow up on 09/21/2021.   Specialties: Vascular Surgery, Radiology, Interventional Cardiology Why: See JD/ FB in 3 weeks  with ABI appointment@ 2:00 Contact information: [address] [place] [address] [phone]         [facility] Follow up in 5 day(s).   Specialty: General Practice Why: Talked to the receptionist and she said that he haven't been seen in three years so he would have to be put in as a new patient and he would have to wait to be eligible Contact information: [address] [place] [address] [phone]                 Allergies  Allergen Reactions   Iodine Rash   Ivp Dye [Iodinated Contrast Media] Hives     The results of significant diagnostics from this hospitalization (including imaging, microbiology, ancillary and laboratory) are listed below for reference.   Consultations:   Procedures/Studies: PERIPHERAL VASCULAR CATHETERIZATION  Result Date: 09/08/2021 See surgical note for result.     Labs: BNP (last 3 results) No results for input(s): "BNP" in the last 8760 hours. Basic Metabolic Panel: Recent Labs  Lab 09/06/21 2335 09/08/21 0444 09/09/21 0429  NA 141 139 139  K 4.4 5.0 3.8  CL  107 111 109  CO2 27 24 25   GLUCOSE 136* 127* 115*  BUN 16 15 20   CREATININE 1.42* 1.00 0.96  CALCIUM 9.4 9.2 8.4*   Liver Function Tests: No results for input(s): "AST", "ALT", "ALKPHOS", "BILITOT", "PROT", "ALBUMIN" in the last 168 hours. No results for input(s): "LIPASE", "AMYLASE" in the last 168 hours. No results for input(s): "AMMONIA" in the last 168 hours. CBC: Recent Labs  Lab 09/06/21 2335 09/08/21 0444 09/09/21 0429  WBC 5.1 3.9* 5.7  HGB 12.1* 11.6* 10.2*  HCT 38.1* 36.3* 31.2*  MCV 90.7 91.2 89.1  PLT 205 185 157   Cardiac Enzymes: No results for input(s): "CKTOTAL", "CKMB", "CKMBINDEX", "TROPONINI" in the last 168 hours. BNP: Invalid input(s): "POCBNP" CBG: No results for input(s): "GLUCAP" in the last 168 hours. D-Dimer No results for input(s): "DDIMER" in the last 72 hours. Hgb A1c No results for input(s): "HGBA1C" in the last 72 hours. Lipid Profile No results for input(s): "CHOL", "HDL", "LDLCALC", "TRIG", "CHOLHDL", "LDLDIRECT" in the last 72 hours. Thyroid function studies No results for input(s): "TSH", "T4TOTAL", "T3FREE", "THYROIDAB" in the last 72 hours.  Invalid input(s): "FREET3" Anemia work up No results for input(s): "VITAMINB12", "FOLATE", "FERRITIN", "TIBC", "IRON", "RETICCTPCT" in the last 72 hours. Urinalysis No results found for: "COLORURINE", "APPEARANCEUR", "LABSPEC", "PHURINE", "GLUCOSEU", "HGBUR", "BILIRUBINUR", "KETONESUR", "PROTEINUR", "UROBILINOGEN", "NITRITE", "LEUKOCYTESUR" Sepsis Labs Recent Labs  Lab 09/06/21 2335 09/08/21 0444 09/09/21 0429  WBC 5.1 3.9* 5.7   Microbiology Recent Results (from the past 240 hour(s))  SARS Coronavirus 2 by RT PCR (hospital order, performed in [facility] hospital lab) *cepheid single result test* Anterior Nasal Swab     Status: None   Collection Time: 09/07/21 12:21 AM   Specimen: Anterior Nasal Swab  Result Value Ref Range Status   SARS Coronavirus 2 by RT PCR NEGATIVE NEGATIVE Final     Comment: (NOTE) SARS-CoV-2 target nucleic acids are NOT DETECTED.  The SARS-CoV-2 RNA is generally detectable in upper and lower respiratory specimens during the acute phase of infection. The lowest concentration of SARS-CoV-2 viral copies this assay can detect is 250 copies / mL. A negative result does not preclude SARS-CoV-2 infection and should not be used as the sole basis for treatment or other patient management decisions.  A negative result may occur with improper specimen collection / handling,  submission of specimen other than nasopharyngeal swab, presence of viral mutation(s) within the areas targeted by this assay, and inadequate number of viral copies (<250 copies / mL). A negative result must be combined with clinical observations, patient history, and epidemiological information.  Fact Sheet for Patients:   RoadLapTop.co.za  Fact Sheet for Healthcare Providers: http://kim-miller.com/  This test is not yet approved or  cleared by the [place] FDA and has been authorized for detection and/or diagnosis of SARS-CoV-2 by FDA under an Emergency Use Authorization (EUA).  This EUA will remain in effect (meaning this test can be used) for the duration of the COVID-19 declaration under Section 564(b)(1) of the Act, 21 U.S.C. section 360bbb-3(b)(1), unless the authorization is terminated or revoked sooner.  Performed at [facility], [address] [place], [place] [address]      Total time spend on discharging this patient, including the last patient exam, discussing the hospital stay, instructions for ongoing care as it relates to all pertinent caregivers, as well as preparing the medical discharge records, prescriptions, and/or referrals as applicable, is 40 minutes.    Darlin Priestly, MD  [facility] 09/10/2021, 6:47 PM

## 2021-09-10 NOTE — TOC Initial Note (Signed)
Transition of Care (TOC) - Initial/Assessment Note    Patient Details  Name: Christopher Bryan. MRN: [id number] Date of Birth: 04-06-1960  Transition of Care [facility]) CM/SW Contact:    Chapman Fitch, RN Phone Number: 09/10/2021, 10:55 AM  Clinical Narrative:                       Admitted for: limb ischemia.  S/p angiogram Admitted [facility]: [facility]truck [facility] Current home health/prior home health/DME: [facility]  Patient states that he owns his mobile home, but has fallen behind on his lot rent and has been staying in his truck.  Patient states that his truck is in the parking lot and at discharge he will drive himself to his sisters home in [place] where he will be staying. Patient states at the end of August he is schedule to check in to [facility] in [place] which is a substance abuse program.  Patient states he will be there for 2 months.  Patient has questions regarding his follow up appointments. Message sent to MDs and I have asked the bedside RN to relay their response to him at discharge.    Patient Goals and CMS Choice        Expected Discharge Plan and Services           Expected Discharge Date: 09/10/21                                    Prior Living Arrangements/Services                       Activities of Daily Living Home Assistive Devices/Equipment: Gilmer Mor (specify quad or straight), Eyeglasses ADL Screening (condition at time of admission) Patient's cognitive ability adequate to safely complete daily activities?: Yes Is the patient deaf or have difficulty hearing?: No Does the patient have difficulty seeing, even when wearing glasses/contacts?: No Does the patient have difficulty concentrating, remembering, or making decisions?: No Patient able to express need for assistance with ADLs?: Yes Does the patient have difficulty dressing or bathing?: No Independently performs ADLs?: Yes (appropriate for developmental  age) Does the patient have difficulty walking or climbing stairs?: No Weakness of Legs: None Weakness of Arms/Hands: None  Permission Sought/Granted                  Emotional Assessment              Admission diagnosis:  Ischemic leg [I99.8] AKI (acute kidney injury) (HCC) [N17.9] Critical ischemia of foot (HCC) [I70.229] Critical limb ischemia of left lower extremity (HCC) [I70.222] Patient Active Problem List   Diagnosis Date Noted   Hepatitis C virus infection 09/08/2021   Hyperkalemia 09/08/2021   Critical limb ischemia of left lower extremity (HCC) 09/07/2021   Critical ischemia of foot (HCC) 09/07/2021   AKI (acute kidney injury) (HCC) 09/07/2021   Protein-calorie malnutrition, severe 01/22/2020   Nontraumatic ischemic infarction of muscle, left lower leg 01/20/2020   Dehydration 01/20/2020   Arterial occlusion, lower extremity (HCC) 01/20/2020   Hypertension    Nicotine dependence    COPD (chronic obstructive pulmonary disease) with emphysema (HCC)    PAD (peripheral artery disease) (HCC)    Acute hypoxemic respiratory failure (HCC) 01/08/2018   Chronic diastolic CHF (congestive heart failure) (HCC) 06/21/2016   Acute right-sided heart failure (HCC) 06/21/2016   PCP:  [facility]:    [facility] [address] [place] - [address] [place] [address] Phone: [phone] Fax: [phone]     Social Determinants of Health (SDOH) Interventions    Readmission Risk Interventions     No data to display

## 2021-09-10 NOTE — Plan of Care (Signed)

## 2021-09-21 ENCOUNTER — Ambulatory Visit (INDEPENDENT_AMBULATORY_CARE_PROVIDER_SITE_OTHER): Payer: Medicare Other | Admitting: Vascular Surgery

## 2021-09-21 ENCOUNTER — Encounter (INDEPENDENT_AMBULATORY_CARE_PROVIDER_SITE_OTHER): Payer: Medicare Other

## 2021-09-21 ENCOUNTER — Other Ambulatory Visit (INDEPENDENT_AMBULATORY_CARE_PROVIDER_SITE_OTHER): Payer: Self-pay | Admitting: Vascular Surgery

## 2021-09-21 DIAGNOSIS — I70222 Atherosclerosis of native arteries of extremities with rest pain, left leg: Secondary | ICD-10-CM

## 2021-09-21 DIAGNOSIS — Z95820 Peripheral vascular angioplasty status with implants and grafts: Secondary | ICD-10-CM

## 2021-10-15 ENCOUNTER — Ambulatory Visit (INDEPENDENT_AMBULATORY_CARE_PROVIDER_SITE_OTHER): Payer: Medicare Other | Admitting: Nurse Practitioner

## 2021-10-15 ENCOUNTER — Encounter (INDEPENDENT_AMBULATORY_CARE_PROVIDER_SITE_OTHER): Payer: Medicare Other

## 2021-12-18 IMAGING — CR DG CHEST 2V
1 series · 2 of 2 positions shown · non-contrast
Comparison: 01/08/2018

CLINICAL DATA: Chest pain and shortness of breath.

EXAM:
CHEST - 2 VIEW

[Series 1: dg chest 2 view · 0.14mm/px · 2 of 2 slices shown]
[im 1/2]
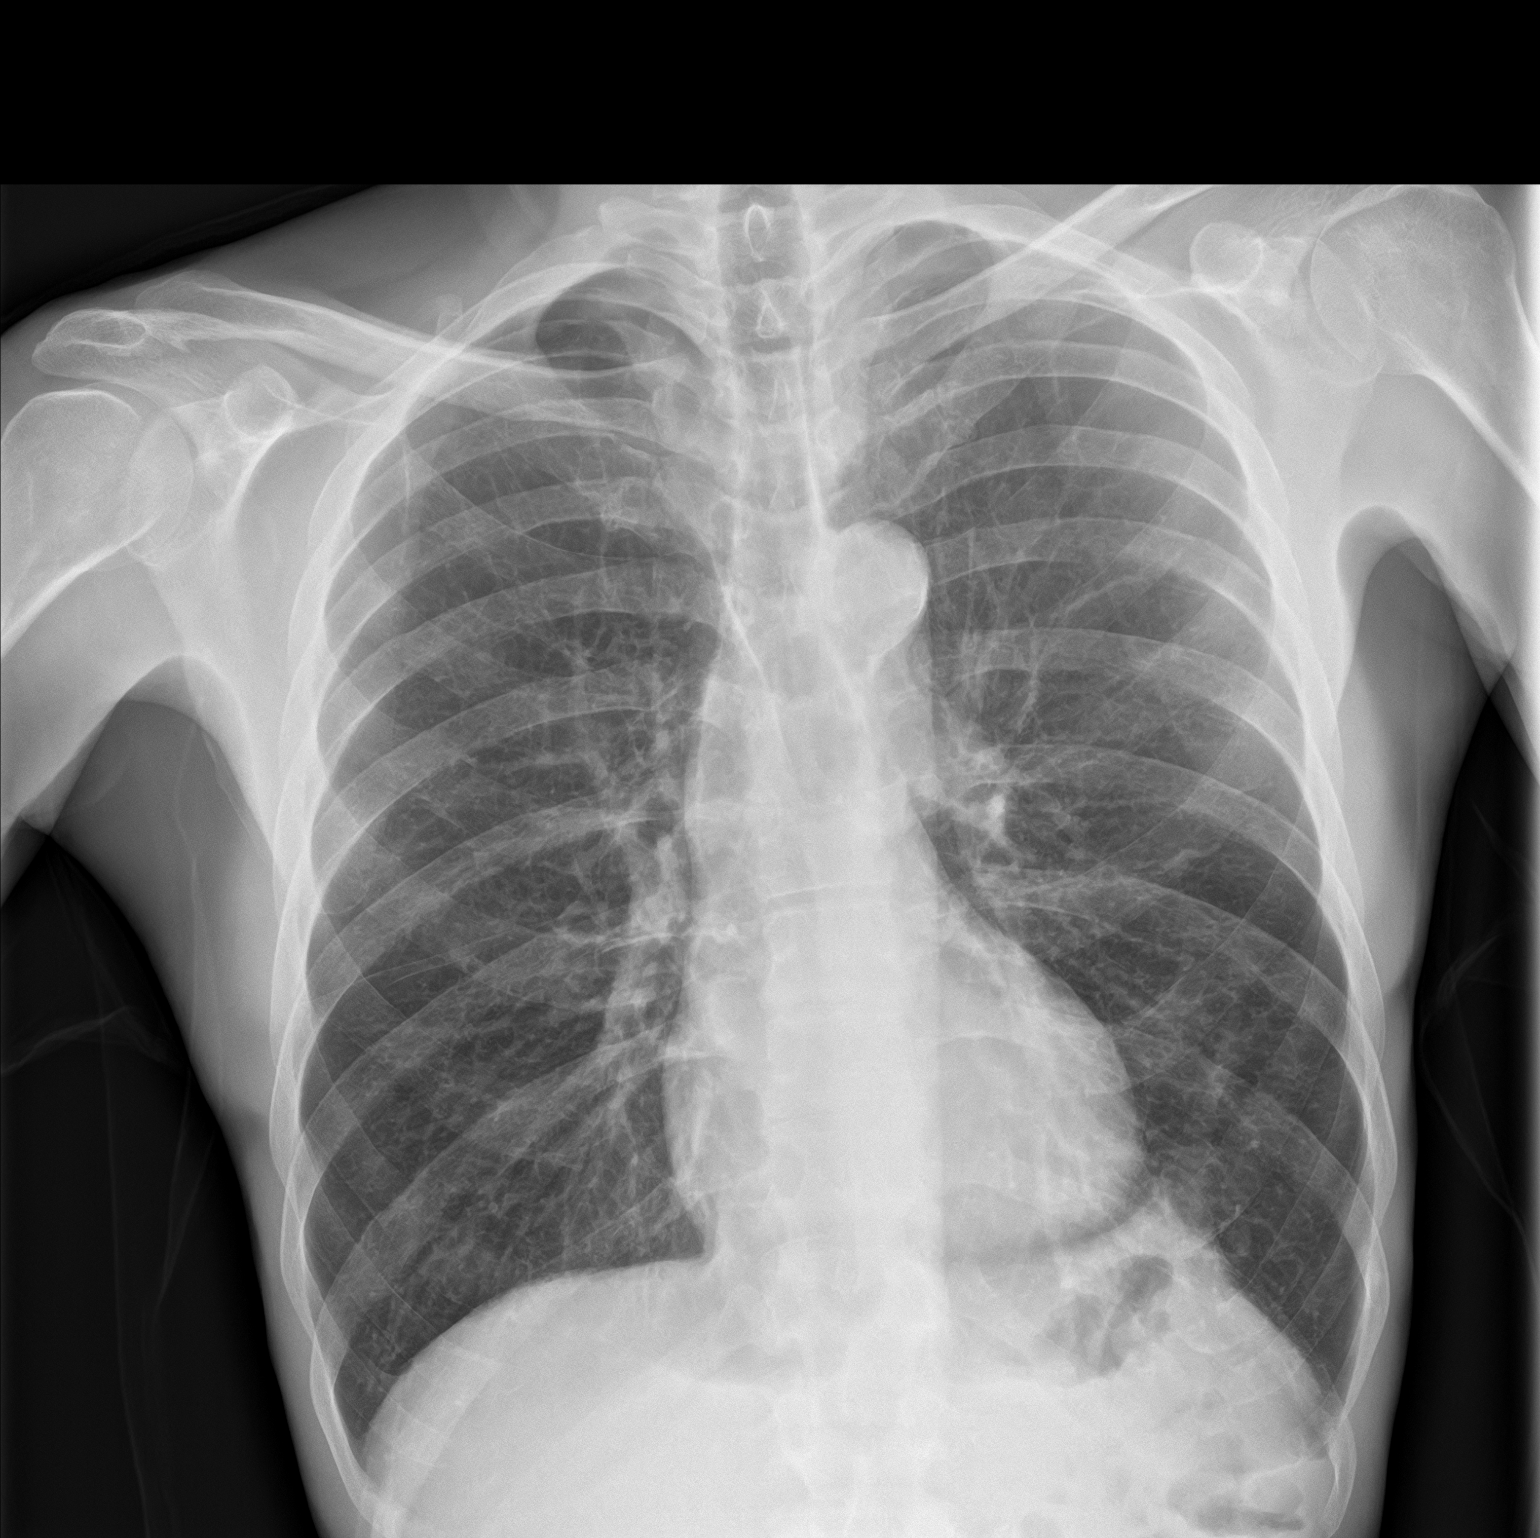
[im 2/2]
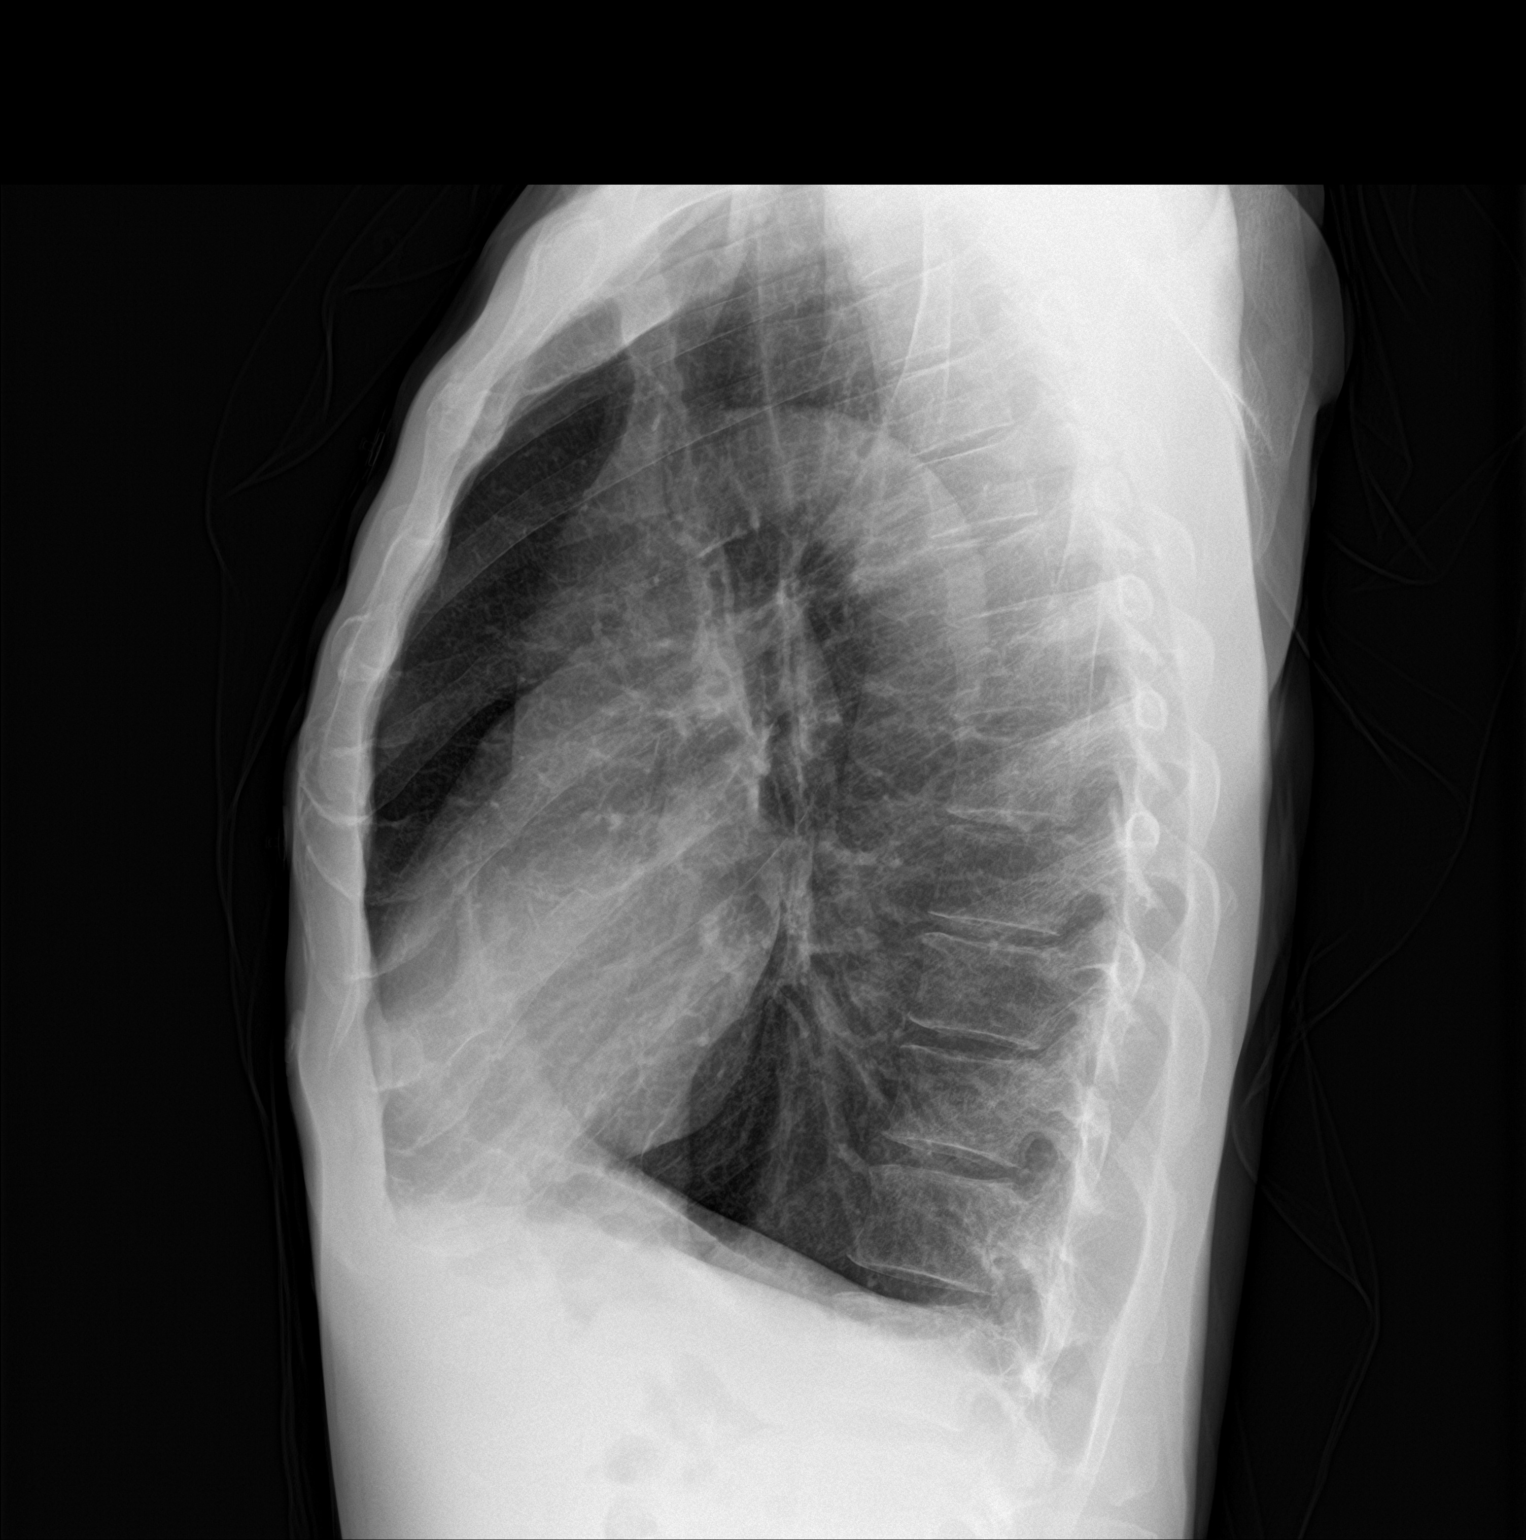

[2 of 2 positions shown; findings below may reference images not displayed]

FINDINGS: The heart size and mediastinal contours are within normal limits.
Aortic atherosclerosis. Both lungs are clear. The visualized
skeletal structures are unremarkable.
IMPRESSION: No active cardiopulmonary disease.

## 2021-12-18 IMAGING — CT CT ANGIO CHEST
2 of 6 series · 18 of 46 positions shown · IV contrast (APPLIED)
Comparison: 06/04/2005

CLINICAL DATA: PE suspected, high prob. Dyspnea. History of DVT on
chronic anticoagulation.

EXAM:
CT ANGIOGRAPHY CHEST WITH CONTRAST
TECHNIQUE: Multidetector CT imaging of the chest was performed using the
standard protocol during bolus administration of intravenous
contrast. Multiplanar CT image reconstructions and MIPs were
obtained to evaluate the vascular anatomy.
CONTRAST:  75mL OMNIPAQUE IOHEXOL 350 MG/ML SOLN

[Series 6: thins · axial · 0.75mm/px · z∈[+152,+466]mm · 15 of 428 slices shown]
[im 18/428  lung]
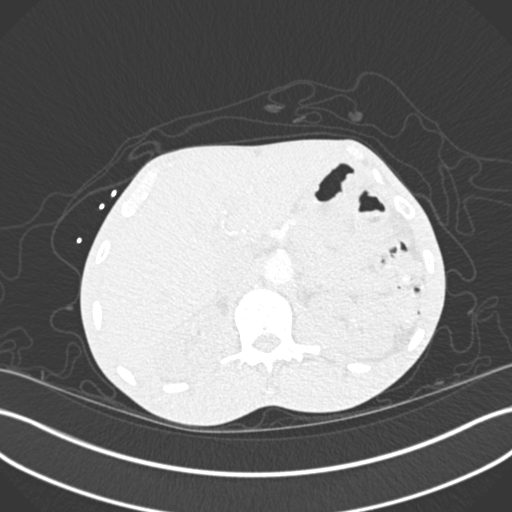
[im 54/428  soft-tissue]
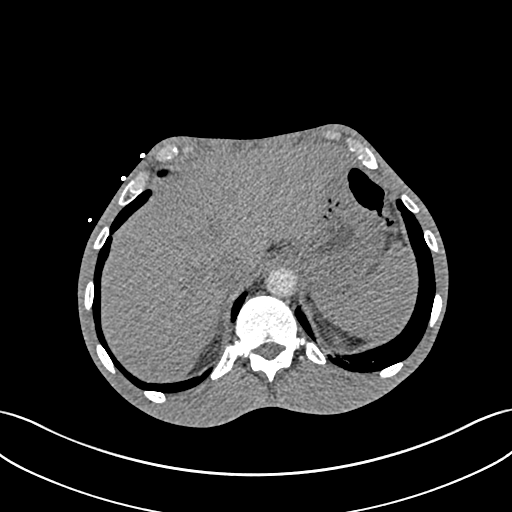
[im 72/428  lung]
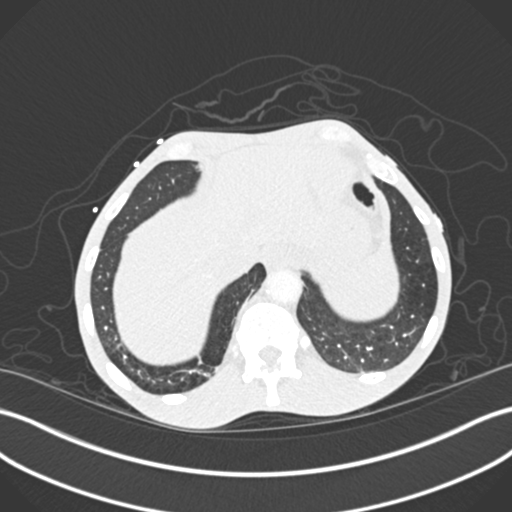
[im 107/428  soft-tissue]
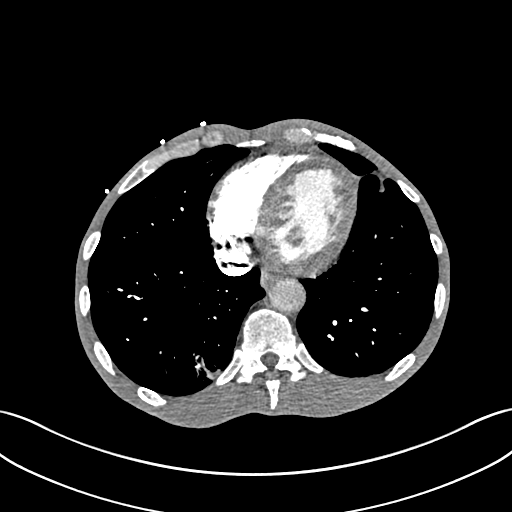
[im 125/428  lung]
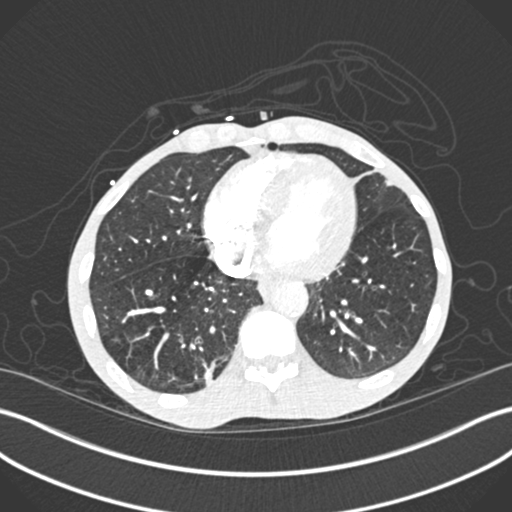
[im 161/428  soft-tissue]
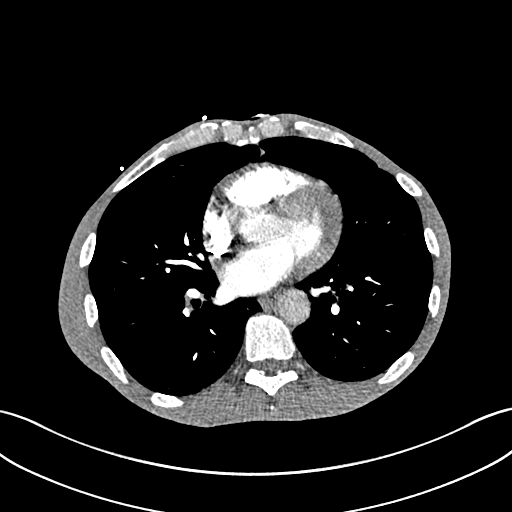
[im 178/428  lung]
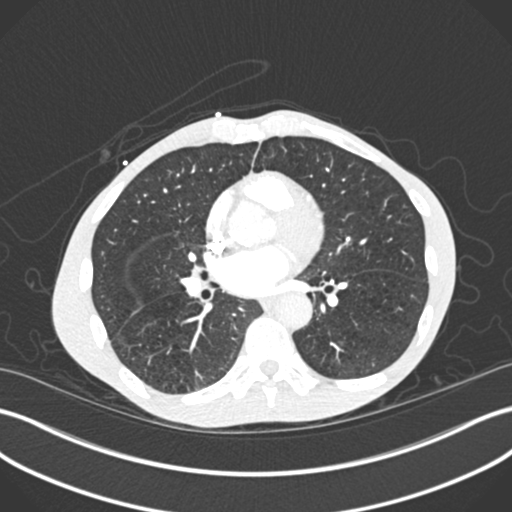
[im 214/428  soft-tissue]
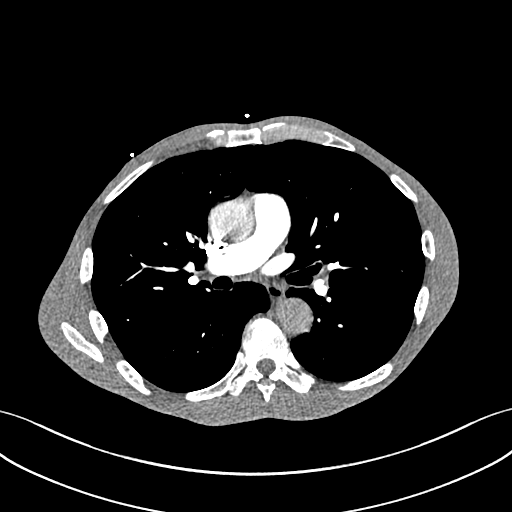
[im 250/428  lung]
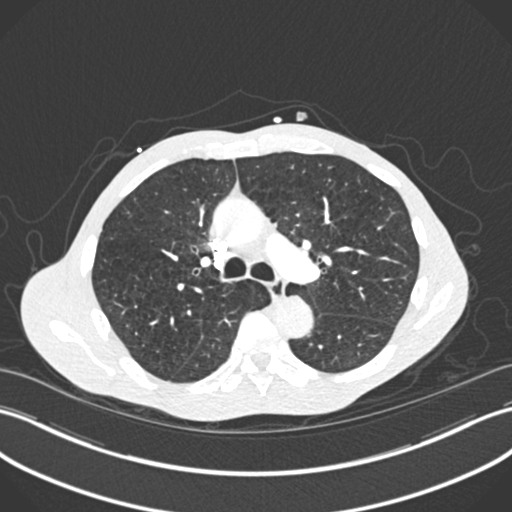
[im 267/428  soft-tissue]
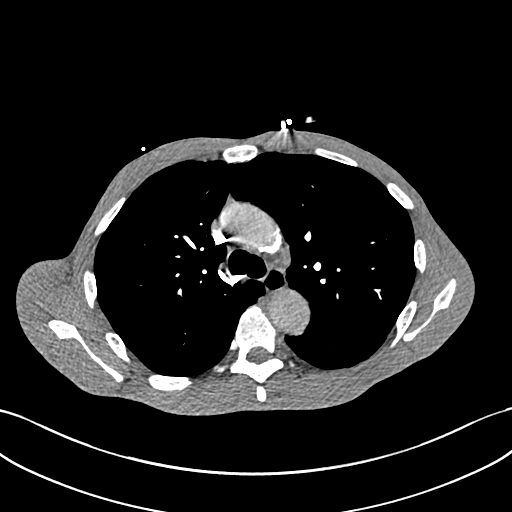
[im 303/428  lung]
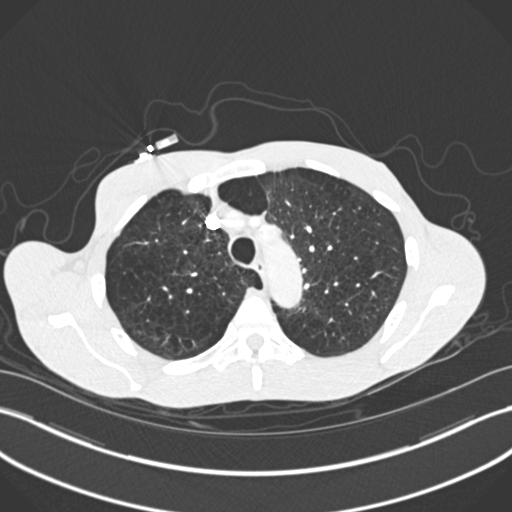
[im 321/428  soft-tissue]
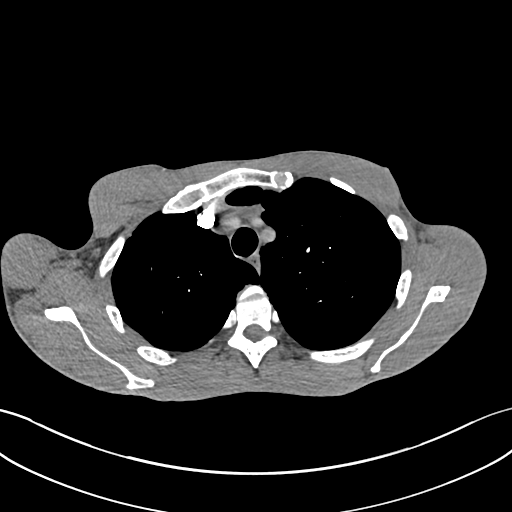
[im 356/428  lung]
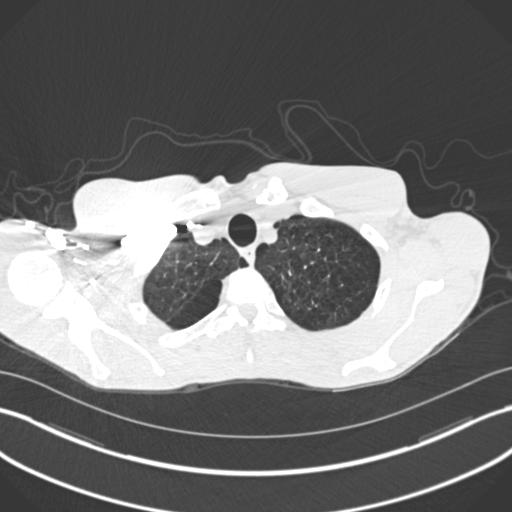
[im 374/428  soft-tissue]
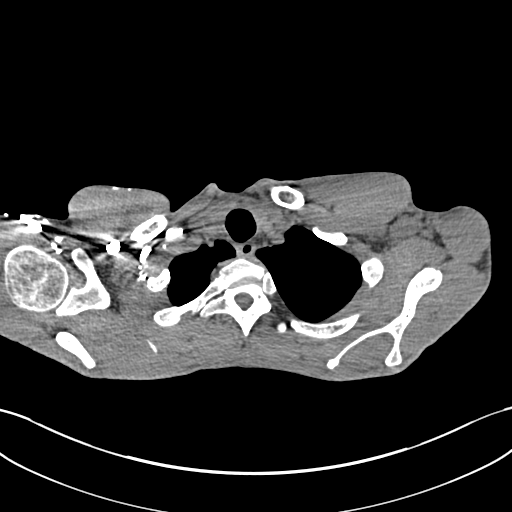
[im 410/428  lung]
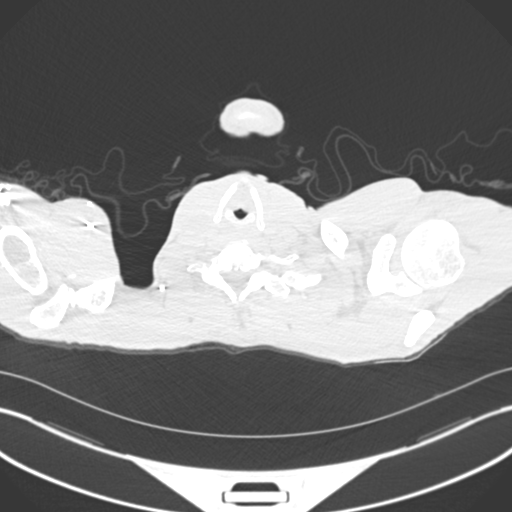

[Series 8: coronal mpr · coronal · 0.69mm/px · 3 of 105 slices shown]
[im 27/105  soft-tissue]
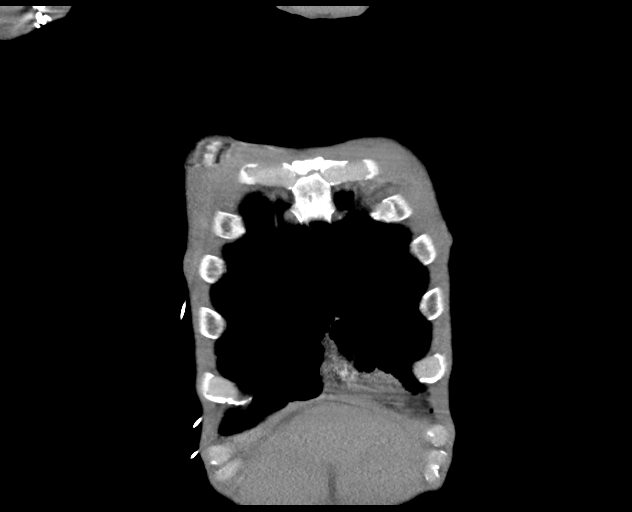
[im 53/105  soft-tissue]
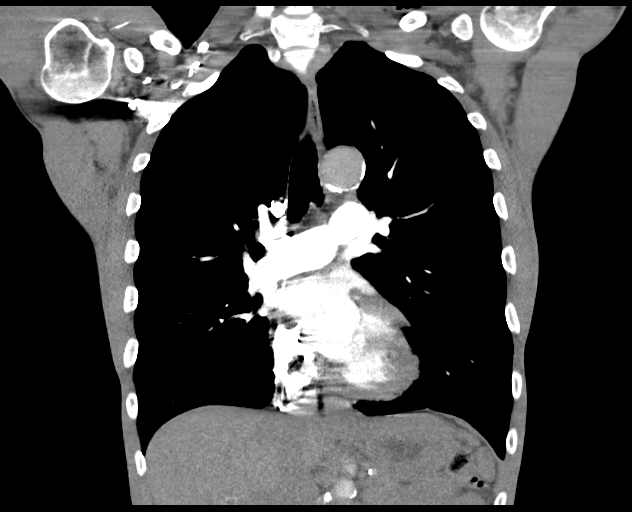
[im 79/105  soft-tissue]
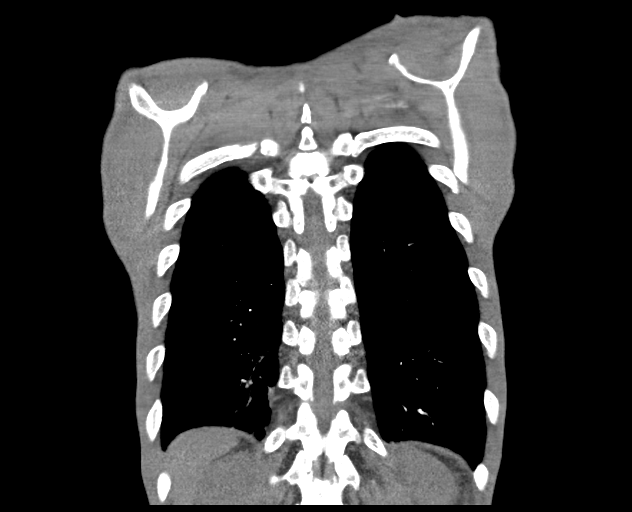

[18 of 46 positions shown; findings below may reference images not displayed]

FINDINGS: Cardiovascular: There is adequate opacification of the pulmonary
arterial tree. No intraluminal filling defects identified to suggest
acute pulmonary embolism. There are synechia identified within the
segmental pulmonary arteries of the lower lobes bilaterally in
keeping with the sequela of remote pulmonary embolism. The central
pulmonary arteries are of normal caliber.

Cardiac size is within normal limits though left ventricular
hypertrophy is noted. No pericardial effusion. Mild atherosclerotic
calcification within the thoracic aorta. No aortic aneurysm.

Mediastinum/Nodes: No enlarged mediastinal, hilar, or axillary lymph
nodes. Thyroid gland, trachea, and esophagus demonstrate no
significant findings.

Lungs/Pleura: Moderate centrilobular emphysema. Bibasilar mild
parenchymal scarring noted. No focal pulmonary nodules or
infiltrates. No pneumothorax or pleural effusion. Central airways
are widely patent.

Upper Abdomen: No acute abnormality.

Musculoskeletal: No acute bone abnormality.

Review of the MIP images confirms the above findings.
IMPRESSION: No acute pulmonary embolism. Mild sequela of remote pulmonary
embolism noted.

Left ventricular hypertrophy.

Moderate centrilobular emphysema.

Aortic Atherosclerosis (5IXYW-JU3.3) and Emphysema ([id number]-G7K.6).

## 2022-06-20 ENCOUNTER — Inpatient Hospital Stay
Admission: EM | Admit: 2022-06-20 | Discharge: 2022-06-24 | DRG: 271 | Disposition: A | Discharge status: Home / Self Care | Payer: No Typology Code available for payment source | Attending: Vascular Surgery | Admitting: Vascular Surgery

## 2022-06-20 ENCOUNTER — Emergency Department: Payer: No Typology Code available for payment source

## 2022-06-20 ENCOUNTER — Other Ambulatory Visit: Payer: Self-pay

## 2022-06-20 ENCOUNTER — Inpatient Hospital Stay: Payer: No Typology Code available for payment source

## 2022-06-20 ENCOUNTER — Encounter
Admission: EM | Disposition: A | Discharge status: Home / Self Care | Payer: Self-pay | Source: Home / Self Care | Attending: Vascular Surgery

## 2022-06-20 DIAGNOSIS — I743 Embolism and thrombosis of arteries of the lower extremities: Secondary | ICD-10-CM | POA: Diagnosis present

## 2022-06-20 DIAGNOSIS — Z91148 Patient's other noncompliance with medication regimen for other reason: Secondary | ICD-10-CM | POA: Diagnosis not present

## 2022-06-20 DIAGNOSIS — Z95828 Presence of other vascular implants and grafts: Secondary | ICD-10-CM | POA: Diagnosis not present

## 2022-06-20 DIAGNOSIS — T82868A Thrombosis of vascular prosthetic devices, implants and grafts, initial encounter: Secondary | ICD-10-CM | POA: Diagnosis present

## 2022-06-20 DIAGNOSIS — Z8249 Family history of ischemic heart disease and other diseases of the circulatory system: Secondary | ICD-10-CM

## 2022-06-20 DIAGNOSIS — T85858A Stenosis due to other internal prosthetic devices, implants and grafts, initial encounter: Secondary | ICD-10-CM | POA: Diagnosis not present

## 2022-06-20 DIAGNOSIS — Z825 Family history of asthma and other chronic lower respiratory diseases: Secondary | ICD-10-CM | POA: Diagnosis not present

## 2022-06-20 DIAGNOSIS — Z7901 Long term (current) use of anticoagulants: Secondary | ICD-10-CM

## 2022-06-20 DIAGNOSIS — Z79899 Other long term (current) drug therapy: Secondary | ICD-10-CM

## 2022-06-20 DIAGNOSIS — T82858A Stenosis of vascular prosthetic devices, implants and grafts, initial encounter: Secondary | ICD-10-CM | POA: Diagnosis present

## 2022-06-20 DIAGNOSIS — J449 Chronic obstructive pulmonary disease, unspecified: Secondary | ICD-10-CM | POA: Diagnosis present

## 2022-06-20 DIAGNOSIS — I10 Essential (primary) hypertension: Secondary | ICD-10-CM | POA: Diagnosis present

## 2022-06-20 DIAGNOSIS — Z95820 Peripheral vascular angioplasty status with implants and grafts: Secondary | ICD-10-CM

## 2022-06-20 DIAGNOSIS — Z7951 Long term (current) use of inhaled steroids: Secondary | ICD-10-CM | POA: Diagnosis not present

## 2022-06-20 DIAGNOSIS — R0989 Other specified symptoms and signs involving the circulatory and respiratory systems: Secondary | ICD-10-CM | POA: Diagnosis not present

## 2022-06-20 DIAGNOSIS — Y832 Surgical operation with anastomosis, bypass or graft as the cause of abnormal reaction of the patient, or of later complication, without mention of misadventure at the time of the procedure: Secondary | ICD-10-CM | POA: Diagnosis present

## 2022-06-20 DIAGNOSIS — M19012 Primary osteoarthritis, left shoulder: Secondary | ICD-10-CM | POA: Diagnosis present

## 2022-06-20 DIAGNOSIS — I82402 Acute embolism and thrombosis of unspecified deep veins of left lower extremity: Secondary | ICD-10-CM

## 2022-06-20 DIAGNOSIS — I82413 Acute embolism and thrombosis of femoral vein, bilateral: Secondary | ICD-10-CM | POA: Diagnosis present

## 2022-06-20 DIAGNOSIS — Z91041 Radiographic dye allergy status: Secondary | ICD-10-CM | POA: Diagnosis not present

## 2022-06-20 DIAGNOSIS — I4891 Unspecified atrial fibrillation: Secondary | ICD-10-CM | POA: Diagnosis present

## 2022-06-20 DIAGNOSIS — I70422 Atherosclerosis of autologous vein bypass graft(s) of the extremities with rest pain, left leg: Secondary | ICD-10-CM | POA: Diagnosis present

## 2022-06-20 DIAGNOSIS — I70222 Atherosclerosis of native arteries of extremities with rest pain, left leg: Secondary | ICD-10-CM | POA: Diagnosis not present

## 2022-06-20 DIAGNOSIS — Z9889 Other specified postprocedural states: Secondary | ICD-10-CM | POA: Diagnosis not present

## 2022-06-20 DIAGNOSIS — I998 Other disorder of circulatory system: Secondary | ICD-10-CM | POA: Diagnosis present

## 2022-06-20 DIAGNOSIS — Z7902 Long term (current) use of antithrombotics/antiplatelets: Secondary | ICD-10-CM | POA: Diagnosis not present

## 2022-06-20 HISTORY — PX: LOWER EXTREMITY ANGIOGRAPHY: CATH118251

## 2022-06-20 LAB — BASIC METABOLIC PANEL
Anion gap: 11 (ref 5–15)
BUN: 21 mg/dL (ref 8–23)
CO2: 24 mmol/L (ref 22–32)
Calcium: 8.8 mg/dL — ABNORMAL LOW (ref 8.9–10.3)
Chloride: 96 mmol/L — ABNORMAL LOW (ref 98–111)
Creatinine, Ser: 1.27 mg/dL — ABNORMAL HIGH (ref 0.61–1.24)
GFR, Estimated: >60 mL/min (ref >60)
Glucose, Bld: 96 mg/dL (ref 70–99)
Potassium: 4.5 mmol/L (ref 3.5–5.1)
Sodium: 131 mmol/L — ABNORMAL LOW (ref 135–145)

## 2022-06-20 LAB — CBC
HCT: 37.2 % — ABNORMAL LOW (ref 39.0–52.0)
Hemoglobin: 12 g/dL — ABNORMAL LOW (ref 13.0–17.0)
MCH: 28.7 pg (ref 26.0–34.0)
MCHC: 32.3 g/dL (ref 30.0–36.0)
MCV: 89 fL (ref 80.0–100.0)
Platelets: 329 10*3/uL (ref 150–400)
RBC: 4.18 MIL/uL — ABNORMAL LOW (ref 4.22–5.81)
RDW: 13 % (ref 11.5–15.5)
WBC: 5.1 10*3/uL (ref 4.0–10.5)
nRBC: 0 % (ref 0.0–0.2)

## 2022-06-20 LAB — APTT: aPTT: 29 seconds (ref 24–36)

## 2022-06-20 LAB — GLUCOSE, CAPILLARY: Glucose-Capillary: 88 mg/dL (ref 70–99)

## 2022-06-20 LAB — HEPARIN LEVEL (UNFRACTIONATED): Heparin Unfractionated: >1.1 IU/mL — ABNORMAL HIGH (ref 0.30–0.70)

## 2022-06-20 LAB — PROTIME-INR
INR: 1.1 (ref 0.8–1.2)
Prothrombin Time: 14.5 seconds (ref 11.4–15.2)

## 2022-06-20 LAB — D-DIMER, QUANTITATIVE: D-Dimer, Quant: 1.26 ug/mL-FEU — ABNORMAL HIGH (ref 0.00–0.50)

## 2022-06-20 LAB — MRSA NEXT GEN BY PCR, NASAL: MRSA by PCR Next Gen: NOT DETECTED

## 2022-06-20 SURGERY — LOWER EXTREMITY ANGIOGRAPHY
Anesthesia: Moderate Sedation | Laterality: Left

## 2022-06-20 MED ORDER — CEFAZOLIN SODIUM-DEXTROSE 2-4 GM/100ML-% IV SOLN
INTRAVENOUS | Status: AC
  Filled 2022-06-20: qty 100

## 2022-06-20 MED ORDER — SILDENAFIL CITRATE 100 MG PO TABS: 100.0000 mg | ORAL_TABLET | Freq: Every day | ORAL | Status: DC | PRN

## 2022-06-20 MED ORDER — VITAMIN D 25 MCG (1000 UNIT) PO TABS
2000.0000 [IU] | ORAL_TABLET | Freq: Every day | ORAL | Status: DC
  Administered 2022-06-20 – 2022-06-24 (×5): 2000 [IU] via ORAL
  Filled 2022-06-20 (×5): qty 2

## 2022-06-20 MED ORDER — LABETALOL HCL 5 MG/ML IV SOLN
INTRAVENOUS | Status: AC
  Filled 2022-06-20: qty 4

## 2022-06-20 MED ORDER — ALTEPLASE 1 MG/ML SYRINGE FOR VASCULAR PROCEDURE
INTRAMUSCULAR | Status: DC | PRN
  Administered 2022-06-20: 4 mg via INTRA_ARTERIAL

## 2022-06-20 MED ORDER — TRAZODONE HCL 50 MG PO TABS: 25.0000 mg | ORAL_TABLET | Freq: Every day | ORAL | Status: DC

## 2022-06-20 MED ORDER — TIROFIBAN HCL IN NACL 5-0.9 MG/100ML-% IV SOLN
0.0750 ug/kg/min | INTRAVENOUS | Status: AC
  Administered 2022-06-20: 0.075 ug/kg/min via INTRAVENOUS
  Filled 2022-06-20 (×2): qty 100

## 2022-06-20 MED ORDER — TIROFIBAN HCL IV 12.5 MG/250 ML
INTRAVENOUS | Status: AC
  Filled 2022-06-20: qty 250

## 2022-06-20 MED ORDER — HYDRALAZINE HCL 20 MG/ML IJ SOLN
10.0000 mg | Freq: Once | INTRAMUSCULAR | Status: AC
  Administered 2022-06-20: 10 mg via INTRAVENOUS
  Filled 2022-06-20: qty 1

## 2022-06-20 MED ORDER — ATORVASTATIN CALCIUM 20 MG PO TABS
80.0000 mg | ORAL_TABLET | Freq: Every day | ORAL | Status: DC
  Administered 2022-06-20 – 2022-06-23 (×4): 80 mg via ORAL
  Filled 2022-06-20 (×2): qty 4
  Filled 2022-06-20: qty 1
  Filled 2022-06-20 (×2): qty 4

## 2022-06-20 MED ORDER — HEPARIN (PORCINE) 25000 UT/250ML-% IV SOLN
900.0000 [IU]/h | INTRAVENOUS | Status: DC
  Administered 2022-06-20: 900 [IU]/h via INTRAVENOUS
  Filled 2022-06-20: qty 250

## 2022-06-20 MED ORDER — CLOPIDOGREL BISULFATE 75 MG PO TABS
75.0000 mg | ORAL_TABLET | Freq: Every day | ORAL | Status: DC
  Administered 2022-06-21 – 2022-06-24 (×4): 75 mg via ORAL
  Filled 2022-06-20 (×4): qty 1

## 2022-06-20 MED ORDER — AMLODIPINE BESYLATE 5 MG PO TABS: 5.0000 mg | ORAL_TABLET | Freq: Every day | ORAL | Status: DC

## 2022-06-20 MED ORDER — FAMOTIDINE 20 MG PO TABS
ORAL_TABLET | ORAL | Status: AC
  Filled 2022-06-20: qty 2

## 2022-06-20 MED ORDER — ACETAMINOPHEN 325 MG PO TABS: 650.0000 mg | ORAL_TABLET | Freq: Three times a day (TID) | ORAL | Status: DC | PRN

## 2022-06-20 MED ORDER — POLYVINYL ALCOHOL 1.4 % OP SOLN: 1.0000 [drp] | Freq: Four times a day (QID) | OPHTHALMIC | Status: DC | PRN

## 2022-06-20 MED ORDER — GABAPENTIN 400 MG PO CAPS
400.0000 mg | ORAL_CAPSULE | Freq: Three times a day (TID) | ORAL | Status: DC
  Administered 2022-06-20 – 2022-06-24 (×12): 400 mg via ORAL
  Filled 2022-06-20 (×12): qty 1

## 2022-06-20 MED ORDER — METOPROLOL TARTRATE 5 MG/5ML IV SOLN
5.0000 mg | Freq: Once | INTRAVENOUS | Status: AC
  Administered 2022-06-20: 5 mg via INTRAVENOUS
  Filled 2022-06-20: qty 5

## 2022-06-20 MED ORDER — TIOTROPIUM BROMIDE MONOHYDRATE 18 MCG IN CAPS
18.0000 ug | ORAL_CAPSULE | Freq: Every day | RESPIRATORY_TRACT | Status: DC
  Administered 2022-06-20 – 2022-06-24 (×4): 18 ug via RESPIRATORY_TRACT
  Filled 2022-06-20: qty 5

## 2022-06-20 MED ORDER — LABETALOL HCL 5 MG/ML IV SOLN
INTRAVENOUS | Status: DC | PRN
  Administered 2022-06-20: 10 mg via INTRAVENOUS

## 2022-06-20 MED ORDER — HYDROMORPHONE HCL 1 MG/ML IJ SOLN
1.0000 mg | Freq: Once | INTRAMUSCULAR | Status: AC
  Administered 2022-06-20: 1 mg via INTRAVENOUS
  Filled 2022-06-20: qty 1

## 2022-06-20 MED ORDER — CHLORHEXIDINE GLUCONATE CLOTH 2 % EX PADS
6.0000 | MEDICATED_PAD | Freq: Every day | CUTANEOUS | Status: DC
  Administered 2022-06-20 – 2022-06-23 (×3): 6 via TOPICAL

## 2022-06-20 MED ORDER — DIPHENHYDRAMINE HCL 50 MG/ML IJ SOLN
INTRAMUSCULAR | Status: AC
  Filled 2022-06-20: qty 1

## 2022-06-20 MED ORDER — LORATADINE 10 MG PO TABS: 10.0000 mg | ORAL_TABLET | Freq: Every day | ORAL | Status: DC | PRN

## 2022-06-20 MED ORDER — DIPHENHYDRAMINE HCL 50 MG/ML IJ SOLN
50.0000 mg | Freq: Once | INTRAMUSCULAR | Status: AC
  Administered 2022-06-20: 50 mg via INTRAVENOUS

## 2022-06-20 MED ORDER — ONDANSETRON 4 MG PO TBDP: 4.0000 mg | ORAL_TABLET | Freq: Three times a day (TID) | ORAL | Status: DC | PRN

## 2022-06-20 MED ORDER — FENTANYL CITRATE (PF) 100 MCG/2ML IJ SOLN
INTRAMUSCULAR | Status: DC | PRN
  Administered 2022-06-20: 50 ug via INTRAVENOUS

## 2022-06-20 MED ORDER — HYDROMORPHONE HCL 1 MG/ML IJ SOLN
1.0000 mg | INTRAMUSCULAR | Status: DC | PRN
  Administered 2022-06-20 – 2022-06-22 (×7): 1 mg via INTRAVENOUS
  Filled 2022-06-20 (×8): qty 1

## 2022-06-20 MED ORDER — METHYLPREDNISOLONE SODIUM SUCC 125 MG IJ SOLR
INTRAMUSCULAR | Status: AC
  Filled 2022-06-20: qty 2

## 2022-06-20 MED ORDER — FAMOTIDINE 20 MG PO TABS
40.0000 mg | ORAL_TABLET | Freq: Once | ORAL | Status: AC
  Administered 2022-06-20: 40 mg via ORAL

## 2022-06-20 MED ORDER — MOMETASONE FURO-FORMOTEROL FUM 200-5 MCG/ACT IN AERO
2.0000 | INHALATION_SPRAY | Freq: Two times a day (BID) | RESPIRATORY_TRACT | Status: DC
  Administered 2022-06-20 – 2022-06-24 (×6): 2 via RESPIRATORY_TRACT
  Filled 2022-06-20: qty 8.8

## 2022-06-20 MED ORDER — SODIUM CHLORIDE 0.9 % IV SOLN: INTRAVENOUS | Status: DC

## 2022-06-20 MED ORDER — APIXABAN 5 MG PO TABS
5.0000 mg | ORAL_TABLET | Freq: Two times a day (BID) | ORAL | Status: DC
  Administered 2022-06-21 – 2022-06-24 (×7): 5 mg via ORAL
  Filled 2022-06-20 (×7): qty 1

## 2022-06-20 MED ORDER — TIROFIBAN (AGGRASTAT) BOLUS VIA INFUSION
25.0000 ug/kg | Freq: Once | INTRAVENOUS | Status: AC
  Administered 2022-06-20: 1320 ug via INTRAVENOUS
  Filled 2022-06-20: qty 27

## 2022-06-20 MED ORDER — MIDAZOLAM HCL 5 MG/5ML IJ SOLN
INTRAMUSCULAR | Status: AC
  Filled 2022-06-20: qty 5

## 2022-06-20 MED ORDER — HEPARIN BOLUS VIA INFUSION
3600.0000 [IU] | Freq: Once | INTRAVENOUS | Status: AC
  Administered 2022-06-20: 3600 [IU] via INTRAVENOUS
  Filled 2022-06-20: qty 3600

## 2022-06-20 MED ORDER — APIXABAN 5 MG PO TABS: 5.0000 mg | ORAL_TABLET | Freq: Two times a day (BID) | ORAL | Status: DC

## 2022-06-20 MED ORDER — HEPARIN SODIUM (PORCINE) 1000 UNIT/ML IJ SOLN
INTRAMUSCULAR | Status: AC
  Filled 2022-06-20: qty 10

## 2022-06-20 MED ORDER — CARVEDILOL 6.25 MG PO TABS
6.2500 mg | ORAL_TABLET | Freq: Two times a day (BID) | ORAL | Status: DC
  Administered 2022-06-20 – 2022-06-24 (×8): 6.25 mg via ORAL
  Filled 2022-06-20 (×8): qty 1

## 2022-06-20 MED ORDER — MIDAZOLAM HCL 2 MG/2ML IJ SOLN
INTRAMUSCULAR | Status: DC | PRN
  Administered 2022-06-20: 1 mg via INTRAVENOUS
  Administered 2022-06-20: 2 mg via INTRAVENOUS

## 2022-06-20 MED ORDER — FAMOTIDINE 20 MG PO TABS
20.0000 mg | ORAL_TABLET | Freq: Two times a day (BID) | ORAL | Status: DC | PRN
  Administered 2022-06-24: 20 mg via ORAL
  Filled 2022-06-20: qty 1

## 2022-06-20 MED ORDER — FENTANYL CITRATE (PF) 100 MCG/2ML IJ SOLN
INTRAMUSCULAR | Status: AC
  Filled 2022-06-20: qty 2

## 2022-06-20 MED ORDER — SENNOSIDES-DOCUSATE SODIUM 8.6-50 MG PO TABS: 1.0000 | ORAL_TABLET | Freq: Every evening | ORAL | Status: DC | PRN

## 2022-06-20 MED ORDER — TAMSULOSIN HCL 0.4 MG PO CAPS
0.4000 mg | ORAL_CAPSULE | Freq: Every day | ORAL | Status: DC
  Administered 2022-06-20 – 2022-06-24 (×5): 0.4 mg via ORAL
  Filled 2022-06-20 (×5): qty 1

## 2022-06-20 MED ORDER — METHYLPREDNISOLONE SODIUM SUCC 125 MG IJ SOLR
125.0000 mg | Freq: Once | INTRAMUSCULAR | Status: AC
  Administered 2022-06-20: 125 mg via INTRAVENOUS

## 2022-06-20 MED ORDER — HYDROXYZINE HCL 25 MG PO TABS
25.0000 mg | ORAL_TABLET | Freq: Three times a day (TID) | ORAL | Status: DC | PRN
  Administered 2022-06-20: 25 mg via ORAL
  Filled 2022-06-20: qty 1

## 2022-06-20 MED ORDER — CEFAZOLIN SODIUM-DEXTROSE 2-4 GM/100ML-% IV SOLN
2.0000 g | INTRAVENOUS | Status: AC
  Administered 2022-06-20: 2 g via INTRAVENOUS

## 2022-06-20 MED ORDER — ALBUTEROL SULFATE (2.5 MG/3ML) 0.083% IN NEBU: 3.0000 mL | INHALATION_SOLUTION | Freq: Four times a day (QID) | RESPIRATORY_TRACT | Status: DC | PRN

## 2022-06-20 MED ORDER — THIAMINE MONONITRATE 100 MG PO TABS
100.0000 mg | ORAL_TABLET | Freq: Every day | ORAL | Status: DC
  Administered 2022-06-20 – 2022-06-24 (×5): 100 mg via ORAL
  Filled 2022-06-20 (×6): qty 1

## 2022-06-20 MED ORDER — IODIXANOL 320 MG/ML IV SOLN
INTRAVENOUS | Status: DC | PRN
  Administered 2022-06-20: 40 mL

## 2022-06-20 SURGICAL SUPPLY — 24 items
BALLN LUTONIX 018 5X80X130 (BALLOONS) ×1
BALLN LUTONIX 018 6X60X130 (BALLOONS) ×1
BALLN LUTONIX DCB 7X60X130 (BALLOONS) ×1
BALLOON LUTONIX 018 5X80X130 (BALLOONS) IMPLANT
BALLOON LUTONIX 018 6X60X130 (BALLOONS) IMPLANT
BALLOON LUTONIX DCB 7X60X130 (BALLOONS) IMPLANT
CANISTER PENUMBRA ENGINE (MISCELLANEOUS) IMPLANT
CANNULA 5F STIFF (CANNULA) IMPLANT
CATH ANGIO 5F PIGTAIL 65CM (CATHETERS) IMPLANT
CATH INDIGO 7D KIT (CATHETERS) IMPLANT
CATH TEMPO 5F RIM 65CM (CATHETERS) IMPLANT
COVER PROBE ULTRASOUND 5X96 (MISCELLANEOUS) IMPLANT
DEVICE STARCLOSE SE CLOSURE (Vascular Products) IMPLANT
GLIDESHEATH SLENDER 7FR .021G (SHEATH) IMPLANT
GLIDEWIRE ADV .035X180CM (WIRE) IMPLANT
KIT ENCORE 26 ADVANTAGE (KITS) IMPLANT
PACK ANGIOGRAPHY (CUSTOM PROCEDURE TRAY) ×1 IMPLANT
SHEATH BRITE TIP 5FRX11 (SHEATH) IMPLANT
STENT VIABAHN 8X50X120 (Permanent Stent) IMPLANT
STENT VIABAHN5X120X8X (Permanent Stent) ×1 IMPLANT
SYR MEDRAD MARK 7 150ML (SYRINGE) IMPLANT
TUBING CONTRAST HIGH PRESS 72 (TUBING) IMPLANT
WIRE G 018X200 V18 (WIRE) IMPLANT
WIRE GUIDERIGHT .035X150 (WIRE) IMPLANT

## 2022-06-20 NOTE — H&P (Signed)
[facility] VE[facility] Admission History & Physical  MRN : [id number]  Christopher Bryan. is a 62 y.o. ([birth year]) male who presents with chief complaint of  Chief Complaint  Patient presents with   Leg Pain    Possible DVT  .  History of Present Illness: Known to service 82 yom h/o FEM- Fem bypass and multiple Left lower extremity interventions. Most recently August 2023, thrombectomy, angioplasty/stenting of fem fem. Patient Stopped taking his ASA/Plavix while incarcerated. Developed worsening rest pain ~1 week ago. Became severe this morning -presented for evaluation. Found to have ischemic LEFT lower extremity.  Current Facility-Administered Medications  Medication Dose Route Frequency Provider Last Rate Last Admin   heparin ADULT infusion 100 units/mL (25000 units/277mL)  900 Units/hr Intravenous Continuous Otelia Sergeant, RPH 9 mL/hr at 06/20/22 0349 900 Units/hr at 06/20/22 0349   metoprolol tartrate (LOPRESSOR) injection 5 mg  5 mg Intravenous Once Faust Thorington A, MD       Current Outpatient Medications  Medication Sig Dispense Refill   acetaminophen (TYLENOL) 325 MG tablet Take 650 mg by mouth 3 (three) times daily as needed for mild pain or fever.     albuterol (PROVENTIL HFA;VENTOLIN HFA) 108 (90 Base) MCG/ACT inhaler Inhale 2 puffs into the lungs every 6 (six) hours as needed for wheezing or shortness of breath.     amLODipine (NORVASC) 5 MG tablet Take 1 tablet (5 mg total) by mouth daily. 30 tablet 0   apixaban (ELIQUIS) 5 MG TABS tablet Take 1 tablet (5 mg total) by mouth 2 (two) times daily for 7 days. 14 tablet 0   atorvastatin (LIPITOR) 80 MG tablet Take 80 mg by mouth at bedtime.     budesonide-formoterol (SYMBICORT) 160-4.5 MCG/ACT inhaler Inhale 2 puffs into the lungs 2 (two) times daily. (Patient not taking: Reported on 09/07/2021) 1 Inhaler 12   carboxymethylcellulose (REFRESH PLUS) 0.5 % SOLN Place 1 drop into both eyes 4 (four) times daily as  needed. 15 mL 0   carvedilol (COREG) 6.25 MG tablet Take 1 tablet (6.25 mg total) by mouth 2 (two) times daily with a meal. 60 tablet 0   cholecalciferol (VITAMIN D) 1000 units tablet Take 2,000 Units by mouth daily.     clopidogrel (PLAVIX) 75 MG tablet Take 1 tablet (75 mg total) by mouth daily. 30 tablet 0   famotidine (PEPCID) 20 MG tablet Take 20 mg by mouth 2 (two) times daily as needed for heartburn or indigestion.     gabapentin (NEURONTIN) 400 MG capsule Take 1 capsule (400 mg total) by mouth 3 (three) times daily for 20 days. 60 capsule 0   hydrOXYzine (ATARAX) 25 MG tablet Take 25 mg by mouth 3 (three) times daily as needed (sleep).     ipratropium-albuterol (DUONEB) 0.5-2.5 (3) MG/3ML SOLN Take 3 mLs by nebulization every 6 (six) hours as needed. (Patient not taking: Reported on 01/08/2018) 100 mL 2   loratadine (CLARITIN) 10 MG tablet Take 10 mg by mouth daily as needed for allergies.     nicotine (NICODERM CQ - DOSED IN MG/24 HOURS) 21 mg/24hr patch Place 1 patch (21 mg total) onto the skin daily. 28 patch 0   ondansetron (ZOFRAN ODT) 4 MG disintegrating tablet Take 1 tablet (4 mg total) by mouth every 8 (eight) hours as needed for nausea or vomiting. (Patient not taking: Reported on 09/07/2021) 20 tablet 0   sennosides-docusate sodium (SENOKOT-S) 8.6-50 MG tablet Take 1-2 tablets by mouth at bedtime as  needed for constipation.     sildenafil (VIAGRA) 100 MG tablet Take 100 mg by mouth daily as needed for erectile dysfunction.     tamsulosin (FLOMAX) 0.4 MG CAPS capsule Take 0.4 mg by mouth daily after breakfast.     thiamine (VITAMIN B1) 100 MG tablet Take 100 mg by mouth daily.     tiotropium (SPIRIVA) 18 MCG inhalation capsule Place 18 mcg into inhaler and inhale daily.     traZODone (DESYREL) 50 MG tablet Take 25 mg by mouth at bedtime.      Past Medical History:  Diagnosis Date   COPD (chronic obstructive pulmonary disease) (HCC)    DVT (deep venous thrombosis) (HCC)     Hypertension    Peripheral arterial disease (HCC)     Past Surgical History:  Procedure Laterality Date   KNEE ARTHROSCOPY Left    LOWER EXTREMITY ANGIOGRAPHY Left 01/21/2020   Procedure: Lower Extremity Angiography;  Surgeon: Renford Dills, MD;  Location: [facility] INVASIVE CV LAB;  Service: Cardiovascular;  Laterality: Left;   LOWER EXTREMITY ANGIOGRAPHY Left 09/08/2021   Procedure: Lower Extremity Angiography;  Surgeon: Annice Needy, MD;  Location: [facility] INVASIVE CV LAB;  Service: Cardiovascular;  Laterality: Left;   ORIF FEMUR FRACTURE Right      Social History   Tobacco Use   Smoking status: Some Days   Smokeless tobacco: Never  Substance Use Topics   Alcohol use: Yes   Drug use: No     Family History  Problem Relation Age of Onset   CAD Mother    Emphysema Father     Allergies  Allergen Reactions   Iodine Rash   Ivp Dye [Iodinated Contrast Media] Hives     REVIEW OF SYSTEMS (Negative unless checked)  Constitutional: [] Weight loss  [] Fever  [] Chills Cardiac: [] Chest pain   [] Chest pressure   [] Palpitations   [] Shortness of breath when laying flat   [] Shortness of breath at rest   [] Shortness of breath with exertion. Vascular:  [x] Pain in legs with walking   [x] Pain in legs at rest   [] Pain in legs when laying flat   [] Claudication   [] Pain in feet when walking  [] Pain in feet at rest  [] Pain in feet when laying flat   [] History of DVT   [] Phlebitis   [] Swelling in legs   [] Varicose veins   [] Non-healing ulcers Pulmonary:   [] Uses home oxygen   [] Productive cough   [] Hemoptysis   [] Wheeze  [] COPD   [] Asthma Neurologic:  [] Dizziness  [] Blackouts   [] Seizures   [] History of stroke   [] History of TIA  [] Aphasia   [] Temporary blindness   [] Dysphagia   [] Weakness or numbness in arms   [] Weakness or numbness in legs Musculoskeletal:  [] Arthritis   [] Joint swelling   [] Joint pain   [] Low back pain Hematologic:  [] Easy bruising  [] Easy bleeding   [] Hypercoagulable state    [] Anemic  [] Hepatitis Gastrointestinal:  [] Blood in stool   [] Vomiting blood  [] Gastroesophageal reflux/heartburn   [] Difficulty swallowing. Genitourinary:  [] Chronic kidney disease   [] Difficult urination  [] Frequent urination  [] Burning with urination   [] Blood in urine Skin:  [] Rashes   [] Ulcers   [] Wounds Psychological:  [] History of anxiety   []  History of major depression.  Physical Examination  Vitals:   06/20/22 0323 06/20/22 0330 06/20/22 0354 06/20/22 0400  BP: (!) 193/68 (!) 186/82 (!) 206/82 (!) 206/79  Pulse: 94 93 91 93  Resp: 20     Temp:  TempSrc:      SpO2: 95%  100% 100%  Weight:      Height:       Body mass index is 19.77 kg/m. Gen: WD/WN, NAD Neck: Trachea midline.  No JVD.  Pulmonary:  Good air movement, respirations not labored, no use of accessory muscles.  Cardiac: RRR, normal S1, S2. Vascular: Fem fem- no pulse/no doppler Vessel Right Left  Radial Palpable Palpable                          PT  No doppler  DP monophasic No doppler   Gastrointestinal: soft, non-tender/non-distended. No guarding/reflex.  Musculoskeletal: Left lower extremity- ischemic, cool, no motor, no sensory ischemic changes.   Psychiatric: Judgment intact, Mood & affect appropriate for pt's clinical situation. Dermatologic: No rashes or ulcers noted.  No cellulitis or open wounds.      CBC Lab Results  Component Value Date   WBC 5.1 06/20/2022   HGB 12.0 (L) 06/20/2022   HCT 37.2 (L) 06/20/2022   MCV 89.0 06/20/2022   PLT 329 06/20/2022    BMET    Component Value Date/Time   NA 131 (L) 06/20/2022 0106   K 4.5 06/20/2022 0106   CL 96 (L) 06/20/2022 0106   CO2 24 06/20/2022 0106   GLUCOSE 96 06/20/2022 0106   BUN 21 06/20/2022 0106   CREATININE 1.27 (H) 06/20/2022 0106   CALCIUM 8.8 (L) 06/20/2022 0106   GFRNONAA >60 06/20/2022 0106   GFRAA >60 01/11/2018 0356   Estimated Creatinine Clearance: 49 mL/min (A) (by C-G formula based on SCr of 1.27 mg/dL  (H)).  COAG Lab Results  Component Value Date   INR 1.1 06/20/2022   INR 1.0 09/06/2021   INR 1.2 10/26/2020    Radiology US Venous Img Lower Unilateral Left  Result Date: 06/20/2022 CLINICAL DATA:  Rule out DVT. EXAM: Left LOWER EXTREMITY VENOUS DOPPLER ULTRASOUND TECHNIQUE: Gray-scale sonography with graded compression, as well as color Doppler and duplex ultrasound were performed to evaluate the lower extremity deep venous systems from the level of the common femoral vein and including the common femoral, femoral, profunda femoral, popliteal and calf veins including the posterior tibial, peroneal and gastrocnemius veins when visible. The superficial great saphenous vein was also interrogated. Spectral Doppler was utilized to evaluate flow at rest and with distal augmentation maneuvers in the common femoral, femoral and popliteal veins. COMPARISON:  None Available. FINDINGS: Contralateral Common Femoral Vein: Occlusive thrombus in the right common femoral vein. There is noncompressibility of this vessel. Common Femoral Vein: No evidence of thrombus. Normal compressibility, respiratory phasicity and response to augmentation. Saphenofemoral Junction: No evidence of thrombus. Normal compressibility and flow on color Doppler imaging. Profunda Femoral Vein: No evidence of thrombus. Normal compressibility and flow on color Doppler imaging. Femoral Vein: There is partially occlusive thrombus in the left femoral vein. There is high-grade luminal narrowing and non compressibility. Popliteal Vein: There is partially occlusive thrombus in the popliteal vein with high-grade luminal narrowing. There is non compressibility of the popliteal vein. Calf Veins: Partially occlusive thrombus in the posterior tibial vein with high-grade luminal narrowing and non compressibility. Superficial Great Saphenous Vein: No evidence of thrombus. Normal compressibility. Venous Reflux:  None. Other Findings:  None. IMPRESSION:  Bilateral lower extremity DVT. These results were called by telephone at the time of interpretation on 06/20/2022 at 1:58 am to provider JADE SUNG , who verbally acknowledged these results. Electronically Signed   By:  Elgie Collard M.D.   On: 06/20/2022 02:00     Assessment/Plan 1. Ischemia LEFT lower extremity- severe, currently without motor or sensory. High risk of limb loss. Will take emergently to Angio for attempted revascularization. Risks benefits and alternatives explained, all questions answered, consent obtained. 2. Thrombosed Fem-Fem bypass    Bertram Denver, MD  06/20/2022 4:13 AM

## 2022-06-20 NOTE — ED Provider Notes (Signed)
[facility] Provider Note    Event Date/Time   First MD Initiated Contact with Patient 06/20/22 [phone]     (approximate)   History   Chief Complaint Leg Pain (Possible DVT)   HPI  Christopher Bryan. is a 62 y.o. male with past medical history of hypertension, PAD, CHF, COPD, and DVT who presents to the ED complaining of leg pain.  Patient reports that he has been dealing with increasing pain in his left leg for the past 3 days, states pain initially extended from his knee down into his foot.  He eventually called EMS for this pain earlier this evening, but states that since he has arrived to the hospital, pain has gone from moderate to severe.  He has not noticed any swelling in the leg and denies any redness or wounds.  He does state that he was off Eliquis and Plavix from about December to March of this year while he was incarcerated, states he has been compliant with the medication since then.     Physical Exam   Triage Vital Signs: ED Triage Vitals [06/20/22 0103]  Enc Vitals Group     BP (!) 147/101     Pulse Rate 100     Resp 18     Temp 98 F (36.7 C)     Temp Source Oral     SpO2 94 %     Weight 130 lb (59 kg)     Height 5\' 8"  (1.727 m)     Head Circumference      Peak Flow      Pain Score 10     Pain Loc      Pain Edu?      Excl. in GC?     Most recent vital signs: Vitals:   06/20/22 0103 06/20/22 0323  BP: (!) 147/101 (!) 193/68  Pulse: 100 94  Resp: 18   Temp: 98 F (36.7 C)   SpO2: 94% 95%    Constitutional: Alert and oriented. Eyes: Conjunctivae are normal. Head: Atraumatic. Nose: No congestion/rhinnorhea. Mouth/Throat: Mucous membranes are moist.  Cardiovascular: Normal rate, regular rhythm. Grossly normal heart sounds.  2+ radial pulses bilaterally. Respiratory: Normal respiratory effort.  No retractions. Lungs CTAB. Gastrointestinal: Soft and nontender. No distention. Musculoskeletal: Left lower extremity with no  edema or tenderness.  Extremity is cool to touch from knee down, unable to obtain Doppler signals at [facility] or PT. Neurologic:  Normal speech and language. No gross focal neurologic deficits are appreciated.    ED Results / Procedures / Treatments   Labs (all labs ordered are listed, but only abnormal results are displayed) Labs Reviewed  CBC - Abnormal; Notable for the following components:      Result Value   RBC 4.18 (*)    Hemoglobin 12.0 (*)    HCT 37.2 (*)    All other components within normal limits  BASIC METABOLIC PANEL - Abnormal; Notable for the following components:   Sodium 131 (*)    Chloride 96 (*)    Creatinine, Ser 1.27 (*)    Calcium 8.8 (*)    All other components within normal limits  D-DIMER, QUANTITATIVE - Abnormal; Notable for the following components:   D-Dimer, Quant 1.26 (*)    All other components within normal limits  PROTIME-INR  APTT   RADIOLOGY Lower extremity ultrasound reviewed and interpreted by me with occlusive DVT at right common femoral vein.  PROCEDURES:  Critical Care performed: Yes,  see critical care procedure note(s)  .Critical Care  Performed by: Chesley Noon, MD Authorized by: Chesley Noon, MD   Critical care provider statement:    Critical care time (minutes):  30   Critical care time was exclusive of:  Separately billable procedures and treating other patients and teaching time   Critical care was necessary to treat or prevent imminent or life-threatening deterioration of the following conditions:  Circulatory failure   Critical care was time spent personally by me on the following activities:  Development of treatment plan with patient or surrogate, discussions with consultants, evaluation of patient's response to treatment, examination of patient, ordering and review of laboratory studies, ordering and review of radiographic studies, ordering and performing treatments and interventions, pulse oximetry, re-evaluation of  patient's condition and review of old charts   I assumed direction of critical care for this patient from another provider in my specialty: no     Care discussed with: admitting provider      MEDICATIONS ORDERED IN ED: Medications  HYDROmorphone (DILAUDID) injection 1 mg (1 mg Intravenous Given 06/20/22 0319)     IMPRESSION / MDM / ASSESSMENT AND PLAN / ED COURSE  I reviewed the triage vital signs and the nursing notes.                              62 y.o. male with past medical history of hypertension, PAD, CHF, COPD, and DVT who presents to the ED complaining of 3 days of pain moving down his left leg that has become severe since he has arrived to the ED.  Patient's presentation is most consistent with acute presentation with potential threat to life or bodily function.  Differential diagnosis includes, but is not limited to, limb ischemia, DVT, cellulitis.  Patient uncomfortable appearing, currently in severe pain and rolling around on the stretcher.  Left lower extremity is cool to touch and I am unable to get Doppler signals at his DP or PT.  Patient states that pain was not as severe when he first arrived to the hospital, but has since become as severe as it is now.  We will treat symptomatically with IV Dilaudid, start on heparin due to concern for limb ischemia.  Case discussed with Dr. Evie Lacks of vascular surgery, who will evaluate the patient.  Labs without significant anemia, leukocytosis, electrolyte abnormality, or AKI.  D-dimer noted to be elevated.  Case discussed with hospitalist for admission.      FINAL CLINICAL IMPRESSION(S) / ED DIAGNOSES   Final diagnoses:  Limb ischemia  Acute deep vein thrombosis (DVT) of left lower extremity, unspecified vein (HCC)     Rx / DC Orders   ED Discharge Orders     None        Note:  This document was prepared using Dragon voice recognition software and may include unintentional dictation errors.   Chesley Noon,  MD 06/20/22 [phone]

## 2022-06-20 NOTE — ED Notes (Signed)
MD notified of pt's pain at this time.

## 2022-06-20 NOTE — ED Notes (Signed)
Pt gave permission to talk with sister Malachi Bonds and she spoke with the pt on this RN's phone.

## 2022-06-20 NOTE — ED Notes (Signed)
ED Provider at bedside. 

## 2022-06-20 NOTE — ED Notes (Signed)
Attempted to call report to ICU, staff stated they would have to call back. Consulting civil engineer notified.

## 2022-06-20 NOTE — Progress Notes (Signed)
ANTICOAGULATION CONSULT NOTE  Pharmacy Consult for heparin infusion Indication: Limb ischemia/DVT  Allergies  Allergen Reactions   Iodine Rash   Ivp Dye [Iodinated Contrast Media] Hives    Patient Measurements: Height: 5\' 8"  (172.7 cm) Weight: 59 kg (130 lb) IBW/kg (Calculated) : 68.4 Heparin Dosing Weight: 59 kg  Vital Signs: Temp: 98 F (36.7 C) (05/20 0103) Temp Source: Oral (05/20 0103) BP: 193/68 (05/20 0323) Pulse Rate: 94 (05/20 0323)  Labs: Recent Labs    06/20/22 0106  HGB 12.0*  HCT 37.2*  PLT 329  CREATININE 1.27*    Estimated Creatinine Clearance: 49 mL/min (A) (by C-G formula based on SCr of 1.27 mg/dL (H)).   Medical History: Past Medical History:  Diagnosis Date   COPD (chronic obstructive pulmonary disease) (HCC)    DVT (deep venous thrombosis) (HCC)    Hypertension    Peripheral arterial disease (HCC)     Medications:  PTA Meds: Hx of Apixaban 5 mg, last dose unknown   Assessment: Pt is a 62 yo male presenting to ED c/o leg pain found with multiple occlusive & partially occlusive thrombi in lower extremities.   Goal of Therapy:  Heparin level 0.3-0.7 units/ml aPTT 66-102 seconds Monitor platelets by anticoagulation protocol: Yes   Plan:  Bolus 3600 units x 1 Start heparin infusion at 900 units/hr Will follow aPTT until correlation w/ HL confirmed Will check aPTT in 6 hr after start of infusion HL & CBC daily while on heparin  Otelia Sergeant, PharmD, [facility] 06/20/2022 3:35 AM

## 2022-06-20 NOTE — ED Notes (Signed)
MD Esco at bedside assessing pt

## 2022-06-20 NOTE — Interval H&P Note (Signed)
History and Physical Interval Note:  06/20/2022 6:56 AM  Christopher Bryan.  has presented today for surgery, with the diagnosis of Ischemia Left lower extremity.  The various methods of treatment have been discussed with the patient and family. After consideration of risks, benefits and other options for treatment, the patient has consented to  Procedure(s): Lower Extremity Angiography (Left) as a surgical intervention.  The patient's history has been reviewed, patient examined, no change in status, stable for surgery.  I have reviewed the patient's chart and labs.  Questions were answered to the patient's satisfaction.     Festus Barren

## 2022-06-20 NOTE — ED Notes (Signed)
PT brought to ed rm 15 at this time, this RN now assuming care.  

## 2022-06-20 NOTE — Progress Notes (Signed)
eLink Physician-Brief Progress Note Patient Name: Christopher Bryan. DOB: 02-26-1960 MRN: [id number]   Date of Service  06/20/2022  HPI/Events of Note  20M with hx recurrent b/l LE DVT, s/p multiple LE interventions including thrombectomy, angioplasty and stenting. Presented with resting leg pain x 1 week. Had stopped ASA/Plavix while incarcerated. Vascular consulted for ischemic LLE and emergently taken for angiography/revascularization  LE [place] + Bilateral DVTs  SBP elevated 175  eICU Interventions  Will notify bedside RN to contact primary team for BP  Elink available as needed for urgent needs        Tattiana Fakhouri Mechele Collin 06/20/2022, 5:31 AM

## 2022-06-20 NOTE — Interval H&P Note (Signed)
History and Physical Interval Note:  06/20/2022 6:57 AM  Christopher Bryan.  has presented today for surgery, with the diagnosis of Ischemia Left lower extremity.  The various methods of treatment have been discussed with the patient and family. After consideration of risks, benefits and other options for treatment, the patient has consented to  Procedure(s): Lower Extremity Angiography (Left) as a surgical intervention.  The patient's history has been reviewed, patient examined, no change in status, stable for surgery.  I have reviewed the patient's chart and labs.  Questions were answered to the patient's satisfaction.     Festus Barren

## 2022-06-20 NOTE — Op Note (Signed)
[facility]  Percutaneous Study/Intervention Procedural Note   Date of Surgery: 06/20/2022  Surgeon(s):Jeremie Abdelaziz    Assistants:none  Pre-operative Diagnosis: PAD with rest pain LLE  Post-operative diagnosis:  Same  Procedure(s) Performed:             1.  Ultrasound guidance for vascular access right femoral artery             2.  Catheter placement into left profunda femoris artery from right femoral approach             3.  Bilateral lower extremity angiograms             4.  Mechanical thrombectomy of the right to left femoral-femoral bypass on the left profunda femoris artery with the penumbra CAT 7D device             5.  Percutaneous transluminal angioplasty of the right femoral anastomosis analogous to the right common femoral artery with 6 mm diameter Lutonix drug-coated angioplasty balloon  6.  Stent placement to the most proximal portion of the right to left femoral-femoral bypass just beyond the right femoral anastomosis with 8 mm diameter by 5 cm length Viabahn stent  7.  Angioplasty of the left femoral to femoral bypass and no stenosis and profunda femoris artery with a 5 mm Lutonix drug-coated angioplasty balloon  8.  Catheter directed thrombolytic therapy with 4 mg of tPA to the left femoral to femoral bypass.             9.  StarClose closure device right femoral artery  EBL: 150 cc  Contrast: 40 cc  Fluoro Time: 5.5 minutes  Moderate Conscious Sedation Time: approximately 41 minutes using 3 mg of Versed and 50 mcg of Fentanyl              Indications:  Patient is a 62 y.o.male with a profoundly ischemic left lower extremity.  He has had a right to left femoral-femoral bypass that is already had 2 previous interventions and apparently had noncompliance with medications.  The patient is brought in for angiography for further evaluation and potential treatment.  Due to the limb threatening nature of the situation, angiogram was performed for  attempted limb salvage. The patient is aware that if the procedure fails, amputation would be expected.  The patient also understands that even with successful revascularization, amputation may still be required due to the severity of the situation.  Risks and benefits are discussed and informed consent is obtained.   Procedure:  The patient was identified and appropriate procedural time out was performed.  The patient was then placed supine on the table and prepped and draped in the usual sterile fashion. Moderate conscious sedation was administered during a face to face encounter with the patient throughout the procedure with my supervision of the RN administering medicines and monitoring the patient's vital signs, pulse oximetry, telemetry and mental status throughout from the start of the procedure until the patient was taken to the recovery room. Ultrasound was used to evaluate the right common femoral artery.  It was patent .  A digital ultrasound image was acquired.  A micropuncture needle was used to access the most distal right common femoral artery under direct ultrasound guidance and a permanent image was performed.  A micropuncture wire and sheath were then placed.  A 0.035 J wire was advanced without resistance and a 5Fr sheath was placed.  Imaging was then performed through the right femoral sheath.  The aorta and the right iliacs were patent including the previously placed stents.  The right common femoral artery, profunda femoris artery, and proximal portion superficial femoral artery are patent.  The bypass was occluded with a tiny stump present at the origin of the right to left femoral to femoral bypass.  Then I used a rim catheter and advantage wire to get into the femoral to femoral bypass and cross the occluded bypass without difficulty getting into the left profunda femoris artery.  Selective left lower extremity angiogram was then performed. This demonstrated a stent across the distal  bypass anastomosis going into the proximal profunda femoris artery.  The profunda femoris was occluded proximally with thrombus but then reconstituted and was the only flow distally as the SFA was chronically occluded. It was felt that it was in the patient's best interest to proceed with intervention after these images to avoid a second procedure and a larger amount of contrast and fluoroscopy based off of the findings from the initial angiogram. The patient was systemically heparinized and a 7 French slender sheath was then placed over the Terumo Advantage wire. I then exchanged for a V18 wire.  I instilled 4 mg of tPA through the femoral-femoral bypass and into the left profunda femoris artery for thrombolytic therapy.  Mechanical thrombectomy was performed with 3 passes with the penumbra CAT 7D device.  We went throughout the entirety of the femoral to femoral bypass graft and into the left profunda femoris artery with large chunks of thrombus removed.  There was thrombus and stenosis just beyond the femoral to femoral bypass anastomosis proximally as well as thrombus and stenosis in the distal bypass anastomosis of the left and into the profunda femoris artery.  On the left, a 5 mm diameter by 8 cm length Lutonix drug-coated angioplasty balloon was inflated to 8 atm for 1 minute.  On the right common to the common femoral artery and the proximal bypass anastomosis a 6 mm diameter by 6 cm length Lutonix drug-coated angioplasty balloon was inflated to 10 atm for 1 minute.  Completion imaging showed less than 20% residual stenosis on the left side in the common femoral profunda femoris artery.  There remained thrombus and stenosis just into the bypass graft anastomosis and I elected to place a short 8 mm diameter by 5 cm length Viabahn stent in this location taking care not to impinge upon the flow on the right leg.  This was deployed at the origin of the femoral-femoral bypass on the right and postdilated with a  7 mm balloon with excellent angiographic completion result and less than 10% residual stenosis.  I elected to terminate the procedure. The sheath was removed and StarClose closure device was deployed in the right femoral artery with excellent hemostatic result. The patient was taken to the recovery room in stable condition having tolerated the procedure well.  Findings:                           Right Lower Extremity: The aorta and the right iliacs were patent including the previously placed stents.  The right common femoral artery, profunda femoris artery, and proximal portion superficial femoral artery are patent.  The bypass was occluded with a tiny stump present at the origin of the right to left femoral to femoral bypass.  Left Lower Extremity: This demonstrated a stent across the distal bypass anastomosis going into the proximal profunda femoris artery.  The profunda femoris  was occluded proximally with thrombus but then reconstituted and was the only flow distally as the SFA was chronically occluded   Disposition: Patient was taken to the recovery room in stable condition having tolerated the procedure well.  Complications: None  Christopher Bryan 06/20/2022 8:27 AM   This note was created with Dragon Medical transcription system. Any errors in dictation are purely unintentional.

## 2022-06-20 NOTE — ED Triage Notes (Addendum)
Pt to ED from home for Left sided leg pain via [facility]. Pt has HX of DVT. Pt on plavix and eliquis. Pt is CAOx4, in no acute distress. Pt left leg is cooler than the right. Pt states he is unable to feel his foot at all either.   Pt advised it has been like this for 3-4 days.

## 2022-06-21 ENCOUNTER — Encounter: Payer: Self-pay | Admitting: Vascular Surgery

## 2022-06-21 DIAGNOSIS — T82868A Thrombosis of vascular prosthetic devices, implants and grafts, initial encounter: Secondary | ICD-10-CM | POA: Diagnosis not present

## 2022-06-21 DIAGNOSIS — I70222 Atherosclerosis of native arteries of extremities with rest pain, left leg: Secondary | ICD-10-CM | POA: Diagnosis not present

## 2022-06-21 DIAGNOSIS — T85858A Stenosis due to other internal prosthetic devices, implants and grafts, initial encounter: Secondary | ICD-10-CM | POA: Diagnosis not present

## 2022-06-21 DIAGNOSIS — I743 Embolism and thrombosis of arteries of the lower extremities: Secondary | ICD-10-CM | POA: Diagnosis not present

## 2022-06-21 MED ORDER — OXYCODONE-ACETAMINOPHEN 5-325 MG PO TABS
1.0000 | ORAL_TABLET | Freq: Four times a day (QID) | ORAL | Status: DC | PRN
  Administered 2022-06-21 – 2022-06-23 (×7): 2 via ORAL
  Filled 2022-06-21 (×7): qty 2

## 2022-06-21 NOTE — Progress Notes (Signed)
[facility]  Daily Progress Note   Subjective  -   Pain in left foot is much better.  Has not been up and walking at all.  Aggrastat drip running and to stop this morning.  Objective Vitals:   06/21/22 0500 06/21/22 0600 06/21/22 0700 06/21/22 0800  BP: (!) 115/56 (!) 109/55 103/64 116/83  Pulse: 75 68 68 74  Resp: 15 (!) 22 14 17   Temp:    98.8 F (37.1 C)  TempSrc:    Oral  SpO2: 93% 97% 93% 94%  Weight:      Height:        Intake/Output Summary (Last 24 hours) at 06/21/2022 0939 Last data filed at 06/21/2022 0600 Gross per 24 hour  Intake 2225.48 ml  Output 500 ml  Net 1725.48 ml    PULM  CTAB CV  RRR VASC  left foot is warm with good capillary refill.  Laboratory CBC    Component Value Date/Time   WBC 5.1 06/20/2022 0106   HGB 12.0 (L) 06/20/2022 0106   HCT 37.2 (L) 06/20/2022 0106   PLT 329 06/20/2022 0106    BMET    Component Value Date/Time   NA 131 (L) 06/20/2022 0106   K 4.5 06/20/2022 0106   CL 96 (L) 06/20/2022 0106   CO2 24 06/20/2022 0106   GLUCOSE 96 06/20/2022 0106   BUN 21 06/20/2022 0106   CREATININE 1.27 (H) 06/20/2022 0106   CALCIUM 8.8 (L) 06/20/2022 0106   GFRNONAA >60 06/20/2022 0106   GFRAA >60 01/11/2018 0356    Assessment/Planning: POD # 1 s/p percutaneous revascularization of the left lower extremity with successful recanalization of the right to left femoral to femoral bypass for rest pain with neurologic changes  Foot is warm and well-perfused.  Has chronic left SFA occlusion so pulses not palpable, but large profunda providing collateral flow distally was his baseline Still has some neurologic deficits in the left foot so we will need to see how he does with activity Aggrastat to stop this morning and can transition over to oral anticoagulation Up and out of bed May need PT consult    Festus Barren  06/21/2022, 9:39 AM

## 2022-06-22 DIAGNOSIS — I70222 Atherosclerosis of native arteries of extremities with rest pain, left leg: Secondary | ICD-10-CM

## 2022-06-22 DIAGNOSIS — Z95828 Presence of other vascular implants and grafts: Secondary | ICD-10-CM

## 2022-06-22 DIAGNOSIS — Z9889 Other specified postprocedural states: Secondary | ICD-10-CM

## 2022-06-22 NOTE — Evaluation (Signed)
Physical Therapy Evaluation Patient Details Name: Christopher Bryan. MRN: [id number] DOB: November 10, 1960 Today's Date: 06/22/2022  History of Present Illness  Pt admitted for ischemia of L LE s/p percutaneous revascularization with B femoral bypass. History includes COPD, HTN, and PAD. Pt reports 10/10 pain and was premedicated prior to session.  Clinical Impression  Pt is a pleasant 62 year old male who was admitted for ischemia of L LE and is s/p B LE femoral bypass. Pt performs bed mobility with independence, transfers with mod I, and ambulation with supervision and RW. Pt demonstrates deficits with pain/mobility. Pt is eager to participate. Would benefit from skilled PT to address above deficits and promote optimal return to PLOF. Pt will continue to receive skilled PT services while admitted and will defer to TOC/care team for updates regarding disposition planning.      Recommendations for follow up therapy are one component of a multi-disciplinary discharge planning process, led by the attending physician.  Recommendations may be updated based on patient status, additional functional criteria and insurance authorization.  Follow Up Recommendations       Assistance Recommended at Discharge Set up Supervision/Assistance  Patient can return home with the following  A little help with walking and/or transfers;Help with stairs or ramp for entrance    Equipment Recommendations Rolling walker (2 wheels)  Recommendations for Other Services       Functional Status Assessment Patient has had a recent decline in their functional status and demonstrates the ability to make significant improvements in function in a reasonable and predictable amount of time.     Precautions / Restrictions Precautions Precautions: Fall Restrictions Weight Bearing Restrictions: No      Mobility  Bed Mobility Overal bed mobility: Independent             General bed mobility comments: safe technique  with ease of transition    Transfers Overall transfer level: Modified independent Equipment used: Rolling walker (2 wheels)               General transfer comment: adjusted RW to correct height.    Ambulation/Gait Ambulation/Gait assistance: Supervision Gait Distance (Feet): 40 Feet Assistive device: Rolling walker (2 wheels) Gait Pattern/deviations: Step-to pattern       General Gait Details: heavy cues for sequencing of RW with tendency to take large steps with L LE and get too close to AD. Improvement noted with increased distance.  Stairs            Wheelchair Mobility    Modified Rankin (Stroke Patients Only)       Balance Overall balance assessment: Needs assistance Sitting-balance support: Feet supported Sitting balance-Leahy Scale: Good     Standing balance support: Bilateral upper extremity supported Standing balance-Leahy Scale: Good                               Pertinent Vitals/Pain Pain Assessment Pain Assessment: 0-10 Pain Score: 10-Worst pain ever Pain Location: L leg Pain Descriptors / Indicators: Operative site guarding Pain Intervention(s): Limited activity within patient's tolerance    Home Living Family/patient expects to be discharged to:: Shelter/Homeless                        Prior Function Prior Level of Function : Independent/Modified Independent;History of Falls (last six months)             Mobility Comments: reports 1 recent  fall secondary to L LE numbness and was previously using SPC for all mobility. ADLs Comments: indep     Hand Dominance        Extremity/Trunk Assessment   Upper Extremity Assessment Upper Extremity Assessment: Overall WFL for tasks assessed    Lower Extremity Assessment Lower Extremity Assessment: Generalized weakness (L LE grossly 4/5; R LE grossly 5/5)       Communication   Communication: No difficulties  Cognition Arousal/Alertness: Awake/alert Behavior  During Therapy: WFL for tasks assessed/performed Overall Cognitive Status: Within Functional Limits for tasks assessed                                          General Comments      Exercises Other Exercises Other Exercises: ambulated to bathroom with supervision. Able to perform hygiene with safe technique and mod I   Assessment/Plan    PT Assessment Patient needs continued PT services  PT Problem List Decreased strength;Decreased mobility;Decreased knowledge of use of DME;Pain       PT Treatment Interventions Gait training;Therapeutic exercise;DME instruction    PT Goals (Current goals can be found in the Care Plan section)  Acute Rehab PT Goals Patient Stated Goal: to go home PT Goal Formulation: With patient Time For Goal Achievement: 07/06/22 Potential to Achieve Goals: Good    Frequency Min 2X/week     Co-evaluation               AM-PAC PT "6 Clicks" Mobility  Outcome Measure Help needed turning from your back to your side while in a flat bed without using bedrails?: None Help needed moving from lying on your back to sitting on the side of a flat bed without using bedrails?: None Help needed moving to and from a bed to a chair (including a wheelchair)?: None Help needed standing up from a chair using your arms (e.g., wheelchair or bedside chair)?: None Help needed to walk in hospital room?: A Little Help needed climbing 3-5 steps with a railing? : A Little 6 Click Score: 22    End of Session   Activity Tolerance: Patient tolerated treatment well Patient left: in chair Nurse Communication: Mobility status PT Visit Diagnosis: Muscle weakness (generalized) (M62.81);History of falling (Z91.81);Difficulty in walking, not elsewhere classified (R26.2);Pain Pain - Right/Left: Left Pain - part of body: Leg    Time: 1610-9604 PT Time Calculation (min) (ACUTE ONLY): 19 min   Charges:   PT Evaluation $PT Eval Low Complexity: 1 Low PT  Treatments $Gait Training: 8-22 mins        Elizabeth Palau, PT, DPT, GCS [phone]   Christopher Bryan 06/22/2022, 2:22 PM

## 2022-06-22 NOTE — Progress Notes (Signed)
[facility]  Daily Progress Note   Subjective  -   Patient resting in a chair.  Notes that the walking is much better but he still is having continued pain and tenderness in his leg  Objective Vitals:   06/21/22 1739 06/21/22 2055 06/22/22 0359 06/22/22 0843  BP: (!) 131/48 (!) 124/54 (!) 136/47 (!) 141/74  Pulse: 81 66 64 86  Resp: 16 18 18 18   Temp: 98.5 F (36.9 C) 99.1 F (37.3 C) 98.5 F (36.9 C) 98.3 F (36.8 C)  TempSrc:  Oral Oral   SpO2: 98% 96% 100%   Weight:      Height:        Intake/Output Summary (Last 24 hours) at 06/22/2022 1328 Last data filed at 06/22/2022 0500 Gross per 24 hour  Intake 690 ml  Output 1075 ml  Net -385 ml    PULM  CTAB CV  RRR VASC  soft groin with warm left foot  Laboratory CBC    Component Value Date/Time   WBC 5.1 06/20/2022 0106   HGB 12.0 (L) 06/20/2022 0106   HCT 37.2 (L) 06/20/2022 0106   PLT 329 06/20/2022 0106    BMET    Component Value Date/Time   NA 131 (L) 06/20/2022 0106   K 4.5 06/20/2022 0106   CL 96 (L) 06/20/2022 0106   CO2 24 06/20/2022 0106   GLUCOSE 96 06/20/2022 0106   BUN 21 06/20/2022 0106   CREATININE 1.27 (H) 06/20/2022 0106   CALCIUM 8.8 (L) 06/20/2022 0106   GFRNONAA >60 06/20/2022 0106   GFRAA >60 01/11/2018 0356    Assessment/Planning: POD #2 s/p percutaneous revascularization of the left lower extremity with successful recanalization of the right to left femoral to femoral bypass for rest pain with neurologic changes   Leg continues to remain warm this morning.  Patient has nonpalpable pulses due to known chronic left SFA occlusion, but he does have strong collaterals provided by his profunda The patient continues to have pain postintervention which is likely related to reperfusion pain in addition to neuropathic pain. Patient was able to get up and walk with PT, continued activity is encouraged Patient will stay 1 additional night due to issues with pain control  we will plan on likely discharge tomorrow.  Georgiana Spinner  06/22/2022, 1:28 PM

## 2022-06-23 DIAGNOSIS — R0989 Other specified symptoms and signs involving the circulatory and respiratory systems: Secondary | ICD-10-CM | POA: Diagnosis not present

## 2022-06-23 DIAGNOSIS — Z95828 Presence of other vascular implants and grafts: Secondary | ICD-10-CM | POA: Diagnosis not present

## 2022-06-23 DIAGNOSIS — I70222 Atherosclerosis of native arteries of extremities with rest pain, left leg: Secondary | ICD-10-CM | POA: Diagnosis not present

## 2022-06-23 DIAGNOSIS — Z9889 Other specified postprocedural states: Secondary | ICD-10-CM | POA: Diagnosis not present

## 2022-06-23 LAB — BASIC METABOLIC PANEL
Anion gap: 7 (ref 5–15)
BUN: 27 mg/dL — ABNORMAL HIGH (ref 8–23)
CO2: 25 mmol/L (ref 22–32)
Calcium: 8.7 mg/dL — ABNORMAL LOW (ref 8.9–10.3)
Chloride: 102 mmol/L (ref 98–111)
Creatinine, Ser: 0.98 mg/dL (ref 0.61–1.24)
GFR, Estimated: >60 mL/min (ref >60)
Glucose, Bld: 107 mg/dL — ABNORMAL HIGH (ref 70–99)
Potassium: 4.2 mmol/L (ref 3.5–5.1)
Sodium: 134 mmol/L — ABNORMAL LOW (ref 135–145)

## 2022-06-23 NOTE — Progress Notes (Signed)
Physical Therapy Treatment Patient Details Name: Christopher Bryan. MRN: [id number] DOB: 01-Oct-1960 Today's Date: 06/23/2022   History of Present Illness Pt admitted for ischemia of L LE s/p percutaneous revascularization with B femoral bypass. History includes COPD, HTN, and PAD. Pt reports 10/10 pain and was premedicated prior to session.    PT Comments    Pt is making good progress towards goals with ability to ambulate in hallway using RW. Still demonstrates antalgic gait pattern. Discussed multiple ADs, however continue to feel RW would be best suited for continued independence. Will continue to progress.   Recommendations for follow up therapy are one component of a multi-disciplinary discharge planning process, led by the attending physician.  Recommendations may be updated based on patient status, additional functional criteria and insurance authorization.  Follow Up Recommendations       Assistance Recommended at Discharge Set up Supervision/Assistance  Patient can return home with the following A little help with walking and/or transfers;Help with stairs or ramp for entrance   Equipment Recommendations  Rolling walker (2 wheels)    Recommendations for Other Services       Precautions / Restrictions Precautions Precautions: Fall Restrictions Weight Bearing Restrictions: No     Mobility  Bed Mobility Overal bed mobility: Independent             General bed mobility comments: safe technique with ease of transition    Transfers Overall transfer level: Modified independent Equipment used: Rolling walker (2 wheels)               General transfer comment: stands prior to therapist insturction. RW used once standing    Ambulation/Gait Ambulation/Gait assistance: Modified independent (Device/Increase time) Gait Distance (Feet): 100 Feet Assistive device: Rolling walker (2 wheels) Gait Pattern/deviations: Step-through pattern       General Gait  Details: ambulated using RW with increased stepping on L side despite cues. Reports continued numbness.   Stairs             Wheelchair Mobility    Modified Rankin (Stroke Patients Only)       Balance Overall balance assessment: Needs assistance Sitting-balance support: Feet supported Sitting balance-Leahy Scale: Good     Standing balance support: Bilateral upper extremity supported Standing balance-Leahy Scale: Good                              Cognition Arousal/Alertness: Awake/alert Behavior During Therapy: WFL for tasks assessed/performed Overall Cognitive Status: Within Functional Limits for tasks assessed                                 General Comments: agreeable to session        Exercises      General Comments        Pertinent Vitals/Pain Pain Assessment Pain Assessment: 0-10 Pain Score: 8  Pain Location: L leg Pain Descriptors / Indicators: Operative site guarding Pain Intervention(s): Limited activity within patient's tolerance, Premedicated before session    Home Living                          Prior Function            PT Goals (current goals can now be found in the care plan section) Acute Rehab PT Goals Patient Stated Goal: to go home PT Goal Formulation: With  patient Time For Goal Achievement: 07/06/22 Potential to Achieve Goals: Good Progress towards PT goals: Progressing toward goals    Frequency    Min 2X/week      PT Plan Current plan remains appropriate    Co-evaluation              AM-PAC PT "6 Clicks" Mobility   Outcome Measure  Help needed turning from your back to your side while in a flat bed without using bedrails?: None Help needed moving from lying on your back to sitting on the side of a flat bed without using bedrails?: None Help needed moving to and from a bed to a chair (including a wheelchair)?: None Help needed standing up from a chair using your arms  (e.g., wheelchair or bedside chair)?: None Help needed to walk in hospital room?: None Help needed climbing 3-5 steps with a railing? : A Little 6 Click Score: 23    End of Session Equipment Utilized During Treatment: Gait belt Activity Tolerance: Patient tolerated treatment well Patient left: in bed Nurse Communication: Mobility status PT Visit Diagnosis: Muscle weakness (generalized) (M62.81);History of falling (Z91.81);Difficulty in walking, not elsewhere classified (R26.2);Pain Pain - Right/Left: Left Pain - part of body: Leg     Time: 1520-1531 PT Time Calculation (min) (ACUTE ONLY): 11 min  Charges:  $Gait Training: 8-22 mins                     Elizabeth Palau, PT, DPT, GCS [phone]    Lazar Tierce 06/23/2022, 4:25 PM

## 2022-06-23 NOTE — Progress Notes (Signed)
SATURATION QUALIFICATIONS: (This note is used to comply with regulatory documentation for home oxygen)  Patient Saturations on Room Air at Rest = 96%  Patient Saturations on Room Air while Ambulating = 100%   

## 2022-06-23 NOTE — Discharge Instructions (Signed)
Intensive Outpatient Programs   [facility] [address] [place],  [place], [place] [phone]  [facility] (Inpatient and [phone] (Suboxone and Methadone) [address] [phone]  ADS: Alcohol & Drug [facility] - Intensive Outpatient [address] [place], [place] [address]X[place], [place]  [phone]  [facility] (Outpatient, Inpatient, [facility] (Groups and Residental) (insurance only) [phone] [place], [place] [phone]   [facility] (for caregivers and family) [address] Laurell Josephs [address] [place], [place] [phone]  Residential Treatment Programs  [facility](2 years) Residential: 65 days)[facility] [address] [facility] [address] [place], [place], [place] [phone] or [phone]  [facility] Treatment [facility] [address] [place], [place], [place] [phone]  [facility] (RTS) [address] [place], [place], [place] [phone] Admissions: 8am-3pm M-F  [facility] Program: Residential Program [phone] Days)             AD[facility]: [facility]  [place], [place], [place]  [phone] or [phone] in Hours over the weekend or by referral)   Mobil Crisis: [facility]:[phone] (for crisis response 24 hours a day)   Rent/Utility/Housing  Agency Name: [facility] Address: [address] [place], [place] [address] Phone: [phone] Email:  [email or web address] Website: www.[email or web address].org Service(s) Offered: Housing services, self-sufficiency, congregate meal  program, weatherization program, Field seismologist program, emergency food assistance,  housing counseling, home ownership program, wheels -towork program.  Agency Name: [facility] Address: [address], [place], [place] [address] Phone: [phone] (8a-4p) [phone] (8p- 10p) Email: [email or web address] Website: [email or web address].org Service(s) Offered: A program for homeless and/or needy men that includes one-on-one counseling, life skills training and job rehabilitation.  Agency Name: [employer] [facility] [place] Address: [address] [place], [place] [address] Phone: [phone] Website: www.alliedchurches.org Service(s) Offered: Assistance to needy in emergency with utility bills, heating  fuel, and prescriptions. Shelter for homeless 7pm-7am. May 26, 2016 15  Agency Name: [facility] [place] (Developmentally Disabled) Address: [address], [place], [place] [address] Phone: [phone] Contact Person: Cathleen Corti Email: [email or web address] Website: [email or web address] Service(s) Offered: Helps individuals with developmental disabilities move  from housing that is more restrictive to homes where they  can achieve greater independence and have more  opportunities.  Agency Name: [facility] Address: [address], [place], [place] [address] Phone: [phone] Email: [email or web address] Website: www.[email or web address].org Service(s) Offered: Provides affordable housing for low-income families,  elderly, and disabled individuals. Offer a wide range of  programs and services, from financial planning to afterschool and summer programs.  Agency Name: Department of Social Services Address: [address] [place], [place] [address] Phone:  [phone] Service(s) Offered: Child support services; child welfare services; food stamps;  Medicaid; work first family assistance; and aid with fuel,  rent, food and medicine.  Agency Name: [facility] [place] [employer]. Address: [facility] [address], [place], [place]  [address] Phone: [phone] Website: www.familyabuseservices.org Service(s) Offered: 24 hour Crisis Line: [phone]; 24 hour Emergency Shelter;  Transitional Housing; Support Groups; Scientist, physiological;  [employer]; Hispanic Outreach: [phone];  Visitation Center: [phone]. May 26, 2016 16  Agency Name: [facility] [place]. Address: [address] [place], [place] [address] Phone:  [phone] Service(s) Offered: CAP Services; Home and [employer]; Individual  or Group Supports; Respite Care Non-Institutional Nursing;  Residential Supports; Respite Care and Personal Care  Services; Transportation; Family and Friends Night;  Recreational Activities; Three Nutritious Meals/Snacks;  Consultation with Registered Dietician; Twenty-four hour  Registered Nurse Access; Daily and [employer]; Camp [place]aves; [place] for the [employer] (During Summer Months) Bingo Night (Every  Wednesday Night); Special Populations Dance Night  (Every Tuesday Night); Professional Hair Care Services.  Agency Name: [facility] Address: [address], [place], [place] [address] Phone: [phone] Contact Person: Jabier Mutton Website: [email or web address].Physicist, medical) Offered: Residential treatment facility for women; food and  clothing, educational & employment development and  transportation to work; Counsellor of financial skills;  parenting and family reunification; emotional and spiritual  support; transitional housing for program graduates.  Agency Name: [facility] Address: [address] [place], [place] [address] Phone: [phone] Email: [email or web address] Website: TaskTown.es Service(s) Offered: Public housing units for elderly, disabled, and low income  people; housing choice vouchers for income eligible  applicants; shelter plus care vouchers; and [employer] program. May 26, 2016 17  Agency Name: [facility] Address: [address], [place], [place] [address] Phone: [phone] Email: [email or web address] Website: www.habitatalamance.org Service(s) Offered: Build houses for families in need of decent housing. Each  adult in the family must invest 200 hours of labor on  someone else's house, work with volunteers to build their  own house, attend classes on budgeting, home maintenance, yard care, and attend homeowner association  meetings.  Agency Name: [facility] Address: [address] [place], [place] [address] Phone: [phone] Website: www.rsli.org Service(s) Offered: Intermediate care facilities for mentally retarded,  Supervised Living in group homes for adults with  developmental disabilities, Supervised Living for people  who have dual diagnoses (MRMI), Independent Living,  Supported Living, respite and a variety of CAP services,  pre-vocational services, day supports, and [employer].  Agency Name: [place] [employer] Phone: [phone] Website: www.NCForeclosurePrevention.gov Service(s) Offered: Zero-interest, deferred loans to homeowners struggling to  pay their mortgage. Call for more information Agency Name: [facility] Address: [address], [place], [place] [address] Phone: [phone] Website: www.alamanceservices.org Service(s) Offered: Housing services, self-sufficiency, congregate meal  program, and individual development account program.  Agency Name: [facility] [place] Address: [address] [place], [place] [address] Phone: [phone] Email: [email or web address]@alliedchurches [email or web address] Website: www.alliedchurches.org Service(s) Offered: Housing the homeless, feeding the hungry, Financial controller, job and education related services.  Agency Name: [facility] Address: [address], [place], [place] [address] Phone: [phone] Email: [email or web address] Service(s) Offered: Counseling, problem pregnancy, advocacy for Hispanics,  limited emergency financial assistance.  Agency Name: Department of Social Services Address: [address] [place], [place] [address] Phone: [phone] Website: [email or web address].[place].[email or web address]/[email or web address] Service(s) Offered: Child support services; child welfare services; SNAPS;  Medicaid; work first family assistance; and aid with fuel,  rent, food and medicine.  Agency Name: Holiday representative Address: [address], [place], [place] [address] Phone: [phone] or [phone] Email: [email or web address] Service(s) Offered: Family services and transient assistance; emergency food,  fuel, clothing, limited furniture, utilities; budget counseling,  general counseling; give a kid a coat; thrift store; Christmas  food and toys. Utility assistance, food pantry, rental  assistance, life sustaining medicine Food Resources  Agency Name: [facility] Agency Address: [address],  [place], [place] [address] Phone: [phone] Website: www.alamanceservices.org  Service(s) Offered: Housing services, self-sufficiency, congregate meal  program, weatherization program, Field seismologist program, emergency food assistance,  housing counseling, home ownership program, wheels -towork program. Meals free for 60 and older at various  locations from 9am-1pm, Monday-Friday:  [employer], [address] [place], [phone] [facility], [address] [place]  [phone]   [facility], [address]   [place] [phone] The [address], [address],   [place], [phone]  Agency Name: [facility] Address: [phone] [address], [place], [place] [address] Phone: [phone] Website: www.alamancemow.org Service(s) Offered: Home delivered hot, frozen, and emergency  meals. Grocery assistance program which matches  volunteers one-on-one with seniors unable to grocery shop  for themselves. Must be 60 years and older; less than 20  hours of in-home aide service, limited or no driving ability;  live alone or with someone with a disability; live in  [place].  Agency Name: [facility]) Address: [address], [place], [place] [address] Phone: [phone] Service(s) Offered: Food is served to shut-ins, homeless, elderly, and low  income people in the community every Saturday (11:30  am-12:30 pm) and Sunday (12:30 pm-1:30pm). Volunteers  also offer help and encouragement in seeking employment,  and spiritual guidance. May 26, 2016 8  Agency Name: Department of Social Services Address: [address] [place], [place] [address] Phone: [phone] Service(s) Offered: Child support services; child welfare services; food stamps;  Medicaid; work first family assistance; and aid with fuel,  rent, food and medicine.  Agency Name: [facility]itian Address: [address] [place], [place] Phone: [phone] Website: www.dreamalign.com Services Offered: Monday 10:00am-12:00, 8:00pm-9:00pm, and Friday  10:00am-12:00. Agency Name: [facility] [place] Address: [address], [place], [place] [address] Phone: [phone] Website: www.alliedchurches.org Service(s) Offered: Serves weekday meals, open from 11:30 am- 1:00 pm., and  6:30-7:30pm, Monday-Wednesday-Friday distributes food  3:30-6pm,  Monday-Wednesday-Friday.  Agency Name: [facility] Address: [address] [place], [place] Phone: [phone] Website: www.gethsemanechristianchurch.org Services Offered: Distributes food the 4th Saturday of the month, starting at  8:00 am Agency Name: [facility] Address: [phone] [address], [place], [place] [address] Phone: [phone] Website: [email or web address]://hbc.[email or web address] Service(s) Offered: Bread of life, weekly food pantry. Open Wednesdays from  10:00am-noon.  Agency Name: [facility] [employer] [facility] Address: [address] [place], [place], [place] Phone: [phone] Services Offered: Distributes food 9am-1pm, Monday-Thursday. Call for details.   Agency Name: [facility] Address: [address] [place], [place] [address] Phone: [phone] Website: firstbaptistburlington.com Service(s) Offered: Games developer. Call for assistance. Agency Name: [facility] Address: [address], [place], [place] [address] Phone: [phone] Service Offered: Emergency Food Pantry. Call for appointment.  Agency Name: [facility] Address: [address], [place], [place] [address] Phone: [phone] Website: [email or web address].com Services Offered: Games developer. Call for details Agency Name: [facility] Address: [address] [place], [place] Phone: [phone] Website: [email or web address] Service(s) Offered: Emergency Food Pantry. Call for details.  Agency Name: Holiday representative Address: [address], [place], [place] [address] Phone: [phone] or [phone] Website: [email or web address].salvationarmy.[email or web address] Service(s) Offered: Distribute food 9am-11:30 am, Tuesday-Friday, and 1- 3:30pm, Monday-Friday. Food pantry Monday-Friday  1pm-3pm, fresh items, Mon.-Wed.-Fri.  Agency Name: [facility] Address: [address] [place], [place] [address] Phone:  [phone] Website: www.safealamance.org Services Offered: Distribute  food Tues and Sats from 9:00am-noon. Closed  1st Saturday of each month. Call for details

## 2022-06-23 NOTE — TOC Initial Note (Addendum)
Transition of Care (TOC) - Initial/Assessment Note    Patient Details  Name: Christopher Bryan. MRN: [id number] Date of Birth: Sep 26, 1960  Transition of Care [facility]) CM/SW Contact:    Chapman Fitch, RN Phone Number: 06/23/2022, 4:09 PM  Clinical Narrative:                  Admitted for:s/p percutaneous revascularization  Admitted from: patient has been living on the street [facility] health/prior home health/DME: [facility]  Patient was previously living in his truck but was in jail from December - May 10th.   From May 10th till admission patient was living on the street  Patient states that he is currently on probation and can not leave [place] Patient states he is not sure where he will go at discharge Provided patient with resources to [employer].  Patient states that he will consider  Added utility, housing, food, transportation, substance abuse resources to AVS. Discussed with patient   Patient agreeable to RW          Patient Goals and CMS Choice            Expected Discharge Plan and Services                                              Prior Living Arrangements/Services                       Activities of Daily Living Home Assistive Devices/Equipment: None ADL Screening (condition at time of admission) Patient's cognitive ability adequate to safely complete daily activities?: Yes Is the patient deaf or have difficulty hearing?: No Does the patient have difficulty seeing, even when wearing glasses/contacts?: No Does the patient have difficulty concentrating, remembering, or making decisions?: No Patient able to express need for assistance with ADLs?: Yes Does the patient have difficulty dressing or bathing?: No Independently performs ADLs?: Yes (appropriate for developmental age) Does the patient have difficulty walking or climbing stairs?: No Weakness of Legs: Left Weakness of  Arms/Hands: None  Permission Sought/Granted                  Emotional Assessment              Admission diagnosis:  Limb ischemia [I99.8] Ischemia of left lower extremity [I99.8] Acute deep vein thrombosis (DVT) of left lower extremity, unspecified vein (HCC) [I82.402] Patient Active Problem List   Diagnosis Date Noted   Limb ischemia 06/20/2022   Hepatitis C virus infection 09/08/2021   Hyperkalemia 09/08/2021   Critical limb ischemia of left lower extremity (HCC) 09/07/2021   Critical ischemia of foot (HCC) 09/07/2021   AKI (acute kidney injury) (HCC) 09/07/2021   Protein-calorie malnutrition, severe 01/22/2020   Nontraumatic ischemic infarction of muscle, left lower leg 01/20/2020   Dehydration 01/20/2020   Arterial occlusion, lower extremity (HCC) 01/20/2020   Hypertension    Nicotine dependence    COPD (chronic obstructive pulmonary disease) with emphysema (HCC)    PAD (peripheral artery disease) (HCC)    Acute hypoxemic respiratory failure (HCC) 01/08/2018   Chronic diastolic CHF (congestive heart failure) (HCC) 06/21/2016   Acute right-sided heart failure (HCC) 06/21/2016   PCP:  [id number] [address], [place] - [address]  [place] [facility] [place] [address] Phone: [phone] Fax: [phone]     Social Determinants of Health (SDOH) Social History: SDOH Screenings   Food Insecurity: Food Insecurity Present (06/20/2022)  Housing: High Risk (06/20/2022)  Transportation Needs: Unmet Transportation Needs (06/20/2022)  Tobacco Use: High Risk (06/21/2022)   SDOH Interventions:     Readmission Risk Interventions     No data to display

## 2022-06-23 NOTE — Progress Notes (Signed)
[facility]  Daily Progress Note   Subjective  -   Patient continues to complain of pain but seems slightly more comfortable upon presentation to the room today.  I was contacted by the bedside RN earlier today due to the patient continuing to have 10/10 pain following pain medication administration.  Initially pulses were not palpable and they were also not dopplerable, increasing concern.  Objective Vitals:   06/22/22 1606 06/22/22 2048 06/23/22 0502 06/23/22 0928  BP: (!) 126/57 (!) 137/59 (!) 147/68 (!) 165/62  Pulse: 85 82 77 96  Resp: 16 19 18 16   Temp: 99 F (37.2 C) 99.2 F (37.3 C) 99.3 F (37.4 C) 99.6 F (37.6 C)  TempSrc:  Oral    SpO2: 95% 93% 96% 100%  Weight:      Height:        Intake/Output Summary (Last 24 hours) at 06/23/2022 1538 Last data filed at 06/23/2022 1042 Gross per 24 hour  Intake 480 ml  Output --  Net 480 ml    PULM  CTAB CV  RRR VASC  soft groin with warm left foot, dopplerable DP/PT pulses  Laboratory CBC    Component Value Date/Time   WBC 5.1 06/20/2022 0106   HGB 12.0 (L) 06/20/2022 0106   HCT 37.2 (L) 06/20/2022 0106   PLT 329 06/20/2022 0106    BMET    Component Value Date/Time   NA 134 (L) 06/23/2022 0431   K 4.2 06/23/2022 0431   CL 102 06/23/2022 0431   CO2 25 06/23/2022 0431   GLUCOSE 107 (H) 06/23/2022 0431   BUN 27 (H) 06/23/2022 0431   CREATININE 0.98 06/23/2022 0431   CALCIUM 8.7 (L) 06/23/2022 0431   GFRNONAA >60 06/23/2022 0431   GFRAA >60 01/11/2018 0356    Assessment/Planning: POD #3 s/p percutaneous revascularization of the left lower extremity with successful recanalization of the right to left femoral to femoral bypass for rest pain with neurologic changes   The leg continues to remain warm.  Initially there was concern for ischemia today due to not dopplerable pulses however this was found to be incorrect due to faulty equipment.  DP/PT pulses were dopplerable.  Foot was warm,  able to move toes He continues to neuropathic pain descriptions of pain, likely related to nerve damage due to prolonged ischemia. Patient is a continued to walk with PT Patient has no nonpalpable pulses due to chronic left SFA occlusion, but has strong profunda collaterals. Patient will discharge tomorrow  Georgiana Spinner  06/23/2022, 3:38 PM

## 2022-06-23 NOTE — TOC Progression Note (Addendum)
Transition of Care (TOC) - Progression Note    Patient Details  Name: Christopher Bryan. MRN: [id number] Date of Birth: 24-Oct-1960  Transition of Care [facility]) CM/SW Contact  Chapman Fitch, RN Phone Number: 06/23/2022, 4:20 PM  Clinical Narrative:     Met with patient at bedside.  Patient states that he will be staying with his sister Christopher Bryan at discharge in [facility] [phone].  He states that Christopher will pick him up at discharge. He request for medications to be sent to [facility] [address].  Pharmacy updated.  [facility]  Confirmed patient has VA, UHC, and medicaid.  RW ordered through [place] with adapt to be delivered to room  Confirmed with bedside RN that patient ambulated on RA and maintained O2 sats  Provided patient with resources to the [place] [facility] in [place]  Patient not interested in outpatient PT at this time        Expected Discharge Plan and Services                                               Social Determinants of Health (SDOH) Interventions SDOH Screenings   Food Insecurity: Food Insecurity Present (06/20/2022)  Housing: High Risk (06/20/2022)  Transportation Needs: Unmet Transportation Needs (06/20/2022)  Tobacco Use: High Risk (06/21/2022)    Readmission Risk Interventions     No data to display

## 2022-06-24 ENCOUNTER — Inpatient Hospital Stay: Payer: No Typology Code available for payment source

## 2022-06-24 ENCOUNTER — Telehealth (INDEPENDENT_AMBULATORY_CARE_PROVIDER_SITE_OTHER): Payer: Self-pay

## 2022-06-24 LAB — CBC
HCT: 31.4 % — ABNORMAL LOW (ref 39.0–52.0)
Hemoglobin: 9.8 g/dL — ABNORMAL LOW (ref 13.0–17.0)
MCH: 28.5 pg (ref 26.0–34.0)
MCHC: 31.2 g/dL (ref 30.0–36.0)
MCV: 91.3 fL (ref 80.0–100.0)
Platelets: 310 10*3/uL (ref 150–400)
RBC: 3.44 MIL/uL — ABNORMAL LOW (ref 4.22–5.81)
RDW: 13 % (ref 11.5–15.5)
WBC: 4 10*3/uL (ref 4.0–10.5)
nRBC: 0 % (ref 0.0–0.2)

## 2022-06-24 MED ORDER — ASPIRIN 81 MG PO TBEC: 81.0000 mg | DELAYED_RELEASE_TABLET | Freq: Every day | ORAL | 12 refills | Status: DC

## 2022-06-24 MED ORDER — OXYCODONE-ACETAMINOPHEN 5-325 MG PO TABS: 1.0000 | ORAL_TABLET | Freq: Four times a day (QID) | ORAL | 0 refills | Status: DC | PRN

## 2022-06-24 MED ORDER — APIXABAN 5 MG PO TABS: 5.0000 mg | ORAL_TABLET | Freq: Two times a day (BID) | ORAL | 3 refills | Status: AC

## 2022-06-24 NOTE — Telephone Encounter (Signed)
We can change to the 6 in 24 hours

## 2022-06-24 NOTE — Plan of Care (Signed)
Adequate for discharge. Resolve plan of care  Christopher Bryan V Chanteria Haggard   

## 2022-06-24 NOTE — Telephone Encounter (Signed)
Pharmacy made aware of change.  

## 2022-06-24 NOTE — Progress Notes (Signed)
[facility]  Daily Progress Note   Subjective  -   Still some nerve pain.  Complaining of left should pain. No events overnight  Objective Vitals:   06/23/22 1500 06/23/22 2039 06/24/22 0452 06/24/22 0829  BP: 108/62 116/62 (!) 127/59 (!) 152/72  Pulse: 71 78 66 77  Resp: 16 18 18 18   Temp: 98.5 F (36.9 C) 98.5 F (36.9 C) 98.8 F (37.1 C) 98.5 F (36.9 C)  TempSrc: Oral Oral Oral Oral  SpO2: 97% 99% 98% 100%  Weight:      Height:        Intake/Output Summary (Last 24 hours) at 06/24/2022 0836 Last data filed at 06/24/2022 0500 Gross per 24 hour  Intake 480 ml  Output 500 ml  Net -20 ml    PULM  CTAB CV  RRR VASC  Left foot warm, doppler signals present.   Laboratory CBC    Component Value Date/Time   WBC 4.0 06/24/2022 0445   HGB 9.8 (L) 06/24/2022 0445   HCT 31.4 (L) 06/24/2022 0445   PLT 310 06/24/2022 0445    BMET    Component Value Date/Time   NA 134 (L) 06/23/2022 0431   K 4.2 06/23/2022 0431   CL 102 06/23/2022 0431   CO2 25 06/23/2022 0431   GLUCOSE 107 (H) 06/23/2022 0431   BUN 27 (H) 06/23/2022 0431   CREATININE 0.98 06/23/2022 0431   CALCIUM 8.7 (L) 06/23/2022 0431   GFRNONAA >60 06/23/2022 0431   GFRAA >60 01/11/2018 0356    Assessment/Planning: POD #4 s/p LLE revascularization for ischemic leg  Perfusion is intact now.  Likely occluded his bypass when his ASA and Plavix were not given Shoulder pain.  Will order xray Home later today   Festus Barren  06/24/2022, 8:36 AM

## 2022-06-24 NOTE — Discharge Summary (Signed)
[facility]    Discharge Summary    Patient ID:  Christopher Bryan. MRN: [id number] DOB/AGE: Oct 13, 1960 62 y.o.  Admit date: 06/20/2022 Discharge date: 06/24/2022 Date of Surgery: 06/20/2022 Surgeon: Surgeon(s): Dew, Marlow Baars, MD  Admission Diagnosis: Limb ischemia [I99.8] Ischemia of left lower extremity [I99.8] Acute deep vein thrombosis (DVT) of left lower extremity, unspecified vein (HCC) [I82.402]  Discharge Diagnoses:  Limb ischemia [I99.8] Ischemia of left lower extremity [I99.8] Acute deep vein thrombosis (DVT) of left lower extremity, unspecified vein (HCC) [I82.402]  Secondary Diagnoses: Past Medical History:  Diagnosis Date   COPD (chronic obstructive pulmonary disease) (HCC)    DVT (deep venous thrombosis) (HCC)    Hypertension    Peripheral arterial disease (HCC)     Procedure(s): Lower Extremity Angiography  Discharged Condition: good  HPI:  Christopher Bryan is a 62 year old male with a history of A-fib him bypass with multiple left lower extremity interventions.  He presented to [facility] on 06/20/2022 with persistent numbness and loss of motor function.  He had the symptoms for about a week prior to presenting to the hospital.  The patient was incarcerated for short time and during that timeframe he was unable to receive his medications including Plavix and aspirin.  The patient underwent intervention of his left lower extremity with successful revascularization.  Following the revascularization the patient had difficulty with walking.  This has improved but he does require some assistive devices for continued ambulation.  He also had significant numbness and tingling in his foot and toes as well as pain and tenderness in his lower leg.  The patient's foot remained warm during this time and he has had dopplerable pulses.  As discussed with the patient this is related to some likely significant reperfusion injuries  in the setting of his prolonged ischemia in addition to the fact that he was not receiving his other medications such as gabapentin.  He noted some shoulder pain this morning and was found to have moderate to severe arthritis.  We will refer the patient to orthopedic surgery on an outpatient basis.  Will plan to have the patient follow-up in our office in 3 to 4 weeks with noninvasive studies.  Hospital Course:  Christopher Delorbe. is a 62 y.o. male is S/P percutaneous revascularization of the left lower extremity with successful recanalization of the right to left femoral to femoral bypass for rest pain with neurologic changes  Extubated: POD # 0 Physical Exam:  Alert notes x3, no acute distress Face: Symmetrical.  Tongue is midline. Neck: Trachea is midline.  No swelling or bruising. Cardiovascular: Regular rate and rhythm Pulmonary: Clear to auscultation bilaterally Abdomen: Soft, nontender, nondistended Right groin access: Clean dry and intact.  No swelling or drainage noted Left groin access: Clean dry and intact.  No swelling or drainage noted Left lower extremity: Thigh soft.  Calf soft.  Extremities warm distally toes.  Hard to palpate pedal pulses however the foot is warm  Right lower extremity: Thigh soft.  Calf soft.  Extremities warm distally toes.  Hard to palpate pedal pulses however the foot is warm Neurological: No deficits noted   Post-op wounds:  clean, dry, intact or healing well  Pt. Ambulating, voiding and taking PO diet without difficulty.  Will require walker. Pt pain controlled with PO pain meds.  Labs:  As below  Complications: none  Consults:    Significant Diagnostic Studies: CBC Lab Results  Component Value  Date   WBC 4.0 06/24/2022   HGB 9.8 (L) 06/24/2022   HCT 31.4 (L) 06/24/2022   MCV 91.3 06/24/2022   PLT 310 06/24/2022    BMET    Component Value Date/Time   NA 134 (L) 06/23/2022 0431   K 4.2 06/23/2022 0431   CL 102 06/23/2022 0431    CO2 25 06/23/2022 0431   GLUCOSE 107 (H) 06/23/2022 0431   BUN 27 (H) 06/23/2022 0431   CREATININE 0.98 06/23/2022 0431   CALCIUM 8.7 (L) 06/23/2022 0431   GFRNONAA >60 06/23/2022 0431   GFRAA >60 01/11/2018 0356   COAG Lab Results  Component Value Date   INR 1.1 06/20/2022   INR 1.0 09/06/2021   INR 1.2 10/26/2020     Disposition:  Discharge to :Home  Allergies as of 06/24/2022       Reactions   Iodine Rash   Ivp Dye [iodinated Contrast Media] Hives        Medication List     TAKE these medications    acetaminophen 325 MG tablet Commonly known as: TYLENOL Take 650 mg by mouth 3 (three) times daily as needed for mild pain or fever.   albuterol 108 (90 Base) MCG/ACT inhaler Commonly known as: VENTOLIN HFA Inhale 2 puffs into the lungs every 6 (six) hours as needed for wheezing or shortness of breath.   apixaban 5 MG Tabs tablet Commonly known as: ELIQUIS Take 1 tablet (5 mg total) by mouth 2 (two) times daily for 7 days.   atorvastatin 80 MG tablet Commonly known as: LIPITOR Take 80 mg by mouth at bedtime.   carboxymethylcellulose 0.5 % Soln Commonly known as: REFRESH PLUS Place 1 drop into both eyes 4 (four) times daily as needed.   carvedilol 12.5 MG tablet Commonly known as: COREG Take 12.5 mg by mouth 2 (two) times daily with a meal.   cholecalciferol 1000 units tablet Commonly known as: VITAMIN D Take 2,000 Units by mouth daily.   clopidogrel 75 MG tablet Commonly known as: PLAVIX Take 1 tablet (75 mg total) by mouth daily.   diclofenac Sodium 1 % Gel Commonly known as: VOLTAREN Apply 2 g topically 3 (three) times daily.   famotidine 20 MG tablet Commonly known as: PEPCID Take 20 mg by mouth 2 (two) times daily as needed for heartburn or indigestion.   fluticasone-salmeterol 250-50 MCG/ACT Aepb Commonly known as: ADVAIR Inhale 1 puff into the lungs in the morning and at bedtime.   gabapentin 400 MG capsule Commonly known as:  NEURONTIN Take 1 capsule (400 mg total) by mouth 3 (three) times daily for 20 days. What changed: how much to take   hydrOXYzine 25 MG tablet Commonly known as: ATARAX Take 25 mg by mouth 3 (three) times daily as needed (sleep).   lidocaine 5 % Commonly known as: LIDODERM Place 1 patch onto the skin daily. Remove & Discard patch within 12 hours or as directed by MD   lisinopril 20 MG tablet Commonly known as: ZESTRIL Take 20 mg by mouth daily.   loratadine 10 MG tablet Commonly known as: CLARITIN Take 10 mg by mouth daily as needed for allergies.   mirtazapine 7.5 MG tablet Commonly known as: REMERON Take 7.5 mg by mouth at bedtime.   oxyCODONE-acetaminophen 5-325 MG tablet Commonly known as: PERCOCET/ROXICET Take 1-2 tablets by mouth every 6 (six) hours as needed for moderate pain or severe pain.   pantoprazole 40 MG tablet Commonly known as: PROTONIX Take 40 mg by  mouth daily.   sennosides-docusate sodium 8.6-50 MG tablet Commonly known as: SENOKOT-S Take 1-2 tablets by mouth at bedtime as needed for constipation.   sildenafil 100 MG tablet Commonly known as: VIAGRA Take 100 mg by mouth daily as needed for erectile dysfunction.   tamsulosin 0.4 MG Caps capsule Commonly known as: FLOMAX Take 0.4 mg by mouth daily after breakfast.   thiamine 100 MG tablet Commonly known as: VITAMIN B1 Take 100 mg by mouth daily.   tiotropium 18 MCG inhalation capsule Commonly known as: SPIRIVA Place 18 mcg into inhaler and inhale daily.               Durable Medical Equipment  (From admission, onward)           Start     Ordered   06/23/22 1603  For home use only DME Walker rolling  Once       Question Answer Comment  Walker: With 5 Inch Wheels   Patient needs a walker to treat with the following condition Weakness      06/23/22 1602           Verbal and written Discharge instructions given to the patient. Wound care per Discharge AVS  Follow-up  Information     Dew, Marlow Baars, MD Follow up.   Specialties: Vascular Surgery, Radiology, Interventional Cardiology Why: See JD/FB in 3-4 weeks with ABIs Contact information: [address] [place] [address] [phone]         Orthopedic Provider Follow up.   Why: We will send outpatient referral for shoulder arthritis. They will contact you.                Signed: Georgiana Spinner, NP  06/24/2022, 12:19 PM

## 2022-07-25 ENCOUNTER — Telehealth (INDEPENDENT_AMBULATORY_CARE_PROVIDER_SITE_OTHER): Payer: Self-pay

## 2022-07-25 NOTE — Telephone Encounter (Signed)
Patient called and left 2 voice mails upset stating no one would call him back to make a follow up appt after he had surgery on 06/24/22 when he was discharged. On the 1st voicemail he left no name or call back information and the number on the called Id matched a woman's chart. The 2nd call, he left his name and a call back number. I was able call him back and pull up his chart. The patient already had a follow up appt for 07/29/22 @ 2:45 pm  and 3:45 pm. Made on 07/06/22.

## 2022-07-29 ENCOUNTER — Ambulatory Visit (INDEPENDENT_AMBULATORY_CARE_PROVIDER_SITE_OTHER): Payer: Medicare Other | Admitting: Nurse Practitioner

## 2022-07-29 ENCOUNTER — Encounter (INDEPENDENT_AMBULATORY_CARE_PROVIDER_SITE_OTHER): Payer: Medicare Other

## 2022-11-05 ENCOUNTER — Emergency Department
Admission: EM | Admit: 2022-11-05 | Discharge: 2022-11-05 | Disposition: A | Discharge status: Home / Self Care | Payer: No Typology Code available for payment source | Attending: Emergency Medicine | Admitting: Emergency Medicine

## 2022-11-05 ENCOUNTER — Emergency Department: Payer: No Typology Code available for payment source

## 2022-11-05 ENCOUNTER — Other Ambulatory Visit: Payer: Self-pay

## 2022-11-05 DIAGNOSIS — M25512 Pain in left shoulder: Secondary | ICD-10-CM | POA: Insufficient documentation

## 2022-11-05 DIAGNOSIS — S80812A Abrasion, left lower leg, initial encounter: Secondary | ICD-10-CM | POA: Insufficient documentation

## 2022-11-05 DIAGNOSIS — S2231XA Fracture of one rib, right side, initial encounter for closed fracture: Secondary | ICD-10-CM | POA: Insufficient documentation

## 2022-11-05 DIAGNOSIS — R519 Headache, unspecified: Secondary | ICD-10-CM | POA: Insufficient documentation

## 2022-11-05 DIAGNOSIS — S80811A Abrasion, right lower leg, initial encounter: Secondary | ICD-10-CM | POA: Insufficient documentation

## 2022-11-05 DIAGNOSIS — S299XXA Unspecified injury of thorax, initial encounter: Secondary | ICD-10-CM | POA: Diagnosis present

## 2022-11-05 DIAGNOSIS — Y92410 Unspecified street and highway as the place of occurrence of the external cause: Secondary | ICD-10-CM | POA: Diagnosis not present

## 2022-11-05 DIAGNOSIS — M549 Dorsalgia, unspecified: Secondary | ICD-10-CM | POA: Insufficient documentation

## 2022-11-05 DIAGNOSIS — S60512A Abrasion of left hand, initial encounter: Secondary | ICD-10-CM | POA: Diagnosis not present

## 2022-11-05 DIAGNOSIS — M25511 Pain in right shoulder: Secondary | ICD-10-CM | POA: Diagnosis not present

## 2022-11-05 DIAGNOSIS — M542 Cervicalgia: Secondary | ICD-10-CM | POA: Insufficient documentation

## 2022-11-05 DIAGNOSIS — S60511A Abrasion of right hand, initial encounter: Secondary | ICD-10-CM | POA: Insufficient documentation

## 2022-11-05 LAB — BASIC METABOLIC PANEL
Anion gap: 8 (ref 5–15)
BUN: 18 mg/dL (ref 8–23)
CO2: 23 mmol/L (ref 22–32)
Calcium: 8.8 mg/dL — ABNORMAL LOW (ref 8.9–10.3)
Chloride: 106 mmol/L (ref 98–111)
Creatinine, Ser: 1.19 mg/dL (ref 0.61–1.24)
GFR, Estimated: >60 mL/min (ref >60)
Glucose, Bld: 89 mg/dL (ref 70–99)
Potassium: 4.6 mmol/L (ref 3.5–5.1)
Sodium: 137 mmol/L (ref 135–145)

## 2022-11-05 LAB — CBC
HCT: 38.6 % — ABNORMAL LOW (ref 39.0–52.0)
Hemoglobin: 12.7 g/dL — ABNORMAL LOW (ref 13.0–17.0)
MCH: 27.9 pg (ref 26.0–34.0)
MCHC: 32.9 g/dL (ref 30.0–36.0)
MCV: 84.6 fL (ref 80.0–100.0)
Platelets: 179 10*3/uL (ref 150–400)
RBC: 4.56 MIL/uL (ref 4.22–5.81)
RDW: 14.2 % (ref 11.5–15.5)
WBC: 4.7 10*3/uL (ref 4.0–10.5)
nRBC: 0 % (ref 0.0–0.2)

## 2022-11-05 LAB — TROPONIN I (HIGH SENSITIVITY): Troponin I (High Sensitivity): 9 ng/L (ref <18)

## 2022-11-05 MED ORDER — OXYCODONE-ACETAMINOPHEN 5-325 MG PO TABS
1.0000 | ORAL_TABLET | Freq: Once | ORAL | Status: AC
  Administered 2022-11-05: 1 via ORAL
  Filled 2022-11-05: qty 1

## 2022-11-05 MED ORDER — LIDOCAINE 5 % EX PTCH: 1.0000 | MEDICATED_PATCH | Freq: Two times a day (BID) | CUTANEOUS | 0 refills | Status: DC

## 2022-11-05 MED ORDER — LIDOCAINE 5 % EX PTCH: 1.0000 | MEDICATED_PATCH | Freq: Two times a day (BID) | CUTANEOUS | 0 refills | Status: AC

## 2022-11-05 MED ORDER — METHOCARBAMOL 500 MG PO TABS: 500.0000 mg | ORAL_TABLET | Freq: Three times a day (TID) | ORAL | 0 refills | Status: DC | PRN
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
## 2023-01-26 ENCOUNTER — Inpatient Hospital Stay
Admission: EM | Admit: 2023-01-26 | Discharge: 2023-01-31 | DRG: 871 | Disposition: A | Discharge status: Home Health | Payer: 59 | Attending: Internal Medicine | Admitting: Internal Medicine

## 2023-01-26 ENCOUNTER — Inpatient Hospital Stay: Payer: 59

## 2023-01-26 ENCOUNTER — Other Ambulatory Visit: Payer: Self-pay

## 2023-01-26 ENCOUNTER — Inpatient Hospital Stay (HOSPITAL_COMMUNITY)
Admit: 2023-01-26 | Discharge: 2023-01-26 | Disposition: A | Discharge status: Home / Self Care | Payer: 59 | Attending: Cardiovascular Disease | Admitting: Cardiovascular Disease

## 2023-01-26 ENCOUNTER — Emergency Department: Payer: 59

## 2023-01-26 DIAGNOSIS — I700 Atherosclerosis of aorta: Secondary | ICD-10-CM | POA: Diagnosis present

## 2023-01-26 DIAGNOSIS — G9341 Metabolic encephalopathy: Secondary | ICD-10-CM | POA: Diagnosis present

## 2023-01-26 DIAGNOSIS — R570 Cardiogenic shock: Secondary | ICD-10-CM | POA: Diagnosis present

## 2023-01-26 DIAGNOSIS — J189 Pneumonia, unspecified organism: Secondary | ICD-10-CM | POA: Diagnosis present

## 2023-01-26 DIAGNOSIS — J9601 Acute respiratory failure with hypoxia: Principal | ICD-10-CM

## 2023-01-26 DIAGNOSIS — J9621 Acute and chronic respiratory failure with hypoxia: Secondary | ICD-10-CM | POA: Diagnosis present

## 2023-01-26 DIAGNOSIS — J449 Chronic obstructive pulmonary disease, unspecified: Secondary | ICD-10-CM

## 2023-01-26 DIAGNOSIS — D649 Anemia, unspecified: Secondary | ICD-10-CM | POA: Diagnosis present

## 2023-01-26 DIAGNOSIS — I110 Hypertensive heart disease with heart failure: Secondary | ICD-10-CM | POA: Diagnosis present

## 2023-01-26 DIAGNOSIS — I2489 Other forms of acute ischemic heart disease: Secondary | ICD-10-CM

## 2023-01-26 DIAGNOSIS — A419 Sepsis, unspecified organism: Secondary | ICD-10-CM | POA: Diagnosis present

## 2023-01-26 DIAGNOSIS — I214 Non-ST elevation (NSTEMI) myocardial infarction: Secondary | ICD-10-CM | POA: Diagnosis present

## 2023-01-26 DIAGNOSIS — R54 Age-related physical debility: Secondary | ICD-10-CM | POA: Diagnosis present

## 2023-01-26 DIAGNOSIS — Z91041 Radiographic dye allergy status: Secondary | ICD-10-CM

## 2023-01-26 DIAGNOSIS — F17200 Nicotine dependence, unspecified, uncomplicated: Secondary | ICD-10-CM | POA: Diagnosis present

## 2023-01-26 DIAGNOSIS — F1410 Cocaine abuse, uncomplicated: Secondary | ICD-10-CM

## 2023-01-26 DIAGNOSIS — Z681 Body mass index (BMI) 19 or less, adult: Secondary | ICD-10-CM

## 2023-01-26 DIAGNOSIS — N170 Acute kidney failure with tubular necrosis: Secondary | ICD-10-CM | POA: Diagnosis present

## 2023-01-26 DIAGNOSIS — F1010 Alcohol abuse, uncomplicated: Secondary | ICD-10-CM | POA: Diagnosis present

## 2023-01-26 DIAGNOSIS — E785 Hyperlipidemia, unspecified: Secondary | ICD-10-CM | POA: Diagnosis present

## 2023-01-26 DIAGNOSIS — R0602 Shortness of breath: Secondary | ICD-10-CM | POA: Diagnosis present

## 2023-01-26 DIAGNOSIS — J441 Chronic obstructive pulmonary disease with (acute) exacerbation: Secondary | ICD-10-CM | POA: Diagnosis present

## 2023-01-26 DIAGNOSIS — J440 Chronic obstructive pulmonary disease with acute lower respiratory infection: Secondary | ICD-10-CM | POA: Diagnosis present

## 2023-01-26 DIAGNOSIS — I1A0 Resistant hypertension: Secondary | ICD-10-CM | POA: Diagnosis not present

## 2023-01-26 DIAGNOSIS — J439 Emphysema, unspecified: Secondary | ICD-10-CM | POA: Diagnosis present

## 2023-01-26 DIAGNOSIS — I252 Old myocardial infarction: Secondary | ICD-10-CM | POA: Diagnosis not present

## 2023-01-26 DIAGNOSIS — J9622 Acute and chronic respiratory failure with hypercapnia: Secondary | ICD-10-CM | POA: Diagnosis present

## 2023-01-26 DIAGNOSIS — J9602 Acute respiratory failure with hypercapnia: Secondary | ICD-10-CM

## 2023-01-26 DIAGNOSIS — Z86711 Personal history of pulmonary embolism: Secondary | ICD-10-CM

## 2023-01-26 DIAGNOSIS — I469 Cardiac arrest, cause unspecified: Secondary | ICD-10-CM

## 2023-01-26 DIAGNOSIS — N179 Acute kidney failure, unspecified: Secondary | ICD-10-CM | POA: Diagnosis present

## 2023-01-26 DIAGNOSIS — Z1152 Encounter for screening for COVID-19: Secondary | ICD-10-CM | POA: Diagnosis not present

## 2023-01-26 DIAGNOSIS — E43 Unspecified severe protein-calorie malnutrition: Secondary | ICD-10-CM | POA: Diagnosis present

## 2023-01-26 DIAGNOSIS — E872 Acidosis, unspecified: Secondary | ICD-10-CM | POA: Diagnosis present

## 2023-01-26 DIAGNOSIS — I428 Other cardiomyopathies: Secondary | ICD-10-CM | POA: Diagnosis present

## 2023-01-26 DIAGNOSIS — I5021 Acute systolic (congestive) heart failure: Secondary | ICD-10-CM | POA: Diagnosis present

## 2023-01-26 DIAGNOSIS — Z716 Tobacco abuse counseling: Secondary | ICD-10-CM

## 2023-01-26 DIAGNOSIS — B970 Adenovirus as the cause of diseases classified elsewhere: Secondary | ICD-10-CM | POA: Diagnosis present

## 2023-01-26 DIAGNOSIS — I2510 Atherosclerotic heart disease of native coronary artery without angina pectoris: Secondary | ICD-10-CM | POA: Diagnosis present

## 2023-01-26 DIAGNOSIS — I255 Ischemic cardiomyopathy: Secondary | ICD-10-CM | POA: Diagnosis present

## 2023-01-26 LAB — CBC WITH DIFFERENTIAL/PLATELET
Abs Immature Granulocytes: 0.03 10*3/uL (ref 0.00–0.07)
Basophils Absolute: 0 10*3/uL (ref 0.0–0.1)
Basophils Relative: 0 %
Eosinophils Absolute: 0.1 10*3/uL (ref 0.0–0.5)
Eosinophils Relative: 1 %
HCT: 46.4 % (ref 39.0–52.0)
Hemoglobin: 14.4 g/dL (ref 13.0–17.0)
Immature Granulocytes: 0 %
Lymphocytes Relative: 42 %
Lymphs Abs: 3.9 10*3/uL (ref 0.7–4.0)
MCH: 28.5 pg (ref 26.0–34.0)
MCHC: 31 g/dL (ref 30.0–36.0)
MCV: 91.9 fL (ref 80.0–100.0)
Monocytes Absolute: 0.4 10*3/uL (ref 0.1–1.0)
Monocytes Relative: 5 %
Neutro Abs: 4.9 10*3/uL (ref 1.7–7.7)
Neutrophils Relative %: 52 %
Platelets: 366 10*3/uL (ref 150–400)
RBC: 5.05 MIL/uL (ref 4.22–5.81)
RDW: 12.8 % (ref 11.5–15.5)
WBC: 9.3 10*3/uL (ref 4.0–10.5)
nRBC: 0 % (ref 0.0–0.2)

## 2023-01-26 LAB — COMPREHENSIVE METABOLIC PANEL
ALT: 12 U/L (ref 0–44)
AST: 23 U/L (ref 15–41)
Albumin: 4.1 g/dL (ref 3.5–5.0)
Alkaline Phosphatase: 103 U/L (ref 38–126)
Anion gap: 13 (ref 5–15)
BUN: 9 mg/dL (ref 8–23)
CO2: 24 mmol/L (ref 22–32)
Calcium: 9.2 mg/dL (ref 8.9–10.3)
Chloride: 100 mmol/L (ref 98–111)
Creatinine, Ser: 1.21 mg/dL (ref 0.61–1.24)
GFR, Estimated: >60 mL/min (ref >60)
Glucose, Bld: 175 mg/dL — ABNORMAL HIGH (ref 70–99)
Potassium: 3.7 mmol/L (ref 3.5–5.1)
Sodium: 137 mmol/L (ref 135–145)
Total Bilirubin: 0.4 mg/dL (ref <1.2)
Total Protein: 8.6 g/dL — ABNORMAL HIGH (ref 6.5–8.1)

## 2023-01-26 LAB — CK: Total CK: 151 U/L (ref 49–397)

## 2023-01-26 LAB — URINALYSIS, ROUTINE W REFLEX MICROSCOPIC
Bacteria, UA: NONE SEEN
Bilirubin Urine: NEGATIVE
Glucose, UA: NEGATIVE mg/dL
Hgb urine dipstick: NEGATIVE
Ketones, ur: NEGATIVE mg/dL
Leukocytes,Ua: NEGATIVE
Nitrite: NEGATIVE
Protein, ur: 100 mg/dL — AB
Specific Gravity, Urine: 1.014 (ref 1.005–1.030)
pH: 5 (ref 5.0–8.0)

## 2023-01-26 LAB — CBC
HCT: 41.4 % (ref 39.0–52.0)
Hemoglobin: 12.9 g/dL — ABNORMAL LOW (ref 13.0–17.0)
MCH: 28.5 pg (ref 26.0–34.0)
MCHC: 31.2 g/dL (ref 30.0–36.0)
MCV: 91.4 fL (ref 80.0–100.0)
Platelets: 311 K/uL (ref 150–400)
RBC: 4.53 MIL/uL (ref 4.22–5.81)
RDW: 12.7 % (ref 11.5–15.5)
WBC: 14.6 K/uL — ABNORMAL HIGH (ref 4.0–10.5)
nRBC: 0 % (ref 0.0–0.2)

## 2023-01-26 LAB — BLOOD GAS, ARTERIAL
Acid-Base Excess: 2.4 mmol/L — ABNORMAL HIGH (ref 0.0–2.0)
Acid-Base Excess: 2.8 mmol/L — ABNORMAL HIGH (ref 0.0–2.0)
Acid-Base Excess: 20.3 mmol/L — ABNORMAL HIGH (ref 0.0–2.0)
Acid-Base Excess: 3.1 mmol/L — ABNORMAL HIGH (ref 0.0–2.0)
Acid-Base Excess: 4.4 mmol/L — ABNORMAL HIGH (ref 0.0–2.0)
Bicarbonate: 29.2 mmol/L — ABNORMAL HIGH (ref 20.0–28.0)
Bicarbonate: 30.2 mmol/L — ABNORMAL HIGH (ref 20.0–28.0)
Bicarbonate: 31 mmol/L — ABNORMAL HIGH (ref 20.0–28.0)
Bicarbonate: 32.1 mmol/L — ABNORMAL HIGH (ref 20.0–28.0)
Bicarbonate: 47.7 mmol/L — ABNORMAL HIGH (ref 20.0–28.0)
FIO2: 100 %
FIO2: 100 %
FIO2: 100 %
FIO2: 40 %
Mechanical Rate: 20
Mechanical Rate: 20
O2 Saturation: 100 %
O2 Saturation: 100 %
O2 Saturation: 100 %
O2 Saturation: 98.6 %
O2 Saturation: 99.7 %
PEEP: 10 cmH2O
PEEP: 5 cmH2O
PEEP: 8 cmH2O
PEEP: 8 cmH2O
PEEP: 8 cmH2O
Patient temperature: 37
Patient temperature: 37
Patient temperature: 37
Patient temperature: 37
Patient temperature: 37
Pressure control: 24 cmH2O
Pressure control: 24 cmH2O
Pressure control: 24 cmH2O
Pressure control: 24 cmH2O
pCO2 arterial: 43 mm[Hg] (ref 32–48)
pCO2 arterial: 60 mm[Hg] — ABNORMAL HIGH (ref 32–48)
pCO2 arterial: 63 mm[Hg] — ABNORMAL HIGH (ref 32–48)
pCO2 arterial: 64 mm[Hg] — ABNORMAL HIGH (ref 32–48)
pCO2 arterial: 70 mm[Hg] (ref 32–48)
pH, Arterial: 7.27 — ABNORMAL LOW (ref 7.35–7.45)
pH, Arterial: 7.3 — ABNORMAL LOW (ref 7.35–7.45)
pH, Arterial: 7.31 — ABNORMAL LOW (ref 7.35–7.45)
pH, Arterial: 7.44 (ref 7.35–7.45)
pH, Arterial: 7.48 — ABNORMAL HIGH (ref 7.35–7.45)
pO2, Arterial: 180 mm[Hg] — ABNORMAL HIGH (ref 83–108)
pO2, Arterial: 259 mm[Hg] — ABNORMAL HIGH (ref 83–108)
pO2, Arterial: 322 mm[Hg] — ABNORMAL HIGH (ref 83–108)
pO2, Arterial: 397 mm[Hg] — ABNORMAL HIGH (ref 83–108)
pO2, Arterial: 93 mm[Hg] (ref 83–108)

## 2023-01-26 LAB — ECHOCARDIOGRAM COMPLETE
AR max vel: 2.45 cm2
AV Area VTI: 2.97 cm2
AV Area mean vel: 2.73 cm2
AV Mean grad: 3 mm[Hg]
AV Peak grad: 5.2 mm[Hg]
Ao pk vel: 1.14 m/s
Area-P 1/2: 5.02 cm2
Calc EF: 49.1 %
Height: 70 in
S' Lateral: 3.5 cm
Single Plane A2C EF: 52.8 %
Single Plane A4C EF: 44.5 %
Weight: 2003.54 [oz_av]

## 2023-01-26 LAB — URINE DRUG SCREEN, QUALITATIVE (ARMC ONLY)
Amphetamines, Ur Screen: NOT DETECTED
Barbiturates, Ur Screen: NOT DETECTED
Benzodiazepine, Ur Scrn: NOT DETECTED
Cannabinoid 50 Ng, Ur ~~LOC~~: NOT DETECTED
Cocaine Metabolite,Ur ~~LOC~~: POSITIVE — AB
MDMA (Ecstasy)Ur Screen: NOT DETECTED
Methadone Scn, Ur: NOT DETECTED
Opiate, Ur Screen: NOT DETECTED
Phencyclidine (PCP) Ur S: NOT DETECTED
Tricyclic, Ur Screen: NOT DETECTED

## 2023-01-26 LAB — BASIC METABOLIC PANEL WITH GFR
Anion gap: 13 (ref 5–15)
BUN: 9 mg/dL (ref 8–23)
CO2: 26 mmol/L (ref 22–32)
Calcium: 11.7 mg/dL — ABNORMAL HIGH (ref 8.9–10.3)
Chloride: 102 mmol/L (ref 98–111)
Creatinine, Ser: 1.15 mg/dL (ref 0.61–1.24)
GFR, Estimated: >60 mL/min
Glucose, Bld: 116 mg/dL — ABNORMAL HIGH (ref 70–99)
Potassium: 3.5 mmol/L (ref 3.5–5.1)
Sodium: 141 mmol/L (ref 135–145)

## 2023-01-26 LAB — RESP PANEL BY RT-PCR (RSV, FLU A&B, COVID)  RVPGX2
Influenza A by PCR: NEGATIVE
Influenza B by PCR: NEGATIVE
Resp Syncytial Virus by PCR: NEGATIVE
SARS Coronavirus 2 by RT PCR: NEGATIVE

## 2023-01-26 LAB — HEPARIN LEVEL (UNFRACTIONATED): Heparin Unfractionated: >1.1 [IU]/mL — ABNORMAL HIGH (ref 0.30–0.70)

## 2023-01-26 LAB — APTT
aPTT: 200 s (ref 24–36)
aPTT: 76 s — ABNORMAL HIGH (ref 24–36)

## 2023-01-26 LAB — TROPONIN I (HIGH SENSITIVITY)
Troponin I (High Sensitivity): 188 ng/L (ref <18)
Troponin I (High Sensitivity): 642 ng/L (ref <18)
Troponin I (High Sensitivity): 65 ng/L — ABNORMAL HIGH (ref <18)
Troponin I (High Sensitivity): 814 ng/L (ref <18)
Troponin I (High Sensitivity): 870 ng/L (ref <18)

## 2023-01-26 LAB — GLUCOSE, CAPILLARY
Glucose-Capillary: 133 mg/dL — ABNORMAL HIGH (ref 70–99)
Glucose-Capillary: 127 mg/dL — ABNORMAL HIGH (ref 70–99)
Glucose-Capillary: 112 mg/dL — ABNORMAL HIGH (ref 70–99)
Glucose-Capillary: 125 mg/dL — ABNORMAL HIGH (ref 70–99)
Glucose-Capillary: 114 mg/dL — ABNORMAL HIGH (ref 70–99)

## 2023-01-26 LAB — COOXEMETRY PANEL
Carboxyhemoglobin: 1.7 % — ABNORMAL HIGH (ref 0.5–1.5)
Methemoglobin: <0.7 % (ref 0.0–1.5)
O2 Saturation: 63.8 %
Total hemoglobin: 11.6 g/dL — ABNORMAL LOW (ref 12.0–16.0)
Total oxygen content: 62.7 %

## 2023-01-26 LAB — LACTIC ACID, PLASMA
Lactic Acid, Venous: 1.3 mmol/L (ref 0.5–1.9)
Lactic Acid, Venous: 1.4 mmol/L (ref 0.5–1.9)
Lactic Acid, Venous: 1.5 mmol/L (ref 0.5–1.9)
Lactic Acid, Venous: 3.4 mmol/L (ref 0.5–1.9)
Lactic Acid, Venous: 4.3 mmol/L (ref 0.5–1.9)

## 2023-01-26 LAB — BRAIN NATRIURETIC PEPTIDE: B Natriuretic Peptide: 1004.9 pg/mL — ABNORMAL HIGH (ref 0.0–100.0)

## 2023-01-26 LAB — PROTIME-INR
INR: 1.5 — ABNORMAL HIGH (ref 0.8–1.2)
Prothrombin Time: 18.2 s — ABNORMAL HIGH (ref 11.4–15.2)

## 2023-01-26 LAB — STREP PNEUMONIAE URINARY ANTIGEN: Strep Pneumo Urinary Antigen: NEGATIVE

## 2023-01-26 LAB — MAGNESIUM
Magnesium: 1.9 mg/dL (ref 1.7–2.4)
Magnesium: 2.6 mg/dL — ABNORMAL HIGH (ref 1.7–2.4)

## 2023-01-26 LAB — CBG MONITORING, ED: Glucose-Capillary: 148 mg/dL — ABNORMAL HIGH (ref 70–99)

## 2023-01-26 LAB — ETHANOL: Alcohol, Ethyl (B): <10 mg/dL (ref <10)

## 2023-01-26 LAB — HIV ANTIBODY (ROUTINE TESTING W REFLEX): HIV Screen 4th Generation wRfx: NONREACTIVE

## 2023-01-26 LAB — PROCALCITONIN: Procalcitonin: <0.1 ng/mL

## 2023-01-26 LAB — PHOSPHORUS: Phosphorus: 7.3 mg/dL — ABNORMAL HIGH (ref 2.5–4.6)

## 2023-01-26 LAB — MRSA NEXT GEN BY PCR, NASAL: MRSA by PCR Next Gen: NOT DETECTED

## 2023-01-26 MED ORDER — POLYETHYLENE GLYCOL 3350 17 G PO PACK: 17.0000 g | PACK | Freq: Every day | ORAL | Status: DC | PRN

## 2023-01-26 MED ORDER — FENTANYL BOLUS VIA INFUSION
100.0000 ug | Freq: Once | INTRAVENOUS | Status: AC
  Administered 2023-01-26: 100 ug via INTRAVENOUS
  Filled 2023-01-26: qty 100

## 2023-01-26 MED ORDER — KETAMINE HCL-SODIUM CHLORIDE 1000-0.9 MG/100ML-% IV SOLN
0.5000 mg/kg/h | INTRAVENOUS | Status: DC
  Administered 2023-01-26: 0.5 mg/kg/h via INTRAVENOUS
  Filled 2023-01-26: qty 100

## 2023-01-26 MED ORDER — DEXMEDETOMIDINE HCL IN NACL 400 MCG/100ML IV SOLN
0.0000 ug/kg/h | INTRAVENOUS | Status: DC
  Administered 2023-01-26: 1.2 ug/kg/h via INTRAVENOUS
  Administered 2023-01-26: 1 ug/kg/h via INTRAVENOUS
  Administered 2023-01-26: 0.4 ug/kg/h via INTRAVENOUS
  Administered 2023-01-27: 0.6 ug/kg/h via INTRAVENOUS
  Administered 2023-01-27: 1.1 ug/kg/h via INTRAVENOUS
  Administered 2023-01-27: 1.2 ug/kg/h via INTRAVENOUS
  Filled 2023-01-26 (×7): qty 100

## 2023-01-26 MED ORDER — HEPARIN SODIUM (PORCINE) 5000 UNIT/ML IJ SOLN: 5000.0000 [IU] | Freq: Three times a day (TID) | INTRAMUSCULAR | Status: DC

## 2023-01-26 MED ORDER — EPINEPHRINE 1 MG/10ML IJ SOSY
PREFILLED_SYRINGE | INTRAMUSCULAR | Status: AC | PRN
  Administered 2023-01-26: 1 mg via INTRAVENOUS

## 2023-01-26 MED ORDER — HEPARIN (PORCINE) 25000 UT/250ML-% IV SOLN
1250.0000 [IU]/h | INTRAVENOUS | Status: DC
  Administered 2023-01-26 – 2023-01-27 (×2): 700 [IU]/h via INTRAVENOUS
  Administered 2023-01-29: 1100 [IU]/h via INTRAVENOUS
  Administered 2023-01-30: 1250 [IU]/h via INTRAVENOUS
  Filled 2023-01-26 (×4): qty 250

## 2023-01-26 MED ORDER — PROPOFOL 1000 MG/100ML IV EMUL
INTRAVENOUS | Status: AC
  Administered 2023-01-26: 10 ug/kg/min via INTRAVENOUS
  Filled 2023-01-26: qty 100

## 2023-01-26 MED ORDER — NITROGLYCERIN IN D5W 200-5 MCG/ML-% IV SOLN
0.0000 ug/min | INTRAVENOUS | Status: DC
  Administered 2023-01-26: 10 ug/min via INTRAVENOUS
  Filled 2023-01-26: qty 250

## 2023-01-26 MED ORDER — ROCURONIUM BROMIDE 10 MG/ML (PF) SYRINGE: 100.0000 mg | PREFILLED_SYRINGE | Freq: Once | INTRAVENOUS | Status: AC

## 2023-01-26 MED ORDER — ORAL CARE MOUTH RINSE: 15.0000 mL | OROMUCOSAL | Status: DC | PRN

## 2023-01-26 MED ORDER — MAGNESIUM SULFATE 2 GM/50ML IV SOLN
2.0000 g | Freq: Once | INTRAVENOUS | Status: AC
  Administered 2023-01-26: 2 g via INTRAVENOUS
  Filled 2023-01-26: qty 50

## 2023-01-26 MED ORDER — CHLORHEXIDINE GLUCONATE CLOTH 2 % EX PADS
6.0000 | MEDICATED_PAD | Freq: Every day | CUTANEOUS | Status: DC
  Administered 2023-01-26 – 2023-01-31 (×6): 6 via TOPICAL

## 2023-01-26 MED ORDER — ROCURONIUM BROMIDE 10 MG/ML (PF) SYRINGE
PREFILLED_SYRINGE | INTRAVENOUS | Status: AC
  Administered 2023-01-26: 100 mg via INTRAVENOUS
  Filled 2023-01-26: qty 10

## 2023-01-26 MED ORDER — IPRATROPIUM-ALBUTEROL 0.5-2.5 (3) MG/3ML IN SOLN
3.0000 mL | Freq: Four times a day (QID) | RESPIRATORY_TRACT | Status: DC
  Administered 2023-01-26 (×2): 3 mL via RESPIRATORY_TRACT
  Filled 2023-01-26 (×2): qty 3
  Filled 2023-01-26: qty 9

## 2023-01-26 MED ORDER — MIDAZOLAM HCL 2 MG/2ML IJ SOLN
INTRAMUSCULAR | Status: AC
  Administered 2023-01-26: 2 mg via INTRAVENOUS
  Filled 2023-01-26: qty 2

## 2023-01-26 MED ORDER — SODIUM BICARBONATE 8.4 % IV SOLN
INTRAVENOUS | Status: AC
  Administered 2023-01-26: 100 meq via INTRAVENOUS
  Filled 2023-01-26: qty 50

## 2023-01-26 MED ORDER — VASOPRESSIN 20 UNITS/100 ML INFUSION FOR SHOCK
0.0000 [IU]/min | INTRAVENOUS | Status: DC
  Administered 2023-01-26: 0.03 [IU]/min via INTRAVENOUS
  Filled 2023-01-26: qty 100

## 2023-01-26 MED ORDER — IPRATROPIUM-ALBUTEROL 0.5-2.5 (3) MG/3ML IN SOLN
3.0000 mL | RESPIRATORY_TRACT | Status: DC
  Administered 2023-01-26 – 2023-01-27 (×4): 3 mL via RESPIRATORY_TRACT
  Filled 2023-01-26 (×4): qty 3

## 2023-01-26 MED ORDER — SODIUM BICARBONATE 8.4 % IV SOLN
INTRAVENOUS | Status: AC
  Filled 2023-01-26: qty 50

## 2023-01-26 MED ORDER — NOREPINEPHRINE 4 MG/250ML-% IV SOLN
INTRAVENOUS | Status: AC
  Administered 2023-01-26: 2 ug/min via INTRAVENOUS
  Filled 2023-01-26: qty 250

## 2023-01-26 MED ORDER — LACTATED RINGERS IV BOLUS
1000.0000 mL | Freq: Once | INTRAVENOUS | Status: AC
  Administered 2023-01-26: 1000 mL via INTRAVENOUS

## 2023-01-26 MED ORDER — ETOMIDATE 2 MG/ML IV SOLN: 20.0000 mg | Freq: Once | INTRAVENOUS | Status: AC

## 2023-01-26 MED ORDER — FAMOTIDINE 20 MG PO TABS
20.0000 mg | ORAL_TABLET | Freq: Two times a day (BID) | ORAL | Status: DC
  Administered 2023-01-26 – 2023-01-27 (×2): 20 mg
  Filled 2023-01-26 (×3): qty 1

## 2023-01-26 MED ORDER — SODIUM CHLORIDE 0.9 % IV SOLN
1.0000 g | INTRAVENOUS | Status: AC
  Administered 2023-01-26 – 2023-01-30 (×5): 1 g via INTRAVENOUS
  Filled 2023-01-26 (×5): qty 10

## 2023-01-26 MED ORDER — HEPARIN (PORCINE) 25000 UT/250ML-% IV SOLN
900.0000 [IU]/h | INTRAVENOUS | Status: DC
  Administered 2023-01-26: 900 [IU]/h via INTRAVENOUS
  Filled 2023-01-26: qty 250

## 2023-01-26 MED ORDER — FENTANYL CITRATE PF 50 MCG/ML IJ SOSY
PREFILLED_SYRINGE | INTRAMUSCULAR | Status: AC
  Filled 2023-01-26: qty 1

## 2023-01-26 MED ORDER — FENTANYL 2500MCG IN NS 250ML (10MCG/ML) PREMIX INFUSION
100.0000 ug/h | INTRAVENOUS | Status: DC
  Administered 2023-01-26: 100 ug/h via INTRAVENOUS
  Filled 2023-01-26: qty 250

## 2023-01-26 MED ORDER — FENTANYL BOLUS VIA INFUSION
100.0000 ug | Freq: Once | INTRAVENOUS | Status: AC
  Administered 2023-01-26: 100 ug via INTRAVENOUS

## 2023-01-26 MED ORDER — IPRATROPIUM-ALBUTEROL 0.5-2.5 (3) MG/3ML IN SOLN
3.0000 mL | Freq: Four times a day (QID) | RESPIRATORY_TRACT | Status: DC | PRN
  Administered 2023-01-28: 3 mL via RESPIRATORY_TRACT

## 2023-01-26 MED ORDER — MIDAZOLAM HCL 2 MG/2ML IJ SOLN: 2.0000 mg | Freq: Once | INTRAMUSCULAR | Status: AC

## 2023-01-26 MED ORDER — ORAL CARE MOUTH RINSE
15.0000 mL | OROMUCOSAL | Status: DC
  Administered 2023-01-26 – 2023-01-28 (×24): 15 mL via OROMUCOSAL

## 2023-01-26 MED ORDER — SODIUM CHLORIDE 0.9 % IV SOLN
500.0000 mg | INTRAVENOUS | Status: AC
  Administered 2023-01-26 – 2023-01-28 (×3): 500 mg via INTRAVENOUS
  Filled 2023-01-26 (×3): qty 5

## 2023-01-26 MED ORDER — FENTANYL BOLUS VIA INFUSION: 50.0000 ug | INTRAVENOUS | Status: DC | PRN

## 2023-01-26 MED ORDER — ALBUTEROL SULFATE (2.5 MG/3ML) 0.083% IN NEBU
INHALATION_SOLUTION | RESPIRATORY_TRACT | Status: AC
  Administered 2023-01-26: 10 mg via RESPIRATORY_TRACT
  Filled 2023-01-26: qty 12

## 2023-01-26 MED ORDER — ALBUTEROL SULFATE (2.5 MG/3ML) 0.083% IN NEBU: 10.0000 mg | INHALATION_SOLUTION | Freq: Once | RESPIRATORY_TRACT | Status: AC

## 2023-01-26 MED ORDER — FENTANYL 2500MCG IN NS 250ML (10MCG/ML) PREMIX INFUSION
INTRAVENOUS | Status: AC
  Administered 2023-01-26: 100 ug/h via INTRAVENOUS
  Filled 2023-01-26: qty 250

## 2023-01-26 MED ORDER — ETOMIDATE 2 MG/ML IV SOLN
INTRAVENOUS | Status: AC
  Administered 2023-01-26: 20 mg via INTRAVENOUS
  Filled 2023-01-26: qty 10

## 2023-01-26 MED ORDER — NITROGLYCERIN IN D5W 200-5 MCG/ML-% IV SOLN: 20.0000 ug/min | INTRAVENOUS | Status: DC

## 2023-01-26 MED ORDER — ACETAMINOPHEN 325 MG PO TABS
650.0000 mg | ORAL_TABLET | Freq: Four times a day (QID) | ORAL | Status: DC | PRN
  Administered 2023-01-26: 650 mg
  Filled 2023-01-26: qty 2

## 2023-01-26 MED ORDER — METHYLPREDNISOLONE SODIUM SUCC 40 MG IJ SOLR
40.0000 mg | Freq: Every day | INTRAMUSCULAR | Status: DC
  Administered 2023-01-26 – 2023-01-27 (×2): 40 mg via INTRAVENOUS
  Filled 2023-01-26 (×2): qty 1

## 2023-01-26 MED ORDER — SODIUM BICARBONATE 8.4 % IV SOLN: 100.0000 meq | Freq: Once | INTRAVENOUS | Status: AC

## 2023-01-26 MED ORDER — HEPARIN BOLUS VIA INFUSION
4000.0000 [IU] | Freq: Once | INTRAVENOUS | Status: AC
  Administered 2023-01-26: 4000 [IU] via INTRAVENOUS
  Filled 2023-01-26: qty 4000

## 2023-01-26 MED ORDER — ASPIRIN 325 MG PO TABS
325.0000 mg | ORAL_TABLET | Freq: Once | ORAL | Status: AC
Start: 2023-01-26 — End: 2023-01-26
  Administered 2023-01-26: 325 mg via ORAL
  Filled 2023-01-26: qty 1

## 2023-01-26 MED ORDER — DOCUSATE SODIUM 100 MG PO CAPS
100.0000 mg | ORAL_CAPSULE | Freq: Two times a day (BID) | ORAL | Status: DC | PRN
Start: 2023-01-26 — End: 2023-01-31

## 2023-01-26 MED ORDER — NOREPINEPHRINE 16 MG/250ML-% IV SOLN
0.0000 ug/min | INTRAVENOUS | Status: DC
Start: 2023-01-26 — End: 2023-01-27
  Administered 2023-01-26: 15 ug/min via INTRAVENOUS
  Filled 2023-01-26: qty 250

## 2023-01-26 MED ORDER — PROPOFOL 1000 MG/100ML IV EMUL
5.0000 ug/kg/min | INTRAVENOUS | Status: DC
Start: 2023-01-26 — End: 2023-01-26

## 2023-01-26 MED ORDER — IOHEXOL 350 MG/ML SOLN
75.0000 mL | Freq: Once | INTRAVENOUS | Status: AC | PRN
Start: 2023-01-26 — End: 2023-01-26
  Administered 2023-01-26: 75 mL via INTRAVENOUS

## 2023-01-26 MED ORDER — LACTATED RINGERS IV BOLUS
500.0000 mL | Freq: Once | INTRAVENOUS | Status: AC
Start: 2023-01-26 — End: 2023-01-26
  Administered 2023-01-26: 500 mL via INTRAVENOUS

## 2023-01-26 MED ORDER — NOREPINEPHRINE 4 MG/250ML-% IV SOLN
0.0000 ug/min | INTRAVENOUS | Status: DC
Start: 2023-01-26 — End: 2023-01-26
  Filled 2023-01-26: qty 250

## 2023-01-26 NOTE — ED Notes (Signed)
Katrinka Blazing, MD notified of critical lactic acid of 3.4

## 2023-01-26 NOTE — Progress Notes (Signed)
Initial Nutrition Assessment  DOCUMENTATION CODES:   Underweight  INTERVENTION:   Once appropriate for tube feeds, recommend:  Vital 1.2@60ml /hr- Initiate at 65ml/hr and increase by 32ml/hr q 8 hours until goal rate is reached.   Free water flushes 30ml q4 hours to maintain tube patency   Regimen provides 1728kcal/day, 108g/day protein and 1334ml/day of free water.   Pt at high refeed risk; recommend monitor potassium, magnesium and phosphorus labs daily until stable  Daily weights  NUTRITION DIAGNOSIS:   Inadequate oral intake related to inability to eat (pt sedated and ventilated) as evidenced by NPO status.  GOAL:   Provide needs based on ASPEN/SCCM guidelines  MONITOR:   Vent status, Labs, Weight trends, TF tolerance, Skin, I & O's  REASON FOR ASSESSMENT:   Ventilator    ASSESSMENT:   62 y/o male with h/o cocaine use and self reported COPD and CHF who is admitted with PNA, sepsis, NSTEMI and PEA cardiac arrest.  RD working remotely.  Pt sedated and ventilated. OGT in place but needs advancement. Will plan to initiate tube feeds once ok per MD. Pt is likely at refeed risk. There is no weight history documented in chart. Pt is at high risk for malnutrition. RD will obtain NFPE at follow up.    Medications reviewed and include: aspirin, pepcid, solu-medrol, azithromycin, ceftriaxone, heparin, levophed, vasopressin   Labs reviewed: K 3.5 wnl, Ca 11.7(H), P 7.3(H), Mg 2.6(H) Wbc- 14.6(H) Cbgs- 127, 133, 148 x 24 hrs   Patient is currently intubated on ventilator support MV: 5.3 L/min Temp (24hrs), Avg:98.7 F (37.1 C), Min:96 F (35.6 C), Max:101.3 F (38.5 C)  Propofol: none   MAP- >44mmHg   UOP-   NUTRITION - FOCUSED PHYSICAL EXAM: Unable to perform at this time   Diet Order:   Diet Order             Diet NPO time specified  Diet effective now                  EDUCATION NEEDS:   No education needs have been identified at this  time  Skin:  Skin Assessment: Reviewed RN Assessment  Last BM:  pta  Height:   Ht Readings from Last 1 Encounters:  01/26/23 5\' 10"  (1.778 m)    Weight:   Wt Readings from Last 1 Encounters:  01/26/23 56.8 kg    Ideal Body Weight:  75.45 kg  BMI:  Body mass index is 17.97 kg/m.  Estimated Nutritional Needs:   Kcal:  1696kcal/day  Protein:  90-105g/day  Fluid:  1.7-2.0L/day  Betsey Holiday MS, RD, LDN If unable to be reached, please send secure chat to "RD inpatient" available from 8:00a-4:00p daily

## 2023-01-26 NOTE — ED Notes (Signed)
Intubation: 20mg  etomidate given 0012 100mg  Rocuronium given after 0012 Fentanyl bolus given 0013 Proprofol 70mcg/kg 0013 7.5 ETT tube placed at 23cm at the teeth with positive color change.

## 2023-01-26 NOTE — Progress Notes (Signed)
*  PRELIMINARY RESULTS* Echocardiogram 2D Echocardiogram has been performed.  Cristela Blue 01/26/2023, 12:06 PM

## 2023-01-26 NOTE — Procedures (Signed)
Central Venous Catheter Insertion Procedure Note  Josemaria Tolman  [id number]  12-19-1960  Date:01/26/23  Time:8:20 AM   Provider Performing:Jerrell Mangel Earnest Conroy   Procedure: Insertion of Non-tunneled Central Venous Catheter(36556) with US guidance (40981)   Indication(s) Medication administration  Consent Unable to obtain consent due to emergent nature of procedure.  Anesthesia Topical only with 1% lidocaine   Timeout Verified patient identification, verified procedure, site/side was marked, verified correct patient position, special equipment/implants available, medications/allergies/relevant history reviewed, required imaging and test results available.  Sterile Technique Maximal sterile technique including full sterile barrier drape, hand hygiene, sterile gown, sterile gloves, mask, hair covering, sterile ultrasound probe cover (if used).  Procedure Description Area of catheter insertion was cleaned with chlorhexidine and draped in sterile fashion.  With real-time ultrasound guidance a central venous catheter was placed into the left internal jugular vein. Nonpulsatile blood flow and easy flushing noted in all ports.  The catheter was sutured in place and sterile dressing applied.  Complications/Tolerance None; patient tolerated the procedure well. Chest X-ray is ordered to verify placement for internal jugular or subclavian cannulation.   Chest x-ray is not ordered for femoral cannulation.  EBL Minimal  Specimen(s) None  Zada Girt, AGNP  Pulmonary/Critical Care Pager [phone] (please enter 7 digits) PCCM Consult Pager [phone] (please enter 7 digits)

## 2023-01-26 NOTE — ED Notes (Signed)
2G magnesium push given 2 G calcium given 2 Amps/ 50mL given as well

## 2023-01-26 NOTE — ED Notes (Signed)
Bicarb 1 amp x1 IVP

## 2023-01-26 NOTE — Plan of Care (Signed)

## 2023-01-26 NOTE — ED Notes (Signed)
Patient pulseless with low BP, PEA on monitor 0027, CPR started Epi given 0029 Calcium given now

## 2023-01-26 NOTE — Progress Notes (Signed)
ANTICOAGULATION CONSULT NOTE  Pharmacy Consult for heparin infusion Indication: ACS/STEMI  Not on File  Patient Measurements: Height: 5\' 10"  (177.8 cm) Weight: 56.8 kg (125 lb 3.5 oz) IBW/kg (Calculated) : 73 Heparin Dosing Weight: 68 kg  Vital Signs: Temp: 100.8 F (38.2 C) (12/26 2200) Temp Source: Bladder (12/26 2000) BP: 148/65 (12/26 2200) Pulse Rate: 64 (12/26 2200)  Labs: Recent Labs    01/26/23 0010 01/26/23 0255 01/26/23 0900 01/26/23 1003 01/26/23 1225 01/26/23 2146  HGB 14.4 12.9*  --   --   --   --   HCT 46.4 41.4  --   --   --   --   PLT 366 311  --   --   --   --   APTT  --   --  200*  --   --  76*  LABPROT  --   --  18.2*  --   --   --   INR  --   --  1.5*  --   --   --   HEPARINUNFRC  --   --  >1.10*  --   --   --   CREATININE 1.21 1.15  --   --   --   --   CKTOTAL  --   --  151  --   --   --   TROPONINIHS 65* 188* 870* 814* 642*  --     Estimated Creatinine Clearance: 52.1 mL/min (by C-G formula based on SCr of 1.15 mg/dL).   Medical History: No past medical history on file.  Assessment: Pt is a 62 yo male presenting to ED in resp distress found with elevated troponin I and BNP.  Goal of Therapy:  Heparin level 0.3-0.7 units/ml Monitor platelets by anticoagulation protocol: Yes  Date/Time: aPTT / HL: Rate / Comment: 12/26@0900  200 / >1.10 Supratherapeutic@900   units/hr 12/26@2146    76                   Therapeutic x 1 at 700 units/hr   Plan:  Continue heparin infusion at 700 units/hr Will check aPTT in 6 hr for confirmation and HL daily Will use aPTT to guide dosing until both levels correlate, then will transition to HL dosing CBC daily while on heparin  Clovia Cuff, PharmD, BCPS 01/26/2023 10:13 PM

## 2023-01-26 NOTE — ED Notes (Signed)
Report called to Aram Beecham in the ICU

## 2023-01-26 NOTE — Consult Note (Signed)
Cardiology Consultation   Patient ID: Arthor Roberson MRN: [id number]; DOB: [birth year]  Admit date: 01/26/2023 Date of Consult: 01/26/2023  PCP:  No primary care provider on file.   [facility] Providers Cardiologist:  None      New consult completed by Dr Mariah Milling  Patient Profile:   Scott Roberson is a 62 y.o. male with a hx of self reported COPD and heart failure who is being seen 01/26/2023 for the evaluation of elevated high-sensitivity troponin at the request of Dr Katrinka Blazing.  History of Present Illness:   Mr. Scott Roberson presented to the [facility] emergency department on 11/26/2022 at 11 midnight from his group home/boardinghouse via EMS for evaluation of respiratory failure.  Majority of history is provided by EMS as patient arrived to the emergency department obtunded and encephalopathic on arrival unable to provide any history.  Staff at the group home stated he had been calling out for shortness of breath for couple of hours and they found him tripoding with oxygen saturations in the 40s on room air.  Placed on nonrebreather at 15 L/min.  EMS run sheet documented patient had taken a breathing treatment multiple times without relief.  He was noted to be clammy, diaphoretic to the touch was unable to speak in full word sentences.  He remained hypoxic in the 50s on a nonrebreather and therefore was placed on CPAP.  Due to difficulty obtaining IV access AMA was unable to give any mag sulfate.  He was given 2 inches of Nitropaste to the left upper chest wall and was transported to the emergency department.  He was immediately intubated for airway protection on arrival.  Postintubation the patient went to PEA cardiac arrest received 1 round of CPR and a dose of epinephrine.  Was noted with wide-complex organized rhythm concerning for metabolic derangement.  Patient received calcium gluconate x 2, 2 A of sodium bicarb, 2 g of magnesium and ROSC was achieved.  Patient became hypotensive and  immediately became pulseless.  He received CPR for the second time of 1 dose of epi, calcium gluconate 1 amp, and 1 amp of sodium bicarb with immediate return of ROSC.  EKG obtained was concerning for STEMI.  Code STEMI was activated and STEMI on-call doctor Dr. Kirke Corin came to the bedside.  Code STEMI was canceled as I believe the patient did not meet STEMI criteria.  Vital signs: Blood pressure recorded, pulse of 102, respirations of 20, temperature 97.7  Pertinent labs: Blood glucose 175, total protein of 8.6, BNP 1004.9, lactic acid of 3.4, urine drug screen positive for cocaine, high-sensitivity troponin 65, COVID PCR was negative, CBC was unremarkable  Imaging: X-ray s/p intubation revealed endotracheal tube 7 cm above the carina and no acute cardiopulmonary disease; CTA of the chest PE protocol revealed airspace opacity in the left lower lobe concerning for early infiltrate/pneumonia, moderate to severe emphysema, endotracheal tube tip in the mid trachea; CT of the head revealed no acute intracranial abnormality  Medications administered in the emergency department: 1 L of lactated Ringer's, fentanyl, rocuronium, etomidate, ketamine, epinephrine, and DuoNebs  No past medical history on file.     Home Medications:  Prior to Admission medications   Not on File    Inpatient Medications: Scheduled Meds:  aspirin  325 mg Oral Once   Chlorhexidine Gluconate Cloth  6 each Topical Daily   famotidine  20 mg Per Tube BID   ipratropium-albuterol  3 mL Nebulization Q6H   methylPREDNISolone (SOLU-MEDROL) injection  40 mg  Intravenous Daily   mouth rinse  15 mL Mouth Rinse Q2H   Continuous Infusions:  azithromycin 250 mL/hr at 01/26/23 1610   cefTRIAXone (ROCEPHIN)  IV Stopped (01/26/23 0555)   dexmedetomidine (PRECEDEX) IV infusion 0.4 mcg/kg/hr (01/26/23 1017)   fentaNYL infusion INTRAVENOUS 100 mcg/hr (01/26/23 0621)   magnesium sulfate bolus IVPB     norepinephrine (LEVOPHED) Adult  infusion 35 mcg/min (01/26/23 1030)   vasopressin 0.02 Units/min (01/26/23 1038)   PRN Meds: docusate sodium, ipratropium-albuterol, mouth rinse, polyethylene glycol  Allergies:   Not on File  Social History:   Social History   Socioeconomic History   Marital status: Not on file    Spouse name: Not on file   Number of children: Not on file   Years of education: Not on file   Highest education level: Not on file  Occupational History   Not on file  Tobacco Use   Smoking status: Not on file   Smokeless tobacco: Not on file  Substance and Sexual Activity   Alcohol use: Not on file   Drug use: Not on file   Sexual activity: Not on file  Other Topics Concern   Not on file  Social History Narrative   Not on file   Social Drivers of Health   Financial Resource Strain: Not on file  Food Insecurity: Not on file  Transportation Needs: Not on file  Physical Activity: Not on file  Stress: Not on file  Social Connections: Not on file  Intimate Partner Violence: Not on file    Family History:   No family history on file.   ROS:  Please see the history of present illness.  Review of Systems  Reason unable to perform ROS: patient is currently intubated and sedated.    All other ROS reviewed and negative.     Physical Exam/Data:   Vitals:   01/26/23 0650 01/26/23 0655 01/26/23 0700 01/26/23 0703  BP: (!) 54/39 (!) 57/48 (!) 53/44   Pulse: 69 72 71   Resp: 20 20 20    Temp: 99.9 F (37.7 C) 99.9 F (37.7 C) 99.9 F (37.7 C)   TempSrc:      SpO2: 100% 100% 100% 98%  Weight:      Height:        Intake/Output Summary (Last 24 hours) at 01/26/2023 1116 Last data filed at 01/26/2023 9604 Gross per 24 hour  Intake 2135.39 ml  Output 590 ml  Net 1545.39 ml      01/26/2023    1:46 AM 01/26/2023   12:34 AM  Last 3 Weights  Weight (lbs) 125 lb 3.5 oz 150 lb  Weight (kg) 56.8 kg 68.04 kg     Body mass index is 17.97 kg/m.  General:  Intubated and sedated, in no  acte distress HEENT: normal Neck: + JVD Vascular: No carotid bruits; Distal pulses 2+ bilaterally Cardiac:  normal S1, S2; RRR; no murmur  Lungs:  clear with diminished bases to auscultation bilaterally, respirations are vent assisted Abd: soft, nontender, no hepatomegaly  Ext: no edema Musculoskeletal:  No deformities, BUE and BLE strength normal and equal Skin: warm and dry  Neuro: currently intubated and sedated Psych:  currently intubated and sedated  EKG:  The EKG was personally reviewed and demonstrates:  sinus tachycardia rate of 101, LAE, LVH with early repolarization, ST depression and TWI in inferior and anterior leads, Q waves noted Telemetry:  Telemetry was personally reviewed and demonstrates:  sinus rates 60-70, 11 beat run  of NSVT, ST depression with TWI noted  Relevant CV Studies: Echocardiogram ordered and pending  Laboratory Data:  High Sensitivity Troponin:   Recent Labs  Lab 01/26/23 0010 01/26/23 0255 01/26/23 0900  TROPONINIHS 65* 188* 870*     Chemistry Recent Labs  Lab 01/26/23 0010 01/26/23 0255  NA 137 141  K 3.7 3.5  CL 100 102  CO2 24 26  GLUCOSE 175* 116*  BUN 9 9  CREATININE 1.21 1.15  CALCIUM 9.2 11.7*  MG 1.9 2.6*  GFRNONAA >60 >60  ANIONGAP 13 13    Recent Labs  Lab 01/26/23 0010  PROT 8.6*  ALBUMIN 4.1  AST 23  ALT 12  ALKPHOS 103  BILITOT 0.4   Lipids No results for input(s): "CHOL", "TRIG", "HDL", "LABVLDL", "LDLCALC", "CHOLHDL" in the last 168 hours.  Hematology Recent Labs  Lab 01/26/23 0010 01/26/23 0255  WBC 9.3 14.6*  RBC 5.05 4.53  HGB 14.4 12.9*  HCT 46.4 41.4  MCV 91.9 91.4  MCH 28.5 28.5  MCHC 31.0 31.2  RDW 12.8 12.7  PLT 366 311   Thyroid No results for input(s): "TSH", "FREET4" in the last 168 hours.  BNP Recent Labs  Lab 01/26/23 0010  BNP 1,004.9*    DDimer No results for input(s): "DDIMER" in the last 168 hours.   Radiology/Studies:  DG Chest Port 1 View Result Date:  01/26/2023 CLINICAL DATA:  Central line placement. EXAM: PORTABLE CHEST 1 VIEW COMPARISON:  Same day. FINDINGS: Interval placement of left internal jugular catheter with distal tip in expected position of the SVC. No pneumothorax. IMPRESSION: Interval placement of left internal jugular catheter with distal tip in expected position of the SVC. Electronically Signed   By: Lupita Raider M.D.   On: 01/26/2023 08:53   DG Chest Port 1 View Result Date: 01/26/2023 CLINICAL DATA:  Respiratory failure. EXAM: PORTABLE CHEST 1 VIEW COMPARISON:  Same day. FINDINGS: The heart size and mediastinal contours are within normal limits. Endotracheal tube is in grossly good position. No acute pulmonary disease. The visualized skeletal structures are unremarkable. IMPRESSION: No active disease. Electronically Signed   By: Lupita Raider M.D.   On: 01/26/2023 07:52   CT HEAD WO CONTRAST ( ) Result Date: 01/26/2023 CLINICAL DATA:  Hypertensive, altered mental status EXAM: CT HEAD WITHOUT CONTRAST TECHNIQUE: Contiguous axial images were obtained from the base of the skull through the vertex without intravenous contrast. RADIATION DOSE REDUCTION: This exam was performed according to the departmental dose-optimization program which includes automated exposure control, adjustment of the mA and/or kV according to patient size and/or use of iterative reconstruction technique. COMPARISON:  None Available. FINDINGS: Brain: No acute intracranial abnormality. Specifically, no hemorrhage, hydrocephalus, mass lesion, acute infarction, or significant intracranial injury. Vascular: No hyperdense vessel or unexpected calcification. Skull: No acute calvarial abnormality. Sinuses/Orbits: No acute findings Other: None IMPRESSION: No acute intracranial abnormality. Electronically Signed   By: Charlett Nose M.D.   On: 01/26/2023 01:47   CT Angio Chest Pulmonary Embolism (PE) W or WO Contrast Result Date: 01/26/2023 CLINICAL DATA:  Pulmonary  embolism (PE) suspected, high prob. Code STEMI. Respiratory distress, hypoxia. EXAM: CT ANGIOGRAPHY CHEST WITH CONTRAST TECHNIQUE: Multidetector CT imaging of the chest was performed using the standard protocol during bolus administration of intravenous contrast. Multiplanar CT image reconstructions and MIPs were obtained to evaluate the vascular anatomy. RADIATION DOSE REDUCTION: This exam was performed according to the departmental dose-optimization program which includes automated exposure control, adjustment of the mA and/or  kV according to patient size and/or use of iterative reconstruction technique. CONTRAST:  75mL OMNIPAQUE IOHEXOL 350 MG/ML SOLN COMPARISON:  None Available. FINDINGS: Cardiovascular: No filling defects in the pulmonary arteries to suggest pulmonary emboli. Heart is normal size. Aorta is normal caliber. Scattered aortic calcifications. Mediastinum/Nodes: Endotracheal tube tip in the midtrachea. Layering mucus within the trachea. Thyroid and esophagus unremarkable. Lungs/Pleura: Moderate to advanced emphysema. Airspace opacity in the left lower lobe may reflect early infiltrate/pneumonia. No confluent opacity on the right. No effusions. Upper Abdomen: No acute findings Musculoskeletal: Chest wall soft tissues are unremarkable. No acute bony abnormality. Review of the MIP images confirms the above findings. IMPRESSION: Airspace opacity in the left lower lobe concerning for early infiltrate/pneumonia. Moderate to severe emphysema. Endotracheal tube tip in the midtrachea. No evidence of pulmonary embolus. Aortic Atherosclerosis (ICD10-I70.0) and Emphysema (ICD10-J43.9). Electronically Signed   By: Charlett Nose M.D.   On: 01/26/2023 01:46   DG Abdomen 1 View Result Date: 01/26/2023 CLINICAL DATA:  OG tube placement EXAM: ABDOMEN - 1 VIEW COMPARISON:  Chest 01/26/2023 FINDINGS: Limited field of view for tube placement verification. No enteric tube is demonstrated within the field of view.  Visualized bowel gas pattern is normal. Lung bases are clear. IMPRESSION: No enteric tube is demonstrated within the field of view. Electronically Signed   By: Burman Nieves M.D.   On: 01/26/2023 01:33   DG Chest Portable 1 View Result Date: 01/26/2023 CLINICAL DATA:  Possible tension pneumothorax.  OG tube placement EXAM: PORTABLE CHEST 1 VIEW COMPARISON:  01/26/2023 FINDINGS: Endotracheal tube present with tip measuring 4.5 cm above the carina. Heart size and pulmonary vascularity are normal. Emphysematous changes in the lungs. Scattered fibrosis. No airspace disease or consolidation. No pleural effusion. No pneumothorax. Calcification of the aorta. IMPRESSION: Emphysematous changes in the lungs. No pneumothorax. No evidence of pulmonary collapse or consolidation. Electronically Signed   By: Burman Nieves M.D.   On: 01/26/2023 01:32   DG Chest Portable 1 View Result Date: 01/26/2023 CLINICAL DATA:  Intubated, respiratory failure EXAM: PORTABLE CHEST 1 VIEW COMPARISON:  None available FINDINGS: Endotracheal tube is 7 cm above the carina. Heart is normal size. Aortic atherosclerosis. No confluent airspace opacities or effusions. No acute bony abnormality. IMPRESSION: Endotracheal tube 7 cm above the carina. No acute cardiopulmonary disease. Electronically Signed   By: Charlett Nose M.D.   On: 01/26/2023 00:38     Assessment and Plan:   Acute on chronic hypoxic and hypercapnic respiratory failure/acute exacerbation of COPD/pneumonia -Presented with worsening shortness of breath and hypoxia -CTA of the chest negative for PE but consistent with infectious process -Respiratory panel by PCR negative for COVID/RSV, influenza A/B -Continued with mechanical ventilation -Titrate FiO2 to keep sats greater than equal to 90% -Continued on bronchodilators and systemic steroids -Continued management per PCCM  Status post PEA arrest x 2/Elevated high-sensitivity troponin -High-sensitivity troponin 65,  188, 870, 814, 642 -Previous code STEMI called was canceled as changes on EKG resolved after electrolyte correction -EKG revealed sinus tachycardia with left atrial enlargement, LVH with early repolarization ST depression and T wave inversions in inferior lateral leads -Increase in troponins expected status post CPR -Vasopressors continue to be needed to maintain MAP goal -Continued on heparin infusion -Echocardiogram ordered and pending -Continue on telemetry monitoring -Will likely require continued ischemic evaluation after improvement from acute process has resolved -EKG as needed for pain or changes  History of CHF, unknown type, self-reported  -BNP 1004.9 -Echocardiogram ordered and pending with  further recommendations to follow -Daily weights, I's and O's -Depending on LVEF will escalate GDMT as tolerated by blood pressure and kidney function -Chest x-ray with no acute cardiopulmonary disease noted  Sepsis due to pneumonia -Elevated lactic acid of 3.4 -Trend WBCs -Continued on antibiotic therapy -Received IV fluid hydration -Pressors have been continued to maintain a MAP with a goal of greater than 65 mmHg -Continued management per PCCM  Polysubstance abuse -Urine drug screen positive for cocaine    Risk Assessment/Risk Scores:        New York Heart Association (NYHA) Functional Class NYHA Class III        For questions or updates, please contact [facility] Please consult [email or web address] for contact info under    Signed, Syana Degraffenreid, NP  01/26/2023 11:16 AM

## 2023-01-26 NOTE — Consult Note (Signed)
PHARMACY CONSULT NOTE - ELECTROLYTES  Pharmacy Consult for Electrolyte Monitoring and Replacement   Recent Labs: Height: 5\' 10"  (177.8 cm) Weight: 56.8 kg (125 lb 3.5 oz) IBW/kg (Calculated) : 73 Estimated Creatinine Clearance: 52.1 mL/min (by C-G formula based on SCr of 1.15 mg/dL). Potassium (mmol/L)  Date Value  01/26/2023 3.5   Magnesium (mg/dL)  Date Value  16/11/9602 2.6 (H)   Calcium (mg/dL)  Date Value  54/10/8117 11.7 (H)   Albumin (g/dL)  Date Value  14/78/2956 4.1   Phosphorus (mg/dL)  Date Value  21/30/8657 7.3 (H)   Sodium (mmol/L)  Date Value  01/26/2023 141    Assessment  Scott Roberson is a 62 y.o. male presenting with respiratory distress.  Pharmacy has been consulted to monitor and replace electrolytes.  Diet: NPO, intubated Pertinent medications: N/A  Goal of Therapy: Electrolytes WNL  Plan:  No replacement currently indicated Check BMP, Mg, Phos with AM labs  Thank you for allowing pharmacy to be a part of this patient's care.  Bettey Costa, PharmD Clinical Pharmacist 01/26/2023 7:28 AM

## 2023-01-26 NOTE — ED Notes (Addendum)
BP notably low again, no pulses, CPR started 1 epi given 0053 1 calcium given 0054 1 BICARB amp 0056 0057 Pulse check, return of spontaneous circulation

## 2023-01-26 NOTE — ED Notes (Signed)
Ca+ ivp x1

## 2023-01-26 NOTE — ED Triage Notes (Signed)
Patient brought in tonight via [facility] from [facility] with complaints of respiratory distress. Patient has hx of COPD, tried multiple duonebs at home without relief. Patient found to have sats at 50% at home, placed on CPAP with minimal relief. Patient is minimally responsive on arrival. Bilateral INTs placed.

## 2023-01-26 NOTE — Progress Notes (Signed)
eLink Physician-Brief Progress Note Patient Name: Scott Roberson DOB: [birth year] MRN: [id number]   Date of Service  01/26/2023  HPI/Events of Note  ER and Cardiology note reviewed. CCM note pending. Admitted to ICU post cardiac arrest, resp failure, NSTEMI. UDS positive for cocaine. Came in with AMS and shortness of breath. CT head with nothing acute and CT PE with no PE or dissections. Sedated on vent on camera.   eICU Interventions  Will need echo and serial troponins, being given aspirin, statin and sedation while on vent. Serial ABG . Heparin drip being initiated. Call E link if needed     Intervention Category Major Interventions: Respiratory failure - evaluation and management Evaluation Type: New Patient Evaluation  Oretha Milch 01/26/2023, 5:10 AM

## 2023-01-26 NOTE — ED Notes (Signed)
Amp bicarb ivp

## 2023-01-26 NOTE — H&P (Signed)
NAME:  Scott Roberson, MRN:  [id number], DOB:  [birth year], LOS: 0 ADMISSION DATE:  01/26/2023, CONSULTATION DATE:  01/26/2023 REFERRING MD:  Delton Prairie, CHIEF COMPLAINT:  Respiratory distress   HPI  62 y.o male with no significant PMH noted on chart but self reported COPD and ?CHF who presented to the ED from a boarding house with chief complaints of respiratory distress.  Per EMS runsheet, EMS was called at the boarding house for a patient in respiratory distress. On EMS arrival, the patient was found sitting in a tripod position being assessed by BFD. BFD advised the patient' s SPO2 is in the 40s and was placed on a NRB at 15 LPM. Patient has a history of COPD and CHF, took his breathing treatment multiple times without relief. Patient was noted to be clammy, diaphoretic to the touch, and was unable to speak in full word sentences. He remained hypoxic in the 50s on NRB and was therefore placed on CPAP. Due to difficulty obtaining IV access, EMS was unable to give mag sulfate. Patient given 2 inches of nitro paste to the left upper chest wall and was transported to the ED.   ED Course: Initial vital signs showed HR of beats/minute, BP mm Hg, the RR 30 breaths/minute, and the oxygen saturation % on and a temperature of 98.59F (36.9C). Patient was lethargic with agonal respiration.  Patient was immediately intubated for airway protection.  Post intubation patient went into PEA cardiac arrest and received 1 round of CPR/epinephrine.  Was noted with bizarre wide-complex organized rhythm concerning for metabolic derangement.  Patient received calcium gluconate x 2, 2 A of sodium bicarb 2 g of magnesium with ROSC achieved.  Patient became hypotensive and immediately became pulseless.  He received CPR for the second time with 1 dose of epi, calcium gluconate 1 amp of sodium bicarb with immediate return ROSC.  EKG obtained was concerning for STEMI.  Code STEMI activated and STEMI on-call MD notified who came  to the bedside.  Code STEMI canceled per Dr. Kirke Corin who believed patient did not meet STEMI criteria.  Pertinent Labs/Diagnostics Findings: Na+/ K+: 137/3.7 Glucose: 175  Unremarkable CBC PCT: negative <0.10  Lactic acid: 3.4 UDS+ Cocaine COVID PCR: Negative,  troponin: 65  BNP: 1004.9 ABG: pO2 180; pCO2 64; pH 7.48;  HCO3 47.7, %O2 Sat 99.7.  CXR> CTH> CTA Chest> see full report below Medication administered in the ED: see code sheet Disposition:ICU  Past Medical History  CHF COPD? TOBACCO ABUSE  Significant Hospital Events   12/26: Admit to ICU with acute hypoxic hypercapnic respiratory failure in the setting of AECOPD and pneumonia.  Suffered inpatient PEA cardiac arrest x 2  Consults:  Cardiology  Procedures:  12/26: Intubation  Significant Diagnostic Tests:  12/26: Chest Xray> IMPRESSION: Endotracheal tube 7 cm above the carina. No acute cardiopulmonary disease.  IMPRESSION: Emphysematous changes in the lungs. No pneumothorax. No evidence of pulmonary collapse or consolidation.  12/26:Noncontrast CT head> IMPRESSION: No acute intracranial abnormality.  12/26:CTA Chest, abdomen and pelvis> IMPRESSION: Airspace opacity in the left lower lobe concerning for early infiltrate/pneumonia. Moderate to severe emphysema. Endotracheal tube tip in the midtrachea. No evidence of pulmonary embolus. Aortic Atherosclerosis (ICD10-I70.0) and Emphysema (ICD10-J43.9).  Interim History / Subjective:    -Is intubated and sedated  Micro Data:  12/26: SARS-CoV-2 PCR>  12/26: Influenza PCR>  12/26: Blood culture x2> 12/26: MRSA PCR>>  12/26: Strep pneumo urinary antigen> 12/26: Legionella urinary antigen>  Antimicrobials:  Azithromycin 12/26> Ceftriaxone  12/26>   OBJECTIVE  Blood pressure (!) 186/116, pulse (!) 117, temperature (!) 96.8 F (36 C), resp. rate 18, height 5\' 10"  (1.778 m), weight 68 kg, SpO2 90%.    FiO2 (%):  [40 %] 40 %  No intake or output data  in the 24 hours ending 01/26/23 0141 Filed Weights   01/26/23 0034  Weight: 68 kg   Physical Examination  GENERAL: 62 year-old critically ill patient lying in the bed intubated and sedated EYES: PEERLA. No scleral icterus. Extraocular muscles intact.  HEENT: Head atraumatic, normocephalic. Oropharynx and nasopharynx clear.  NECK:  No JVD, supple  LUNGS: Normal breath sounds bilaterally.  No use of accessory muscles of respiration.  CARDIOVASCULAR: S1, S2 normal. No murmurs, rubs, or gallops.  ABDOMEN: Soft, NTND EXTREMITIES: Bilateral lower extremities trace pitting edema.  Capillary refill < 3 seconds in all extremities. Pulses palpable distally. NEUROLOGIC: The patient is intubated and sedated . No focal neurological deficit appreciated. Cranial nerves are intact.  SKIN: No obvious rash, lesion, or ulcer. Warm to touch Labs/imaging that I havepersonally reviewed  (right click and "Reselect all SmartList Selections" daily)     Labs   CBC: Recent Labs  Lab 01/26/23 0010  WBC 9.3  NEUTROABS 4.9  HGB 14.4  HCT 46.4  MCV 91.9  PLT 366    Basic Metabolic Panel: Recent Labs  Lab 01/26/23 0010  NA 137  K 3.7  CL 100  CO2 24  GLUCOSE 175*  BUN 9  CREATININE 1.21  CALCIUM 9.2  MG 1.9   GFR: Estimated Creatinine Clearance: 59.3 mL/min (by C-G formula based on SCr of 1.21 mg/dL). Recent Labs  Lab 01/26/23 0010  WBC 9.3  LATICACIDVEN 3.4*    Liver Function Tests: Recent Labs  Lab 01/26/23 0010  AST 23  ALT 12  ALKPHOS 103  BILITOT 0.4  PROT 8.6*  ALBUMIN 4.1   No results for input(s): "LIPASE", "AMYLASE" in the last 168 hours. No results for input(s): "AMMONIA" in the last 168 hours.  ABG    Component Value Date/Time   PHART 7.48 (H) 01/26/2023 0010   PCO2ART 64 (H) 01/26/2023 0010   PO2ART 180 (H) 01/26/2023 0010   HCO3 47.7 (H) 01/26/2023 0010   O2SAT 99.7 01/26/2023 0010     Coagulation Profile: No results for input(s): "INR", "PROTIME" in the  last 168 hours.  Cardiac Enzymes: No results for input(s): "CKTOTAL", "CKMB", "CKMBINDEX", "TROPONINI" in the last 168 hours.  HbA1C: No results found for: "HGBA1C"  CBG: Recent Labs  Lab 01/26/23 0031  GLUCAP 148*    Review of Systems:   Unable to be obtained secondary to the patient's intubated and sedated status.   Past Medical History  He,  has no past medical history on file.   Surgical History      Social History      Family History   His family history is not on file.   Allergies Not on File   Home Medications  Prior to Admission medications   Not on File   Active [facility] Problem list   See systems below  Assessment & Plan:  #PEA Cardiac Arrest  #NSTEMI suspect coronary vasospasm in the setting of cocaine use Hx of ?CHF, Ischemic Cardiomyopathy Not candidate for intervention per STEMI oncall MD, see separate note -Normothermia protocol -Vasopressors as needed to maintain MAP goal  -Trend HS Troponin until peaked  -Nitroglycerine gtt -Aspirin  81 mg/day -Heparin gtt per ACS protocol -Atorvastatin 80mg  PO  daily -Cardiology consult -Obtain 2D Echo   #Acute on Chronic Hypoxic and Hypercapnic Respiratory Failure #Acute Exacerbation of COPD #Pneumonia   Hx of COPD with Emphysema overlap, tobacco use.  CTA chest negative for PE but consistent with an infectious process.   -full mechanical support 6-8cc/kg/Vt  -titrate FiO2, PEEP to maintain O2 sat >90%  -Lung protective ventilation  -PRN Chest X-ray & ABG -PRN and scheduled bronchodilators -Start systemic steroid -SAT/SBT when appropriate  -prn fentanyl, prop for RASS -1    #Sepsis due to Pneumonia -F/u cultures, trend lactic/ PCT -Monitor WBC/ fever curve -IV antibiotics: Ceftriaxone and Azithromycin -IVF hydration as needed -Obtain tracheal aspirate, check strep pneumo and Legionella antigen -Pressors for MAP goal >65 -Strict I/O's    #Acute Metabolic Encephalopathy #Hx of  Polysubstance Abuse (Cocaine abuse, smoking) -CTH negative -UDS +Cocaine -Ketamine and fentanyl gtt for sedation/withdrawal and pain -Versed 1- 2 mg q1h prn anxiety/restlessness   Best practice:  Diet:  NPO Pain/Anxiety/Delirium protocol (if indicated): Yes (RASS goal -1) VAP protocol (if indicated): Yes DVT prophylaxis: Systemic AC GI prophylaxis: H2B Glucose control:  SSI Yes Central venous access:  N/A Arterial line:  N/A Foley:  Yes, and it is still needed Mobility:  bed rest  PT consulted: N/A Last date of multidisciplinary goals of care discussion [12/6] Code Status:  full code Disposition: ICU   = Goals of Care = Code Status Order: FULL   Wishes to pursue full aggressive treatment and intervention options, including CPR and intubation, but goals of care will be addressed on going with family if that should become necessary.  Critical care time: 45 minutes        Webb Silversmith DNP, CCRN, FNP-C, AGACNP-BC Acute Care & Family Nurse Practitioner [facility] PCCM on call pager [phone]

## 2023-01-26 NOTE — ED Notes (Signed)
Aram Beecham called and updated on patient events. Patient rolling to CT at this time with Elmarie Shiley, RN and NT Zach. Propofol and fentanyl turned off since 2nd code, patient is not having movement at this time.

## 2023-01-26 NOTE — ED Notes (Signed)
Propofol and Fentanyl paused due to low BP, HR change, Katrinka Blazing, MD to bedside. IVF bolus started.

## 2023-01-26 NOTE — Progress Notes (Signed)
This RN did rate dose verify for patient for 1900. Current running precedex order weight changed on pump to match order. Please see MAR for accuracy.

## 2023-01-26 NOTE — ED Provider Notes (Signed)
[facility] Provider Note    Event Date/Time   First MD Initiated Contact with Patient 01/26/23 0004     (approximate)   History   Respiratory Distress   HPI  Scott Roberson is a 62 y.o. male who presents to the ED for evaluation of Respiratory Distress   Patient presents to the ED from a local group home/boardinghouse by EMS for evaluation of respiratory failure.  Majority of history is provided by EMS as patient is obtunded and encephalopathic on arrival, unable to provide any history.  Reportedly called out for shortness of breath for "couple hours."  They found him tripoding , sats in the 40s on room air.   Physical Exam   Triage Vital Signs: ED Triage Vitals  Encounter Vitals Group     BP      Systolic BP Percentile      Diastolic BP Percentile      Pulse      Resp      Temp      Temp src      SpO2      Weight      Height      Head Circumference      Peak Flow      Pain Score      Pain Loc      Pain Education      Exclude from Growth Chart     Most recent vital signs: Vitals:   01/26/23 0046 01/26/23 0047  BP:    Pulse: (!) 102 (!) 101  Resp: 20 20  Temp: 97.7 F (36.5 C) 97.7 F (36.5 C)  SpO2: 100% 100%    General: Frail and thin, pink puffer morphology, fairly slow and agonal respirations.  Obtunded, roving eyes .  Does not engage with me with noxious or verbal stimuli CV:  Good peripheral perfusion.  Sinus tachycardia, hypertensive Resp:  Very poor airflow Abd:  No distention.  Soft MSK:  No deformity noted.  Lower extremity edema bilaterally Neuro:  No focal deficits appreciated.  No clonus, rigidity or seizure activity Other:     ED Results / Procedures / Treatments   Labs (all labs ordered are listed, but only abnormal results are displayed) Labs Reviewed  COMPREHENSIVE METABOLIC PANEL - Abnormal; Notable for the following components:      Result Value   Glucose, Bld 175 (*)    Total Protein 8.6 (*)    All  other components within normal limits  BRAIN NATRIURETIC PEPTIDE - Abnormal; Notable for the following components:   B Natriuretic Peptide 1,004.9 (*)    All other components within normal limits  BLOOD GAS, ARTERIAL - Abnormal; Notable for the following components:   pH, Arterial 7.48 (*)    pCO2 arterial 64 (*)    pO2, Arterial 180 (*)    Bicarbonate 47.7 (*)    Acid-Base Excess 20.3 (*)    All other components within normal limits  URINALYSIS, ROUTINE W REFLEX MICROSCOPIC - Abnormal; Notable for the following components:   Color, Urine YELLOW (*)    APPearance HAZY (*)    Protein, ur 100 (*)    All other components within normal limits  LACTIC ACID, PLASMA - Abnormal; Notable for the following components:   Lactic Acid, Venous 3.4 (*)    All other components within normal limits  URINE DRUG SCREEN, QUALITATIVE ([facility] ONLY) - Abnormal; Notable for the following components:   Cocaine Metabolite,Ur Malone POSITIVE (*)  All other components within normal limits  CBG MONITORING, ED - Abnormal; Notable for the following components:   Glucose-Capillary 148 (*)    All other components within normal limits  TROPONIN I (HIGH SENSITIVITY) - Abnormal; Notable for the following components:   Troponin I (High Sensitivity) 65 (*)    All other components within normal limits  CULTURE, BLOOD (ROUTINE X 2)  CULTURE, BLOOD (ROUTINE X 2)  RESP PANEL BY RT-PCR (RSV, FLU A&B, COVID)  RVPGX2  CULTURE, RESPIRATORY W GRAM STAIN  CBC WITH DIFFERENTIAL/PLATELET  MAGNESIUM  LACTIC ACID, PLASMA  PROCALCITONIN  HIV ANTIBODY (ROUTINE TESTING W REFLEX)  LEGIONELLA PNEUMOPHILA SEROGP 1 UR AG  STREP PNEUMONIAE URINARY ANTIGEN  PHOSPHORUS  MAGNESIUM  BASIC METABOLIC PANEL  CBC  BLOOD GAS, ARTERIAL    EKG First EKG with a sinus tachycardia, rate of 132 bpm.  Normal axis and intervals.  Nonspecific ST changes without clear STEMI.  Post ROSC EKG with undetermined rhythm concerning for V. tach versus STEMI  versus A-fib with aberrancy, rate of 129 bpm  Last EKG with a sinus rhythm with a rate of 93 bpm.  Nonspecific changes but no STEMI.  RADIOLOGY CXR interpreted by me without evidence of acute cardiopulmonary pathology.  Official radiology report(s): DG Chest Portable 1 View Result Date: 01/26/2023 CLINICAL DATA:  Intubated, respiratory failure EXAM: PORTABLE CHEST 1 VIEW COMPARISON:  None available FINDINGS: Endotracheal tube is 7 cm above the carina. Heart is normal size. Aortic atherosclerosis. No confluent airspace opacities or effusions. No acute bony abnormality. IMPRESSION: Endotracheal tube 7 cm above the carina. No acute cardiopulmonary disease. Electronically Signed   By: Charlett Nose M.D.   On: 01/26/2023 00:38    PROCEDURES and INTERVENTIONS:  .Critical Care  Performed by: Delton Prairie, MD Authorized by: Delton Prairie, MD   Critical care provider statement:    Critical care time (minutes):  75   Critical care was time spent personally by me on the following activities:  Development of treatment plan with patient or surrogate, discussions with consultants, evaluation of patient's response to treatment, examination of patient, ordering and review of laboratory studies, ordering and review of radiographic studies, ordering and performing treatments and interventions, pulse oximetry, re-evaluation of patient's condition and review of old charts Procedure Name: Intubation Date/Time: 01/26/2023 1:25 AM  Performed by: Delton Prairie, MDPre-anesthesia Checklist: Patient identified, Patient being monitored, Emergency Drugs available, Timeout performed and Suction available Oxygen Delivery Method: Non-rebreather mask Preoxygenation: Pre-oxygenation with 100% oxygen Induction Type: Rapid sequence Ventilation: Mask ventilation without difficulty Laryngoscope Size: Glidescope and 3 Tube size: 7.5 mm Airway Equipment and Method: Rigid stylet Placement Confirmation: ETT inserted through  vocal cords under direct vision, CO2 detector and Breath sounds checked- equal and bilateral Secured at: 23 cm Tube secured with: ETT holder    .1-3 Lead EKG Interpretation  Performed by: Delton Prairie, MD Authorized by: Delton Prairie, MD     Interpretation: abnormal     ECG rate:  110   ECG rate assessment: tachycardic     Rhythm: sinus tachycardia     Ectopy: none     Conduction: normal     Medications  fentaNYL in NS (21mcg/ml) infusion-PREMIX (0 mcg/hr Intravenous Stopped 01/26/23 0052)  docusate sodium (COLACE) capsule 100 mg (has no administration in time range)  polyethylene glycol (MIRALAX / GLYCOLAX) packet 17 g (has no administration in time range)  heparin injection 5,000 Units (has no administration in time range)  famotidine (PEPCID) tablet 20  mg (has no administration in time range)  ipratropium-albuterol (DUONEB) 0.5-2.5 (3) MG/3ML nebulizer solution 3 mL (has no administration in time range)  ipratropium-albuterol (DUONEB) 0.5-2.5 (3) MG/3ML nebulizer solution 3 mL (has no administration in time range)  EPINEPHrine (ADRENALIN) 1 MG/10ML injection (1 mg Intravenous Given 01/26/23 0030)  ketamine (KETALAR) adult IV infusion 1000mg /174mL (10 mg/mL-Premix) (has no administration in time range)  etomidate (AMIDATE) injection 20 mg (20 mg Intravenous Given 01/26/23 0012)  rocuronium (ZEMURON) injection 100 mg (100 mg Intravenous Given 01/26/23 0013)  fentaNYL (SUBLIMAZE) bolus via infusion 100 mcg (100 mcg Intravenous Bolus from Bag 01/26/23 0013)  lactated ringers bolus 1,000 mL (1,000 mLs Intravenous New Bag/Given 01/26/23 0030)     IMPRESSION / MDM / ASSESSMENT AND PLAN / ED COURSE  I reviewed the triage vital signs and the nursing notes.  Differential diagnosis includes, but is not limited to, ACS, PTX, PNA, muscle strain/spasm, PE, dissection, anxiety, pleural effusion  {Patient presents with symptoms of an acute illness or injury that is potentially  life-threatening.  Patient presents from a local boardinghouse in respiratory distress requiring intubation and ICU admission.  Hypoxic and hypercapnic.  Clear CXR with COPD changes.  Normal CBC and electrolytes.  First troponin mildly elevated, elevated BNP.  Positive for cocaine metabolites on UDS.  As below, he does unfortunately code 2 separate times briefly.  We discussed with cardiology due to a post ROSC EKG with some concerning ischemic changes, but STEMI was canceled.   Awaiting cross-sectional imaging to assess for ICH or PE, ICU agrees to follow-up on these images.  Clinical Course as of 01/26/23 0128  Thu Jan 26, 2023  0016 intubated [DS]  0020 I consult with ICU NP, elizabeth, for admission [DS]  0020 Reassessed. Remains stable. SBP 150s [DS]  0043 Shortly after this, I am told about hypotensive blood pressures.  Start fluids and cut back on sedation.  I immediately reassessed the patient and note a waning pulse, we start chest compressions and do 1 round of CPR and get the patient back on first pulse check.  Subsequently hypertensive, tachycardic with many PVCs and undetermined rhythm.  More organized rhythm with calcium and bicarb.  Glucose was normal.  Suspicion for metabolic derangement such as hyperkalemia, but awaiting metabolic panel.  ICU NP at the bedside, they have a ready bed. [DS]  0105 Another arrest prior to going up to the ICU as were waiting on transport, respiratory to facilitate safe transport to the ICU.  Post ROSC EKG with concerning ischemic changes that are now more clearly STEMI than previous EKGs that had ischemic changes.  We called cardiology and call a code STEMI. [DS]  0118 Cardiology.  Another EKG once rhythm seems more organized does not show STEMI.  They will cancel STEMI right now.  We will head to CT on the way up to the ICU to assess for PE as well as CT head to look for ICH [DS]    Clinical Course User Index [DS] Delton Prairie, MD     FINAL  CLINICAL IMPRESSION(S) / ED DIAGNOSES   Final diagnoses:  Acute respiratory failure with hypoxia and hypercapnia (HCC)     Rx / DC Orders   ED Discharge Orders     None        Note:  This document was prepared using Dragon voice recognition software and may include unintentional dictation errors.   Delton Prairie, MD 01/26/23 [phone]

## 2023-01-26 NOTE — Progress Notes (Signed)
RT LATE ENTRY: Patient intubated in er by MD due to respiratory failure. Placed patient on initial vent settings in [facility] mode. See flowsheet. ABG obtained.  Patient coded shortly afterwards. ACLS protocols initiated. ICU provider at bedside. Notable high peak pressures. Placed patient in PCV mode per NP. -see flowsheet. Xray obtained. Patient transferred to CT/ICU.

## 2023-01-26 NOTE — ED Notes (Signed)
Ca+ x1

## 2023-01-26 NOTE — Progress Notes (Signed)
ANTICOAGULATION CONSULT NOTE  Pharmacy Consult for heparin infusion Indication: ACS/STEMI  Not on File  Patient Measurements: Height: 5\' 10"  (177.8 cm) Weight: 68 kg (150 lb) IBW/kg (Calculated) : 73 Heparin Dosing Weight: 68 kg  Vital Signs: Temp: 96.8 F (36 C) (12/26 0146) Temp Source: Bladder (12/26 0146) BP: 189/101 (12/26 0146) Pulse Rate: 116 (12/26 0146)  Labs: Recent Labs    01/26/23 0010  HGB 14.4  HCT 46.4  PLT 366  CREATININE 1.21  TROPONINIHS 65*    Estimated Creatinine Clearance: 59.3 mL/min (by C-G formula based on SCr of 1.21 mg/dL).   Medical History: No past medical history on file.  Assessment: Pt is a 62 yo male presenting to ED in resp distress found with elevated troponin I and BNP.  Goal of Therapy:  Heparin level 0.3-0.7 units/ml Monitor platelets by anticoagulation protocol: Yes   Plan:  Bolus 4000 units x 1 Start heparin infusion at 900 units/hr Will check HL in 6 hr after start of infusion CBC daily while on heparin  Otelia Sergeant, PharmD, [facility] 01/26/2023 1:54 AM

## 2023-01-26 NOTE — Progress Notes (Signed)
I responded to a code STEMI on this 62 year old gentleman who was brought by [facility] from a boardinghouse with complaints of respiratory distress and severe hypoxia.  He was minimally responsive on arrival and was intubated.  He developed hypotension with propofol and fentanyl then went into PEA.  He was resuscitated successfully.  EKG showed intermittent wide-complex rhythm.  However, after stabilizing him, the EKG showed sinus rhythm with no significant ST changes.  Suspect EKG changes due to metabolic abnormalities.  Most recent EKG does not meet criteria for STEMI.  I canceled code STEMI. Continue supportive care and serial troponins.  The next troponin is expected to be somewhat elevated due to CPR.

## 2023-01-26 NOTE — Progress Notes (Signed)
ANTICOAGULATION CONSULT NOTE  Pharmacy Consult for heparin infusion Indication: ACS/STEMI  Not on File  Patient Measurements: Height: 5\' 10"  (177.8 cm) Weight: 56.8 kg (125 lb 3.5 oz) IBW/kg (Calculated) : 73 Heparin Dosing Weight: 68 kg  Vital Signs: Temp: 101.3 F (38.5 C) (12/26 1311) Temp Source: Bladder (12/26 0500) BP: 112/69 (12/26 1311) Pulse Rate: 65 (12/26 1311)  Labs: Recent Labs    01/26/23 0010 01/26/23 0255 01/26/23 0900 01/26/23 1003 01/26/23 1225  HGB 14.4 12.9*  --   --   --   HCT 46.4 41.4  --   --   --   PLT 366 311  --   --   --   APTT  --   --  200*  --   --   LABPROT  --   --  18.2*  --   --   INR  --   --  1.5*  --   --   HEPARINUNFRC  --   --  >1.10*  --   --   CREATININE 1.21 1.15  --   --   --   CKTOTAL  --   --  151  --   --   TROPONINIHS 65* 188* 870* 814* 642*    Estimated Creatinine Clearance: 52.1 mL/min (by C-G formula based on SCr of 1.15 mg/dL).   Medical History: No past medical history on file.  Assessment: Pt is a 62 yo male presenting to ED in resp distress found with elevated troponin I and BNP.  Goal of Therapy:  Heparin level 0.3-0.7 units/ml Monitor platelets by anticoagulation protocol: Yes  Date/Time: aPTT / HL: Rate / Comment: 12/26@0900  200 / >1.10 Supratherapeutic@900  units/hr   Plan:  Will hold heparin infusion for 1 hour and restart at 700 units/hr Will check aPTT in 6 hr after restart of infusion and HL daily Will use aPTT to guide dosing until both levels correlate, then will transition to HL dosing CBC daily while on heparin  Bettey Costa, PharmD Clinical Pharmacist 01/26/2023 2:17 PM

## 2023-01-27 DIAGNOSIS — F1410 Cocaine abuse, uncomplicated: Secondary | ICD-10-CM | POA: Diagnosis not present

## 2023-01-27 DIAGNOSIS — I252 Old myocardial infarction: Secondary | ICD-10-CM | POA: Diagnosis not present

## 2023-01-27 DIAGNOSIS — J9601 Acute respiratory failure with hypoxia: Secondary | ICD-10-CM | POA: Diagnosis not present

## 2023-01-27 DIAGNOSIS — I214 Non-ST elevation (NSTEMI) myocardial infarction: Secondary | ICD-10-CM | POA: Diagnosis not present

## 2023-01-27 DIAGNOSIS — J9621 Acute and chronic respiratory failure with hypoxia: Secondary | ICD-10-CM | POA: Diagnosis not present

## 2023-01-27 DIAGNOSIS — J441 Chronic obstructive pulmonary disease with (acute) exacerbation: Secondary | ICD-10-CM | POA: Diagnosis not present

## 2023-01-27 DIAGNOSIS — J9622 Acute and chronic respiratory failure with hypercapnia: Secondary | ICD-10-CM | POA: Diagnosis not present

## 2023-01-27 LAB — LEGIONELLA PNEUMOPHILA SEROGP 1 UR AG: L. pneumophila Serogp 1 Ur Ag: NEGATIVE

## 2023-01-27 LAB — CBC
HCT: 36.2 % — ABNORMAL LOW (ref 39.0–52.0)
Hemoglobin: 11.7 g/dL — ABNORMAL LOW (ref 13.0–17.0)
MCH: 29.1 pg (ref 26.0–34.0)
MCHC: 32.3 g/dL (ref 30.0–36.0)
MCV: 90 fL (ref 80.0–100.0)
Platelets: 181 10*3/uL (ref 150–400)
RBC: 4.02 MIL/uL — ABNORMAL LOW (ref 4.22–5.81)
RDW: 12.9 % (ref 11.5–15.5)
WBC: 8.5 10*3/uL (ref 4.0–10.5)
nRBC: 0 % (ref 0.0–0.2)

## 2023-01-27 LAB — BASIC METABOLIC PANEL
Anion gap: 12 (ref 5–15)
BUN: 23 mg/dL (ref 8–23)
CO2: 26 mmol/L (ref 22–32)
Calcium: 9.8 mg/dL (ref 8.9–10.3)
Chloride: 103 mmol/L (ref 98–111)
Creatinine, Ser: 2.07 mg/dL — ABNORMAL HIGH (ref 0.61–1.24)
GFR, Estimated: 35 mL/min — ABNORMAL LOW (ref >60)
Glucose, Bld: 122 mg/dL — ABNORMAL HIGH (ref 70–99)
Potassium: 5 mmol/L (ref 3.5–5.1)
Sodium: 141 mmol/L (ref 135–145)

## 2023-01-27 LAB — GLUCOSE, CAPILLARY
Glucose-Capillary: 102 mg/dL — ABNORMAL HIGH (ref 70–99)
Glucose-Capillary: 112 mg/dL — ABNORMAL HIGH (ref 70–99)
Glucose-Capillary: 134 mg/dL — ABNORMAL HIGH (ref 70–99)
Glucose-Capillary: 144 mg/dL — ABNORMAL HIGH (ref 70–99)
Glucose-Capillary: 156 mg/dL — ABNORMAL HIGH (ref 70–99)
Glucose-Capillary: 169 mg/dL — ABNORMAL HIGH (ref 70–99)

## 2023-01-27 LAB — APTT
aPTT: 67 s — ABNORMAL HIGH (ref 24–36)
aPTT: 60 s — ABNORMAL HIGH (ref 24–36)

## 2023-01-27 LAB — RESPIRATORY PANEL BY PCR

## 2023-01-27 LAB — HEPARIN LEVEL (UNFRACTIONATED): Heparin Unfractionated: >1.1 [IU]/mL — ABNORMAL HIGH (ref 0.30–0.70)

## 2023-01-27 LAB — MAGNESIUM: Magnesium: 2.3 mg/dL (ref 1.7–2.4)

## 2023-01-27 LAB — PHOSPHORUS: Phosphorus: 4 mg/dL (ref 2.5–4.6)

## 2023-01-27 MED ORDER — IPRATROPIUM-ALBUTEROL 0.5-2.5 (3) MG/3ML IN SOLN
3.0000 mL | Freq: Four times a day (QID) | RESPIRATORY_TRACT | Status: DC
  Administered 2023-01-27 – 2023-01-28 (×4): 3 mL via RESPIRATORY_TRACT
  Filled 2023-01-27 (×5): qty 3

## 2023-01-27 MED ORDER — ASPIRIN 81 MG PO CHEW
81.0000 mg | CHEWABLE_TABLET | Freq: Every day | ORAL | Status: DC
  Administered 2023-01-28 – 2023-01-31 (×4): 81 mg via ORAL
  Filled 2023-01-27 (×4): qty 1

## 2023-01-27 MED ORDER — LORAZEPAM 1 MG PO TABS: 1.0000 mg | ORAL_TABLET | ORAL | Status: AC | PRN

## 2023-01-27 MED ORDER — ATORVASTATIN CALCIUM 20 MG PO TABS
40.0000 mg | ORAL_TABLET | Freq: Every day | ORAL | Status: DC
  Administered 2023-01-27: 40 mg
  Filled 2023-01-27: qty 2

## 2023-01-27 MED ORDER — CLONIDINE HCL 0.1 MG PO TABS: 0.1000 mg | ORAL_TABLET | Freq: Three times a day (TID) | ORAL | Status: DC

## 2023-01-27 MED ORDER — NITROGLYCERIN 2 % TD OINT
1.0000 [in_us] | TOPICAL_OINTMENT | Freq: Four times a day (QID) | TRANSDERMAL | Status: DC
  Administered 2023-01-27 – 2023-01-29 (×8): 1 [in_us] via TOPICAL
  Filled 2023-01-27 (×7): qty 1

## 2023-01-27 MED ORDER — FAMOTIDINE 20 MG PO TABS
20.0000 mg | ORAL_TABLET | Freq: Two times a day (BID) | ORAL | Status: DC
  Administered 2023-01-27 – 2023-01-31 (×8): 20 mg via ORAL
  Filled 2023-01-27 (×8): qty 1

## 2023-01-27 MED ORDER — CHLORDIAZEPOXIDE HCL 5 MG PO CAPS
25.0000 mg | ORAL_CAPSULE | Freq: Three times a day (TID) | ORAL | Status: AC
  Administered 2023-01-28 – 2023-01-29 (×3): 25 mg via ORAL
  Filled 2023-01-27 (×3): qty 5

## 2023-01-27 MED ORDER — CLONIDINE HCL 0.1 MG PO TABS
0.1000 mg | ORAL_TABLET | Freq: Three times a day (TID) | ORAL | Status: DC
  Administered 2023-01-27 – 2023-01-30 (×9): 0.1 mg via ORAL
  Filled 2023-01-27 (×9): qty 1

## 2023-01-27 MED ORDER — CHLORDIAZEPOXIDE HCL 5 MG PO CAPS
25.0000 mg | ORAL_CAPSULE | Freq: Four times a day (QID) | ORAL | Status: AC
  Administered 2023-01-27 – 2023-01-28 (×4): 25 mg via ORAL
  Filled 2023-01-27 (×4): qty 5

## 2023-01-27 MED ORDER — HEPARIN BOLUS VIA INFUSION
1000.0000 [IU] | Freq: Once | INTRAVENOUS | Status: AC
  Administered 2023-01-27: 1000 [IU] via INTRAVENOUS
  Filled 2023-01-27: qty 1000

## 2023-01-27 MED ORDER — ADULT MULTIVITAMIN W/MINERALS CH
1.0000 | ORAL_TABLET | Freq: Every day | ORAL | Status: DC
  Administered 2023-01-27 – 2023-01-31 (×5): 1 via ORAL
  Filled 2023-01-27 (×5): qty 1

## 2023-01-27 MED ORDER — FENTANYL CITRATE PF 50 MCG/ML IJ SOSY
PREFILLED_SYRINGE | INTRAMUSCULAR | Status: AC
  Administered 2023-01-27: 50 ug via INTRAVENOUS
  Filled 2023-01-27: qty 1

## 2023-01-27 MED ORDER — ASPIRIN 81 MG PO CHEW
81.0000 mg | CHEWABLE_TABLET | Freq: Every day | ORAL | Status: DC
  Administered 2023-01-27: 81 mg
  Filled 2023-01-27: qty 1

## 2023-01-27 MED ORDER — ACETAMINOPHEN 325 MG PO TABS: 650.0000 mg | ORAL_TABLET | Freq: Four times a day (QID) | ORAL | Status: DC | PRN

## 2023-01-27 MED ORDER — CHLORDIAZEPOXIDE HCL 5 MG PO CAPS
25.0000 mg | ORAL_CAPSULE | ORAL | Status: AC
  Administered 2023-01-29 – 2023-01-30 (×2): 25 mg via ORAL
  Filled 2023-01-27 (×2): qty 5

## 2023-01-27 MED ORDER — LORAZEPAM 2 MG/ML IJ SOLN: 1.0000 mg | INTRAMUSCULAR | Status: AC | PRN

## 2023-01-27 MED ORDER — FENTANYL CITRATE PF 50 MCG/ML IJ SOSY
50.0000 ug | PREFILLED_SYRINGE | INTRAMUSCULAR | Status: DC | PRN
  Administered 2023-01-27 (×3): 50 ug via INTRAVENOUS
  Filled 2023-01-27 (×3): qty 1

## 2023-01-27 MED ORDER — AMLODIPINE BESYLATE 5 MG PO TABS
5.0000 mg | ORAL_TABLET | Freq: Every day | ORAL | Status: DC
Start: 2023-01-27 — End: 2023-01-28
  Administered 2023-01-27 – 2023-01-28 (×2): 5 mg via ORAL
  Filled 2023-01-27 (×2): qty 1

## 2023-01-27 MED ORDER — THIAMINE HCL 100 MG/ML IJ SOLN
100.0000 mg | Freq: Every day | INTRAMUSCULAR | Status: DC
Start: 2023-01-27 — End: 2023-01-31

## 2023-01-27 MED ORDER — CHLORDIAZEPOXIDE HCL 5 MG PO CAPS
25.0000 mg | ORAL_CAPSULE | Freq: Every day | ORAL | Status: AC
Start: 2023-01-30 — End: 2023-01-31
  Administered 2023-01-30: 25 mg via ORAL
  Filled 2023-01-27: qty 1

## 2023-01-27 MED ORDER — ATORVASTATIN CALCIUM 20 MG PO TABS
40.0000 mg | ORAL_TABLET | Freq: Every day | ORAL | Status: DC
Start: 2023-01-28 — End: 2023-01-31
  Administered 2023-01-28 – 2023-01-31 (×4): 40 mg via ORAL
  Filled 2023-01-27 (×4): qty 2

## 2023-01-27 MED ORDER — FOLIC ACID 1 MG PO TABS
1.0000 mg | ORAL_TABLET | Freq: Every day | ORAL | Status: DC
Start: 2023-01-27 — End: 2023-01-31
  Administered 2023-01-27 – 2023-01-31 (×5): 1 mg via ORAL
  Filled 2023-01-27 (×5): qty 1

## 2023-01-27 MED ORDER — THIAMINE MONONITRATE 100 MG PO TABS
100.0000 mg | ORAL_TABLET | Freq: Every day | ORAL | Status: DC
Start: 2023-01-27 — End: 2023-01-31
  Administered 2023-01-27 – 2023-01-31 (×5): 100 mg via ORAL
  Filled 2023-01-27 (×5): qty 1

## 2023-01-27 NOTE — Progress Notes (Signed)
Rounding Note    Patient Name: Scott Roberson Date of Encounter: 01/27/2023  [facility] Cardiologist: [facility]  Subjective   Changed to Precedex yesterday afternoon, improved blood pressure Arousable, trying to talk, mitts in place Remains intubated On heparin infusion, Precedex, broad-spectrum antibiotics, steroids Recent systolic pressure 170  Inpatient Medications    Scheduled Meds:  aspirin  81 mg Per Tube Daily   atorvastatin  40 mg Per Tube Daily   Chlorhexidine Gluconate Cloth  6 each Topical Daily   cloNIDine  0.1 mg Per Tube TID   famotidine  20 mg Per Tube BID   ipratropium-albuterol  3 mL Nebulization Q6H   methylPREDNISolone (SOLU-MEDROL) injection  40 mg Intravenous Daily   mouth rinse  15 mL Mouth Rinse Q2H   Continuous Infusions:  azithromycin Stopped (01/27/23 0538)   cefTRIAXone (ROCEPHIN)  IV Stopped (01/27/23 0431)   dexmedetomidine (PRECEDEX) IV infusion 1.1 mcg/kg/hr (01/27/23 1100)   fentaNYL infusion INTRAVENOUS Stopped (01/26/23 1220)   heparin 800 Units/hr (01/27/23 1100)   PRN Meds: acetaminophen, docusate sodium, fentaNYL (SUBLIMAZE) injection, ipratropium-albuterol, mouth rinse, polyethylene glycol   Vital Signs    Vitals:   01/27/23 0800 01/27/23 0900 01/27/23 1000 01/27/23 1100  BP: (!) 149/67 (!) 161/82 (!) 156/78 (!) 161/78  Pulse: 66 69 69 69  Resp: 14 14 14 14   Temp: 99.5 F (37.5 C) 99.3 F (37.4 C) 98.8 F (37.1 C)   TempSrc: Bladder     SpO2: 95% 95% 96% 93%  Weight:      Height:        Intake/Output Summary (Last 24 hours) at 01/27/2023 1202 Last data filed at 01/27/2023 1100 Gross per 24 hour  Intake 1702.05 ml  Output 790 ml  Net 912.05 ml      01/27/2023    4:00 AM 01/26/2023    1:46 AM 01/26/2023   12:34 AM  Last 3 Weights  Weight (lbs) 126 lb 12.2 oz 125 lb 3.5 oz 150 lb  Weight (kg) 57.5 kg 56.8 kg 68.04 kg      Telemetry    Normal sinus rhythm- Personally Reviewed  ECG     - Personally  Reviewed  Physical Exam   GEN: Intubated, arousable Neck: No JVD Cardiac: RRR, no murmurs, rubs, or gallops.  Respiratory: Clear to auscultation bilaterally. GI: Soft, nontender, non-distended  MS: No edema; No deformity. Neuro:  Nonfocal unable to complete full exam Psych: Sedated  Labs    High Sensitivity Troponin:   Recent Labs  Lab 01/26/23 0010 01/26/23 0255 01/26/23 0900 01/26/23 1003 01/26/23 1225  TROPONINIHS 65* 188* 870* 814* 642*     Chemistry Recent Labs  Lab 01/26/23 0010 01/26/23 0255 01/27/23 0355  NA 137 141 141  K 3.7 3.5 5.0  CL 100 102 103  CO2 24 26 26   GLUCOSE 175* 116* 122*  BUN 9 9 23   CREATININE 1.21 1.15 2.07*  CALCIUM 9.2 11.7* 9.8  MG 1.9 2.6* 2.3  PROT 8.6*  --   --   ALBUMIN 4.1  --   --   AST 23  --   --   ALT 12  --   --   ALKPHOS 103  --   --   BILITOT 0.4  --   --   GFRNONAA >60 >60 35*  ANIONGAP 13 13 12     Lipids No results for input(s): "CHOL", "TRIG", "HDL", "LABVLDL", "LDLCALC", "CHOLHDL" in the last 168 hours.  Hematology Recent Labs  Lab 01/26/23  0010 01/26/23 0255 01/27/23 0355  WBC 9.3 14.6* 8.5  RBC 5.05 4.53 4.02*  HGB 14.4 12.9* 11.7*  HCT 46.4 41.4 36.2*  MCV 91.9 91.4 90.0  MCH 28.5 28.5 29.1  MCHC 31.0 31.2 32.3  RDW 12.8 12.7 12.9  PLT 366 311 181   Thyroid No results for input(s): "TSH", "FREET4" in the last 168 hours.  BNP Recent Labs  Lab 01/26/23 0010  BNP 1,004.9*    DDimer No results for input(s): "DDIMER" in the last 168 hours.   Radiology    ECHOCARDIOGRAM COMPLETE Result Date: 01/26/2023    ECHOCARDIOGRAM REPORT   Patient Name:   Scott Roberson Date of Exam: 01/26/2023 Medical Rec #:  [id number]    Height:       70.0 in Accession #:    [id number]   Weight:       125.2 lb Date of Birth:  [birth year]    BSA:          1.711 m Patient Age:    62 years     BP:           186/116 mmHg Patient Gender: M            HR:           117 bpm. Exam Location:  [facility] Procedure: 2D Echo, Cardiac Doppler,  Color Doppler and Strain Analysis Indications:     Acute ischemic heart disease I24.9  History:         Patient has no prior history of Echocardiogram examinations. No                  past medical history on file.  Sonographer:     Cristela Blue Referring Phys:  [id number] Antonieta Iba Diagnosing Phys: Julien Nordmann MD  Sonographer Comments: Global longitudinal strain was attempted. IMPRESSIONS  1. Left ventricular ejection fraction, by estimation, is 35 to 40%. The left ventricle has moderately decreased function. The left ventricle demonstrates mild global hypokinesis with regional wall motion abnormalities (severe hypokinesis of the anterior, anteroseptal and apical region). The average left ventricular global longitudinal strain is -9.3 %.  2. Right ventricular systolic function is normal. The right ventricular size is normal.  3. The mitral valve is normal in structure. No evidence of mitral valve regurgitation. No evidence of mitral stenosis.  4. The aortic valve is normal in structure. Aortic valve regurgitation is mild to moderate. Aortic valve sclerosis/calcification is present, without any evidence of aortic stenosis.  5. The inferior vena cava is normal in size with greater than 50% respiratory variability, suggesting right atrial pressure of 3 mmHg. FINDINGS  Left Ventricle: Left ventricular ejection fraction, by estimation, is 35 to 40%. The left ventricle has moderately decreased function. The left ventricle demonstrates regional wall motion abnormalities. The average left ventricular global longitudinal strain is -9.3 %. The left ventricular internal cavity size was normal in size. There is no left ventricular hypertrophy. Left ventricular diastolic parameters are indeterminate. Right Ventricle: The right ventricular size is normal. No increase in right ventricular wall thickness. Right ventricular systolic function is normal. Left Atrium: Left atrial size was normal in size. Right Atrium: Right atrial  size was normal in size. Pericardium: There is no evidence of pericardial effusion. Mitral Valve: The mitral valve is normal in structure. No evidence of mitral valve regurgitation. No evidence of mitral valve stenosis. Tricuspid Valve: The tricuspid valve is normal in structure. Tricuspid valve regurgitation is not demonstrated. No evidence of tricuspid  stenosis. Aortic Valve: The aortic valve is normal in structure. Aortic valve regurgitation is mild to moderate. Aortic valve sclerosis/calcification is present, without any evidence of aortic stenosis. Aortic valve mean gradient measures 3.0 mmHg. Aortic valve peak gradient measures 5.2 mmHg. Aortic valve area, by VTI measures 2.97 cm. Pulmonic Valve: The pulmonic valve was normal in structure. Pulmonic valve regurgitation is not visualized. No evidence of pulmonic stenosis. Aorta: The aortic root is normal in size and structure. Venous: The inferior vena cava is normal in size with greater than 50% respiratory variability, suggesting right atrial pressure of 3 mmHg. IAS/Shunts: No atrial level shunt detected by color flow Doppler.  LEFT VENTRICLE PLAX 2D LVIDd:         5.00 cm      Diastology LVIDs:         3.50 cm      LV e' medial:    5.87 cm/s LV PW:         1.00 cm      LV E/e' medial:  12.9 LV IVS:        1.10 cm      LV e' lateral:   5.98 cm/s LVOT diam:     2.10 cm      LV E/e' lateral: 12.6 LV SV:         48 LV SV Index:   28           2D Longitudinal Strain LVOT Area:     3.46 cm     2D Strain GLS Avg:     -9.3 %  LV Volumes (MOD) LV vol d, MOD A2C: 134.0 ml LV vol d, MOD A4C: 115.0 ml LV vol s, MOD A2C: 63.3 ml LV vol s, MOD A4C: 63.8 ml LV SV MOD A2C:     70.7 ml LV SV MOD A4C:     115.0 ml LV SV MOD BP:      63.2 ml RIGHT VENTRICLE RV Basal diam:  3.50 cm RV Mid diam:    3.20 cm RV S prime:     11.40 cm/s TAPSE (M-mode): 1.9 cm LEFT ATRIUM           Index        RIGHT ATRIUM           Index LA diam:      3.10 cm 1.81 cm/m   RA Area:     12.70 cm LA  Vol (A2C): 38.6 ml 22.56 ml/m  RA Volume:   25.30 ml  14.79 ml/m LA Vol (A4C): 20.7 ml 12.10 ml/m  AORTIC VALVE AV Area (Vmax):    2.45 cm AV Area (Vmean):   2.73 cm AV Area (VTI):     2.97 cm AV Vmax:           114.00 cm/s AV Vmean:          69.500 cm/s AV VTI:            0.161 m AV Peak Grad:      5.2 mmHg AV Mean Grad:      3.0 mmHg LVOT Vmax:         80.60 cm/s LVOT Vmean:        54.700 cm/s LVOT VTI:          0.138 m LVOT/AV VTI ratio: 0.86  AORTA Ao Root diam: 3.00 cm MITRAL VALVE               TRICUSPID VALVE MV Area (PHT):  5.02 cm    TR Peak grad:   31.8 mmHg MV Decel Time: 151 msec    TR Vmax:        282.00 cm/s MV E velocity: 75.50 cm/s MV A velocity: 41.90 cm/s  SHUNTS MV E/A ratio:  1.80        Systemic VTI:  0.14 m                            Systemic Diam: 2.10 cm Julien Nordmann MD Electronically signed by Julien Nordmann MD Signature Date/Time: 01/26/2023/2:11:52 PM    Final    DG Abd 1 View Result Date: 01/26/2023 CLINICAL DATA:  [id number] Encounter for orogastric tube placement [id number] EXAM: ABDOMEN - 1 VIEW COMPARISON:  Same-day x-ray FINDINGS: Limited radiograph of the lower chest and upper abdomen was obtained for the purposes of enteric tube localization. Enteric tube is seen coursing below the diaphragm with distal tipterminating within the expected location of the gastric body. Side port positioned just below the level of the GE junction. IMPRESSION: Enteric tube with distal tip terminating within the expected location of the gastric body. Side port positioned just below the level of the GE junction. Recommend 5 cm of advancement. Electronically Signed   By: Duanne Guess D.O.   On: 01/26/2023 12:21   DG Chest Port 1 View Result Date: 01/26/2023 CLINICAL DATA:  Central line placement. EXAM: PORTABLE CHEST 1 VIEW COMPARISON:  Same day. FINDINGS: Interval placement of left internal jugular catheter with distal tip in expected position of the SVC. No pneumothorax. IMPRESSION: Interval  placement of left internal jugular catheter with distal tip in expected position of the SVC. Electronically Signed   By: Lupita Raider M.D.   On: 01/26/2023 08:53   DG Chest Port 1 View Result Date: 01/26/2023 CLINICAL DATA:  Respiratory failure. EXAM: PORTABLE CHEST 1 VIEW COMPARISON:  Same day. FINDINGS: The heart size and mediastinal contours are within normal limits. Endotracheal tube is in grossly good position. No acute pulmonary disease. The visualized skeletal structures are unremarkable. IMPRESSION: No active disease. Electronically Signed   By: Lupita Raider M.D.   On: 01/26/2023 07:52   CT HEAD WO CONTRAST ( ) Result Date: 01/26/2023 CLINICAL DATA:  Hypertensive, altered mental status EXAM: CT HEAD WITHOUT CONTRAST TECHNIQUE: Contiguous axial images were obtained from the base of the skull through the vertex without intravenous contrast. RADIATION DOSE REDUCTION: This exam was performed according to the departmental dose-optimization program which includes automated exposure control, adjustment of the mA and/or kV according to patient size and/or use of iterative reconstruction technique. COMPARISON:  None Available. FINDINGS: Brain: No acute intracranial abnormality. Specifically, no hemorrhage, hydrocephalus, mass lesion, acute infarction, or significant intracranial injury. Vascular: No hyperdense vessel or unexpected calcification. Skull: No acute calvarial abnormality. Sinuses/Orbits: No acute findings Other: None IMPRESSION: No acute intracranial abnormality. Electronically Signed   By: Charlett Nose M.D.   On: 01/26/2023 01:47   CT Angio Chest Pulmonary Embolism (PE) W or WO Contrast Result Date: 01/26/2023 CLINICAL DATA:  Pulmonary embolism (PE) suspected, high prob. Code STEMI. Respiratory distress, hypoxia. EXAM: CT ANGIOGRAPHY CHEST WITH CONTRAST TECHNIQUE: Multidetector CT imaging of the chest was performed using the standard protocol during bolus administration of intravenous  contrast. Multiplanar CT image reconstructions and MIPs were obtained to evaluate the vascular anatomy. RADIATION DOSE REDUCTION: This exam was performed according to the departmental dose-optimization program which includes automated exposure control, adjustment of  the mA and/or kV according to patient size and/or use of iterative reconstruction technique. CONTRAST:  75mL OMNIPAQUE IOHEXOL 350 MG/ML SOLN COMPARISON:  None Available. FINDINGS: Cardiovascular: No filling defects in the pulmonary arteries to suggest pulmonary emboli. Heart is normal size. Aorta is normal caliber. Scattered aortic calcifications. Mediastinum/Nodes: Endotracheal tube tip in the midtrachea. Layering mucus within the trachea. Thyroid and esophagus unremarkable. Lungs/Pleura: Moderate to advanced emphysema. Airspace opacity in the left lower lobe may reflect early infiltrate/pneumonia. No confluent opacity on the right. No effusions. Upper Abdomen: No acute findings Musculoskeletal: Chest wall soft tissues are unremarkable. No acute bony abnormality. Review of the MIP images confirms the above findings. IMPRESSION: Airspace opacity in the left lower lobe concerning for early infiltrate/pneumonia. Moderate to severe emphysema. Endotracheal tube tip in the midtrachea. No evidence of pulmonary embolus. Aortic Atherosclerosis (ICD10-I70.0) and Emphysema (ICD10-J43.9). Electronically Signed   By: Charlett Nose M.D.   On: 01/26/2023 01:46   DG Abdomen 1 View Result Date: 01/26/2023 CLINICAL DATA:  OG tube placement EXAM: ABDOMEN - 1 VIEW COMPARISON:  Chest 01/26/2023 FINDINGS: Limited field of view for tube placement verification. No enteric tube is demonstrated within the field of view. Visualized bowel gas pattern is normal. Lung bases are clear. IMPRESSION: No enteric tube is demonstrated within the field of view. Electronically Signed   By: Burman Nieves M.D.   On: 01/26/2023 01:33   DG Chest Portable 1 View Result Date:  01/26/2023 CLINICAL DATA:  Possible tension pneumothorax.  OG tube placement EXAM: PORTABLE CHEST 1 VIEW COMPARISON:  01/26/2023 FINDINGS: Endotracheal tube present with tip measuring 4.5 cm above the carina. Heart size and pulmonary vascularity are normal. Emphysematous changes in the lungs. Scattered fibrosis. No airspace disease or consolidation. No pleural effusion. No pneumothorax. Calcification of the aorta. IMPRESSION: Emphysematous changes in the lungs. No pneumothorax. No evidence of pulmonary collapse or consolidation. Electronically Signed   By: Burman Nieves M.D.   On: 01/26/2023 01:32   DG Chest Portable 1 View Result Date: 01/26/2023 CLINICAL DATA:  Intubated, respiratory failure EXAM: PORTABLE CHEST 1 VIEW COMPARISON:  None available FINDINGS: Endotracheal tube is 7 cm above the carina. Heart is normal size. Aortic atherosclerosis. No confluent airspace opacities or effusions. No acute bony abnormality. IMPRESSION: Endotracheal tube 7 cm above the carina. No acute cardiopulmonary disease. Electronically Signed   By: Charlett Nose M.D.   On: 01/26/2023 00:38    Cardiac Studies   Echo   1. Left ventricular ejection fraction, by estimation, is 35 to 40%. The  left ventricle has moderately decreased function. The left ventricle  demonstrates mild global hypokinesis with regional wall motion  abnormalities (severe hypokinesis of the  anterior, anteroseptal and apical region). The average left ventricular  global longitudinal strain is -9.3 %.   2. Right ventricular systolic function is normal. The right ventricular  size is normal.   3. The mitral valve is normal in structure. No evidence of mitral valve  regurgitation. No evidence of mitral stenosis.   4. The aortic valve is normal in structure. Aortic valve regurgitation is  mild to moderate. Aortic valve sclerosis/calcification is present, without  any evidence of aortic stenosis.   5. The inferior vena cava is normal in size  with greater than 50%  respiratory variability, suggesting right atrial pressure of 3 mmHg.   Patient Profile     Scott Roberson is a 62 y.o. male with a hx of self reported COPD and heart failure who is being  seen 01/26/2023 for the evaluation of elevated high-sensitivity troponin   Assessment & Plan  Non-STEMI -Elevated troponin suspected to be supply/demand mismatch in the setting of below In the setting of profound hypoxia saturations into the 40s, tachycardia with rates 130 up to 160 bpm in the emergency room, PEA arrest x 2 with chest compressions, urine positive for cocaine -Echo concerning for old anterior MI,Q waves on EKG anteriorly with ST abnormality inferior lateral leads concerning for ischemia -Given troponin 800, flat trending, most recent troponin trending downward,  No urgent catheterization needed given intubated, respiratory distress, climbing creatinine consistent with ATN  -Would continue heparin infusion, aspirin -Will start Nitropaste as blood pressure trending higher as sedation being weaned   Acute on chronic hypoxic and hypercapnic respiratory failure/COPD exacerbation/pneumonia -Intubated for airway protection, respiratory panel negative -Saturations 40% in the field, failed face mask, CPAP, finally intubated --FiO2 weaned down to 30% Moderate to severe emphysema on CT scan chest On steroids, broad-spectrum antibiotics   Sepsis due to pneumonia -Off pressors as blood pressure now running high On broad-spectrum antibiotics   Polysubstance abuse Positive for cocaine   Cardiomyopathy Ejection fraction 35 to 40%, 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

## 2023-01-27 NOTE — Consult Note (Signed)
PHARMACY CONSULT NOTE - ELECTROLYTES  Pharmacy Consult for Electrolyte Monitoring and Replacement   Recent Labs: Height: 5\' 10"  (177.8 cm) Weight: 57.5 kg (126 lb 12.2 oz) IBW/kg (Calculated) : 73 Estimated Creatinine Clearance: 29.3 mL/min (A) (by C-G formula based on SCr of 2.07 mg/dL (H)). Potassium (mmol/L)  Date Value  01/27/2023 5.0   Magnesium (mg/dL)  Date Value  28/41/3244 2.3   Calcium (mg/dL)  Date Value  02/02/7251 9.8   Albumin (g/dL)  Date Value  66/44/0347 4.1   Phosphorus (mg/dL)  Date Value  42/59/5638 4.0   Sodium (mmol/L)  Date Value  01/27/2023 141    Assessment  Scott Roberson is a 62 y.o. male presenting with respiratory distress.  Pharmacy has been consulted to monitor and replace electrolytes.  Diet: NPO, intubated Pertinent medications: N/A  Goal of Therapy: Electrolytes WNL  Plan:  No replacement currently indicated Check BMP, Mg, Phos with AM labs  Thank you for allowing pharmacy to be a part of this patient's care.  Bettey Costa, PharmD Clinical Pharmacist 01/27/2023 7:06 AM

## 2023-01-27 NOTE — Progress Notes (Signed)
NAME:  Scott Roberson, MRN:  [id number], DOB:  [birth year], LOS: 1 ADMISSION DATE:  01/26/2023  History of Present Illness:  62 year old male patient with unknown past medical history reportedly COPD who presented to the emergency department on 12/26 with respiratory distress.  Was found to be in hypoxic respiratory failure requiring intubation and mechanical ventilation on 1226.  Unfortunately course complicated by brief PEA arrest requiring 1 round of CPR and 1 epi to ROSC.  EKG with ST depression in the lateral leads.  Troponins peaked at 880.  U tox positive for cocaine.  Patient was also noted to be significantly wheezing and unable to move air and therefore systemic steroids and nebulizer treatment.   CTA PE protocol 12/26 ruled out PE.  However did show airspace opacity in the left lower lobe and moderate to severe emphysema.   Echocardiogram 12/26 with EF 35 to 40%, moderately decreased function.  Mild global hypokinesis with regional motion abnormalities severe hypokinesis of the anterior anteroseptal and apical region.    Pertinent  Medical History  As above  Significant Hospital Events: Including procedures, antibiotic start and stop dates in addition to other pertinent events   01/26/2023 Admitted for acute hypoxic resp failure intubated and mechanically ventilated. Course complicated by brief PEA arrest and profound shock.  01/27/2023 shock resolved extubated.  Found to have adenovirus positive.  Interim History / Subjective:  Awake alert following commands.  Reports that he drinks 6 beers per night.  Objective   Blood pressure (!) 171/81, pulse (!) 39, temperature 98.8 F (37.1 C), resp. rate 17, height 5\' 10"  (1.778 m), weight 57.5 kg, SpO2 98%. CVP:  [4 mmHg-14 mmHg] 9 mmHg  Vent Mode: PSV FiO2 (%):  [35 %-40 %] 35 % Set Rate:  [14 bmp] 14 bmp Vt Set:  [450 mL] 450 mL PEEP:  [5 cmH20-8 cmH20] 5 cmH20 Pressure Support:  [5 cmH20] 5 cmH20 Plateau Pressure:  [18 cmH20-19  cmH20] 18 cmH20   Intake/Output Summary (Last 24 hours) at 01/27/2023 1656 Last data filed at 01/27/2023 1648 Gross per 24 hour  Intake 1765.85 ml  Output 1180 ml  Net 585.85 ml   Filed Weights   01/26/23 0034 01/26/23 0146 01/27/23 0400  Weight: 68 kg 56.8 kg 57.5 kg    Examination: GEN extubated and following commands. HEENT supple neck, reactive pupils CVS normal S1, normal S2, regular rate and rhythm Lungs improved breath sounds bilaterally.  Faint wheezing heard throughout. Abdomen soft nontender nondistended positive bowel sounds Extremities warm well-perfused no edema   Labs and imaging were reviewed  Assessment & Plan:  Case of 62 year old male patient with an unknown past medical history presenting to [facility] hypoxic respiratory failure likely secondary to COPD exacerbation in the setting of adenovirus infection.  Course complicated by brief PEA arrest, new onset reduced EF and cocaine positive.  Currently extubated on 12/27.  Off pressors.  # Acute hypoxic respiratory failure secondary to # COPD exacerbation secondary to adenovirus infection. # NSTEMI type I versus type II with echocardiogram newly reduced EF 35 to 40% with regional wall motion abnormalities present. # Cocaine positive # Alcohol use disorder drinks 6 beers per night # AKI with increased creatinine 2.07 mg/dL from a baseline of 1.0 mg/dL.  Making urine continue to monitor.  CNS: CIWA protocol.  Clonidine 0.1 3 times daily.  Librium taper.  Multivitamin thiamine and folate. Lungs: Methylprednisolone 40 mg IV daily.  DuoNebs every 6 hours scheduled.  Ceftriaxone azithromycin for a total of  5 days. CVS: Amlodipine 5 daily.  Aspirin 81 mg daily.  Atorvastatin 40 mg daily.  Continue with heparin drip.  Appreciate cardiology recs. GI clear liquid diet advance as tolerated. Renal trend creatinine daily avoid nephrotoxic agents. Heme on heparin drip.  Best Practice (right click and "Reselect all SmartList  Selections" daily)   Diet/type: clear liquids DVT prophylaxis systemic heparin Pressure ulcer(s): N/A GI prophylaxis: N/A Lines: Central line Foley:  removal ordered  Code Status:  full code  Last date of multidisciplinary goals of care discussion [01/27/2023]  I spent 50 minutes caring for this patient today, including preparing to see the patient, obtaining a medical history , reviewing a separately obtained history, performing a medically appropriate examination and/or evaluation, counseling and educating the patient/family/caregiver, ordering medications, tests, or procedures, documenting clinical information in the electronic health record, and independently interpreting results (not separately reported/billed) and communicating results to the patient/family/caregiver  Janann Colonel, MD [facility] 01/27/2023 5:14 PM

## 2023-01-27 NOTE — Progress Notes (Signed)
ANTICOAGULATION CONSULT NOTE  Pharmacy Consult for heparin infusion Indication: ACS/STEMI  Not on File  Patient Measurements: Height: 5\' 10"  (177.8 cm) Weight: 57.5 kg (126 lb 12.2 oz) IBW/kg (Calculated) : 73 Heparin Dosing Weight: 68 kg  Vital Signs: Temp: 99.7 F (37.6 C) (12/27 0600) Temp Source: Bladder (12/27 0400) BP: 138/83 (12/27 0600) Pulse Rate: 70 (12/27 0600)  Labs: Recent Labs    01/26/23 0010 01/26/23 0255 01/26/23 0900 01/26/23 1003 01/26/23 1225 01/26/23 2146 01/27/23 0355  HGB 14.4 12.9*  --   --   --   --  11.7*  HCT 46.4 41.4  --   --   --   --  36.2*  PLT 366 311  --   --   --   --  181  APTT  --   --  200*  --   --  76* 67*  LABPROT  --   --  18.2*  --   --   --   --   INR  --   --  1.5*  --   --   --   --   HEPARINUNFRC  --   --  >1.10*  --   --   --  >1.10*  CREATININE 1.21 1.15  --   --   --   --  2.07*  CKTOTAL  --   --  151  --   --   --   --   TROPONINIHS 65* 188* 870* 814* 642*  --   --     Estimated Creatinine Clearance: 29.3 mL/min (A) (by C-G formula based on SCr of 2.07 mg/dL (H)).   Medical History: No past medical history on file.  Assessment: Pt is a 62 yo male presenting to ED in resp distress found with elevated troponin I and BNP.  Goal of Therapy:  Heparin level 0.3-0.7 units/ml Monitor platelets by anticoagulation protocol: Yes  Date/Time: aPTT / HL: Rate / Comment: 12/26@0900  200 / >1.10 Supratherapeutic@900   units/hr 12/26@2146    76                   Therapeutic x 1 at 700 units/hr 12/27 0355 67 / > 1.1 Barely therapeutic   Plan:  Will increase heparin infusion to 800 units/hr Will check aPTT at 1500 to reconfirm Will use aPTT to guide dosing until both levels correlate, then will transition to HL dosing HL and CBC daily while on heparin  Otelia Sergeant, PharmD, [facility] 01/27/2023 6:18 AM

## 2023-01-27 NOTE — Progress Notes (Signed)
ANTICOAGULATION CONSULT NOTE  Pharmacy Consult for heparin infusion Indication: ACS/STEMI  Not on File  Patient Measurements: Height: 5\' 10"  (177.8 cm) Weight: 57.5 kg (126 lb 12.2 oz) IBW/kg (Calculated) : 73 Heparin Dosing Weight: 68 kg  Vital Signs: Temp: 98.8 F (37.1 C) (12/27 1000) Temp Source: Bladder (12/27 0800) BP: 174/73 (12/27 1400) Pulse Rate: 69 (12/27 1400)  Labs: Recent Labs    01/26/23 0010 01/26/23 0255 01/26/23 0255 01/26/23 0900 01/26/23 1003 01/26/23 1225 01/26/23 2146 01/27/23 0355 01/27/23 1357  HGB 14.4 12.9*  --   --   --   --   --  11.7*  --   HCT 46.4 41.4  --   --   --   --   --  36.2*  --   PLT 366 311  --   --   --   --   --  181  --   APTT  --   --    < > 200*  --   --  76* 67* 60*  LABPROT  --   --   --  18.2*  --   --   --   --   --   INR  --   --   --  1.5*  --   --   --   --   --   HEPARINUNFRC  --   --   --  >1.10*  --   --   --  >1.10*  --   CREATININE 1.21 1.15  --   --   --   --   --  2.07*  --   CKTOTAL  --   --   --  151  --   --   --   --   --   TROPONINIHS 65* 188*  --  870* 814* 642*  --   --   --    < > = values in this interval not displayed.    Estimated Creatinine Clearance: 29.3 mL/min (A) (by C-G formula based on SCr of 2.07 mg/dL (H)).   Medical History: No past medical history on file.  Assessment: Pt is a 62 yo male presenting to ED in resp distress found with elevated troponin I and BNP.  Goal of Therapy:  Heparin level 0.3-0.7 units/ml Monitor platelets by anticoagulation protocol: Yes  Date/Time: aPTT / HL: Rate / Comment: 12/26@0900  200 / >1.10 Suprathera@900  units/hr 12/26@2146    76 / --          Therapeutic x 1 at 700 units/hr 12/27@0355  67 / > 1.1 Barely therapeutic 12/27@1357  60 / --  SUBtherpeutic@800  units/hr   Plan:  Heparin 1000 unit bolus x 1 Will increase heparin infusion to 950 units/hr Will check aPTT in 8 hours after rate change Will use aPTT to guide dosing until both levels  correlate, then will transition to HL dosing HL and CBC daily while on heparin  Bettey Costa, PharmD Clinical Pharmacist 01/27/2023 2:58 PM

## 2023-01-27 NOTE — Progress Notes (Signed)
Pt successfully extubated to 5L [place], Per order.

## 2023-01-28 DIAGNOSIS — J9622 Acute and chronic respiratory failure with hypercapnia: Secondary | ICD-10-CM | POA: Diagnosis not present

## 2023-01-28 DIAGNOSIS — J9621 Acute and chronic respiratory failure with hypoxia: Secondary | ICD-10-CM | POA: Diagnosis not present

## 2023-01-28 DIAGNOSIS — J449 Chronic obstructive pulmonary disease, unspecified: Secondary | ICD-10-CM | POA: Diagnosis not present

## 2023-01-28 DIAGNOSIS — J441 Chronic obstructive pulmonary disease with (acute) exacerbation: Secondary | ICD-10-CM | POA: Diagnosis not present

## 2023-01-28 DIAGNOSIS — F1410 Cocaine abuse, uncomplicated: Secondary | ICD-10-CM | POA: Diagnosis not present

## 2023-01-28 DIAGNOSIS — I214 Non-ST elevation (NSTEMI) myocardial infarction: Secondary | ICD-10-CM | POA: Diagnosis not present

## 2023-01-28 LAB — GLUCOSE, CAPILLARY
Glucose-Capillary: 145 mg/dL — ABNORMAL HIGH (ref 70–99)
Glucose-Capillary: 94 mg/dL (ref 70–99)
Glucose-Capillary: 96 mg/dL (ref 70–99)
Glucose-Capillary: 143 mg/dL — ABNORMAL HIGH (ref 70–99)
Glucose-Capillary: 155 mg/dL — ABNORMAL HIGH (ref 70–99)

## 2023-01-28 LAB — CBC
HCT: 32.2 % — ABNORMAL LOW (ref 39.0–52.0)
Hemoglobin: 10.3 g/dL — ABNORMAL LOW (ref 13.0–17.0)
MCH: 28.6 pg (ref 26.0–34.0)
MCHC: 32 g/dL (ref 30.0–36.0)
MCV: 89.4 fL (ref 80.0–100.0)
Platelets: 157 10*3/uL (ref 150–400)
RBC: 3.6 MIL/uL — ABNORMAL LOW (ref 4.22–5.81)
RDW: 12.6 % (ref 11.5–15.5)
WBC: 8.4 10*3/uL (ref 4.0–10.5)
nRBC: 0 % (ref 0.0–0.2)

## 2023-01-28 LAB — BASIC METABOLIC PANEL
Anion gap: 7 (ref 5–15)
BUN: 31 mg/dL — ABNORMAL HIGH (ref 8–23)
CO2: 29 mmol/L (ref 22–32)
Calcium: 8.8 mg/dL — ABNORMAL LOW (ref 8.9–10.3)
Chloride: 104 mmol/L (ref 98–111)
Creatinine, Ser: 1.35 mg/dL — ABNORMAL HIGH (ref 0.61–1.24)
GFR, Estimated: 59 mL/min — ABNORMAL LOW (ref >60)
Glucose, Bld: 143 mg/dL — ABNORMAL HIGH (ref 70–99)
Potassium: 4.3 mmol/L (ref 3.5–5.1)
Sodium: 140 mmol/L (ref 135–145)

## 2023-01-28 LAB — APTT
aPTT: 65 s — ABNORMAL HIGH (ref 24–36)
aPTT: 66 s — ABNORMAL HIGH (ref 24–36)
aPTT: 77 s — ABNORMAL HIGH (ref 24–36)

## 2023-01-28 LAB — MAGNESIUM: Magnesium: 2.2 mg/dL (ref 1.7–2.4)

## 2023-01-28 LAB — HEPARIN LEVEL (UNFRACTIONATED): Heparin Unfractionated: 0.87 [IU]/mL — ABNORMAL HIGH (ref 0.30–0.70)

## 2023-01-28 LAB — PHOSPHORUS: Phosphorus: 2.5 mg/dL (ref 2.5–4.6)

## 2023-01-28 MED ORDER — IPRATROPIUM-ALBUTEROL 0.5-2.5 (3) MG/3ML IN SOLN
3.0000 mL | Freq: Two times a day (BID) | RESPIRATORY_TRACT | Status: DC
  Administered 2023-01-29: 3 mL via RESPIRATORY_TRACT
  Filled 2023-01-28 (×2): qty 3

## 2023-01-28 MED ORDER — ORAL CARE MOUTH RINSE: 15.0000 mL | OROMUCOSAL | Status: DC | PRN

## 2023-01-28 MED ORDER — PREDNISONE 10 MG PO TABS
40.0000 mg | ORAL_TABLET | Freq: Every day | ORAL | Status: AC
  Administered 2023-01-28 – 2023-01-30 (×3): 40 mg via ORAL
  Filled 2023-01-28 (×4): qty 4

## 2023-01-28 MED ORDER — CARVEDILOL 6.25 MG PO TABS
6.2500 mg | ORAL_TABLET | Freq: Two times a day (BID) | ORAL | Status: DC
  Administered 2023-01-28 – 2023-01-31 (×5): 6.25 mg via ORAL
  Filled 2023-01-28 (×5): qty 1

## 2023-01-28 NOTE — Progress Notes (Addendum)
NAME:  Scott Roberson, MRN:  [id number], DOB:  1960/08/29, LOS: 2 ADMISSION DATE:  01/26/2023  History of Present Illness:  62 year old male patient with unknown past medical history reportedly COPD who presented to the emergency department on 12/26 with respiratory distress.  Was found to be in hypoxic respiratory failure requiring intubation and mechanical ventilation on 1226.  Unfortunately course complicated by brief PEA arrest requiring 1 round of CPR and 1 epi to ROSC.  EKG with ST depression in the lateral leads.  Troponins peaked at 880.  U tox positive for cocaine.  Patient was also noted to be significantly wheezing and unable to move air and therefore systemic steroids and nebulizer treatment.   CTA PE protocol 12/26 ruled out PE.  However did show airspace opacity in the left lower lobe and moderate to severe emphysema.   Echocardiogram 12/26 with EF 35 to 40%, moderately decreased function.  Mild global hypokinesis with regional motion abnormalities severe hypokinesis of the anterior anteroseptal and apical region.    Pertinent  Medical History  As above  Significant Hospital Events: Including procedures, antibiotic start and stop dates in addition to other pertinent events   01/26/2023 Admitted for acute hypoxic resp failure intubated and mechanically ventilated. Course complicated by brief PEA arrest and profound shock.  01/27/2023 shock resolved extubated.  Found to have adenovirus positive. 01/28/2023 - Feeling better today. On [place]. Complaining of 'burning eyes' otherwise no complaints.   Interim History / Subjective:  Awake alert following commands.  Reports that he drinks 6 beers per night.  Objective   Blood pressure 125/61, pulse 65, temperature 97.9 F (36.6 C), temperature source Oral, resp. rate 16, height 5\' 10"  (1.778 m), weight 57.6 kg, SpO2 99%. CVP:  [4 mmHg-17 mmHg] 16 mmHg  Vent Mode: PSV FiO2 (%):  [28 %-35 %] 28 % PEEP:  [5 cmH20] 5 cmH20 Pressure  Support:  [5 cmH20] 5 cmH20   Intake/Output Summary (Last 24 hours) at 01/28/2023 0908 Last data filed at 01/28/2023 0700 Gross per 24 hour  Intake 1273.03 ml  Output 1690 ml  Net -416.97 ml   Filed Weights   01/26/23 0146 01/27/23 0400 01/28/23 0232  Weight: 56.8 kg 57.5 kg 57.6 kg    Examination: GEN NAD, improved mentation  HEENT supple neck, reactive pupils CVS normal S1, normal S2, regular rate and rhythm Lungs improved breath sounds bilaterally.  Faint wheezing heard throughout. Abdomen soft nontender nondistended positive bowel sounds Extremities warm well-perfused no edema   Labs and imaging were reviewed Kidney function continues to improve.  Assessment & Plan:  Case of 62 year old male patient with an unknown past medical history presenting to [facility] hypoxic respiratory failure likely secondary to COPD exacerbation in the setting of adenovirus infection.  Course complicated by brief PEA arrest, new onset reduced EF and cocaine positive.  Currently extubated on 12/27.  Off pressors.  # Acute hypoxic respiratory failure secondary to # COPD exacerbation secondary to adenovirus infection. # NSTEMI type I versus type II with echocardiogram newly reduced EF 35 to 40% with regional wall motion abnormalities present. # Cocaine positive # Alcohol use disorder drinks 6 beers per night # AKI with increased creatinine 2.07 mg/dL from a baseline of 1.0 mg/dL.  Making urine continue to monitor.  CNS: CIWA protocol.  Clonidine 0.1 3 times daily.  Librium taper.  Multivitamin thiamine and folate. Lungs: switch Methylprednisolone to prednisone 40mg  daily and finish 5 days total.  DuoNebs every 6 hours scheduled.  Ceftriaxone azithromycin  for a total of 5 days. Will need standing ICS/LABA on discharge and outpatient pulmonary follow up.  CVS: Amlodipine 5 daily.  Aspirin 81 mg daily.  Atorvastatin 40 mg daily.  Continue with heparin drip. Nitropaste 1inch Q6H.  Appreciate cardiology  recs. GI clear liquid diet advance as tolerated. Renal trend creatinine daily avoid nephrotoxic agents. Heme on heparin drip.  Best Practice (right click and "Reselect all SmartList Selections" daily)   Diet/type: Cardiac diet DVT prophylaxis systemic heparin Pressure ulcer(s): N/A GI prophylaxis: N/A Lines: Central line DC today Foley:  removal ordered  Code Status:  full code  Transfer PCU [facility] service.   Last date of multidisciplinary goals of care discussion [01/28/2023]  I spent 50 minutes caring for this patient today, including preparing to see the patient, obtaining a medical history , reviewing a separately obtained history, performing a medically appropriate examination and/or evaluation, counseling and educating the patient/family/caregiver, ordering medications, tests, or procedures, documenting clinical information in the electronic health record, and independently interpreting results (not separately reported/billed) and communicating results to the patient/family/caregiver  Janann Colonel, MD [facility] 01/28/2023 9:08 AM

## 2023-01-28 NOTE — Consult Note (Signed)
PHARMACY CONSULT NOTE - ELECTROLYTES  Pharmacy Consult for Electrolyte Monitoring and Replacement   Recent Labs: Height: 5\' 10"  (177.8 cm) Weight: 57.6 kg (126 lb 15.8 oz) IBW/kg (Calculated) : 73 Estimated Creatinine Clearance: 45 mL/min (A) (by C-G formula based on SCr of 1.35 mg/dL (H)). Potassium (mmol/L)  Date Value  01/28/2023 4.3   Magnesium (mg/dL)  Date Value  76/16/0737 2.2   Calcium (mg/dL)  Date Value  10/62/6948 8.8 (L)   Albumin (g/dL)  Date Value  54/62/7035 4.1   Phosphorus (mg/dL)  Date Value  00/93/8182 2.5   Sodium (mmol/L)  Date Value  01/28/2023 140    Assessment  Scott Roberson is a 61 y.o. male presenting with respiratory distress.  Pharmacy has been consulted to monitor and replace electrolytes.  Goal of Therapy: Electrolytes WNL  Plan:  No replacement currently indicated Check BMP, Mg, Phos with AM labs  Thank you for allowing pharmacy to be a part of this patient's care.  Lowella Bandy, PharmD Clinical Pharmacist 01/28/2023 7:05 AM

## 2023-01-28 NOTE — Progress Notes (Signed)
ANTICOAGULATION CONSULT NOTE  Pharmacy Consult for heparin infusion Indication: ACS/STEMI  Not on File  Patient Measurements: Height: 5\' 10"  (177.8 cm) Weight: 57.5 kg (126 lb 12.2 oz) IBW/kg (Calculated) : 73 Heparin Dosing Weight: 68 kg  Vital Signs: Temp: 97.2 F (36.2 C) (12/27 2300) Temp Source: Axillary (12/27 2300) BP: 147/69 (12/28 0000) Pulse Rate: 62 (12/28 0000)  Labs: Recent Labs    01/26/23 0010 01/26/23 0255 01/26/23 0900 01/26/23 1003 01/26/23 1225 01/26/23 2146 01/27/23 0355 01/27/23 1357 01/27/23 2339  HGB 14.4 12.9*  --   --   --   --  11.7*  --   --   HCT 46.4 41.4  --   --   --   --  36.2*  --   --   PLT 366 311  --   --   --   --  181  --   --   APTT  --   --  200*  --   --    < > 67* 60* 77*  LABPROT  --   --  18.2*  --   --   --   --   --   --   INR  --   --  1.5*  --   --   --   --   --   --   HEPARINUNFRC  --   --  >1.10*  --   --   --  >1.10*  --   --   CREATININE 1.21 1.15  --   --   --   --  2.07*  --   --   CKTOTAL  --   --  151  --   --   --   --   --   --   TROPONINIHS 65* 188* 870* 814* 642*  --   --   --   --    < > = values in this interval not displayed.    Estimated Creatinine Clearance: 29.3 mL/min (A) (by C-G formula based on SCr of 2.07 mg/dL (H)).   Medical History: No past medical history on file.  Assessment: Pt is a 62 yo male presenting to ED in resp distress found with elevated troponin I and BNP.  Goal of Therapy:  Heparin level 0.3-0.7 units/ml Monitor platelets by anticoagulation protocol: Yes  Date/Time: aPTT / HL: Rate / Comment: 12/26@0900  200 / >1.10 Suprathera@900  units/hr 12/26@2146    76 / --          Therapeutic x 1 at 700 units/hr 12/27@0355  67 / > 1.1 Barely therapeutic 12/27@1357  60 / --  SUBtherpeutic@800  units/hr 12/27 2339 77 / --  Therapeutic x 1   Plan:  Continue heparin infusion at 950 units/hr Will recheck aPTT in w/ AM labs to confirm Will use aPTT to guide dosing until both levels  correlate, then will transition to HL dosing HL and CBC daily while on heparin  Otelia Sergeant, PharmD, [facility] 01/28/2023 12:57 AM

## 2023-01-28 NOTE — Progress Notes (Signed)
Patient Name: Scott Roberson Date of Encounter: 01/28/2023 [facility] Cardiologist: None  New consult completed by Dr. Mariah Milling  Interval Summary  .    Patient seen on a.m. rounds.  Denies any shortness of breath.  States that he has chronic chest discomfort that feels like a prickly sometimes stabbing pain to the right side of his chest.  He states that this discomfort has been longstanding and has not changed.  Was successfully extubated on 01/27/2023.  Only other complaints that he has this morning is some changes in his eyesight.  Vital Signs .    Vitals:   01/28/23 0500 01/28/23 0605 01/28/23 0637 01/28/23 0700  BP: 123/60 (!) 122/106  101/65  Pulse: (!) 58 79  65  Resp: 12 17  15   Temp:      TempSrc:      SpO2: 100% 91% 100% 98%  Weight:      Height:        Intake/Output Summary (Last 24 hours) at 01/28/2023 0746 Last data filed at 01/28/2023 0700 Gross per 24 hour  Intake 1323.07 ml  Output 1865 ml  Net -541.93 ml      01/28/2023    2:32 AM 01/27/2023    4:00 AM 01/26/2023    1:46 AM  Last 3 Weights  Weight (lbs) 126 lb 15.8 oz 126 lb 12.2 oz 125 lb 3.5 oz  Weight (kg) 57.6 kg 57.5 kg 56.8 kg      Telemetry/ECG    Sinus rhythm rate of 62 with continued to depression and T wave inversions noted- Personally Reviewed  Physical Exam .   GEN: No acute distress.   Neck: No JVD Cardiac: RRR, no murmurs, rubs, or gallops.  Respiratory: Clear to auscultation bilaterally. GI: Soft, nontender, non-distended  MS: No edema  Assessment & Plan .     Non-STEMI -Elevated troponins suspected to be supply/demand mismatch in the setting of profound hypoxia, tachycardia, PEA arrest x 2 with chest compressions as well as urine being positive for cocaine -Echo concerning for old anterior MI, Q waves on EKG anteriorly and ST abnormality in the inferior lateral leads concerning for ischemia -High-sensitivity troponin peaking at , flat trending -Continued on heparin  infusion for minimum of 48 hours and aspirin 81 mg daily and atorvastatin 40 mg daily -He is also on Nitropaste for better blood pressure control -Would benefit from further ischemic evaluation given elevated troponins, cardiomyopathy, and EKG changes, patient stated he had his last heart catheterization completed at the [place] at the age of 34, potential catheterization could be completed on Monday -No longer requires vasopressors -EKG as needed for pain or changes  Cardiomyopathy -BNP 1004.9 -Echocardiogram concerning for old anterior MI -Chest x-ray with no acute cardiopulmonary disease noted -Left heart catheterization recommended once respiratory status has improved -Euvolemic on exam -Daily weights, I's and O's, low-sodium diet  Sepsis due to pneumonia -Continued on antibiotic therapy -No longer requiring pressors -Received fluid resuscitation on arrival -Extubated 01/27/2023 -Continue management per PCCM  Acute on chronic hypoxic and hypercapnic respiratory failure/COPD exacerbation/pneumonia -Currently maintaining oxygen saturations on 2 L of O2 via nasal cannula -Denies any worsening shortness of breath -Supportive care -Continued on antibiotics and DuoNebs and steroid therapy -Continue management per PCCM  Hypertension -Blood pressure 125/61 -Better controlled with addition of oral medications -Continued on current regimen -Vital signs per unit protocol  Polysubstance abuse (cocaine and alcohol) -Urine drug screen positive for cocaine -Currently on CIWA protocol  For questions or  updates, please contact [facility] Please consult [email or web address] for contact info under        Signed, Kearie Mennen, NP

## 2023-01-28 NOTE — Plan of Care (Signed)
  Problem: Clinical Measurements: Goal: Ability to maintain clinical measurements within normal limits will improve Outcome: Progressing Goal: Respiratory complications will improve Outcome: Progressing Goal: Cardiovascular complication will be avoided Outcome: Progressing   Problem: Activity: Goal: Risk for activity intolerance will decrease Outcome: Progressing   Problem: Nutrition: Goal: Adequate nutrition will be maintained Outcome: Progressing   Problem: Coping: Goal: Level of anxiety will decrease Outcome: Progressing   Problem: Elimination: Goal: Will not experience complications related to bowel motility Outcome: Progressing   Problem: Safety: Goal: Ability to remain free from injury will improve Outcome: Progressing

## 2023-01-28 NOTE — Progress Notes (Signed)
ANTICOAGULATION CONSULT NOTE  Pharmacy Consult for heparin infusion Indication: ACS/STEMI  Not on File  Patient Measurements: Height: 5\' 10"  (177.8 cm) Weight: 57.6 kg (126 lb 15.8 oz) IBW/kg (Calculated) : 73 Heparin Dosing Weight: 68 kg  Vital Signs: Temp: 97.9 F (36.6 C) (12/28 0400) Temp Source: Oral (12/28 0400) BP: 122/106 (12/28 0605) Pulse Rate: 79 (12/28 0605)  Labs: Recent Labs    01/26/23 0255 01/26/23 0900 01/26/23 1003 01/26/23 1225 01/26/23 2146 01/27/23 0355 01/27/23 1357 01/27/23 2339 01/28/23 0507  HGB 12.9*  --   --   --   --  11.7*  --   --  10.3*  HCT 41.4  --   --   --   --  36.2*  --   --  32.2*  PLT 311  --   --   --   --  181  --   --  157  APTT  --  200*  --   --    < > 67* 60* 77* 65*  LABPROT  --  18.2*  --   --   --   --   --   --   --   INR  --  1.5*  --   --   --   --   --   --   --   HEPARINUNFRC  --  >1.10*  --   --   --  >1.10*  --   --  0.87*  CREATININE 1.15  --   --   --   --  2.07*  --   --  1.35*  CKTOTAL  --  151  --   --   --   --   --   --   --   TROPONINIHS 188* 870* 814* 642*  --   --   --   --   --    < > = values in this interval not displayed.    Estimated Creatinine Clearance: 45 mL/min (A) (by C-G formula based on SCr of 1.35 mg/dL (H)).   Medical History: No past medical history on file.  Assessment: Pt is a 62 yo male presenting to ED in resp distress found with elevated troponin I and BNP.  Goal of Therapy:  Heparin level 0.3-0.7 units/ml Monitor platelets by anticoagulation protocol: Yes  Date/Time: aPTT / HL: Rate / Comment: 12/26@0900  200 / >1.10 Suprathera@900  units/hr 12/26@2146    76 / --          Therapeutic x 1 at 700 units/hr 12/27@0355  67 / > 1.1 Barely therapeutic 12/27@1357  60 / --  SUBtherpeutic@800  units/hr 12/27 2339 77 / --  Therapeutic x 1 12/28 0507 65 / 0.87 Slightly subtherapeutic   Plan:  Increase heparin infusion at 1100 units/hr Will recheck aPTT in 6 hrs after rate  change Will use aPTT to guide dosing until both levels correlate, then will transition to HL dosing HL and CBC daily while on heparin  Otelia Sergeant, PharmD, [facility] 01/28/2023 6:29 AM

## 2023-01-28 NOTE — Progress Notes (Signed)
ANTICOAGULATION CONSULT NOTE  Pharmacy Consult for heparin infusion Indication: ACS/STEMI  Allergies  Allergen Reactions   Iodinated Contrast Media Hives and Rash    Patient Measurements: Height: 5\' 10"  (177.8 cm) Weight: 57.6 kg (126 lb 15.8 oz) IBW/kg (Calculated) : 73 Heparin Dosing Weight: 68 kg  Vital Signs: BP: 132/68 (12/28 1600) Pulse Rate: 85 (12/28 1600)  Labs: Recent Labs    01/26/23 0255 01/26/23 0900 01/26/23 1003 01/26/23 1225 01/26/23 2146 01/27/23 0355 01/27/23 1357 01/27/23 2339 01/28/23 0507 01/28/23 1559  HGB 12.9*  --   --   --   --  11.7*  --   --  10.3*  --   HCT 41.4  --   --   --   --  36.2*  --   --  32.2*  --   PLT 311  --   --   --   --  181  --   --  157  --   APTT  --  200*  --   --    < > 67*   < > 77* 65* 66*  LABPROT  --  18.2*  --   --   --   --   --   --   --   --   INR  --  1.5*  --   --   --   --   --   --   --   --   HEPARINUNFRC  --  >1.10*  --   --   --  >1.10*  --   --  0.87*  --   CREATININE 1.15  --   --   --   --  2.07*  --   --  1.35*  --   CKTOTAL  --  151  --   --   --   --   --   --   --   --   TROPONINIHS 188* 870* 814* 642*  --   --   --   --   --   --    < > = values in this interval not displayed.    Estimated Creatinine Clearance: 45 mL/min (A) (by C-G formula based on SCr of 1.35 mg/dL (H)).   Medical History: No past medical history on file.  Assessment: Pt is a 62 yo male presenting to ED in resp distress found with elevated troponin I and BNP.  Goal of Therapy:  Heparin level 0.3-0.7 units/ml Monitor platelets by anticoagulation protocol: Yes  Date/Time: aPTT / HL: Rate / Comment: 12/26@0900  200 / >1.10 Suprathera@900  units/hr 12/26@2146    76 / --          Therapeutic x 1 at 700 units/hr 12/27@0355  67 / > 1.1 Barely therapeutic 12/27@1357  60 / --  SUBtherpeutic@800  units/hr 12/27 2339 77 / --  Therapeutic x 1 12/28 0507 65 / 0.87 Slightly subtherapeutic 12/28 1559 66 / --  Therapeutic x1    Plan:  Continue heparin infusion at 1100 units/hr Will check confirmatory aPTT in 6 hrs Will use aPTT to guide dosing until both levels correlate, then will transition to HL dosing HL and CBC daily while on heparin  Thank you for involving pharmacy in this patient's care.   Rockwell Alexandria, PharmD Clinical Pharmacist 01/28/2023 4:52 PM

## 2023-01-28 NOTE — Plan of Care (Signed)
Up to [facility] today x 3-4 time for loose BM's and urination. Denies chest pain; does complain of left shoulder discomfort with certain movements from injury PTA. Heparin infusion continues. PTT therapeutic. Plan for heart cath on Monday. CIWA continues, orientation has improved throughout day, whereas was unable to recall the date, but is now oriented x 4. Continues to complain of blurry, burning eyes, but it has improved since this morning. Intermittently irritable, but overall calm and cooperative. Sisters were at bedside today.   Problem: Education: Goal: Knowledge of General Education information will improve Description: Including pain rating scale, medication(s)/side effects and non-pharmacologic comfort measures Outcome: Progressing   Problem: Health Behavior/Discharge Planning: Goal: Ability to manage health-related needs will improve Outcome: Progressing   Problem: Clinical Measurements: Goal: Ability to maintain clinical measurements within normal limits will improve Outcome: Progressing Goal: Will remain free from infection Outcome: Progressing Goal: Diagnostic test results will improve Outcome: Progressing Goal: Respiratory complications will improve Outcome: Progressing Goal: Cardiovascular complication will be avoided Outcome: Progressing   Problem: Activity: Goal: Risk for activity intolerance will decrease Outcome: Progressing   Problem: Nutrition: Goal: Adequate nutrition will be maintained Outcome: Progressing   Problem: Coping: Goal: Level of anxiety will decrease Outcome: Progressing   Problem: Elimination: Goal: Will not experience complications related to bowel motility Outcome: Progressing Goal: Will not experience complications related to urinary retention Outcome: Progressing   Problem: Pain Management: Goal: General experience of comfort will improve Outcome: Progressing   Problem: Safety: Goal: Ability to remain free from injury will  improve Outcome: Progressing   Problem: Skin Integrity: Goal: Risk for impaired skin integrity will decrease Outcome: Progressing   Problem: Activity: Goal: Ability to tolerate increased activity will improve Outcome: Progressing

## 2023-01-29 DIAGNOSIS — F1410 Cocaine abuse, uncomplicated: Secondary | ICD-10-CM | POA: Diagnosis not present

## 2023-01-29 DIAGNOSIS — I1A0 Resistant hypertension: Secondary | ICD-10-CM

## 2023-01-29 DIAGNOSIS — J9621 Acute and chronic respiratory failure with hypoxia: Secondary | ICD-10-CM

## 2023-01-29 DIAGNOSIS — J9622 Acute and chronic respiratory failure with hypercapnia: Secondary | ICD-10-CM | POA: Diagnosis not present

## 2023-01-29 DIAGNOSIS — I214 Non-ST elevation (NSTEMI) myocardial infarction: Secondary | ICD-10-CM | POA: Diagnosis not present

## 2023-01-29 LAB — CULTURE, RESPIRATORY W GRAM STAIN

## 2023-01-29 LAB — MAGNESIUM: Magnesium: 2 mg/dL (ref 1.7–2.4)

## 2023-01-29 LAB — APTT
aPTT: 76 s — ABNORMAL HIGH (ref 24–36)
aPTT: 77 s — ABNORMAL HIGH (ref 24–36)

## 2023-01-29 LAB — GLUCOSE, CAPILLARY
Glucose-Capillary: 85 mg/dL (ref 70–99)
Glucose-Capillary: 156 mg/dL — ABNORMAL HIGH (ref 70–99)
Glucose-Capillary: 154 mg/dL — ABNORMAL HIGH (ref 70–99)
Glucose-Capillary: 152 mg/dL — ABNORMAL HIGH (ref 70–99)

## 2023-01-29 LAB — CBC
HCT: 28.4 % — ABNORMAL LOW (ref 39.0–52.0)
Hemoglobin: 9.3 g/dL — ABNORMAL LOW (ref 13.0–17.0)
MCH: 29.2 pg (ref 26.0–34.0)
MCHC: 32.7 g/dL (ref 30.0–36.0)
MCV: 89 fL (ref 80.0–100.0)
Platelets: 162 10*3/uL (ref 150–400)
RBC: 3.19 MIL/uL — ABNORMAL LOW (ref 4.22–5.81)
RDW: 12.9 % (ref 11.5–15.5)
WBC: 6.5 10*3/uL (ref 4.0–10.5)
nRBC: 0 % (ref 0.0–0.2)

## 2023-01-29 LAB — PHOSPHORUS: Phosphorus: 2 mg/dL — ABNORMAL LOW (ref 2.5–4.6)

## 2023-01-29 LAB — BASIC METABOLIC PANEL
Anion gap: 7 (ref 5–15)
BUN: 29 mg/dL — ABNORMAL HIGH (ref 8–23)
CO2: 26 mmol/L (ref 22–32)
Calcium: 8.5 mg/dL — ABNORMAL LOW (ref 8.9–10.3)
Chloride: 107 mmol/L (ref 98–111)
Creatinine, Ser: 1.27 mg/dL — ABNORMAL HIGH (ref 0.61–1.24)
GFR, Estimated: >60 mL/min (ref >60)
Glucose, Bld: 113 mg/dL — ABNORMAL HIGH (ref 70–99)
Potassium: 4 mmol/L (ref 3.5–5.1)
Sodium: 140 mmol/L (ref 135–145)

## 2023-01-29 LAB — HEPARIN LEVEL (UNFRACTIONATED): Heparin Unfractionated: 0.46 [IU]/mL (ref 0.30–0.70)

## 2023-01-29 MED ORDER — DIPHENHYDRAMINE HCL 50 MG/ML IJ SOLN
50.0000 mg | Freq: Once | INTRAMUSCULAR | Status: AC
  Administered 2023-01-30: 50 mg via INTRAVENOUS
  Filled 2023-01-29: qty 1

## 2023-01-29 MED ORDER — SACUBITRIL-VALSARTAN 24-26 MG PO TABS
1.0000 | ORAL_TABLET | Freq: Two times a day (BID) | ORAL | Status: DC
  Administered 2023-01-29 – 2023-01-31 (×5): 1 via ORAL
  Filled 2023-01-29 (×5): qty 1

## 2023-01-29 MED ORDER — PREDNISONE 10 MG PO TABS
50.0000 mg | ORAL_TABLET | Freq: Four times a day (QID) | ORAL | Status: AC
  Administered 2023-01-29 (×2): 50 mg via ORAL
  Filled 2023-01-29 (×2): qty 5

## 2023-01-29 MED ORDER — DIPHENHYDRAMINE HCL 50 MG PO CAPS
50.0000 mg | ORAL_CAPSULE | Freq: Once | ORAL | Status: AC
  Filled 2023-01-29: qty 1

## 2023-01-29 MED ORDER — SODIUM CHLORIDE 0.9 % IV SOLN: INTRAVENOUS | Status: DC

## 2023-01-29 MED ORDER — ISOSORBIDE MONONITRATE ER 30 MG PO TB24
60.0000 mg | ORAL_TABLET | Freq: Every day | ORAL | Status: DC
  Administered 2023-01-29 – 2023-01-30 (×2): 60 mg via ORAL
  Filled 2023-01-29 (×2): qty 2

## 2023-01-29 MED ORDER — ASPIRIN 81 MG PO CHEW
81.0000 mg | CHEWABLE_TABLET | ORAL | Status: AC
  Administered 2023-01-30: 81 mg via ORAL
  Filled 2023-01-29: qty 1

## 2023-01-29 MED ORDER — PREDNISONE 10 MG PO TABS
50.0000 mg | ORAL_TABLET | Freq: Four times a day (QID) | ORAL | Status: DC
Start: 2023-01-29 — End: 2023-01-29

## 2023-01-29 NOTE — Consult Note (Signed)
PHARMACY CONSULT NOTE - ELECTROLYTES  Pharmacy Consult for Electrolyte Monitoring and Replacement   Recent Labs: Height: 5\' 10"  (177.8 cm) Weight: 57.6 kg (126 lb 15.8 oz) IBW/kg (Calculated) : 73 Estimated Creatinine Clearance: 47.9 mL/min (A) (by C-G formula based on SCr of 1.27 mg/dL (H)). Potassium (mmol/L)  Date Value  01/29/2023 4.0   Magnesium (mg/dL)  Date Value  81/19/1478 2.0   Calcium (mg/dL)  Date Value  29/56/2130 8.5 (L)   Albumin (g/dL)  Date Value  86/57/8469 4.1   Phosphorus (mg/dL)  Date Value  62/95/2841 2.0 (L)   Sodium (mmol/L)  Date Value  01/29/2023 140    Assessment  Scott Roberson is a 62 y.o. male presenting with respiratory distress.  Pharmacy has been consulted to monitor and replace electrolytes.  Goal of Therapy: Electrolytes WNL  Plan:  No replacement currently indicated Check electrolytes with AM labs  Thank you for allowing pharmacy to be a part of this patient's care.  Lowella Bandy, PharmD Clinical Pharmacist 01/29/2023 7:21 AM

## 2023-01-29 NOTE — Evaluation (Signed)
Physical Therapy Evaluation Patient Details Name: Scott Roberson MRN: [id number] DOB: 12-May-1960 Today's Date: 01/29/2023  History of Present Illness  Pt is a 62 y/o M admitted on 01/26/23 after presenting with c/o respiratory distress. Pt found to be in hypoxic respiratory failure requiring intubation & mechanical ventilation. Pt with brief PEA arrest requiring 1 round of CPR, profound shock. Pt extubated 01/27/23, tested positive for adenovirus. PMH: COPD, alcohol use disorder  Clinical Impression  Pt seen for PT evaluation with pt agreeable to tx. Pt reports prior to admission he was ambulatory primarily with [facility], denies falls. On this date, pt is able to complete bed mobility with mod I, STS with CGA. Pt ambulates around entire ICU with RW & supervision with decreased gait speed but no LOB.  Pt eager to d/c home. Will continue to follow pt acutely to address balance, strengthening, endurance & gait with LRAD.      If plan is discharge home, recommend the following: A little help with bathing/dressing/bathroom;A little help with walking and/or transfers;Assistance with cooking/housework;Assist for transportation;Help with stairs or ramp for entrance   Can travel by private vehicle        Equipment Recommendations None recommended by PT  Recommendations for Other Services       Functional Status Assessment Patient has had a recent decline in their functional status and demonstrates the ability to make significant improvements in function in a reasonable and predictable amount of time.     Precautions / Restrictions Precautions Precautions: Fall Restrictions Weight Bearing Restrictions Per Provider Order: No      Mobility  Bed Mobility Overal bed mobility: Needs Assistance Bed Mobility: Supine to Sit     Supine to sit: Modified independent (Device/Increase time), HOB elevated, Used rails          Transfers Overall transfer level: Needs assistance Equipment used:  Rolling walker (2 wheels), 1 person hand held assist Transfers: Sit to/from Stand, Bed to chair/wheelchair/BSC Sit to Stand: Contact guard assist   Step pivot transfers: Min assist       General transfer comment: STS from EOB, recliner without AD & with RW, cuing re: hand placement to push to standing when using RW. Step pivot bed>recliner with HHA & min assist.    Ambulation/Gait Ambulation/Gait assistance: Contact guard assist, Supervision Gait Distance (Feet): 200 Feet Assistive device: Crutches Gait Pattern/deviations: Decreased step length - left, Decreased stride length, Decreased step length - right Gait velocity: decreased        Stairs            Wheelchair Mobility     Tilt Bed    Modified Rankin (Stroke Patients Only)       Balance Overall balance assessment: Needs assistance Sitting-balance support: Feet supported Sitting balance-Leahy Scale: Good     Standing balance support: Bilateral upper extremity supported, During functional activity Standing balance-Leahy Scale: Fair                               Pertinent Vitals/Pain Pain Assessment Pain Assessment: No/denies pain    Home Living Family/patient expects to be discharged to:: Private residence (boarding house) Living Arrangements: Other (Comment) (boarding house)   Type of Home: House Home Access: Stairs to enter Entrance Stairs-Rails: None Entrance Stairs-Number of Steps: 4     Home Equipment: Agricultural consultant (2 wheels);Cane - single point Additional Comments: Pt has a room in a boarding house    Prior Function  Prior Level of Function : Independent/Modified Independent             Mobility Comments: Ambulatory with SPC, denies falls.       Extremity/Trunk Assessment   Upper Extremity Assessment Upper Extremity Assessment: Overall WFL for tasks assessed    Lower Extremity Assessment Lower Extremity Assessment: Generalized weakness       Communication    Communication Communication: No apparent difficulties  Cognition Arousal: Alert Behavior During Therapy: WFL for tasks assessed/performed Overall Cognitive Status: Within Functional Limits for tasks assessed                                          General Comments General comments (skin integrity, edema, etc.): pt on room air, inconsistent pleth waveform throughout, pt denies SOB.    Exercises     Assessment/Plan    PT Assessment Patient needs continued PT services  PT Problem List Decreased strength;Cardiopulmonary status limiting activity;Decreased activity tolerance;Decreased balance;Decreased mobility;Decreased safety awareness;Decreased knowledge of use of DME       PT Treatment Interventions DME instruction;Modalities;Gait training;Neuromuscular re-education;Balance training;Stair training;Functional mobility training;Patient/family education;Therapeutic activities;Therapeutic exercise;Manual techniques    PT Goals (Current goals can be found in the Care Plan section)  Acute Rehab PT Goals Patient Stated Goal: go home PT Goal Formulation: With patient Time For Goal Achievement: 02/12/23 Potential to Achieve Goals: Good    Frequency Min 1X/week     Co-evaluation               AM-PAC PT "6 Clicks" Mobility  Outcome Measure Help needed turning from your back to your side while in a flat bed without using bedrails?: None Help needed moving from lying on your back to sitting on the side of a flat bed without using bedrails?: A Little Help needed moving to and from a bed to a chair (including a wheelchair)?: A Little Help needed standing up from a chair using your arms (e.g., wheelchair or bedside chair)?: A Little Help needed to walk in hospital room?: A Little Help needed climbing 3-5 steps with a railing? : A Little 6 Click Score: 19    End of Session   Activity Tolerance: Patient tolerated treatment well Patient left: with call  bell/phone within reach;in chair   PT Visit Diagnosis: Unsteadiness on feet (R26.81);Muscle weakness (generalized) (M62.81)    Time: 2956-2130 PT Time Calculation (min) (ACUTE ONLY): 20 min   Charges:   PT Evaluation $PT Eval Moderate Complexity: 1 Mod   PT General Charges $$ ACUTE PT VISIT: 1 Visit         Aleda Grana, PT, DPT 01/29/23, 9:44 AM   Sandi Mariscal 01/29/2023, 9:43 AM

## 2023-01-29 NOTE — Progress Notes (Signed)
ANTICOAGULATION CONSULT NOTE  Pharmacy Consult for heparin infusion Indication: ACS/STEMI  Allergies  Allergen Reactions   Iodinated Contrast Media Hives and Rash    Patient Measurements: Height: 5\' 10"  (177.8 cm) Weight: 57.6 kg (126 lb 15.8 oz) IBW/kg (Calculated) : 73 Heparin Dosing Weight: 68 kg  Vital Signs: Temp: 98.2 F (36.8 C) (12/28 2000) Temp Source: Oral (12/28 2000) BP: 110/57 (12/29 0000) Pulse Rate: 64 (12/29 0000)  Labs: Recent Labs    01/26/23 0255 01/26/23 0900 01/26/23 1003 01/26/23 1225 01/26/23 2146 01/27/23 0355 01/27/23 1357 01/28/23 0507 01/28/23 1559 01/29/23 0009  HGB 12.9*  --   --   --   --  11.7*  --  10.3*  --   --   HCT 41.4  --   --   --   --  36.2*  --  32.2*  --   --   PLT 311  --   --   --   --  181  --  157  --   --   APTT  --  200*  --   --    < > 67*   < > 65* 66* 77*  LABPROT  --  18.2*  --   --   --   --   --   --   --   --   INR  --  1.5*  --   --   --   --   --   --   --   --   HEPARINUNFRC  --  >1.10*  --   --   --  >1.10*  --  0.87*  --   --   CREATININE 1.15  --   --   --   --  2.07*  --  1.35*  --   --   CKTOTAL  --  151  --   --   --   --   --   --   --   --   TROPONINIHS 188* 870* 814* 642*  --   --   --   --   --   --    < > = values in this interval not displayed.    Estimated Creatinine Clearance: 45 mL/min (A) (by C-G formula based on SCr of 1.35 mg/dL (H)).   Medical History: No past medical history on file.  Assessment: Pt is a 62 yo male presenting to ED in resp distress found with elevated troponin I and BNP.  Goal of Therapy:  Heparin level 0.3-0.7 units/ml Monitor platelets by anticoagulation protocol: Yes  Date/Time: aPTT / HL: Rate / Comment: 12/26@0900  200 / >1.10 Suprathera@900  units/hr 12/26@2146    76 / --          Therapeutic x 1 at 700 units/hr 12/27@0355  67 / > 1.1 Barely therapeutic 12/27@1357  60 / --  SUBtherpeutic@800  units/hr 12/27 2339 77 / --  Therapeutic x 1 12/28 0507 65 /  0.87 Slightly subtherapeutic 12/28 1559 66 / --  Therapeutic x1 12/29 0009 77 / --  Therapeutic x 2   Plan:  Continue heparin infusion at 1100 units/hr Recheck aPTT daily w/ AM labs while therapeutic Will use aPTT to guide dosing until both levels correlate, then will transition to HL dosing HL and CBC daily while on heparin  Thank you for involving pharmacy in this patient's care.   Otelia Sergeant, PharmD, [facility] 01/29/2023 12:53 AM

## 2023-01-29 NOTE — H&P (View-Only) (Signed)
Patient Name: Scott Roberson Date of Encounter: 01/29/2023 [facility] Cardiologist: None   Interval Summary  .    Patient seen on a.m. rounds.  He denies any chest pain or shortness of breath.  Continues to complain of his eyes burning.  No significant events reported overnight  Vital Signs .    Vitals:   01/28/23 2300 01/29/23 0000 01/29/23 0720 01/29/23 0800  BP: (!) 119/57 (!) 110/57 139/70 (!) 143/64  Pulse: 70 64 66 71  Resp: 15 14 18  (!) 22  Temp:      TempSrc:      SpO2: 97% 100% 95% 93%  Weight:      Height:        Intake/Output Summary (Last 24 hours) at 01/29/2023 0819 Last data filed at 01/29/2023 0800 Gross per 24 hour  Intake 759.25 ml  Output 1275 ml  Net -515.75 ml      01/28/2023    2:32 AM 01/27/2023    4:00 AM 01/26/2023    1:46 AM  Last 3 Weights  Weight (lbs) 126 lb 15.8 oz 126 lb 12.2 oz 125 lb 3.5 oz  Weight (kg) 57.6 kg 57.5 kg 56.8 kg      Telemetry/ECG    Sinus rhythm rate of 60 and 70s with unifocal PVCs- Personally Reviewed  Physical Exam .   GEN: No acute distress.   Neck: No JVD Cardiac: RRR, no murmurs, rubs, or gallops.  Respiratory: Clear upper lobes with slightly diminished bases to auscultation bilaterally.  Respirations are unlabored at rest on room air GI: Soft, nontender, non-distended  MS: No edema  Assessment & Plan .     Non-STEMI -Elevated troponin suspected to be supply demand mismatch in the setting of profound hypoxia, tachycardia, PEA arrest x 2 with CPR as well as UDS being positive for cocaine -Echo concerning for old anterior MI, Q waves on EKG anteriorly with ST abnormality in inferior lateral leads concerning for ischemia that have improved -High-sensitivity troponin were flat trending -Continued on heparin until right and left heart catheterization with minimum of 48 hours procedure tentatively scheduled for Monday -Continue aspirin 81 mg daily atorvastatin 40 mg daily -EKG as needed for pain  or changes -Further recommendations after right and left heart catheterization -Continues to remain chest pain-free  Cardiomyopathy -BNP 1004.9 on arrival -Echocardiogram concerning for old anterior MI -Chest x-ray with no acute cardiopulmonary disease -Appears to be euvolemic on exam -Daily weights, I's and O's, low-sodium diet -Tentatively scheduled for right and left heart catheterization 01/30/2023 for continued ischemic workup -Will require premedication for allergy with IV contrast -NPO after midnight -Will escalate GDMT as tolerated by kidney function -Started on Entresto 24/26 mg twice daily  AKI -Serum creatinine 2.07 on 01/27/2023 -Serum creatinine is improved to 1.27 this morning -Monitor urine output -Daily BMP -Monitor/trend/replete electrolytes as needed -Continue to avoid nephrotoxic agents were able  Anemia -Hemoglobin 14.4 on 01/26/2023 -Hemoglobin 9.3 this morning -No active signs of bleeding -Has been maintained on heparin infusion -daily CBC  Sepsis due to pneumonia -Continued on antibiotics -No longer requiring pressors -Received fluid resuscitation on arrival -Extubated 01/27/2023 -Found to be adenovirus positive  Acute on chronic hypoxic and hypercapnic respiratory failure/COPD exacerbation/pneumonia -Currently maintaining oxygen saturations on room air -Denies any worsening shortness of breath -Continued on antibiotics, DuoNebs, steroid therapy -Supportive care -Continue management per PCCM  Hypertension -Blood pressure 143/64 -Blood pressure has been better controlled -Continued on current regimen -Vital signs per unit protocol  Polysubstance abuse -Urine drug screen positive for cocaine -Continued on CIWA protocol For questions or updates, please contact [facility] Please consult [email or web address] for contact info under        Signed, Evann Erazo, NP

## 2023-01-29 NOTE — Progress Notes (Signed)
PROGRESS NOTE    Scott Roberson  WGN:[id number] DOB: [birth year] DOA: 01/26/2023 PCP: No primary care provider on file.    Assessment & Plan:   Principal Problem:   Acute on chronic respiratory failure with hypoxia and hypercapnia (HCC) Active Problems:   Acute respiratory failure with hypoxia and hypercapnia (HCC)   Non-STEMI (non-ST elevated myocardial infarction) (HCC)   History of acute anterior wall MI   COPD, very severe (HCC)   Cocaine abuse (HCC)  Assessment and Plan: Acute hypoxic respiratory failure: secondary to COPD exacerbation and adenovirus. Continue on supplemental oxygen and wean as tolerated.  COPD exacerbation: continue on IV ceftriaxone, bronchodilators & encourage incentive spirometry   NSTEMI: continue on IV heparin drip. Continue on coreg, statin, entresto. Will go for cardiac cath tomorrow as per cardio   Acute systolic CHF: echo w/ EF 35-40% w/ regional wall motion abnormalities. Continue on coreg, entresto, statin, aspirin  Alcohol use: drinks 6 beers per night. Continue on CIWA protocol  AKI: Cr is labile. Avoid nephrotoxic meds  Illicit drug use: urine drug screen was positive for cocaine. Received illicit drug use cessation counseling   Smoker: smoking cessation counseling x 5 mins. Refused a nicotine patch       DVT prophylaxis: (heparin drip  Code Status: full  Family Communication:  Disposition Plan: likely d/c back home w/ HH   Level of care: Progressive  Status is: Inpatient Remains inpatient appropriate because: severity of illness, cardiac cath tomorrow    Consultants:  Cardio ICU   Procedures:   Antimicrobials: rocephin    Subjective: Pt c/o malaise   Objective: Vitals:   01/29/23 0900 01/29/23 0905 01/29/23 0909 01/29/23 1000  BP: 139/63 (!) 143/64 (!) 143/64 (!) 142/59  Pulse: 64 75 82 79  Resp: (!) 22  17 18   Temp:      TempSrc:      SpO2: 92%     Weight:      Height:        Intake/Output Summary (Last  24 hours) at 01/29/2023 1144 Last data filed at 01/29/2023 1100 Gross per 24 hour  Intake 326.92 ml  Output 1150 ml  Net -823.08 ml   Filed Weights   01/26/23 0146 01/27/23 0400 01/28/23 0232  Weight: 56.8 kg 57.5 kg 57.6 kg    Examination:  General exam: Appears calm and comfortable. Frail appearing  Respiratory system: decreased breath sounds b/l  Cardiovascular system: S1 & S2 +. No rubs, gallops or clicks. Gastrointestinal system: Abdomen is nondistended, soft and nontender. Normal bowel sounds heard. Central nervous system: Alert and oriented. Moves all extremities  Psychiatry: Judgement and insight appear normal. Flat mood and affect    Data Reviewed: I have personally reviewed following labs and imaging studies  CBC: Recent Labs  Lab 01/26/23 0010 01/26/23 0255 01/27/23 0355 01/28/23 0507 01/29/23 0513  WBC 9.3 14.6* 8.5 8.4 6.5  NEUTROABS 4.9  --   --   --   --   HGB 14.4 12.9* 11.7* 10.3* 9.3*  HCT 46.4 41.4 36.2* 32.2* 28.4*  MCV 91.9 91.4 90.0 89.4 89.0  PLT 366 311 181 157 162   Basic Metabolic Panel: Recent Labs  Lab 01/26/23 0010 01/26/23 0255 01/27/23 0355 01/28/23 0507 01/29/23 0513  NA 137 141 141 140 140  K 3.7 3.5 5.0 4.3 4.0  CL 100 102 103 104 107  CO2 24 26 26 29 26   GLUCOSE 175* 116* 122* 143* 113*  BUN 9 9 23  31* 29*  CREATININE 1.21 1.15 2.07* 1.35* 1.27*  CALCIUM 9.2 11.7* 9.8 8.8* 8.5*  MG 1.9 2.6* 2.3 2.2 2.0  PHOS  --  7.3* 4.0 2.5 2.0*   GFR: Estimated Creatinine Clearance: 47.9 mL/min (A) (by C-G formula based on SCr of 1.27 mg/dL (H)). Liver Function Tests: Recent Labs  Lab 01/26/23 0010  AST 23  ALT 12  ALKPHOS 103  BILITOT 0.4  PROT 8.6*  ALBUMIN 4.1   No results for input(s): "LIPASE", "AMYLASE" in the last 168 hours. No results for input(s): "AMMONIA" in the last 168 hours. Coagulation Profile: Recent Labs  Lab 01/26/23 0900  INR 1.5*   Cardiac Enzymes: Recent Labs  Lab 01/26/23 0900  CKTOTAL 151    BNP (last 3 results) No results for input(s): "PROBNP" in the last 8760 hours. HbA1C: No results for input(s): "HGBA1C" in the last 72 hours. CBG: Recent Labs  Lab 01/28/23 0752 01/28/23 1127 01/28/23 1646 01/28/23 2124 01/29/23 0755  GLUCAP 94 96 143* 155* 85   Lipid Profile: No results for input(s): "CHOL", "HDL", "LDLCALC", "TRIG", "CHOLHDL", "LDLDIRECT" in the last 72 hours. Thyroid Function Tests: No results for input(s): "TSH", "T4TOTAL", "FREET4", "T3FREE", "THYROIDAB" in the last 72 hours. Anemia Panel: No results for input(s): "VITAMINB12", "FOLATE", "FERRITIN", "TIBC", "IRON", "RETICCTPCT" in the last 72 hours. Sepsis Labs: Recent Labs  Lab 01/26/23 0010 01/26/23 0255 01/26/23 1003 01/26/23 1225 01/26/23 1448  PROCALCITON <0.10  --   --   --   --   LATICACIDVEN 3.4* 4.3* 1.4 1.5 1.3    Recent Results (from the past 240 hours)  Blood culture (routine x 2)     Status: None (Preliminary result)   Collection Time: 01/26/23 12:10 AM   Specimen: BLOOD  Result Value Ref Range Status   Specimen Description BLOOD RIGHT WRIST  Final   Special Requests   Final    BOTTLES DRAWN AEROBIC AND ANAEROBIC Blood Culture results may not be optimal due to an inadequate volume of blood received in culture bottles   Culture   Final    NO GROWTH 3 DAYS Performed at [facility], [address] [place], [place] [address]    Report Status PENDING  Incomplete  MRSA Next Gen by PCR, Nasal     Status: None   Collection Time: 01/26/23  1:53 AM   Specimen: Nasal Mucosa; Nasal Swab  Result Value Ref Range Status   MRSA by PCR Next Gen NOT DETECTED NOT DETECTED Final    Comment: (NOTE) The GeneXpert MRSA Assay (FDA approved for NASAL specimens only), is one component of a comprehensive MRSA colonization surveillance program. It is not intended to diagnose MRSA infection nor to guide or monitor treatment for MRSA infections. Test performance is not FDA approved in  patients less than 88 years old. Performed at [facility], [address] [place], [place] [address]   Blood culture (routine x 2)     Status: None (Preliminary result)   Collection Time: 01/26/23  2:55 AM   Specimen: BLOOD  Result Value Ref Range Status   Specimen Description BLOOD LEFT ARM  Final   Special Requests   Final    BOTTLES DRAWN AEROBIC AND ANAEROBIC Blood Culture results may not be optimal due to an inadequate volume of blood received in culture bottles   Culture   Final    NO GROWTH 3 DAYS Performed at [facility], [address], [place], [place] [address]    Report Status PENDING  Incomplete  Resp panel by RT-PCR (RSV, Flu A&B, Covid) Anterior Nasal Swab     Status: None   Collection Time: 01/26/23  4:10 AM   Specimen: Anterior Nasal Swab  Result Value Ref Range Status   SARS Coronavirus 2 by RT PCR NEGATIVE NEGATIVE Final    Comment: (NOTE) SARS-CoV-2 target nucleic acids are NOT DETECTED.  The SARS-CoV-2 RNA is generally detectable in upper respiratory specimens during the acute phase of infection. The lowest concentration of SARS-CoV-2 viral copies this assay can detect is 138 copies/mL. A negative result does not preclude SARS-Cov-2 infection and should not be used as the sole basis for treatment or other patient management decisions. A negative result may occur with  improper specimen collection/handling, submission of specimen other than nasopharyngeal swab, presence of viral mutation(s) within the areas targeted by this assay, and inadequate number of viral copies(<138 copies/mL). A negative result must be combined with clinical observations, patient history, and epidemiological information. The expected result is Negative.  Fact Sheet for Patients:  BloggerCourse.com  Fact Sheet for Healthcare Providers:  SeriousBroker.it  This test is no t yet approved or cleared by the [place] FDA and  has been authorized for detection and/or diagnosis of SARS-CoV-2 by FDA under an Emergency Use Authorization (EUA). This EUA will remain  in effect (meaning this test can be used) for the duration of the COVID-19 declaration under Section 564(b)(1) of the Act, 21 U.S.C.section 360bbb-3(b)(1), unless the authorization is terminated  or revoked sooner.       Influenza A by PCR NEGATIVE NEGATIVE Final   Influenza B by PCR NEGATIVE NEGATIVE Final    Comment: (NOTE) The Xpert Xpress SARS-CoV-2/FLU/RSV plus assay is intended as an aid in the diagnosis of influenza from Nasopharyngeal swab specimens and should not be used as a sole basis for treatment. Nasal washings and aspirates are unacceptable for Xpert Xpress SARS-CoV-2/FLU/RSV testing.  Fact Sheet for Patients: BloggerCourse.com  Fact Sheet for Healthcare Providers: SeriousBroker.it  This test is not yet approved or cleared by the [place] FDA and has been authorized for detection and/or diagnosis of SARS-CoV-2 by FDA under an Emergency Use Authorization (EUA). This EUA will remain in effect (meaning this test can be used) for the duration of the COVID-19 declaration under Section 564(b)(1) of the Act, 21 U.S.C. section 360bbb-3(b)(1), unless the authorization is terminated or revoked.     Resp Syncytial Virus by PCR NEGATIVE NEGATIVE Final    Comment: (NOTE) Fact Sheet for Patients: BloggerCourse.com  Fact Sheet for Healthcare Providers: SeriousBroker.it  This test is not yet approved or cleared by the [place] FDA and has been authorized for detection and/or diagnosis of SARS-CoV-2 by FDA under an Emergency Use Authorization (EUA). This EUA will remain in effect (meaning this test can be used) for the duration of the COVID-19 declaration under Section 564(b)(1) of the Act, 21 U.S.C. section  360bbb-3(b)(1), unless the authorization is terminated or revoked.  Performed at [facility], [address] [place], [place] [address]   Respiratory (~20 pathogens) panel by PCR     Status: Abnormal   Collection Time: 01/26/23  4:10 AM   Specimen: Nasopharyngeal Swab; Respiratory  Result Value Ref Range Status   Adenovirus DETECTED (A) NOT DETECTED Final   Coronavirus 229E NOT DETECTED NOT DETECTED Final    Comment: (NOTE) The Coronavirus on the Respiratory Panel, DOES NOT test for the novel  Coronavirus (2019 nCoV)    Coronavirus HKU1 NOT DETECTED NOT  DETECTED Final   Coronavirus NL63 NOT DETECTED NOT DETECTED Final   Coronavirus OC43 NOT DETECTED NOT DETECTED Final   Metapneumovirus NOT DETECTED NOT DETECTED Final   Rhinovirus / Enterovirus NOT DETECTED NOT DETECTED Final   Influenza A NOT DETECTED NOT DETECTED Final   Influenza B NOT DETECTED NOT DETECTED Final   Parainfluenza Virus 1 NOT DETECTED NOT DETECTED Final   Parainfluenza Virus 2 NOT DETECTED NOT DETECTED Final   Parainfluenza Virus 3 NOT DETECTED NOT DETECTED Final   Parainfluenza Virus 4 NOT DETECTED NOT DETECTED Final   Respiratory Syncytial Virus NOT DETECTED NOT DETECTED Final   Bordetella pertussis NOT DETECTED NOT DETECTED Final   Bordetella Parapertussis NOT DETECTED NOT DETECTED Final   Chlamydophila pneumoniae NOT DETECTED NOT DETECTED Final   Mycoplasma pneumoniae NOT DETECTED NOT DETECTED Final    Comment: Performed at [facility], [address], [place], [place] [address]  Culture, Respiratory w Gram Stain     Status: None (Preliminary result)   Collection Time: 01/26/23 11:29 AM   Specimen: Tracheal Aspirate  Result Value Ref Range Status   Specimen Description   Final    TRACHEAL ASPIRATE Performed at [facility], [address], [place], [place] [address]    Special Requests   Final    NONE Performed at [facility], [address] [place], [place]  [address]    Gram Stain   Final    ABUNDANT WBC PRESENT, PREDOMINANTLY PMN RARE GRAM POSITIVE COCCI    Culture   Final    MODERATE MORAXELLA CATARRHALIS(BRANHAMELLA) SUSCEPTIBILITIES TO FOLLOW Performed at [facility], [address] [place], [place] [address]    Report Status PENDING  Incomplete         Radiology Studies: No results found.      Scheduled Meds:  aspirin  81 mg Oral Daily   atorvastatin  40 mg Oral Daily   carvedilol  6.25 mg Oral BID WC   chlordiazePOXIDE  25 mg Oral TID   Followed by   chlordiazePOXIDE  25 mg Oral BH-qamhs   Followed by   Melene Muller ON 01/30/2023] chlordiazePOXIDE  25 mg Oral Daily   Chlorhexidine Gluconate Cloth  6 each Topical Daily   cloNIDine  0.1 mg Oral TID   diphenhydrAMINE  50 mg Oral Once   Or   diphenhydrAMINE  50 mg Intravenous Once   famotidine  20 mg Oral BID   folic acid  1 mg Oral Daily   ipratropium-albuterol  3 mL Nebulization BID   multivitamin with minerals  1 tablet Oral Daily   nitroGLYCERIN  1 inch Topical Q6H   predniSONE  40 mg Oral Q breakfast   predniSONE  50 mg Oral Q6H   sacubitril-valsartan  1 tablet Oral BID   thiamine  100 mg Oral Daily   Or   thiamine  100 mg Intravenous Daily   Continuous Infusions:  cefTRIAXone (ROCEPHIN)  IV Stopped (01/29/23 0407)   heparin 1,100 Units/hr (01/29/23 0950)     LOS: 3 days       Charise Killian, MD [facility] Pager [phone]  If 7PM-7AM, please contact night-coverage [email or web address] 01/29/2023, 11:44 AM

## 2023-01-29 NOTE — Progress Notes (Signed)
Patient Name: Scott Roberson Date of Encounter: 01/29/2023 [facility] Cardiologist: None   Interval Summary  .    Patient seen on a.m. rounds.  He denies any chest pain or shortness of breath.  Continues to complain of his eyes burning.  No significant events reported overnight  Vital Signs .    Vitals:   01/28/23 2300 01/29/23 0000 01/29/23 0720 01/29/23 0800  BP: (!) 119/57 (!) 110/57 139/70 (!) 143/64  Pulse: 70 64 66 71  Resp: 15 14 18  (!) 22  Temp:      TempSrc:      SpO2: 97% 100% 95% 93%  Weight:      Height:        Intake/Output Summary (Last 24 hours) at 01/29/2023 0819 Last data filed at 01/29/2023 0800 Gross per 24 hour  Intake 759.25 ml  Output 1275 ml  Net -515.75 ml      01/28/2023    2:32 AM 01/27/2023    4:00 AM 01/26/2023    1:46 AM  Last 3 Weights  Weight (lbs) 126 lb 15.8 oz 126 lb 12.2 oz 125 lb 3.5 oz  Weight (kg) 57.6 kg 57.5 kg 56.8 kg      Telemetry/ECG    Sinus rhythm rate of 60 and 70s with unifocal PVCs- Personally Reviewed  Physical Exam .   GEN: No acute distress.   Neck: No JVD Cardiac: RRR, no murmurs, rubs, or gallops.  Respiratory: Clear upper lobes with slightly diminished bases to auscultation bilaterally.  Respirations are unlabored at rest on room air GI: Soft, nontender, non-distended  MS: No edema  Assessment & Plan .     Non-STEMI -Elevated troponin suspected to be supply demand mismatch in the setting of profound hypoxia, tachycardia, PEA arrest x 2 with CPR as well as UDS being positive for cocaine -Echo concerning for old anterior MI, Q waves on EKG anteriorly with ST abnormality in inferior lateral leads concerning for ischemia that have improved -High-sensitivity troponin were flat trending -Continued on heparin until right and left heart catheterization with minimum of 48 hours procedure tentatively scheduled for Monday -Continue aspirin 81 mg daily atorvastatin 40 mg daily -EKG as needed for pain  or changes -Further recommendations after right and left heart catheterization -Continues to remain chest pain-free  Cardiomyopathy -BNP 1004.9 on arrival -Echocardiogram concerning for old anterior MI -Chest x-ray with no acute cardiopulmonary disease -Appears to be euvolemic on exam -Daily weights, I's and O's, low-sodium diet -Tentatively scheduled for right and left heart catheterization 01/30/2023 for continued ischemic workup -Will require premedication for allergy with IV contrast -NPO after midnight -Will escalate GDMT as tolerated by kidney function -Started on Entresto 24/26 mg twice daily  AKI -Serum creatinine 2.07 on 01/27/2023 -Serum creatinine is improved to 1.27 this morning -Monitor urine output -Daily BMP -Monitor/trend/replete electrolytes as needed -Continue to avoid nephrotoxic agents were able  Anemia -Hemoglobin 14.4 on 01/26/2023 -Hemoglobin 9.3 this morning -No active signs of bleeding -Has been maintained on heparin infusion -daily CBC  Sepsis due to pneumonia -Continued on antibiotics -No longer requiring pressors -Received fluid resuscitation on arrival -Extubated 01/27/2023 -Found to be adenovirus positive  Acute on chronic hypoxic and hypercapnic respiratory failure/COPD exacerbation/pneumonia -Currently maintaining oxygen saturations on room air -Denies any worsening shortness of breath -Continued on antibiotics, DuoNebs, steroid therapy -Supportive care -Continue management per PCCM  Hypertension -Blood pressure 143/64 -Blood pressure has been better controlled -Continued on current regimen -Vital signs per unit protocol  Polysubstance abuse -Urine drug screen positive for cocaine -Continued on CIWA protocol For questions or updates, please contact [facility] Please consult [email or web address] for contact info under        Signed, Evann Erazo, NP

## 2023-01-29 NOTE — Progress Notes (Signed)
ANTICOAGULATION CONSULT NOTE  Pharmacy Consult for heparin infusion Indication: ACS/STEMI  Allergies  Allergen Reactions   Iodinated Contrast Media Hives and Rash    Patient Measurements: Height: 5\' 10"  (177.8 cm) Weight: 57.6 kg (126 lb 15.8 oz) IBW/kg (Calculated) : 73 Heparin Dosing Weight: 68 kg  Vital Signs: Temp: 98.2 F (36.8 C) (12/28 2000) Temp Source: Oral (12/28 2000) BP: 110/57 (12/29 0000) Pulse Rate: 64 (12/29 0000)  Labs: Recent Labs    01/26/23 0900 01/26/23 1003 01/26/23 1225 01/26/23 2146 01/27/23 0355 01/27/23 1357 01/28/23 0507 01/28/23 1559 01/29/23 0009 01/29/23 0513  HGB  --   --   --    < > 11.7*  --  10.3*  --   --  9.3*  HCT  --   --   --   --  36.2*  --  32.2*  --   --  28.4*  PLT  --   --   --   --  181  --  157  --   --  162  APTT 200*  --   --    < > 67*   < > 65* 66* 77* 76*  LABPROT 18.2*  --   --   --   --   --   --   --   --   --   INR 1.5*  --   --   --   --   --   --   --   --   --   HEPARINUNFRC >1.10*  --   --   --  >1.10*  --  0.87*  --   --  0.46  CREATININE  --   --   --   --  2.07*  --  1.35*  --   --  1.27*  CKTOTAL 151  --   --   --   --   --   --   --   --   --   TROPONINIHS 870* 814* 642*  --   --   --   --   --   --   --    < > = values in this interval not displayed.    Estimated Creatinine Clearance: 47.9 mL/min (A) (by C-G formula based on SCr of 1.27 mg/dL (H)).   Medical History: No past medical history on file.  Assessment: Pt is a 62 yo male presenting to ED in resp distress found with elevated troponin I and BNP.  Goal of Therapy:  Heparin level 0.3-0.7 units/ml Monitor platelets by anticoagulation protocol: Yes  Date/Time: aPTT / HL: Rate / Comment: 12/26@0900  200 / >1.10 Suprathera@900  units/hr 12/26@2146    76 / --          Therapeutic x 1 at 700 units/hr 12/27@0355  67 / > 1.1 Barely therapeutic 12/27@1357  60 / --  SUBtherpeutic@800  units/hr 12/27 2339 77 / --  Therapeutic x 1 12/28 0507 65 /  0.87 Slightly subtherapeutic 12/28 1559 66 / --  Therapeutic x1 12/29 0009 77 / --  Therapeutic x 2 12/29 0513 76 / 0.46 Therapeutic x 3 / Correlating x 1   Plan:  Continue heparin infusion at 1100 units/hr Recheck aPTT daily w/ AM labs while therapeutic Will use aPTT to guide dosing until both levels correlate, then will transition to HL dosing HL and CBC daily while on heparin  Thank you for involving pharmacy in this patient's care.   Otelia Sergeant, PharmD, [facility] 01/29/2023 7:22 AM

## 2023-01-29 NOTE — Plan of Care (Signed)
°  Problem: Clinical Measurements: Goal: Ability to maintain clinical measurements within normal limits will improve Outcome: Progressing   Problem: Pain Management: Goal: General experience of comfort will improve Outcome: Progressing   Problem: Safety: Goal: Ability to remain free from injury will improve Outcome: Progressing   Problem: Activity: Goal: Ability to tolerate increased activity will improve Outcome: Progressing

## 2023-01-30 ENCOUNTER — Encounter: Payer: Self-pay | Admitting: Pulmonary Disease

## 2023-01-30 ENCOUNTER — Encounter
Admission: EM | Disposition: A | Discharge status: Home Health | Payer: Self-pay | Source: Home / Self Care | Attending: Pulmonary Disease

## 2023-01-30 DIAGNOSIS — I214 Non-ST elevation (NSTEMI) myocardial infarction: Secondary | ICD-10-CM | POA: Diagnosis not present

## 2023-01-30 DIAGNOSIS — I469 Cardiac arrest, cause unspecified: Secondary | ICD-10-CM | POA: Diagnosis not present

## 2023-01-30 DIAGNOSIS — J9621 Acute and chronic respiratory failure with hypoxia: Secondary | ICD-10-CM | POA: Diagnosis not present

## 2023-01-30 DIAGNOSIS — J9622 Acute and chronic respiratory failure with hypercapnia: Secondary | ICD-10-CM | POA: Diagnosis not present

## 2023-01-30 HISTORY — PX: RIGHT/LEFT HEART CATH AND CORONARY ANGIOGRAPHY: CATH118266

## 2023-01-30 LAB — BASIC METABOLIC PANEL
Anion gap: 8 (ref 5–15)
BUN: 22 mg/dL (ref 8–23)
CO2: 24 mmol/L (ref 22–32)
Calcium: 8.3 mg/dL — ABNORMAL LOW (ref 8.9–10.3)
Chloride: 106 mmol/L (ref 98–111)
Creatinine, Ser: 0.96 mg/dL (ref 0.61–1.24)
GFR, Estimated: >60 mL/min (ref >60)
Glucose, Bld: 150 mg/dL — ABNORMAL HIGH (ref 70–99)
Potassium: 3.7 mmol/L (ref 3.5–5.1)
Sodium: 138 mmol/L (ref 135–145)

## 2023-01-30 LAB — POCT I-STAT 7, (LYTES, BLD GAS, ICA,H+H)
Acid-Base Excess: 0 mmol/L (ref 0.0–2.0)
Bicarbonate: 24.1 mmol/L (ref 20.0–28.0)
Calcium, Ion: 1.25 mmol/L (ref 1.15–1.40)
HCT: 28 % — ABNORMAL LOW (ref 39.0–52.0)
Hemoglobin: 9.5 g/dL — ABNORMAL LOW (ref 13.0–17.0)
O2 Saturation: 92 %
Potassium: 3.9 mmol/L (ref 3.5–5.1)
Sodium: 142 mmol/L (ref 135–145)
TCO2: 25 mmol/L (ref 22–32)
pCO2 arterial: 36.5 mm[Hg] (ref 32–48)
pH, Arterial: 7.428 (ref 7.35–7.45)
pO2, Arterial: 62 mm[Hg] — ABNORMAL LOW (ref 83–108)

## 2023-01-30 LAB — PHOSPHORUS: Phosphorus: 1.4 mg/dL — ABNORMAL LOW (ref 2.5–4.6)

## 2023-01-30 LAB — CBC
HCT: 30.9 % — ABNORMAL LOW (ref 39.0–52.0)
Hemoglobin: 10.2 g/dL — ABNORMAL LOW (ref 13.0–17.0)
MCH: 28.5 pg (ref 26.0–34.0)
MCHC: 33 g/dL (ref 30.0–36.0)
MCV: 86.3 fL (ref 80.0–100.0)
Platelets: 168 10*3/uL (ref 150–400)
RBC: 3.58 MIL/uL — ABNORMAL LOW (ref 4.22–5.81)
RDW: 12.7 % (ref 11.5–15.5)
WBC: 5.3 10*3/uL (ref 4.0–10.5)
nRBC: 0 % (ref 0.0–0.2)

## 2023-01-30 LAB — GLUCOSE, CAPILLARY
Glucose-Capillary: 131 mg/dL — ABNORMAL HIGH (ref 70–99)
Glucose-Capillary: 138 mg/dL — ABNORMAL HIGH (ref 70–99)
Glucose-Capillary: 142 mg/dL — ABNORMAL HIGH (ref 70–99)
Glucose-Capillary: 162 mg/dL — ABNORMAL HIGH (ref 70–99)
Glucose-Capillary: 199 mg/dL — ABNORMAL HIGH (ref 70–99)

## 2023-01-30 LAB — APTT: aPTT: 57 s — ABNORMAL HIGH (ref 24–36)

## 2023-01-30 LAB — MAGNESIUM: Magnesium: 1.8 mg/dL (ref 1.7–2.4)

## 2023-01-30 LAB — HEPARIN LEVEL (UNFRACTIONATED): Heparin Unfractionated: 0.37 [IU]/mL (ref 0.30–0.70)

## 2023-01-30 SURGERY — RIGHT/LEFT HEART CATH AND CORONARY ANGIOGRAPHY
Anesthesia: Moderate Sedation

## 2023-01-30 MED ORDER — HEPARIN SODIUM (PORCINE) 1000 UNIT/ML IJ SOLN
INTRAMUSCULAR | Status: DC | PRN
  Administered 2023-01-30: 3000 [IU] via INTRAVENOUS

## 2023-01-30 MED ORDER — HEPARIN (PORCINE) IN NACL 2000-0.9 UNIT/L-% IV SOLN
INTRAVENOUS | Status: DC | PRN
  Administered 2023-01-30: 1000 mL

## 2023-01-30 MED ORDER — VERAPAMIL HCL 2.5 MG/ML IV SOLN
INTRAVENOUS | Status: DC | PRN
  Administered 2023-01-30 (×2): 2.5 mg via INTRA_ARTERIAL

## 2023-01-30 MED ORDER — HEPARIN SODIUM (PORCINE) 1000 UNIT/ML IJ SOLN
INTRAMUSCULAR | Status: AC
  Filled 2023-01-30: qty 10

## 2023-01-30 MED ORDER — CLONIDINE HCL 0.1 MG PO TABS
0.1000 mg | ORAL_TABLET | Freq: Two times a day (BID) | ORAL | Status: DC
  Administered 2023-01-30 – 2023-01-31 (×2): 0.1 mg via ORAL
  Filled 2023-01-30 (×2): qty 1

## 2023-01-30 MED ORDER — SODIUM CHLORIDE 0.9% FLUSH: 3.0000 mL | INTRAVENOUS | Status: DC | PRN

## 2023-01-30 MED ORDER — SODIUM CHLORIDE 0.9% FLUSH
3.0000 mL | Freq: Two times a day (BID) | INTRAVENOUS | Status: DC
  Administered 2023-01-30 – 2023-01-31 (×2): 3 mL via INTRAVENOUS

## 2023-01-30 MED ORDER — ENOXAPARIN SODIUM 40 MG/0.4ML IJ SOSY
40.0000 mg | PREFILLED_SYRINGE | INTRAMUSCULAR | Status: DC
  Administered 2023-01-31: 40 mg via SUBCUTANEOUS
  Filled 2023-01-30: qty 0.4

## 2023-01-30 MED ORDER — ENSURE ENLIVE PO LIQD: 237.0000 mL | Freq: Three times a day (TID) | ORAL | Status: DC

## 2023-01-30 MED ORDER — IOHEXOL 300 MG/ML  SOLN
INTRAMUSCULAR | Status: DC | PRN
  Administered 2023-01-30: 27 mL

## 2023-01-30 MED ORDER — FENTANYL CITRATE (PF) 100 MCG/2ML IJ SOLN
INTRAMUSCULAR | Status: AC
  Filled 2023-01-30: qty 2

## 2023-01-30 MED ORDER — LIDOCAINE HCL (PF) 1 % IJ SOLN
INTRAMUSCULAR | Status: DC | PRN
  Administered 2023-01-30: 2 mL

## 2023-01-30 MED ORDER — SODIUM CHLORIDE 0.9 % IV SOLN: 250.0000 mL | INTRAVENOUS | Status: DC | PRN

## 2023-01-30 MED ORDER — MAGNESIUM SULFATE 2 GM/50ML IV SOLN
2.0000 g | Freq: Once | INTRAVENOUS | Status: AC
  Administered 2023-01-30: 2 g via INTRAVENOUS
  Filled 2023-01-30: qty 50

## 2023-01-30 MED ORDER — POTASSIUM PHOSPHATES 15 MMOLE/5ML IV SOLN: 20.0000 mmol | Freq: Once | INTRAVENOUS | Status: DC

## 2023-01-30 MED ORDER — HEPARIN BOLUS VIA INFUSION
1000.0000 [IU] | Freq: Once | INTRAVENOUS | Status: AC
  Administered 2023-01-30: 1000 [IU] via INTRAVENOUS
  Filled 2023-01-30: qty 1000

## 2023-01-30 MED ORDER — POTASSIUM PHOSPHATES 15 MMOLE/5ML IV SOLN
20.0000 mmol | Freq: Once | INTRAVENOUS | Status: AC
  Administered 2023-01-30: 20 mmol via INTRAVENOUS
  Filled 2023-01-30: qty 6.67

## 2023-01-30 MED ORDER — HYDRALAZINE HCL 20 MG/ML IJ SOLN
10.0000 mg | INTRAMUSCULAR | Status: AC | PRN
Start: 2023-01-30 — End: 2023-01-30
  Filled 2023-01-30: qty 1

## 2023-01-30 MED ORDER — HEPARIN (PORCINE) IN NACL 1000-0.9 UT/500ML-% IV SOLN
INTRAVENOUS | Status: AC
Start: 2023-01-30 — End: ?
  Filled 2023-01-30: qty 1000

## 2023-01-30 MED ORDER — VERAPAMIL HCL 2.5 MG/ML IV SOLN
INTRAVENOUS | Status: AC
Start: 2023-01-30 — End: ?
  Filled 2023-01-30: qty 2

## 2023-01-30 MED ORDER — MIDAZOLAM HCL 2 MG/2ML IJ SOLN
INTRAMUSCULAR | Status: AC
Start: 2023-01-30 — End: ?
  Filled 2023-01-30: qty 2

## 2023-01-30 SURGICAL SUPPLY — 16 items
CATH 5FR JL3.5 JR4 ANG PIG MP (CATHETERS) IMPLANT
CATH BALLN WEDGE 5F 110CM (CATHETERS) IMPLANT
DEVICE RAD TR BAND REGULAR (VASCULAR PRODUCTS) IMPLANT
DRAPE BRACHIAL (DRAPES) IMPLANT
GLIDESHEATH SLEND SS 6F .021 (SHEATH) IMPLANT
GUIDEWIRE INQWIRE 1.5J.035X260 (WIRE) IMPLANT
INQWIRE 1.5J .035X260CM (WIRE) ×1
KIT MICROPUNCTURE NIT STIFF (SHEATH) IMPLANT
KIT SYRINGE INJ CVI SPIKEX1 (MISCELLANEOUS) IMPLANT
PACK CARDIAC CATH (CUSTOM PROCEDURE TRAY) ×1 IMPLANT
PROTECTION STATION PRESSURIZED (MISCELLANEOUS) ×1
SET ATX-X65L (MISCELLANEOUS) IMPLANT
SHEATH AVANTI 5FR X 11CM (SHEATH) IMPLANT
SHEATH GLIDE SLENDER 4/5FR (SHEATH) IMPLANT
STATION PROTECTION PRESSURIZED (MISCELLANEOUS) IMPLANT
WIRE GUIDERIGHT .035X150 (WIRE) IMPLANT

## 2023-01-30 NOTE — Interval H&P Note (Signed)
History and Physical Interval Note:  01/30/2023 4:10 PM  Scott Roberson  has presented today for surgery, with the diagnosis of Non-ST elevated myocardial infarction.  The various methods of treatment have been discussed with the patient and family. After consideration of risks, benefits and other options for treatment, the patient has consented to  Procedure(s): RIGHT/LEFT HEART CATH AND CORONARY ANGIOGRAPHY (N/A) as a surgical intervention.  The patient's history has been reviewed, patient examined, no change in status, stable for surgery.  I have reviewed the patient's chart and labs.  Questions were answered to the patient's satisfaction.    Cath Lab Visit (complete for each Cath Lab visit)  Clinical Evaluation Leading to the Procedure:   ACS: Yes.    Non-ACS:  N/A  Nichole Neyer

## 2023-01-30 NOTE — Progress Notes (Signed)
Cardiology Progress Note   Patient Name: Scott Roberson Date of Encounter: 01/30/2023  Primary Cardiologist: Concha Se, MD   Subjective   No chest pain or sob.  NPO for cath this afternoon.  Questions answered. Objective   Inpatient Medications    Scheduled Meds:  aspirin  81 mg Oral Daily   atorvastatin  40 mg Oral Daily   carvedilol  6.25 mg Oral BID WC   chlordiazePOXIDE  25 mg Oral Daily   Chlorhexidine Gluconate Cloth  6 each Topical Daily   cloNIDine  0.1 mg Oral TID   diphenhydrAMINE  50 mg Oral Once   Or   diphenhydrAMINE  50 mg Intravenous Once   famotidine  20 mg Oral BID   folic acid  1 mg Oral Daily   isosorbide mononitrate  60 mg Oral Daily   multivitamin with minerals  1 tablet Oral Daily   sacubitril-valsartan  1 tablet Oral BID   thiamine  100 mg Oral Daily   Or   thiamine  100 mg Intravenous Daily   Continuous Infusions:  sodium chloride Stopped (01/30/23 1044)   heparin 1,250 Units/hr (01/30/23 1200)   potassium PHOSPHATE 20 mmol in dextrose 5 % 250 mL infusion 43 mL/hr at 01/30/23 1200   PRN Meds: acetaminophen, docusate sodium, ipratropium-albuterol, LORazepam **OR** LORazepam, mouth rinse, polyethylene glycol   Vital Signs    Vitals:   01/30/23 1000 01/30/23 1100 01/30/23 1200 01/30/23 1300  BP: (!) 129/58 (!) 121/59 (!) 118/59 130/69  Pulse: (!) 57 60 (!) 58 63  Resp: 20 (!) 23 (!) 24 15  Temp:      TempSrc:      SpO2: 96% 97% 94% 96%  Weight:      Height:        Intake/Output Summary (Last 24 hours) at 01/30/2023 1349 Last data filed at 01/30/2023 1200 Gross per 24 hour  Intake 798.3 ml  Output 550 ml  Net 248.3 ml   Filed Weights   01/27/23 0400 01/28/23 0232 01/30/23 0500  Weight: 57.5 kg 57.6 kg 58.3 kg    Physical Exam   GEN: Well nourished, well developed, in no acute distress.  HEENT: Grossly normal.  Neck: Supple, no JVD, carotid bruits, or masses. Cardiac: RRR, no murmurs, rubs, or gallops. No clubbing,  cyanosis, edema.  Radials 2+, DP/PT 2+ and equal bilaterally.  Respiratory:  Respirations regular and unlabored, clear to auscultation bilaterally. GI: Soft, nontender, nondistended, BS + x 4. MS: no deformity or atrophy. Skin: warm and dry, no rash. Neuro:  Strength and sensation are intact. Psych: AAOx3.  Normal affect.  Labs    Chemistry Recent Labs  Lab 01/26/23 0010 01/26/23 0255 01/28/23 0507 01/29/23 0513 01/30/23 0350  NA 137   < > 140 140 138  K 3.7   < > 4.3 4.0 3.7  CL 100   < > 104 107 106  CO2 24   < > 29 26 24   GLUCOSE 175*   < > 143* 113* 150*  BUN 9   < > 31* 29* 22  CREATININE 1.21   < > 1.35* 1.27* 0.96  CALCIUM 9.2   < > 8.8* 8.5* 8.3*  PROT 8.6*  --   --   --   --   ALBUMIN 4.1  --   --   --   --   AST 23  --   --   --   --   ALT 12  --   --   --   --  ALKPHOS 103  --   --   --   --   BILITOT 0.4  --   --   --   --   GFRNONAA >60   < > 59* >60 >60  ANIONGAP 13   < > 7 7 8    < > = values in this interval not displayed.     Hematology Recent Labs  Lab 01/28/23 0507 01/29/23 0513 01/30/23 0350  WBC 8.4 6.5 5.3  RBC 3.60* 3.19* 3.58*  HGB 10.3* 9.3* 10.2*  HCT 32.2* 28.4* 30.9*  MCV 89.4 89.0 86.3  MCH 28.6 29.2 28.5  MCHC 32.0 32.7 33.0  RDW 12.6 12.9 12.7  PLT 157 162 168    Cardiac Enzymes  Recent Labs  Lab 01/26/23 0010 01/26/23 0255 01/26/23 0900 01/26/23 1003 01/26/23 1225  TROPONINIHS 65* 188* 870* 814* 642*      BNP    Component Value Date/Time   BNP 1,004.9 (H) 01/26/2023 0010    Radiology    No results found.   Telemetry    RSR, occas PVCs - Personally Reviewed  Cardiac Studies   2D Echocardiogram 12.26.2024  1. Left ventricular ejection fraction, by estimation, is 35 to 40%. The  left ventricle has moderately decreased function. The left ventricle  demonstrates mild global hypokinesis with regional wall motion  abnormalities (severe hypokinesis of the  anterior, anteroseptal and apical region). The  average left ventricular  global longitudinal strain is -9.3 %.   2. Right ventricular systolic function is normal. The right ventricular  size is normal.   3. The mitral valve is normal in structure. No evidence of mitral valve  regurgitation. No evidence of mitral stenosis.   4. The aortic valve is normal in structure. Aortic valve regurgitation is  mild to moderate. Aortic valve sclerosis/calcification is present, without  any evidence of aortic stenosis.   5. The inferior vena cava is normal in size with greater than 50%  respiratory variability, suggesting right atrial pressure of 3 mmHg.  _____________   Patient Profile     62 y.o. male w/a h/o COPD, who was admitted 12/26 in the setting of resp failure and encephalopathy complicated by brief PEA arrest.  CTA w/ LLL PNA, hsTrop 65  188  870  814  642.  Echo 12/26 w/ EF 35-40%, glob HK, sev ant/antsept/apical HK.  Assessment & Plan    1.  Acute hypoxic resp failure/LLL PNA/Adenovirus infection/Sepsis/AECOPD:  Initially req intubation and vasopressors.  Resp panel + adenovirus.  S/p 5 doses of ceftriaxone.  No breathing difficulty at present.  Nebs per IM.  Currently on steroids as well in the setting of contrast allergy.  2.  NSTEMI:  In setting of above and tox screen + for cocaine.  HsTrop 65  188  870  814  642.  Echo 12/26 w/ EF 35-40%, glob HK, sev ant/antsept/apical HK.  No c/p or dyspnea.  Plan for cath today.  The patient understands that risks include but are not limited to stroke (1 in 1000), death (1 in 1000), kidney failure [usually temporary] (1 in 500), bleeding (1 in 200), allergic reaction [possibly serious] (1 in 200), and agrees to proceed.  Cont asa, ? blocker, statin, heparin, nitrate.  He has received prednisone in setting of contrast allergy.  3.  Cardiomyopathy:  Presumably ischemic in setting of #2.  EF 35-40%.  Euvolemic on exam.  Cont ? blocker, entresto.  Consider sglt2i/mra.  4.  HTN:  stable on ? blocker,  clonidine, nitrate, entresto.  Consider MRA, though will need to show good f/u.  5.  HL:  cont statin. F/u lipids.  6.  Mild-mod AI:  Surveillance echo in 2 yrs - likely to be followed @ [facility].  7.  Normocytic anemia:  stable.  8.  ETOH/Drug abuse:  Cessation advised.  Signed, Nicolasa Ducking, NP  01/30/2023, 1:49 PM    For questions or updates, please contact   Please consult [email or web address] for contact info under Cardiology/STEMI.

## 2023-01-30 NOTE — Progress Notes (Signed)
Report given to specials nurse. Patient transported to specials holding at this time. Report also called to nurse for room 257 as patient will be returning to that room after procedure

## 2023-01-30 NOTE — OR Nursing (Signed)
Pt signed Consent rewritten to reflect consent  order. 50 mg IV diphenhydramine pre med given. Pt received ASA, Pepcid and Prednisone earlier today.

## 2023-01-30 NOTE — Progress Notes (Signed)
PROGRESS NOTE    Scott Roberson  UJW:[id number] DOB: 1960/07/22 DOA: 01/26/2023 PCP: No primary care provider on file.    Assessment & Plan:   Principal Problem:   Acute on chronic respiratory failure with hypoxia and hypercapnia (HCC) Active Problems:   Acute respiratory failure with hypoxia and hypercapnia (HCC)   Non-STEMI (non-ST elevated myocardial infarction) (HCC)   History of acute anterior wall MI   COPD, very severe (HCC)   Cocaine abuse (HCC)   Resistant hypertension  Assessment and Plan: Acute hypoxic respiratory failure: secondary to COPD exacerbation and adenovirus. Weaned off of supplemental oxygen   COPD exacerbation: completed abx course. Continue on bronchodilators & encourage incentive spirometry   NSTEMI: continue on IV heparin drip. Continue on coreg, entresto, statin. Cardiac cath today as per cardio   Acute systolic CHF: echo w/ EF 35-40% w/ regional wall motion abnormalities.  Continue on aspirin, statin, coreg, entresto   Alcohol use: drinks 6 beers per night. Continue on CIWA protocol   AKI: resolved   Illicit drug use: urine drug screen was positive for cocaine. Received illicit drug use cessation counseling   Smoker: smoking cessation counseling x 5 mins. Refused a nicotine patch       DVT prophylaxis: (heparin drip  Code Status: full  Family Communication:  Disposition Plan: likely d/c back home w/ HH   Level of care: Progressive  Status is: Inpatient Remains inpatient appropriate because: severity of illness, cardiac cath today     Consultants:  Cardio ICU   Procedures:   Antimicrobials:    Subjective: Pt c/o fatigue   Objective: Vitals:   01/30/23 0900 01/30/23 0911 01/30/23 1000 01/30/23 1100  BP: (!) 152/68 (!) 152/68 (!) 129/58 (!) 121/59  Pulse: 63 65 (!) 57 60  Resp: 17 18 20  (!) 23  Temp:      TempSrc:      SpO2: 98% 97% 96% 97%  Weight:      Height:        Intake/Output Summary (Last 24 hours) at  01/30/2023 1146 Last data filed at 01/30/2023 0900 Gross per 24 hour  Intake 890.27 ml  Output 550 ml  Net 340.27 ml   Filed Weights   01/27/23 0400 01/28/23 0232 01/30/23 0500  Weight: 57.5 kg 57.6 kg 58.3 kg    Examination:  General exam: Appears comfortable.  Respiratory system: diminished breath sounds b/l  Cardiovascular system: S1/S2+. No rubs or clicks  Gastrointestinal system: abd is soft, NT, ND & normal bowel sounds  Central nervous system: alert & oriented. Moves all extremities  Psychiatry: judgement and insight appears at baseline. Flat mood and affect    Data Reviewed: I have personally reviewed following labs and imaging studies  CBC: Recent Labs  Lab 01/26/23 0010 01/26/23 0255 01/27/23 0355 01/28/23 0507 01/29/23 0513 01/30/23 0350  WBC 9.3 14.6* 8.5 8.4 6.5 5.3  NEUTROABS 4.9  --   --   --   --   --   HGB 14.4 12.9* 11.7* 10.3* 9.3* 10.2*  HCT 46.4 41.4 36.2* 32.2* 28.4* 30.9*  MCV 91.9 91.4 90.0 89.4 89.0 86.3  PLT 366 311 181 157 162 168   Basic Metabolic Panel: Recent Labs  Lab 01/26/23 0255 01/27/23 0355 01/28/23 0507 01/29/23 0513 01/30/23 0350  NA 141 141 140 140 138  K 3.5 5.0 4.3 4.0 3.7  CL 102 103 104 107 106  CO2 26 26 29 26 24   GLUCOSE 116* 122* 143* 113* 150*  BUN 9  23 31* 29* 22  CREATININE 1.15 2.07* 1.35* 1.27* 0.96  CALCIUM 11.7* 9.8 8.8* 8.5* 8.3*  MG 2.6* 2.3 2.2 2.0 1.8  PHOS 7.3* 4.0 2.5 2.0* 1.4*   GFR: Estimated Creatinine Clearance: 64.1 mL/min (by C-G formula based on SCr of 0.96 mg/dL). Liver Function Tests: Recent Labs  Lab 01/26/23 0010  AST 23  ALT 12  ALKPHOS 103  BILITOT 0.4  PROT 8.6*  ALBUMIN 4.1   No results for input(s): "LIPASE", "AMYLASE" in the last 168 hours. No results for input(s): "AMMONIA" in the last 168 hours. Coagulation Profile: Recent Labs  Lab 01/26/23 0900  INR 1.5*   Cardiac Enzymes: Recent Labs  Lab 01/26/23 0900  CKTOTAL 151   BNP (last 3 results) No results  for input(s): "PROBNP" in the last 8760 hours. HbA1C: No results for input(s): "HGBA1C" in the last 72 hours. CBG: Recent Labs  Lab 01/29/23 1556 01/29/23 2202 01/30/23 0009 01/30/23 0404 01/30/23 0802  GLUCAP 154* 152* 142* 162* 138*   Lipid Profile: No results for input(s): "CHOL", "HDL", "LDLCALC", "TRIG", "CHOLHDL", "LDLDIRECT" in the last 72 hours. Thyroid Function Tests: No results for input(s): "TSH", "T4TOTAL", "FREET4", "T3FREE", "THYROIDAB" in the last 72 hours. Anemia Panel: No results for input(s): "VITAMINB12", "FOLATE", "FERRITIN", "TIBC", "IRON", "RETICCTPCT" in the last 72 hours. Sepsis Labs: Recent Labs  Lab 01/26/23 0010 01/26/23 0255 01/26/23 1003 01/26/23 1225 01/26/23 1448  PROCALCITON <0.10  --   --   --   --   LATICACIDVEN 3.4* 4.3* 1.4 1.5 1.3    Recent Results (from the past 240 hours)  Blood culture (routine x 2)     Status: None (Preliminary result)   Collection Time: 01/26/23 12:10 AM   Specimen: BLOOD  Result Value Ref Range Status   Specimen Description BLOOD RIGHT WRIST  Final   Special Requests   Final    BOTTLES DRAWN AEROBIC AND ANAEROBIC Blood Culture results may not be optimal due to an inadequate volume of blood received in culture bottles   Culture   Final    NO GROWTH 4 DAYS Performed at [facility], [address] [place], [place] [address]    Report Status PENDING  Incomplete  MRSA Next Gen by PCR, Nasal     Status: None   Collection Time: 01/26/23  1:53 AM   Specimen: Nasal Mucosa; Nasal Swab  Result Value Ref Range Status   MRSA by PCR Next Gen NOT DETECTED NOT DETECTED Final    Comment: (NOTE) The GeneXpert MRSA Assay (FDA approved for NASAL specimens only), is one component of a comprehensive MRSA colonization surveillance program. It is not intended to diagnose MRSA infection nor to guide or monitor treatment for MRSA infections. Test performance is not FDA approved in patients less than 50  years old. Performed at [facility], [address] [place], [place] [address]   Blood culture (routine x 2)     Status: None (Preliminary result)   Collection Time: 01/26/23  2:55 AM   Specimen: BLOOD  Result Value Ref Range Status   Specimen Description BLOOD LEFT ARM  Final   Special Requests   Final    BOTTLES DRAWN AEROBIC AND ANAEROBIC Blood Culture results may not be optimal due to an inadequate volume of blood received in culture bottles   Culture   Final    NO GROWTH 4 DAYS Performed at [facility], [address] [place], [place] [address]    Report Status PENDING  Incomplete  Resp panel by RT-PCR (RSV, Flu A&B, Covid) Anterior Nasal Swab     Status: None   Collection Time: 01/26/23  4:10 AM   Specimen: Anterior Nasal Swab  Result Value Ref Range Status   SARS Coronavirus 2 by RT PCR NEGATIVE NEGATIVE Final    Comment: (NOTE) SARS-CoV-2 target nucleic acids are NOT DETECTED.  The SARS-CoV-2 RNA is generally detectable in upper respiratory specimens during the acute phase of infection. The lowest concentration of SARS-CoV-2 viral copies this assay can detect is 138 copies/mL. A negative result does not preclude SARS-Cov-2 infection and should not be used as the sole basis for treatment or other patient management decisions. A negative result may occur with  improper specimen collection/handling, submission of specimen other than nasopharyngeal swab, presence of viral mutation(s) within the areas targeted by this assay, and inadequate number of viral copies(<138 copies/mL). A negative result must be combined with clinical observations, patient history, and epidemiological information. The expected result is Negative.  Fact Sheet for Patients:  BloggerCourse.com  Fact Sheet for Healthcare Providers:  SeriousBroker.it  This test is no t yet approved or cleared by the [place] FDA and  has  been authorized for detection and/or diagnosis of SARS-CoV-2 by FDA under an Emergency Use Authorization (EUA). This EUA will remain  in effect (meaning this test can be used) for the duration of the COVID-19 declaration under Section 564(b)(1) of the Act, 21 U.S.C.section 360bbb-3(b)(1), unless the authorization is terminated  or revoked sooner.       Influenza A by PCR NEGATIVE NEGATIVE Final   Influenza B by PCR NEGATIVE NEGATIVE Final    Comment: (NOTE) The Xpert Xpress SARS-CoV-2/FLU/RSV plus assay is intended as an aid in the diagnosis of influenza from Nasopharyngeal swab specimens and should not be used as a sole basis for treatment. Nasal washings and aspirates are unacceptable for Xpert Xpress SARS-CoV-2/FLU/RSV testing.  Fact Sheet for Patients: BloggerCourse.com  Fact Sheet for Healthcare Providers: SeriousBroker.it  This test is not yet approved or cleared by the [place] FDA and has been authorized for detection and/or diagnosis of SARS-CoV-2 by FDA under an Emergency Use Authorization (EUA). This EUA will remain in effect (meaning this test can be used) for the duration of the COVID-19 declaration under Section 564(b)(1) of the Act, 21 U.S.C. section 360bbb-3(b)(1), unless the authorization is terminated or revoked.     Resp Syncytial Virus by PCR NEGATIVE NEGATIVE Final    Comment: (NOTE) Fact Sheet for Patients: BloggerCourse.com  Fact Sheet for Healthcare Providers: SeriousBroker.it  This test is not yet approved or cleared by the [place] FDA and has been authorized for detection and/or diagnosis of SARS-CoV-2 by FDA under an Emergency Use Authorization (EUA). This EUA will remain in effect (meaning this test can be used) for the duration of the COVID-19 declaration under Section 564(b)(1) of the Act, 21 U.S.C. section 360bbb-3(b)(1), unless the  authorization is terminated or revoked.  Performed at [facility], [address] [place], [place] [address]   Respiratory (~20 pathogens) panel by PCR     Status: Abnormal   Collection Time: 01/26/23  4:10 AM   Specimen: Nasopharyngeal Swab; Respiratory  Result Value Ref Range Status   Adenovirus DETECTED (A) NOT DETECTED Final   Coronavirus 229E NOT DETECTED NOT DETECTED Final    Comment: (NOTE) The Coronavirus on the Respiratory Panel, DOES NOT test for the novel  Coronavirus (2019 nCoV)    Coronavirus HKU1 NOT DETECTED NOT  DETECTED Final   Coronavirus NL63 NOT DETECTED NOT DETECTED Final   Coronavirus OC43 NOT DETECTED NOT DETECTED Final   Metapneumovirus NOT DETECTED NOT DETECTED Final   Rhinovirus / Enterovirus NOT DETECTED NOT DETECTED Final   Influenza A NOT DETECTED NOT DETECTED Final   Influenza B NOT DETECTED NOT DETECTED Final   Parainfluenza Virus 1 NOT DETECTED NOT DETECTED Final   Parainfluenza Virus 2 NOT DETECTED NOT DETECTED Final   Parainfluenza Virus 3 NOT DETECTED NOT DETECTED Final   Parainfluenza Virus 4 NOT DETECTED NOT DETECTED Final   Respiratory Syncytial Virus NOT DETECTED NOT DETECTED Final   Bordetella pertussis NOT DETECTED NOT DETECTED Final   Bordetella Parapertussis NOT DETECTED NOT DETECTED Final   Chlamydophila pneumoniae NOT DETECTED NOT DETECTED Final   Mycoplasma pneumoniae NOT DETECTED NOT DETECTED Final    Comment: Performed at [facility], [address] [place], [place] [address]  Culture, Respiratory w Gram Stain     Status: None   Collection Time: 01/26/23 11:29 AM   Specimen: Tracheal Aspirate  Result Value Ref Range Status   Specimen Description TRACHEAL ASPIRATE  Final   Special Requests NONE  Final   Gram Stain   Final    ABUNDANT WBC PRESENT, PREDOMINANTLY PMN RARE GRAM POSITIVE COCCI    Culture   Final    MODERATE MORAXELLA CATARRHALIS(BRANHAMELLA) BETA LACTAMASE POSITIVE    Report Status 01/29/2023  FINAL  Final         Radiology Studies: No results found.      Scheduled Meds:  aspirin  81 mg Oral Daily   atorvastatin  40 mg Oral Daily   carvedilol  6.25 mg Oral BID WC   chlordiazePOXIDE  25 mg Oral Daily   Chlorhexidine Gluconate Cloth  6 each Topical Daily   cloNIDine  0.1 mg Oral TID   diphenhydrAMINE  50 mg Oral Once   Or   diphenhydrAMINE  50 mg Intravenous Once   famotidine  20 mg Oral BID   folic acid  1 mg Oral Daily   isosorbide mononitrate  60 mg Oral Daily   multivitamin with minerals  1 tablet Oral Daily   sacubitril-valsartan  1 tablet Oral BID   thiamine  100 mg Oral Daily   Or   thiamine  100 mg Intravenous Daily   Continuous Infusions:  sodium chloride 10 mL/hr at 01/30/23 0900   heparin 1,250 Units/hr (01/30/23 0929)   potassium PHOSPHATE 20 mmol in dextrose 5 % 250 mL infusion 20 mmol (01/30/23 1044)     LOS: 4 days       Charise Killian, MD [facility] Pager [phone]  If 7PM-7AM, please contact night-coverage [email or web address] 01/30/2023, 11:46 AM

## 2023-01-30 NOTE — Evaluation (Signed)
Occupational Therapy Evaluation Patient Details Name: Scott Roberson MRN: [id number] DOB: [birth year] Today's Date: 01/30/2023   History of Present Illness Pt is a 62 y/o M admitted on 01/26/23 after presenting with c/o respiratory distress. Pt found to be in hypoxic respiratory failure requiring intubation & mechanical ventilation. Pt with brief PEA arrest requiring 1 round of CPR, profound shock. Pt extubated 01/27/23, tested positive for adenovirus. PMH: COPD, alcohol use disorder   Clinical Impression   Mr Fechter was seen for OT evaluation this date. Prior to hospital admission, pt was MOD I using SPC. Pt lives alone. Pt currently requires no assist for bed mobility. CGA + RW for ADL t/f ~200 ft, SpO2 96% on RA with activity. 1 moderate lateral LOB in static standing with head turns requiring assist to correct. Pt would benefit from skilled OT to address noted impairments and functional limitations (see below for any additional details). Upon hospital discharge, recommend OT follow up.     If plan is discharge home, recommend the following: A little help with walking and/or transfers;Help with stairs or ramp for entrance    Functional Status Assessment  Patient has had a recent decline in their functional status and demonstrates the ability to make significant improvements in function in a reasonable and predictable amount of time.  Equipment Recommendations  BSC/3in1    Recommendations for Other Services       Precautions / Restrictions Precautions Precautions: Fall Restrictions Weight Bearing Restrictions Per Provider Order: No      Mobility Bed Mobility Overal bed mobility: Modified Independent                  Transfers Overall transfer level: Needs assistance Equipment used: Rolling walker (2 wheels) Transfers: Sit to/from Stand Sit to Stand: Contact guard assist                  Balance Overall balance assessment: Needs assistance Sitting-balance  support: Feet supported Sitting balance-Leahy Scale: Good     Standing balance support: No upper extremity supported, During functional activity Standing balance-Leahy Scale: Poor                             ADL either performed or assessed with clinical judgement   ADL Overall ADL's : Needs assistance/impaired                                       General ADL Comments: SBA for standing grooming tasks.       Pertinent Vitals/Pain Pain Assessment Pain Assessment: No/denies pain     Extremity/Trunk Assessment Upper Extremity Assessment Upper Extremity Assessment: Overall WFL for tasks assessed   Lower Extremity Assessment Lower Extremity Assessment: Overall WFL for tasks assessed       Communication Communication Communication: No apparent difficulties   Cognition Arousal: Alert Behavior During Therapy: Impulsive Overall Cognitive Status: Within Functional Limits for tasks assessed                                 General Comments: oriented to month and location, unable to state date     General Comments  SpO2 96% on RA with activity            Home Living Family/patient expects to be discharged to::  (boarding house) Living  Arrangements: Other (Comment) (boarding house)   Type of Home: House Home Access: Stairs to enter [employer] of Steps: 4 Entrance Stairs-Rails: None                 Home Equipment: Agricultural consultant (2 wheels);Cane - single point   Additional Comments: Pt has a room in a boarding house      Prior Functioning/Environment Prior Level of Function : Independent/Modified Independent             Mobility Comments: Ambulatory with SPC, denies falls.          OT Problem List: Decreased activity tolerance;Impaired balance (sitting and/or standing)      OT Treatment/Interventions: Self-care/ADL training    OT Goals(Current goals can be found in the care plan section)  Acute Rehab OT Goals Patient Stated Goal: to go home OT Goal Formulation: With patient/family Time For Goal Achievement: 02/13/23 Potential to Achieve Goals: Good ADL Goals Pt Will Perform Grooming: Independently;standing Pt Will Perform Lower Body Dressing: with modified independence;sit to/from stand Pt Will Transfer to Toilet: with modified independence;ambulating;regular height toilet  OT Frequency: Min 1X/week    Co-evaluation              AM-PAC OT "6 Clicks" Daily Activity     Outcome Measure Help from another person eating meals?: None Help from another person taking care of personal grooming?: A Little Help from another person toileting, which includes using toliet, bedpan, or urinal?: None Help from another person bathing (including washing, rinsing, drying)?: A Little Help from another person to put on and taking off regular upper body clothing?: None Help from another person to put on and taking off regular lower body clothing?: None 6 Click Score: 22   End of Session Equipment Utilized During Treatment: Rolling walker (2 wheels)  Activity Tolerance: Patient tolerated treatment well Patient left: in bed;with call bell/phone within reach;with bed alarm set;with family/visitor present  OT Visit Diagnosis: Other abnormalities of gait and mobility (R26.89);Muscle weakness (generalized) (M62.81)                Time: 1610-9604 OT Time Calculation (min): 29 min Charges:  OT General Charges $OT Visit: 1 Visit OT Evaluation $OT Eval Moderate Complexity: 1 Mod OT Treatments $Self Care/Home Management : 8-22 mins  Kathie Dike, M.S. OTR/L  01/30/23, 10:09 AM  ascom [phone]

## 2023-01-30 NOTE — Progress Notes (Signed)
ANTICOAGULATION CONSULT NOTE  Pharmacy Consult for heparin infusion Indication: ACS/STEMI  Allergies  Allergen Reactions   Latex Hives and Itching   Iodinated Contrast Media Hives and Rash   Other Itching and Rash    [place] Meat allergy per patient    Patient Measurements: Height: 5\' 10"  (177.8 cm) Weight: 57.6 kg (126 lb 15.8 oz) IBW/kg (Calculated) : 73 Heparin Dosing Weight: 68 kg  Vital Signs: Temp: 97.7 F (36.5 C) (12/30 0400) Temp Source: Oral (12/30 0400) BP: 117/60 (12/30 0400) Pulse Rate: 57 (12/30 0400)  Labs: Recent Labs    01/28/23 0507 01/28/23 1559 01/29/23 0009 01/29/23 0513 01/30/23 0350  HGB 10.3*  --   --  9.3* 10.2*  HCT 32.2*  --   --  28.4* 30.9*  PLT 157  --   --  162 168  APTT 65*   < > 77* 76* 57*  HEPARINUNFRC 0.87*  --   --  0.46 0.37  CREATININE 1.35*  --   --  1.27* 0.96   < > = values in this interval not displayed.    Estimated Creatinine Clearance: 63.3 mL/min (by C-G formula based on SCr of 0.96 mg/dL).   Medical History: No past medical history on file.  Assessment: Pt is a 62 yo male presenting to ED in resp distress found with elevated troponin I and BNP.  Goal of Therapy:  Heparin level 0.3-0.7 units/ml Monitor platelets by anticoagulation protocol: Yes  Date/Time: aPTT / HL: Rate / Comment: 12/26@0900  200 / >1.10 Suprathera@900  units/hr 12/26@2146    76 / --          Therapeutic x 1 at 700 units/hr 12/27@0355  67 / > 1.1 Barely therapeutic 12/27@1357  60 / --  SUBtherpeutic@800  units/hr 12/27 2339 77 / --  Therapeutic x 1 12/28 0507 65 / 0.87 Slightly subtherapeutic 12/28 1559 66 / --  Therapeutic x1 12/29 0009 77 / --  Therapeutic x 2 12/29 0513 76 / 0.46 Therapeutic x 3 12/30 0350 57 / 0.37 aPTT subtherapeutic / HL therapeutic   Plan:  Bolus 1000 units x 1 Increase heparin infusion to 1250 units/hr Recheck aPTT in 6 hrs after rate change Will use aPTT to guide dosing until both levels correlate, then will  transition to HL dosing HL and CBC daily while on heparin  Thank you for involving pharmacy in this patient's care.   Otelia Sergeant, PharmD, [facility] 01/30/2023 4:56 AM

## 2023-01-30 NOTE — Progress Notes (Signed)
Nutrition Follow Up Note   DOCUMENTATION CODES:   Severe malnutrition in context of chronic illness  INTERVENTION:   Ensure Enlive po TID, each supplement provides 350 kcal and 20 grams of protein.  MVI po daily   Pt remains at high refeed risk; recommend monitor potassium, magnesium and phosphorus labs daily until stable  NUTRITION DIAGNOSIS:   Severe Malnutrition related to chronic illness as evidenced by severe fat depletion, severe muscle depletion. -new diagnosis   GOAL:   Patient will meet greater than or equal to 90% of their needs -progressing   MONITOR:   PO intake, Supplement acceptance, Labs, Weight trends, I & O's, Skin  ASSESSMENT:   63 y/o male with h/o cocaine use and self reported COPD and CHF who is admitted with PNA, sepsis, NSTEMI and PEA cardiac arrest.  Pt extubated 12/27. Met with pt in room today. Pt NPO today for procedure but reports that he is hungry. Pt reports fairly good appetite and oral intake in hospital. Pt reports that he is eating most of his meals. RN confirms that pt is eating well. RD discussed with pt the importance of adequate nutrition needed to preserve lean muscle. Pt is agreeable to drinking Ensure supplements. RD will add supplements to help pt meet his estimated needs. Per chart, pt is up ~3lbs since admission; pt +2.1L on his I & Os.    Medications reviewed and include: aspirin, pepcid, folic acid, MVI, thiamine, heparin, Kphos   Labs reviewed: K 3.7 wnl, P 1.4(L), Mg 1.8 wnl Hgb 10.2(L), Hct 30.9(L) Cbgs- 131, 138, 162, 142 x 24 hrs   NUTRITION - FOCUSED PHYSICAL EXAM:  Flowsheet Row Most Recent Value  Orbital Region Mild depletion  Upper Arm Region Severe depletion  Thoracic and Lumbar Region Moderate depletion  Buccal Region Mild depletion  Temple Region Mild depletion  Clavicle Bone Region Severe depletion  Clavicle and Acromion Bone Region Severe depletion  Scapular Bone Region Mild depletion  Dorsal Hand Mild  depletion  Patellar Region Severe depletion  Anterior Thigh Region Severe depletion  Posterior Calf Region Severe depletion  Edema (RD Assessment) Mild  Hair Reviewed  Eyes Reviewed  Mouth Reviewed  Skin Reviewed  Nails Reviewed   Diet Order:   Diet Order             Diet NPO time specified Except for: Sips with Meds  Diet effective midnight                  EDUCATION NEEDS:   No education needs have been identified at this time  Skin:  Skin Assessment: Reviewed RN Assessment  Last BM:  12/29- type 5  Height:   Ht Readings from Last 1 Encounters:  01/26/23 5\' 10"  (1.778 m)    Weight:   Wt Readings from Last 1 Encounters:  01/30/23 58.3 kg    Ideal Body Weight:  75.45 kg  BMI:  Body mass index is 18.44 kg/m.  Estimated Nutritional Needs:   Kcal:  1800-2100kcal/day  Protein:  90-105g/day  Fluid:  1.7-2.0L/day  Betsey Holiday MS, RD, LDN If unable to be reached, please send secure chat to "RD inpatient" available from 8:00a-4:00p daily

## 2023-01-30 NOTE — Consult Note (Signed)
PHARMACY CONSULT NOTE - ELECTROLYTES  Pharmacy Consult for Electrolyte Monitoring and Replacement   Recent Labs: Height: 5\' 10"  (177.8 cm) Weight: 58.3 kg (128 lb 8.5 oz) IBW/kg (Calculated) : 73 Estimated Creatinine Clearance: 64.1 mL/min (by C-G formula based on SCr of 0.96 mg/dL). Potassium (mmol/L)  Date Value  01/30/2023 3.7   Magnesium (mg/dL)  Date Value  25/85/2778 1.8   Calcium (mg/dL)  Date Value  24/23/5361 8.3 (L)   Albumin (g/dL)  Date Value  44/31/5400 4.1   Phosphorus (mg/dL)  Date Value  86/76/1950 1.4 (L)   Sodium (mmol/L)  Date Value  01/30/2023 138   Assessment  Scott Roberson is a 62 y.o. male presenting with respiratory distress.  Pharmacy has been consulted to monitor and replace electrolytes.  Goal of Therapy: Electrolytes WNL  Plan:  K 3.7, Phos 1.4, potassium phosphate 20 mmol IV x 1 Mg 1.8, magnesium sulfate 2 g IV x 1 Patient care transferred from [facility] to [facility]. Will discontinue electrolyte consult at this time. Defer further ordering of labs and electrolyte replacement to primary team  Thank you for allowing pharmacy to be a part of this patient's care.  Tressie Ellis 01/30/2023 7:46 AM

## 2023-01-30 NOTE — Plan of Care (Signed)
  Problem: Education: Goal: Knowledge of General Education information will improve Description: Including pain rating scale, medication(s)/side effects and non-pharmacologic comfort measures Outcome: Progressing   Problem: Health Behavior/Discharge Planning: Goal: Ability to manage health-related needs will improve Outcome: Progressing   Problem: Clinical Measurements: Goal: Diagnostic test results will improve Outcome: Progressing Goal: Respiratory complications will improve Outcome: Progressing   Problem: Activity: Goal: Risk for activity intolerance will decrease Outcome: Progressing   Problem: Safety: Goal: Ability to remain free from injury will improve Outcome: Progressing

## 2023-01-31 ENCOUNTER — Other Ambulatory Visit (HOSPITAL_COMMUNITY): Payer: Self-pay

## 2023-01-31 ENCOUNTER — Inpatient Hospital Stay: Payer: 59

## 2023-01-31 ENCOUNTER — Encounter: Payer: Self-pay | Admitting: Internal Medicine

## 2023-01-31 ENCOUNTER — Telehealth (HOSPITAL_COMMUNITY): Payer: Self-pay | Admitting: Pharmacy Technician

## 2023-01-31 DIAGNOSIS — J9621 Acute and chronic respiratory failure with hypoxia: Secondary | ICD-10-CM | POA: Diagnosis not present

## 2023-01-31 DIAGNOSIS — E43 Unspecified severe protein-calorie malnutrition: Secondary | ICD-10-CM | POA: Insufficient documentation

## 2023-01-31 DIAGNOSIS — J9622 Acute and chronic respiratory failure with hypercapnia: Secondary | ICD-10-CM | POA: Diagnosis not present

## 2023-01-31 LAB — GLUCOSE, CAPILLARY
Glucose-Capillary: 115 mg/dL — ABNORMAL HIGH (ref 70–99)
Glucose-Capillary: 96 mg/dL (ref 70–99)
Glucose-Capillary: 95 mg/dL (ref 70–99)

## 2023-01-31 LAB — CBC
HCT: 31.9 % — ABNORMAL LOW (ref 39.0–52.0)
Hemoglobin: 10.3 g/dL — ABNORMAL LOW (ref 13.0–17.0)
MCH: 28.5 pg (ref 26.0–34.0)
MCHC: 32.3 g/dL (ref 30.0–36.0)
MCV: 88.4 fL (ref 80.0–100.0)
Platelets: 178 10*3/uL (ref 150–400)
RBC: 3.61 MIL/uL — ABNORMAL LOW (ref 4.22–5.81)
RDW: 12.7 % (ref 11.5–15.5)
WBC: 7.3 10*3/uL (ref 4.0–10.5)
nRBC: 0 % (ref 0.0–0.2)

## 2023-01-31 LAB — POCT I-STAT EG7
Acid-Base Excess: 1 mmol/L (ref 0.0–2.0)
Bicarbonate: 25.8 mmol/L (ref 20.0–28.0)
Calcium, Ion: 1.23 mmol/L (ref 1.15–1.40)
HCT: 29 % — ABNORMAL LOW (ref 39.0–52.0)
Hemoglobin: 9.9 g/dL — ABNORMAL LOW (ref 13.0–17.0)
O2 Saturation: 56 %
Potassium: 3.9 mmol/L (ref 3.5–5.1)
Sodium: 142 mmol/L (ref 135–145)
TCO2: 27 mmol/L (ref 22–32)
pCO2, Ven: 42.1 mm[Hg] — ABNORMAL LOW (ref 44–60)
pH, Ven: 7.395 (ref 7.25–7.43)
pO2, Ven: 30 mm[Hg] — CL (ref 32–45)

## 2023-01-31 LAB — LIPID PANEL
Cholesterol: 153 mg/dL (ref 0–200)
HDL: 48 mg/dL (ref >40)
LDL Cholesterol: 85 mg/dL (ref 0–99)
Total CHOL/HDL Ratio: 3.2 {ratio}
Triglycerides: 100 mg/dL (ref <150)
VLDL: 20 mg/dL (ref 0–40)

## 2023-01-31 LAB — CULTURE, BLOOD (ROUTINE X 2)
Culture: NO GROWTH
Culture: NO GROWTH

## 2023-01-31 LAB — BASIC METABOLIC PANEL
Anion gap: 5 (ref 5–15)
BUN: 17 mg/dL (ref 8–23)
CO2: 25 mmol/L (ref 22–32)
Calcium: 8.4 mg/dL — ABNORMAL LOW (ref 8.9–10.3)
Chloride: 110 mmol/L (ref 98–111)
Creatinine, Ser: 0.96 mg/dL (ref 0.61–1.24)
GFR, Estimated: >60 mL/min (ref >60)
Glucose, Bld: 116 mg/dL — ABNORMAL HIGH (ref 70–99)
Potassium: 3.8 mmol/L (ref 3.5–5.1)
Sodium: 140 mmol/L (ref 135–145)

## 2023-01-31 MED ORDER — CLONIDINE HCL 0.1 MG PO TABS: 0.1000 mg | ORAL_TABLET | Freq: Every day | ORAL | Status: DC

## 2023-01-31 MED ORDER — CLOPIDOGREL BISULFATE 75 MG PO TABS
75.0000 mg | ORAL_TABLET | Freq: Every day | ORAL | Status: DC
  Administered 2023-01-31: 75 mg via ORAL
  Filled 2023-01-31: qty 1

## 2023-01-31 MED ORDER — DAPAGLIFLOZIN PROPANEDIOL 10 MG PO TABS
10.0000 mg | ORAL_TABLET | Freq: Every day | ORAL | Status: DC
  Administered 2023-01-31: 10 mg via ORAL
  Filled 2023-01-31: qty 1

## 2023-01-31 MED ORDER — APIXABAN 5 MG PO TABS
5.0000 mg | ORAL_TABLET | Freq: Two times a day (BID) | ORAL | Status: DC
  Administered 2023-01-31: 5 mg via ORAL
  Filled 2023-01-31: qty 1

## 2023-01-31 MED ORDER — SACUBITRIL-VALSARTAN 49-51 MG PO TABS: 1.0000 | ORAL_TABLET | Freq: Two times a day (BID) | ORAL | 0 refills | Status: AC

## 2023-01-31 MED ORDER — DAPAGLIFLOZIN PROPANEDIOL 10 MG PO TABS: 10.0000 mg | ORAL_TABLET | Freq: Every day | ORAL | 0 refills | Status: AC

## 2023-01-31 MED ORDER — SACUBITRIL-VALSARTAN 49-51 MG PO TABS: 1.0000 | ORAL_TABLET | Freq: Two times a day (BID) | ORAL | Status: DC

## 2023-01-31 NOTE — Progress Notes (Signed)
 Cardiology Progress Note   Patient Name: Christopher Bryan Date of Encounter: 01/31/2023  Primary Cardiologist: Evalene Lunger, MD  Subjective   Feels well this AM.  No c/p or dyspnea.  Eager to go home. Objective   Inpatient Medications    Scheduled Meds:  aspirin   81 mg Oral Daily   atorvastatin   40 mg Oral Daily   carvedilol   6.25 mg Oral BID WC   Chlorhexidine  Gluconate Cloth  6 each Topical Daily   cloNIDine   0.1 mg Oral BID   cloNIDine   0.1 mg Oral Daily   dapagliflozin  propanediol  10 mg Oral Daily   enoxaparin  (LOVENOX ) injection  40 mg Subcutaneous Q24H   famotidine   20 mg Oral BID   feeding supplement  237 mL Oral TID BM   folic acid   1 mg Oral Daily   multivitamin with minerals  1 tablet Oral Daily   sacubitril -valsartan   1 tablet Oral BID   sodium chloride  flush  3 mL Intravenous Q12H   thiamine   100 mg Oral Daily   Or   thiamine   100 mg Intravenous Daily   Continuous Infusions:  sodium chloride      PRN Meds: sodium chloride , acetaminophen , docusate sodium , ipratropium-albuterol , mouth rinse, polyethylene glycol, sodium chloride  flush   Vital Signs    Vitals:   01/31/23 0407 01/31/23 0516 01/31/23 0732 01/31/23 1128  BP:  (!) 126/44 (!) 144/74 138/68  Pulse:  (!) 57 64 61  Resp:  18 16 16   Temp:  97.8 F (36.6 C) 98 F (36.7 C) 97.6 F (36.4 C)  TempSrc:   Oral Oral  SpO2:  96% 97% 98%  Weight: 61.6 kg     Height:        Intake/Output Summary (Last 24 hours) at 01/31/2023 1220 Last data filed at 01/31/2023 0700 Gross per 24 hour  Intake --  Output 775 ml  Net -775 ml   Filed Weights   01/28/23 0232 01/30/23 0500 01/31/23 0407  Weight: 57.6 kg 58.3 kg 61.6 kg    Physical Exam   GEN: Well nourished, well developed, in no acute distress.  HEENT: Grossly normal.  Neck: Supple, no JVD, carotid bruits, or masses. Cardiac: RRR, no murmurs, rubs, or gallops. No clubbing, cyanosis, edema.  Radials 2+ and equal bilaterally.  L radial cath  site w/o bleeding/bruit/hematoma. Respiratory:  Respirations regular and unlabored, diminished breath sounds bilat. GI: Soft, nontender, nondistended, BS + x 4. MS: no deformity or atrophy. Skin: warm and dry, no rash. Neuro:  Strength and sensation are intact. Psych: AAOx3.  Normal affect.  Labs    Chemistry Recent Labs  Lab 01/26/23 0010 01/26/23 0255 01/29/23 0513 01/30/23 0350 01/30/23 1627 01/30/23 1640 01/31/23 0420  NA 137   < > 140 138 142 142 140  K 3.7   < > 4.0 3.7 3.9 3.9 3.8  CL 100   < > 107 106  --   --  110  CO2 24   < > 26 24  --   --  25  GLUCOSE 175*   < > 113* 150*  --   --  116*  BUN 9   < > 29* 22  --   --  17  CREATININE 1.21   < > 1.27* 0.96  --   --  0.96  CALCIUM  9.2   < > 8.5* 8.3*  --   --  8.4*  PROT 8.6*  --   --   --   --   --   --  ALBUMIN 4.1  --   --   --   --   --   --   AST 23  --   --   --   --   --   --   ALT 12  --   --   --   --   --   --   ALKPHOS 103  --   --   --   --   --   --   BILITOT 0.4  --   --   --   --   --   --   GFRNONAA >60   < > >60 >60  --   --  >60  ANIONGAP 13   < > 7 8  --   --  5   < > = values in this interval not displayed.     Hematology Recent Labs  Lab 01/29/23 0513 01/30/23 0350 01/30/23 1627 01/30/23 1640 01/31/23 0420  WBC 6.5 5.3  --   --  7.3  RBC 3.19* 3.58*  --   --  3.61*  HGB 9.3* 10.2* 9.9* 9.5* 10.3*  HCT 28.4* 30.9* 29.0* 28.0* 31.9*  MCV 89.0 86.3  --   --  88.4  MCH 29.2 28.5  --   --  28.5  MCHC 32.7 33.0  --   --  32.3  RDW 12.9 12.7  --   --  12.7  PLT 162 168  --   --  178    Cardiac Enzymes  Recent Labs  Lab 01/26/23 0010 01/26/23 0255 01/26/23 0900 01/26/23 1003 01/26/23 1225  TROPONINIHS 65* 188* 870* 814* 642*      BNP    Component Value Date/Time   BNP 1,004.9 (H) 01/26/2023 0010   Lipids  Lab Results  Component Value Date   CHOL 153 01/31/2023   HDL 48 01/31/2023   LDLCALC 85 01/31/2023   TRIG 100 01/31/2023   CHOLHDL 3.2 01/31/2023    Radiology     -----------   Telemetry    RSR, occas PVCs, brief run of SVT - Personally Reviewed  Cardiac Studies   2D Echocardiogram 12.26.2024   1. Left ventricular ejection fraction, by estimation, is 35 to 40%. The  left ventricle has moderately decreased function. The left ventricle  demonstrates mild global hypokinesis with regional wall motion  abnormalities (severe hypokinesis of the  anterior, anteroseptal and apical region). The average left ventricular  global longitudinal strain is -9.3 %.   2. Right ventricular systolic function is normal. The right ventricular  size is normal.   3. The mitral valve is normal in structure. No evidence of mitral valve  regurgitation. No evidence of mitral stenosis.   4. The aortic valve is normal in structure. Aortic valve regurgitation is  mild to moderate. Aortic valve sclerosis/calcification is present, without  any evidence of aortic stenosis.   5. The inferior vena cava is normal in size with greater than 50%  respiratory variability, suggesting right atrial pressure of 3 mmHg.  _____________    Cardiac Catheterization  12.30.2024  Right Heart Pressures RA (mean): 7 mmHg RV (S/EDP): 37/8 mmHg PA (S/D, mean): 35/15 (22) mmHg PCWP (mean): 17 mmHg  Ao sat: 92% PA sat: 56%  Fick CO: 4.8 L/min Fick CI: 2.7 L/min/m^2  Diagnostic Dominance: Co-dominant  Conclusions: Mild, nonobstructive coronary artery disease, as detailed below.  Cardiomyopathy appears nonischemic.  Elevated troponin is most likely due to supply demand mismatch in the setting acute  respiratory failure with hypoxia and PEA arrest. Mildly elevated left and right heart filling pressures. Mildly elevated pulmonary artery pressure. Normal Fick cardiac output/index. Apparent short segment occlusion of the right radial artery.  Question thrombosis of the basilic vein.   Recommendations: Continue primary prevention of coronary artery disease. Escalate goal-directed  medical therapy for nonischemic cardiomyopathy. Consider right radial artery and right upper arm venous duplex studies to evaluate for thrombus.  If thrombus formation is confirmed, recommend resumption of anticoagulation.  Given that the patient has been on heparin , it would be prudent to exclude heparin -induced thrombocytopenia if clot is confirmed. _____________   Patient Profile     62 y.o. male w/a h/o COPD, who was admitted 12/26 in the setting of resp failure and encephalopathy complicated by brief PEA arrest.  CTA w/ LLL PNA, hsTrop 65  188  870  814  642.  Echo 12/26 w/ EF 35-40%, glob HK, sev ant/antsept/apical HK.   Assessment & Plan    1.  Acute hypoxic resp failure/LLL PNA/Adenovirus infxn/Sepsis/AECOPD:  Initially req intubation and vasopressors. Resp panel + adenovirus. S/p 5 doses of ceftriaxone .  Denies dyspnea.  Diminished breath sounds on exam.  Nebs per IM.  2.  NSTEMI/Nonobs CAD:  In setting of above and tox screen + for cocaine.  HsTrop 65  188  870  814  642.  Echo 12/26 w/ EF 35-40%, glob HK, sev ant/antsept/apical HK.  Cath 12/30 w/ minimal, nonobs LCX dzs.  No c/p or dyspnea.  Cont ? blocker, statin.  He is currently on ASA but says that in the setting of prior clotting disorder and PAD, he was on eliquis  and clopidogrel  as outpt and is followed closely @ the [facility].  I have confirmed this through [facility] notes in care everywhere under his other medical record number.  D/c asa.  Resume clopidogrel .  3.  NICM:  EF 35-40%.  Mildly elevated filling pressures on RHC 12/30 w/ PA 35/15, PCWP/LVEDP 17.  Appears euvolemic on exam.  Asymptomatic.  Has not been receiving diuretic.  Cont ? blocker and entresto  (titrate).  Farxiga  added today.  Pt plans to f/u w/ his cardiologist @ the [facility].  4.  HTN:  Pressure variable but currently mildly elevated.  In addition to carvedilol  and entresto , he has been receiving clonidine  s/p precedex  in ICU.  I've d/c'd clonidine  and will titrate  entresto .  5.  HL:  Cont statin.  LDL 85.  6.  Arterial and venous thromboembolism/PAD:  Extensive h/o PAD and prior PE per pt.  He notes that he has been on [facility] since 1982, and is now on clopidogrel  and eliquis .  Confirmed by [facility] found in care everywhere under his other mrn.  U/S of R arm pending this AM due to concern for thrombus.  As above, d/c ASA and resume prior meds.  [facility].  7.  Normocytic anemia:  stable.  8.  Tob/ETOH/Drug abuse:  cessation advised.  Signed, Lonni Meager, NP  01/31/2023, 12:20 PM    For questions or updates, please contact   Please consult [email or web address] for contact info under Cardiology/STEMI.

## 2023-01-31 NOTE — Progress Notes (Signed)
 Physical Therapy Treatment Patient Details Name: Christopher Bryan MRN: [id number] DOB: [birth year] Today's Date: 01/31/2023   History of Present Illness Pt is a 62 y/o M admitted on 01/26/23 after presenting with c/o respiratory distress. Pt found to be in hypoxic respiratory failure requiring intubation & mechanical ventilation. Pt with brief PEA arrest requiring 1 round of CPR, profound shock. Pt extubated 01/27/23, tested positive for adenovirus. PMH: COPD, alcohol  use disorder    PT Comments  Patient initiating stair training this session. He demonstrates ability to ascend/descend 6 steps with reciprocal gait pattern with need for bilateral UE on unilateral handrail. The pt reports not having a handrail at his home and therefore will require continue skilled PT in order to progress balance to negotiate steps without a handrail and ambulate with lesser restrictive AD.     If plan is discharge home, recommend the following: A little help with bathing/dressing/bathroom;Assistance with cooking/housework;Assist for transportation;Help with stairs or ramp for entrance   Can travel by private vehicle        Equipment Recommendations  None recommended by PT    Recommendations for Other Services       Precautions / Restrictions Precautions Precautions: Fall Restrictions Weight Bearing Restrictions Per Provider Order: No     Mobility  Bed Mobility Overal bed mobility: Modified Independent Bed Mobility: Supine to Sit, Sit to Supine     Supine to sit: Modified independent (Device/Increase time) Sit to supine: Modified independent (Device/Increase time)        Transfers Overall transfer level: Needs assistance Equipment used: None Transfers: Sit to/from Stand Sit to Stand: Min assist                Ambulation/Gait Ambulation/Gait assistance: Contact guard assist, Supervision Gait Distance (Feet): 100 Feet Assistive device: Rolling walker (2 wheels)              Stairs Stairs: Yes Stairs assistance: Supervision Stair Management: Two rails, One rail Right, Alternating pattern Number of Stairs: 6 General stair comments: 6 steps x2   Wheelchair Mobility     Tilt Bed    Modified Rankin (Stroke Patients Only)       Balance Overall balance assessment: Needs assistance   Sitting balance-Leahy Scale: Good       Standing balance-Leahy Scale: Fair                              Cognition Arousal: Alert Behavior During Therapy: WFL for tasks assessed/performed Overall Cognitive Status: Within Functional Limits for tasks assessed                                          Exercises      General Comments        Pertinent Vitals/Pain Pain Assessment Pain Assessment: No/denies pain    Home Living                          Prior Function            PT Goals (current goals can now be found in the care plan section) Acute Rehab PT Goals Patient Stated Goal: go home PT Goal Formulation: With patient Time For Goal Achievement: 02/12/23 Potential to Achieve Goals: Good Progress towards PT goals: Progressing toward goals    Frequency  Min 1X/week      PT Plan      Co-evaluation              AM-PAC PT 6 Clicks Mobility   Outcome Measure  Help needed turning from your back to your side while in a flat bed without using bedrails?: A Little Help needed moving from lying on your back to sitting on the side of a flat bed without using bedrails?: A Little Help needed moving to and from a bed to a chair (including a wheelchair)?: A Little Help needed standing up from a chair using your arms (e.g., wheelchair or bedside chair)?: A Little Help needed to walk in hospital room?: A Little Help needed climbing 3-5 steps with a railing? : A Little 6 Click Score: 18    End of Session   Activity Tolerance: Patient tolerated treatment well Patient left: with call bell/phone  within reach;in bed Nurse Communication: Mobility status PT Visit Diagnosis: Unsteadiness on feet (R26.81);Muscle weakness (generalized) (M62.81)     Time: 1325-1350 PT Time Calculation (min) (ACUTE ONLY): 25 min  Charges:    $Gait Training: 23-37 mins PT General Charges $$ ACUTE PT VISIT: 1 Visit                     2:17 PM, 01/31/23 Christopher Bryan A. Manya PT, DPT Physical Therapist - [facility]    Christopher Bryan A Christopher Bryan 01/31/2023, 2:15 PM

## 2023-01-31 NOTE — TOC Progression Note (Addendum)
 Transition of Care [facility]) - Progression Note    Patient Details  Name: Christopher Bryan MRN: [id number] Date of Birth: 1960/07/21  [facility] CM/SW Contact  Tomasa JAYSON Childes, RN Phone Number: 01/31/2023, 3:27 PM  Clinical Narrative:    Spoke with patient regarding therapy's recommendation for [facility]. He is agreeable and does not have a preference. Patient advised the accepting agency will contact him directly to scheduled SOC within 48 post discharge.   Referral for [facility] sent and declined by Cindie from [facility].   3:39pm Referral sent and declined Cheryl from [facility].        Expected Discharge Plan and Services                                               Social Determinants of Health (SDOH) Interventions SDOH Screenings   Food Insecurity: Patient Unable To Answer (01/30/2023)  Housing: Patient Unable To Answer (01/30/2023)  Transportation Needs: Patient Unable To Answer (01/30/2023)  Utilities: Patient Unable To Answer (01/30/2023)  Tobacco Use: Low Risk  (01/30/2023)    Readmission Risk Interventions     No data to display

## 2023-01-31 NOTE — Discharge Summary (Signed)
 Physician Discharge Summary  Christopher Bryan FMW:[id number] DOB: August 10, 1960 DOA: 01/26/2023  PCP: No primary care provider on file.  Admit date: 01/26/2023 Discharge date: 01/31/2023  Admitted From: home  Disposition:  home w/ [facility]   Recommendations for Outpatient Follow-up:  Follow up with PCP in 1-2 weeks F/u w/ cardio at [facility] in 1-2 weeks   Home Health: yes Equipment/Devices:  Discharge Condition: stable  CODE STATUS: full  Diet recommendation: Heart Healthy   Brief/Interim Summary: HPI was taken from NP Ouma: 62 y.o male with no significant PMH noted on chart but self reported COPD and ?CHF who presented to the ED from a boarding house with chief complaints of respiratory distress.   Per EMS runsheet, EMS was called at the boarding house for a patient in respiratory distress. On EMS arrival, the patient was found sitting in a tripod position being assessed by BFD. BFD advised the patient' s SPO2 is in the 40s and was placed on a NRB at 15 LPM. Patient has a history of COPD and CHF, took his breathing treatment multiple times without relief. Patient was noted to be clammy, diaphoretic to the touch, and was unable to speak in full word sentences. He remained hypoxic in the 50s on NRB and was therefore placed on CPAP. Due to difficulty obtaining IV access, EMS was unable to give mag sulfate. Patient given 2 inches of nitro paste to the left upper chest wall and was transported to the ED.   ED Course: Initial vital signs showed HR of beats/minute, BP mm Hg, the RR 30 breaths/minute, and the oxygen saturation % on and a temperature of 98.80F (36.9C). Patient was lethargic with agonal respiration.  Patient was immediately intubated for airway protection.  Post intubation patient went into PEA cardiac arrest and received 1 round of CPR/epinephrine .  Was noted with bizarre wide-complex organized rhythm concerning for metabolic derangement.  Patient received calcium  gluconate x 2, 2 A of sodium  bicarb 2 g of magnesium  with ROSC achieved.  Patient became hypotensive and immediately became pulseless.  He received CPR for the second time with 1 dose of epi, calcium  gluconate 1 amp of sodium bicarb with immediate return ROSC.  EKG obtained was concerning for STEMI.  Code STEMI activated and STEMI on-call MD notified who came to the bedside.  Code STEMI canceled per Dr. Darron who believed patient did not meet STEMI criteria.   Pertinent Labs/Diagnostics Findings: Na+/ K+: 137/3.7 Glucose: 175  Unremarkable CBC PCT: negative <0.10  Lactic acid: 3.4 UDS+ Cocaine COVID PCR: Negative,  troponin: 65  BNP: 1004.9 ABG: pO2 180; pCO2 64; pH 7.48;  HCO3 47.7, %O2 Sat 99.7.  CXR> CTH> CTA Chest> see full report below Medication administered in the ED: see code sheet Disposition:ICU  Discharge Diagnoses:  Principal Problem:   Acute on chronic respiratory failure with hypoxia and hypercapnia (HCC) Active Problems:   Acute respiratory failure with hypoxia and hypercapnia (HCC)   Non-STEMI (non-ST elevated myocardial infarction) (HCC)   History of acute anterior wall MI   COPD, very severe (HCC)   Cocaine abuse (HCC)   Resistant hypertension   Cardiac arrest (HCC)   Protein-calorie malnutrition, severe Acute hypoxic respiratory failure: secondary to COPD exacerbation and adenovirus. Weaned off of supplemental oxygen   COPD exacerbation: completed abx course. Continue on bronchodilators & encourage incentive spirometry   NSTEMI: d/c IV heparin  drip. Continue on coreg , entresto , statin & eliquis . Started farxiga  as per cardio. S/p Cardiac cath showed nonobstructive LCX disease.  Acute systolic CHF: echo w/ EF 35-40% w/ regional wall motion abnormalities.  Continue on eliquis , statin, coreg , entresto . Started farxiga  as per cardio   Alcohol  use: drinks 6 beers per night. CIWA protocol no longer needed   AKI: resolved   Illicit drug use: urine drug screen was positive for cocaine.  Received illicit drug use cessation counseling   Smoker: smoking cessation counseling x 5 mins. Refused a nicotine  patch    Discharge Instructions  Discharge Instructions     AMB referral to Phase II Cardiac Rehabilitation   Complete by: As directed    Diagnosis: NSTEMI   After initial evaluation and assessments completed: Virtual Based Care may be provided alone or in conjunction with Phase 2 Cardiac Rehab based on patient barriers.: Yes   Intensive Cardiac Rehabilitation (ICR) [facility] location only OR Traditional Cardiac Rehabilitation (TCR) *If criteria for ICR are not met will enroll in TCR [facility] only): Yes   Diet - low sodium heart healthy   Complete by: As directed    Discharge instructions   Complete by: As directed    F/u w/ cardio in 1-2 weeks. F/u w/ PCP in 1-2 weeks   Increase activity slowly   Complete by: As directed       Allergies as of 01/31/2023       Reactions   Latex Hives, Itching   Iodinated Contrast Media Hives, Rash   Other Itching, Rash   Turkey Meat allergy per patient        Medication List     STOP taking these medications    lisinopril  20 MG tablet Commonly known as: ZESTRIL        TAKE these medications    albuterol  108 (90 Base) MCG/ACT inhaler Commonly known as: VENTOLIN  HFA Inhale 1-2 puffs into the lungs every 6 (six) hours as needed for wheezing or shortness of breath.   apixaban  5 MG Tabs tablet Commonly known as: ELIQUIS  Take 5 mg by mouth 2 (two) times daily.   atorvastatin  40 MG tablet Commonly known as: LIPITOR  Take 20 mg by mouth at bedtime.   carvedilol  6.25 MG tablet Commonly known as: COREG  Take 6.25 mg by mouth 2 (two) times daily with a meal.   cholecalciferol  25 MCG (1000 UNIT) tablet Commonly known as: VITAMIN D3 Take 2,000 Units by mouth daily.   clopidogrel  75 MG tablet Commonly known as: PLAVIX  Take 75 mg by mouth daily.   dapagliflozin  propanediol 10 MG Tabs tablet Commonly known as: FARXIGA  Take 1  tablet (10 mg total) by mouth daily. Start taking on: February 01, 2023   fluticasone-salmeterol 250-50 MCG/ACT Aepb Commonly known as: ADVAIR Inhale 1 puff into the lungs in the morning and at bedtime.   melatonin 3 MG Tabs tablet Take 3 mg by mouth at bedtime.   methocarbamol  500 MG tablet Commonly known as: ROBAXIN  Take 500 mg by mouth every 8 (eight) hours as needed for muscle spasms.   pantoprazole  20 MG tablet Commonly known as: PROTONIX  Take 20 mg by mouth daily.   sacubitril -valsartan  49-51 MG Commonly known as: ENTRESTO  Take 1 tablet by mouth 2 (two) times daily.   Spiriva  Respimat 1.25 MCG/ACT Aers Generic drug: Tiotropium Bromide  Monohydrate Inhale 1 spray into the lungs daily.   tamsulosin  0.4 MG Caps capsule Commonly known as: FLOMAX  Take 0.4 mg by mouth at bedtime.        Allergies  Allergen Reactions   Latex Hives and Itching   Iodinated Contrast Media Hives and Rash  Other Itching and Rash    Turkey Meat allergy per patient    Consultations: Cardio Icu    Procedures/Studies: US  UPPER EXTREMITY ARTERIAL RIGHT LIMITED (GRAFT, SINGLE VESSEL) Result Date: 01/31/2023 CLINICAL DATA:  Post cardiac catheterization performed 01/30/2023 now with right upper extremity pain and edema. EXAM: RIGHT UPPER EXTREMITY ARTERIAL DUPLEX SCAN TECHNIQUE: Gray-scale sonography as well as color Doppler and duplex ultrasound was performed to evaluate the arteries of the upper extremity. COMPARISON:  Right upper extremity venous Doppler ultrasound-earlier same day (positive for occlusive DVT involving 1 of the paired divisions of the brachial vein, as well as both paired divisions of the radial vein; examination is also positive for occlusive SVT involving the right cephalic and basilic veins) FINDINGS: The right radial artery is patent at the level of the proximal forearm with biphasic waveform (image 6). The right renal artery appears occluded at the level of the distal  forearm (images 8 and 10). _________________________________________________________ The right ulnar artery is patent at the level of the proximal and distal forearm with biphasic waveforms at both locations (images 15 and 20). IMPRESSION: 1. Apparent short-segment occlusion of the radial artery at the level of the distal forearm with restored patency at the level of the proximal forearm. 2. Patency of the right ulnar artery at both the proximal and distal forearm. Electronically Signed   By: Norleen Roulette M.D.   On: 01/31/2023 15:13   US  Venous Img Upper Uni Right(DVT) Result Date: 01/31/2023 CLINICAL DATA:  Thrombus.  Post heart catheterization on 12/30. EXAM: RIGHT UPPER EXTREMITY VENOUS DOPPLER ULTRASOUND TECHNIQUE: Gray-scale sonography with graded compression, as well as color Doppler and duplex ultrasound were performed to evaluate the upper extremity deep venous system from the level of the subclavian vein and including the jugular, axillary, basilic, radial, ulnar and upper cephalic vein. Spectral Doppler was utilized to evaluate flow at rest and with distal augmentation maneuvers. COMPARISON:  None Available. FINDINGS: Contralateral Subclavian Vein: Respiratory phasicity is normal and symmetric with the symptomatic side. No evidence of thrombus. Normal compressibility. Internal Jugular Vein: No evidence of thrombus. Normal compressibility, respiratory phasicity and response to augmentation. Subclavian Vein: No evidence of thrombus. Normal compressibility, respiratory phasicity and response to augmentation. Axillary Vein: No evidence of thrombus. Normal compressibility, respiratory phasicity and response to augmentation. Cephalic Vein: There is hypoechoic occlusive thrombus involving the mid and distal aspects of the right cephalic vein (images 34 through 37). Basilic Vein: There is occlusive thrombus involving the right basilic vein (images 22 and 23). Brachial Veins: There is hypoechoic occlusive  thrombus involving one of the paired divisions of the right brachial vein (image 13 and 15). The adjacent paired brachial vein appears widely patent. Radial Veins: There is occlusive thrombus involving both paired divisions of the right radial vein (image 28). Ulnar Veins: No evidence of thrombus. Normal compressibility, respiratory phasicity and response to augmentation. Other Findings:  None visualized. IMPRESSION: 1. Examination is positive for occlusive DVT involving one of the paired divisions of the right brachial vein as well as both paired divisions of the right radial vein. 2. Examination is positive for occlusive SVT involving the right cephalic and basilic veins. Electronically Signed   By: Norleen Roulette M.D.   On: 01/31/2023 13:52   CARDIAC CATHETERIZATION Result Date: 01/30/2023 Conclusions: Mild, nonobstructive coronary artery disease, as detailed below.  Cardiomyopathy appears nonischemic.  Elevated troponin is most likely due to supply demand mismatch in the setting acute respiratory failure with hypoxia and PEA arrest. Mildly  elevated left and right heart filling pressures. Mildly elevated pulmonary artery pressure. Normal Fick cardiac output/index. Apparent short segment occlusion of the right radial artery.  Question thrombosis of the basilic vein. Recommendations: Continue primary prevention of coronary artery disease. Escalate goal-directed medical therapy for nonischemic cardiomyopathy. Consider right radial artery and right upper arm venous duplex studies to evaluate for thrombus.  If thrombus formation is confirmed, recommend resumption of anticoagulation.  Given that the patient has been on heparin , it would be prudent to exclude heparin -induced thrombocytopenia if clot is confirmed. Lonni Hanson, MD [facility]  ECHOCARDIOGRAM COMPLETE Result Date: 01/26/2023    ECHOCARDIOGRAM REPORT   Patient Name:   Christopher Bryan Date of Exam: 01/26/2023 Medical Rec #:  [id number]    Height:        70.0 in Accession #:    [id number]   Weight:       125.2 lb Date of Birth:  23-Nov-1960    BSA:          1.711 m Patient Age:    62 years     BP:           186/116 mmHg Patient Gender: M            HR:           117 bpm. Exam Location:  [facility] Procedure: 2D Echo, Cardiac Doppler, Color Doppler and Strain Analysis Indications:     Acute ischemic heart disease I24.9  History:         Patient has no prior history of Echocardiogram examinations. No                  past medical history on file.  Sonographer:     Christopher Furnace Referring Phys:  [id number] EVALENE JINNY LUNGER Diagnosing Phys: Evalene Lunger MD  Sonographer Comments: Global longitudinal strain was attempted. IMPRESSIONS  1. Left ventricular ejection fraction, by estimation, is 35 to 40%. The left ventricle has moderately decreased function. The left ventricle demonstrates mild global hypokinesis with regional wall motion abnormalities (severe hypokinesis of the anterior, anteroseptal and apical region). The average left ventricular global longitudinal strain is -9.3 %.  2. Right ventricular systolic function is normal. The right ventricular size is normal.  3. The mitral valve is normal in structure. No evidence of mitral valve regurgitation. No evidence of mitral stenosis.  4. The aortic valve is normal in structure. Aortic valve regurgitation is mild to moderate. Aortic valve sclerosis/calcification is present, without any evidence of aortic stenosis.  5. The inferior vena cava is normal in size with greater than 50% respiratory variability, suggesting right atrial pressure of 3 mmHg. FINDINGS  Left Ventricle: Left ventricular ejection fraction, by estimation, is 35 to 40%. The left ventricle has moderately decreased function. The left ventricle demonstrates regional wall motion abnormalities. The average left ventricular global longitudinal strain is -9.3 %. The left ventricular internal cavity size was normal in size. There is no left ventricular hypertrophy. Left  ventricular diastolic parameters are indeterminate. Right Ventricle: The right ventricular size is normal. No increase in right ventricular wall thickness. Right ventricular systolic function is normal. Left Atrium: Left atrial size was normal in size. Right Atrium: Right atrial size was normal in size. Pericardium: There is no evidence of pericardial effusion. Mitral Valve: The mitral valve is normal in structure. No evidence of mitral valve regurgitation. No evidence of mitral valve stenosis. Tricuspid Valve: The tricuspid valve is normal in structure. Tricuspid valve regurgitation is not demonstrated.  No evidence of tricuspid stenosis. Aortic Valve: The aortic valve is normal in structure. Aortic valve regurgitation is mild to moderate. Aortic valve sclerosis/calcification is present, without any evidence of aortic stenosis. Aortic valve mean gradient measures 3.0 mmHg. Aortic valve peak gradient measures 5.2 mmHg. Aortic valve area, by VTI measures 2.97 cm. Pulmonic Valve: The pulmonic valve was normal in structure. Pulmonic valve regurgitation is not visualized. No evidence of pulmonic stenosis. Aorta: The aortic root is normal in size and structure. Venous: The inferior vena cava is normal in size with greater than 50% respiratory variability, suggesting right atrial pressure of 3 mmHg. IAS/Shunts: No atrial level shunt detected by color flow Doppler.  LEFT VENTRICLE PLAX 2D LVIDd:         5.00 cm      Diastology LVIDs:         3.50 cm      LV e' medial:    5.87 cm/s LV PW:         1.00 cm      LV E/e' medial:  12.9 LV IVS:        1.10 cm      LV e' lateral:   5.98 cm/s LVOT diam:     2.10 cm      LV E/e' lateral: 12.6 LV SV:         48 LV SV Index:   28           2D Longitudinal Strain LVOT Area:     3.46 cm     2D Strain GLS Avg:     -9.3 %  LV Volumes (MOD) LV vol d, MOD A2C: 134.0 ml LV vol d, MOD A4C: 115.0 ml LV vol s, MOD A2C: 63.3 ml LV vol s, MOD A4C: 63.8 ml LV SV MOD A2C:     70.7 ml LV SV MOD  A4C:     115.0 ml LV SV MOD BP:      63.2 ml RIGHT VENTRICLE RV Basal diam:  3.50 cm RV Mid diam:    3.20 cm RV S prime:     11.40 cm/s TAPSE (M-mode): 1.9 cm LEFT ATRIUM           Index        RIGHT ATRIUM           Index LA diam:      3.10 cm 1.81 cm/m   RA Area:     12.70 cm LA Vol (A2C): 38.6 ml 22.56 ml/m  RA Volume:   25.30 ml  14.79 ml/m LA Vol (A4C): 20.7 ml 12.10 ml/m  AORTIC VALVE AV Area (Vmax):    2.45 cm AV Area (Vmean):   2.73 cm AV Area (VTI):     2.97 cm AV Vmax:           114.00 cm/s AV Vmean:          69.500 cm/s AV VTI:            0.161 m AV Peak Grad:      5.2 mmHg AV Mean Grad:      3.0 mmHg LVOT Vmax:         80.60 cm/s LVOT Vmean:        54.700 cm/s LVOT VTI:          0.138 m LVOT/AV VTI ratio: 0.86  AORTA Ao Root diam: 3.00 cm MITRAL VALVE               TRICUSPID  VALVE MV Area (PHT): 5.02 cm    TR Peak grad:   31.8 mmHg MV Decel Time: 151 msec    TR Vmax:        282.00 cm/s MV E velocity: 75.50 cm/s MV A velocity: 41.90 cm/s  SHUNTS MV E/A ratio:  1.80        Systemic VTI:  0.14 m                            Systemic Diam: 2.10 cm Evalene Lunger MD Electronically signed by Evalene Lunger MD Signature Date/Time: 01/26/2023/2:11:52 PM    Final    DG Abd 1 View Result Date: 01/26/2023 CLINICAL DATA:  [id number] Encounter for orogastric tube placement [id number] EXAM: ABDOMEN - 1 VIEW COMPARISON:  Same-day x-ray FINDINGS: Limited radiograph of the lower chest and upper abdomen was obtained for the purposes of enteric tube localization. Enteric tube is seen coursing below the diaphragm with distal tipterminating within the expected location of the gastric body. Side port positioned just below the level of the GE junction. IMPRESSION: Enteric tube with distal tip terminating within the expected location of the gastric body. Side port positioned just below the level of the GE junction. Recommend 5 cm of advancement. Electronically Signed   By: Mabel Converse D.O.   On: 01/26/2023 12:21   DG  Chest Port 1 View Result Date: 01/26/2023 CLINICAL DATA:  Central line placement. EXAM: PORTABLE CHEST 1 VIEW COMPARISON:  Same day. FINDINGS: Interval placement of left internal jugular catheter with distal tip in expected position of the SVC. No pneumothorax. IMPRESSION: Interval placement of left internal jugular catheter with distal tip in expected position of the SVC. Electronically Signed   By: Lynwood Landy Raddle M.D.   On: 01/26/2023 08:53   DG Chest Port 1 View Result Date: 01/26/2023 CLINICAL DATA:  Respiratory failure. EXAM: PORTABLE CHEST 1 VIEW COMPARISON:  Same day. FINDINGS: The heart size and mediastinal contours are within normal limits. Endotracheal tube is in grossly good position. No acute pulmonary disease. The visualized skeletal structures are unremarkable. IMPRESSION: No active disease. Electronically Signed   By: Lynwood Landy Raddle M.D.   On: 01/26/2023 07:52   CT HEAD WO CONTRAST ( ) Result Date: 01/26/2023 CLINICAL DATA:  Hypertensive, altered mental status EXAM: CT HEAD WITHOUT CONTRAST TECHNIQUE: Contiguous axial images were obtained from the base of the skull through the vertex without intravenous contrast. RADIATION DOSE REDUCTION: This exam was performed according to the departmental dose-optimization program which includes automated exposure control, adjustment of the mA and/or kV according to patient size and/or use of iterative reconstruction technique. COMPARISON:  None Available. FINDINGS: Brain: No acute intracranial abnormality. Specifically, no hemorrhage, hydrocephalus, mass lesion, acute infarction, or significant intracranial injury. Vascular: No hyperdense vessel or unexpected calcification. Skull: No acute calvarial abnormality. Sinuses/Orbits: No acute findings Other: None IMPRESSION: No acute intracranial abnormality. Electronically Signed   By: Franky Crease M.D.   On: 01/26/2023 01:47   CT Angio Chest Pulmonary Embolism (PE) W or WO Contrast Result Date:  01/26/2023 CLINICAL DATA:  Pulmonary embolism (PE) suspected, high prob. Code STEMI. Respiratory distress, hypoxia. EXAM: CT ANGIOGRAPHY CHEST WITH CONTRAST TECHNIQUE: Multidetector CT imaging of the chest was performed using the standard protocol during bolus administration of intravenous contrast. Multiplanar CT image reconstructions and MIPs were obtained to evaluate the vascular anatomy. RADIATION DOSE REDUCTION: This exam was performed according to the departmental dose-optimization program which includes automated  exposure control, adjustment of the mA and/or kV according to patient size and/or use of iterative reconstruction technique. CONTRAST:  75mL OMNIPAQUE  IOHEXOL  350 MG/ML SOLN COMPARISON:  None Available. FINDINGS: Cardiovascular: No filling defects in the pulmonary arteries to suggest pulmonary emboli. Heart is normal size. Aorta is normal caliber. Scattered aortic calcifications. Mediastinum/Nodes: Endotracheal tube tip in the midtrachea. Layering mucus within the trachea. Thyroid and esophagus unremarkable. Lungs/Pleura: Moderate to advanced emphysema. Airspace opacity in the left lower lobe may reflect early infiltrate/pneumonia. No confluent opacity on the right. No effusions. Upper Abdomen: No acute findings Musculoskeletal: Chest wall soft tissues are unremarkable. No acute bony abnormality. Review of the MIP images confirms the above findings. IMPRESSION: Airspace opacity in the left lower lobe concerning for early infiltrate/pneumonia. Moderate to severe emphysema. Endotracheal tube tip in the midtrachea. No evidence of pulmonary embolus. Aortic Atherosclerosis (ICD10-I70.0) and Emphysema (ICD10-J43.9). Electronically Signed   By: Franky Crease M.D.   On: 01/26/2023 01:46   DG Abdomen 1 View Result Date: 01/26/2023 CLINICAL DATA:  OG tube placement EXAM: ABDOMEN - 1 VIEW COMPARISON:  Chest 01/26/2023 FINDINGS: Limited field of view for tube placement verification. No enteric tube is  demonstrated within the field of view. Visualized bowel gas pattern is normal. Lung bases are clear. IMPRESSION: No enteric tube is demonstrated within the field of view. Electronically Signed   By: Elsie Gravely M.D.   On: 01/26/2023 01:33   DG Chest Portable 1 View Result Date: 01/26/2023 CLINICAL DATA:  Possible tension pneumothorax.  OG tube placement EXAM: PORTABLE CHEST 1 VIEW COMPARISON:  01/26/2023 FINDINGS: Endotracheal tube present with tip measuring 4.5 cm above the carina. Heart size and pulmonary vascularity are normal. Emphysematous changes in the lungs. Scattered fibrosis. No airspace disease or consolidation. No pleural effusion. No pneumothorax. Calcification of the aorta. IMPRESSION: Emphysematous changes in the lungs. No pneumothorax. No evidence of pulmonary collapse or consolidation. Electronically Signed   By: Elsie Gravely M.D.   On: 01/26/2023 01:32   DG Chest Portable 1 View Result Date: 01/26/2023 CLINICAL DATA:  Intubated, respiratory failure EXAM: PORTABLE CHEST 1 VIEW COMPARISON:  None available FINDINGS: Endotracheal tube is 7 cm above the carina. Heart is normal size. Aortic atherosclerosis. No confluent airspace opacities or effusions. No acute bony abnormality. IMPRESSION: Endotracheal tube 7 cm above the carina. No acute cardiopulmonary disease. Electronically Signed   By: Franky Crease M.D.   On: 01/26/2023 00:38   (Echo, Carotid, EGD, Colonoscopy, ERCP)    Subjective: pt c/o fatigue    Discharge Exam: Vitals:   01/31/23 0732 01/31/23 1128  BP: (!) 144/74 138/68  Pulse: 64 61  Resp: 16 16  Temp: 98 F (36.7 C) 97.6 F (36.4 C)  SpO2: 97% 98%   Vitals:   01/31/23 0407 01/31/23 0516 01/31/23 0732 01/31/23 1128  BP:  (!) 126/44 (!) 144/74 138/68  Pulse:  (!) 57 64 61  Resp:  18 16 16   Temp:  97.8 F (36.6 C) 98 F (36.7 C) 97.6 F (36.4 C)  TempSrc:   Oral Oral  SpO2:  96% 97% 98%  Weight: 61.6 kg     Height:        General: Pt is alert,  awake, not in acute distress Cardiovascular: S1/S2 +, no rubs, no gallops Respiratory: CTA bilaterally, no wheezing, no rhonchi Abdominal: Soft, NT, ND, bowel sounds + Extremities:  no cyanosis    The results of significant diagnostics from this hospitalization (including imaging, microbiology, ancillary and laboratory)  are listed below for reference.     Microbiology: Recent Results (from the past 240 hours)  Blood culture (routine x 2)     Status: None   Collection Time: 01/26/23 12:10 AM   Specimen: BLOOD  Result Value Ref Range Status   Specimen Description BLOOD RIGHT WRIST  Final   Special Requests   Final    BOTTLES DRAWN AEROBIC AND ANAEROBIC Blood Culture results may not be optimal due to an inadequate volume of blood received in culture bottles   Culture   Final    NO GROWTH 5 DAYS Performed at [facility], [address] [place], [place] [address]    Report Status 01/31/2023 FINAL  Final  MRSA Next Gen by PCR, Nasal     Status: None   Collection Time: 01/26/23  1:53 AM   Specimen: Nasal Mucosa; Nasal Swab  Result Value Ref Range Status   MRSA by PCR Next Gen NOT DETECTED NOT DETECTED Final    Comment: (NOTE) The GeneXpert MRSA Assay (FDA approved for NASAL specimens only), is one component of a comprehensive MRSA colonization surveillance program. It is not intended to diagnose MRSA infection nor to guide or monitor treatment for MRSA infections. Test performance is not FDA approved in patients less than 64 years old. Performed at [facility], [address] [place], [place] [address]   Blood culture (routine x 2)     Status: None   Collection Time: 01/26/23  2:55 AM   Specimen: BLOOD  Result Value Ref Range Status   Specimen Description BLOOD LEFT ARM  Final   Special Requests   Final    BOTTLES DRAWN AEROBIC AND ANAEROBIC Blood Culture results may not be optimal due to an inadequate volume of blood received in culture bottles    Culture   Final    NO GROWTH 5 DAYS Performed at [facility], [address] [place], [place] [address]    Report Status 01/31/2023 FINAL  Final  Resp panel by RT-PCR (RSV, Flu A&B, Covid) Anterior Nasal Swab     Status: None   Collection Time: 01/26/23  4:10 AM   Specimen: Anterior Nasal Swab  Result Value Ref Range Status   SARS Coronavirus 2 by RT PCR NEGATIVE NEGATIVE Final    Comment: (NOTE) SARS-CoV-2 target nucleic acids are NOT DETECTED.  The SARS-CoV-2 RNA is generally detectable in upper respiratory specimens during the acute phase of infection. The lowest concentration of SARS-CoV-2 viral copies this assay can detect is 138 copies/mL. A negative result does not preclude SARS-Cov-2 infection and should not be used as the sole basis for treatment or other patient management decisions. A negative result may occur with  improper specimen collection/handling, submission of specimen other than nasopharyngeal swab, presence of viral mutation(s) within the areas targeted by this assay, and inadequate number of viral copies(<138 copies/mL). A negative result must be combined with clinical observations, patient history, and epidemiological information. The expected result is Negative.  Fact Sheet for Patients:  bloggercourse.com  Fact Sheet for Healthcare Providers:  seriousbroker.it  This test is no t yet approved or cleared by the [place]  FDA and  has been authorized for detection and/or diagnosis of SARS-CoV-2 by FDA under an Emergency Use Authorization (EUA). This EUA will remain  in effect (meaning this test can be used) for the duration of the COVID-19 declaration under Section 564(b)(1) of the Act, 21 U.S.C.section 360bbb-3(b)(1), unless the authorization is terminated  or revoked sooner.  Influenza A by PCR NEGATIVE NEGATIVE Final   Influenza B by PCR NEGATIVE NEGATIVE Final    Comment:  (NOTE) The Xpert Xpress SARS-CoV-2/FLU/RSV plus assay is intended as an aid in the diagnosis of influenza from Nasopharyngeal swab specimens and should not be used as a sole basis for treatment. Nasal washings and aspirates are unacceptable for Xpert Xpress SARS-CoV-2/FLU/RSV testing.  Fact Sheet for Patients: bloggercourse.com  Fact Sheet for Healthcare Providers: seriousbroker.it  This test is not yet approved or cleared by the [place]  FDA and has been authorized for detection and/or diagnosis of SARS-CoV-2 by FDA under an Emergency Use Authorization (EUA). This EUA will remain in effect (meaning this test can be used) for the duration of the COVID-19 declaration under Section 564(b)(1) of the Act, 21 U.S.C. section 360bbb-3(b)(1), unless the authorization is terminated or revoked.     Resp Syncytial Virus by PCR NEGATIVE NEGATIVE Final    Comment: (NOTE) Fact Sheet for Patients: bloggercourse.com  Fact Sheet for Healthcare Providers: seriousbroker.it  This test is not yet approved or cleared by the [place]  FDA and has been authorized for detection and/or diagnosis of SARS-CoV-2 by FDA under an Emergency Use Authorization (EUA). This EUA will remain in effect (meaning this test can be used) for the duration of the COVID-19 declaration under Section 564(b)(1) of the Act, 21 U.S.C. section 360bbb-3(b)(1), unless the authorization is terminated or revoked.  Performed at [facility], [address] [place], [place] [address]   Respiratory (~20 pathogens) panel by PCR     Status: Abnormal   Collection Time: 01/26/23  4:10 AM   Specimen: Nasopharyngeal Swab; Respiratory  Result Value Ref Range Status   Adenovirus DETECTED (A) NOT DETECTED Final   Coronavirus 229E NOT DETECTED NOT DETECTED Final    Comment: (NOTE) The Coronavirus on the Respiratory  Panel, DOES NOT test for the novel  Coronavirus (2019 nCoV)    Coronavirus HKU1 NOT DETECTED NOT DETECTED Final   Coronavirus NL63 NOT DETECTED NOT DETECTED Final   Coronavirus OC43 NOT DETECTED NOT DETECTED Final   Metapneumovirus NOT DETECTED NOT DETECTED Final   Rhinovirus / Enterovirus NOT DETECTED NOT DETECTED Final   Influenza A NOT DETECTED NOT DETECTED Final   Influenza B NOT DETECTED NOT DETECTED Final   Parainfluenza Virus 1 NOT DETECTED NOT DETECTED Final   Parainfluenza Virus 2 NOT DETECTED NOT DETECTED Final   Parainfluenza Virus 3 NOT DETECTED NOT DETECTED Final   Parainfluenza Virus 4 NOT DETECTED NOT DETECTED Final   Respiratory Syncytial Virus NOT DETECTED NOT DETECTED Final   Bordetella pertussis NOT DETECTED NOT DETECTED Final   Bordetella Parapertussis NOT DETECTED NOT DETECTED Final   Chlamydophila pneumoniae NOT DETECTED NOT DETECTED Final   Mycoplasma pneumoniae NOT DETECTED NOT DETECTED Final    Comment: Performed at [facility], [address] [place], [place] [address]  Culture, Respiratory w Gram Stain     Status: None   Collection Time: 01/26/23 11:29 AM   Specimen: Tracheal Aspirate  Result Value Ref Range Status   Specimen Description TRACHEAL ASPIRATE  Final   Special Requests NONE  Final   Gram Stain   Final    ABUNDANT WBC PRESENT, PREDOMINANTLY PMN RARE GRAM POSITIVE COCCI    Culture   Final    MODERATE MORAXELLA CATARRHALIS(BRANHAMELLA) BETA LACTAMASE POSITIVE    Report Status 01/29/2023 FINAL  Final     Labs: BNP (last 3 results) Recent Labs    01/26/23 0010  BNP 1,004.9*   Basic Metabolic Panel: Recent Labs  Lab 01/26/23 0255 01/27/23 0355 01/28/23 0507 01/29/23 0513 01/30/23 0350 01/30/23 1627 01/30/23 1640 01/31/23 0420  NA 141 141 140 140 138 142 142 140  K 3.5 5.0 4.3 4.0 3.7 3.9 3.9 3.8  CL 102 103 104 107 106  --   --  110  CO2 26 26 29 26 24   --   --  25  GLUCOSE 116* 122* 143* 113* 150*  --   --  116*   BUN 9 23 31* 29* 22  --   --  17  CREATININE 1.15 2.07* 1.35* 1.27* 0.96  --   --  0.96  CALCIUM  11.7* 9.8 8.8* 8.5* 8.3*  --   --  8.4*  MG 2.6* 2.3 2.2 2.0 1.8  --   --   --   PHOS 7.3* 4.0 2.5 2.0* 1.4*  --   --   --    Liver Function Tests: Recent Labs  Lab 01/26/23 0010  AST 23  ALT 12  ALKPHOS 103  BILITOT 0.4  PROT 8.6*  ALBUMIN 4.1   No results for input(s): LIPASE, AMYLASE in the last 168 hours. No results for input(s): AMMONIA in the last 168 hours. CBC: Recent Labs  Lab 01/26/23 0010 01/26/23 0255 01/27/23 0355 01/28/23 0507 01/29/23 0513 01/30/23 0350 01/30/23 1627 01/30/23 1640 01/31/23 0420  WBC 9.3   < > 8.5 8.4 6.5 5.3  --   --  7.3  NEUTROABS 4.9  --   --   --   --   --   --   --   --   HGB 14.4   < > 11.7* 10.3* 9.3* 10.2* 9.9* 9.5* 10.3*  HCT 46.4   < > 36.2* 32.2* 28.4* 30.9* 29.0* 28.0* 31.9*  MCV 91.9   < > 90.0 89.4 89.0 86.3  --   --  88.4  PLT 366   < > 181 157 162 168  --   --  178   < > = values in this interval not displayed.   Cardiac Enzymes: Recent Labs  Lab 01/26/23 0900  CKTOTAL 151   BNP: Invalid input(s): POCBNP CBG: Recent Labs  Lab 01/30/23 0802 01/30/23 1207 01/30/23 2046 01/31/23 0734 01/31/23 1131  GLUCAP 138* 131* 199* 115* 96   D-Dimer No results for input(s): DDIMER in the last 72 hours. Hgb A1c No results for input(s): HGBA1C in the last 72 hours. Lipid Profile Recent Labs    01/31/23 0420  CHOL 153  HDL 48  LDLCALC 85  TRIG 100  CHOLHDL 3.2   Thyroid function studies No results for input(s): TSH, T4TOTAL, T3FREE, THYROIDAB in the last 72 hours.  Invalid input(s): FREET3 Anemia work up No results for input(s): VITAMINB12, FOLATE, FERRITIN, TIBC, IRON, RETICCTPCT in the last 72 hours. Urinalysis    Component Value Date/Time   COLORURINE YELLOW (A) 01/26/2023 0010   APPEARANCEUR HAZY (A) 01/26/2023 0010   LABSPEC 1.014 01/26/2023 0010   PHURINE 5.0  01/26/2023 0010   GLUCOSEU NEGATIVE 01/26/2023 0010   HGBUR NEGATIVE 01/26/2023 0010   BILIRUBINUR NEGATIVE 01/26/2023 0010   KETONESUR NEGATIVE 01/26/2023 0010   PROTEINUR 100 (A) 01/26/2023 0010   NITRITE NEGATIVE 01/26/2023 0010   LEUKOCYTESUR NEGATIVE 01/26/2023 0010   Sepsis Labs Recent Labs  Lab 01/28/23 0507 01/29/23 0513 01/30/23 0350 01/31/23 0420  WBC 8.4 6.5 5.3 7.3   Microbiology Recent Results (from the past 240 hours)  Blood  culture (routine x 2)     Status: None   Collection Time: 01/26/23 12:10 AM   Specimen: BLOOD  Result Value Ref Range Status   Specimen Description BLOOD RIGHT WRIST  Final   Special Requests   Final    BOTTLES DRAWN AEROBIC AND ANAEROBIC Blood Culture results may not be optimal due to an inadequate volume of blood received in culture bottles   Culture   Final    NO GROWTH 5 DAYS Performed at [facility], [address], [place], [place] [address]    Report Status 01/31/2023 FINAL  Final  MRSA Next Gen by PCR, Nasal     Status: None   Collection Time: 01/26/23  1:53 AM   Specimen: Nasal Mucosa; Nasal Swab  Result Value Ref Range Status   MRSA by PCR Next Gen NOT DETECTED NOT DETECTED Final    Comment: (NOTE) The GeneXpert MRSA Assay (FDA approved for NASAL specimens only), is one component of a comprehensive MRSA colonization surveillance program. It is not intended to diagnose MRSA infection nor to guide or monitor treatment for MRSA infections. Test performance is not FDA approved in patients less than 69 years old. Performed at [id number], [address] [place], [place] [address]   Blood culture (routine x 2)     Status: None   Collection Time: 01/26/23  2:55 AM   Specimen: BLOOD  Result Value Ref Range Status   Specimen Description BLOOD LEFT ARM  Final   Special Requests   Final    BOTTLES DRAWN AEROBIC AND ANAEROBIC Blood Culture results may not be optimal due to an inadequate volume of blood  received in culture bottles   Culture   Final    NO GROWTH 5 DAYS Performed at [facility], [address] [place], [place] [address]    Report Status 01/31/2023 FINAL  Final  Resp panel by RT-PCR (RSV, Flu A&B, Covid) Anterior Nasal Swab     Status: None   Collection Time: 01/26/23  4:10 AM   Specimen: Anterior Nasal Swab  Result Value Ref Range Status   SARS Coronavirus 2 by RT PCR NEGATIVE NEGATIVE Final    Comment: (NOTE) SARS-CoV-2 target nucleic acids are NOT DETECTED.  The SARS-CoV-2 RNA is generally detectable in upper respiratory specimens during the acute phase of infection. The lowest concentration of SARS-CoV-2 viral copies this assay can detect is 138 copies/mL. A negative result does not preclude SARS-Cov-2 infection and should not be used as the sole basis for treatment or other patient management decisions. A negative result may occur with  improper specimen collection/handling, submission of specimen other than nasopharyngeal swab, presence of viral mutation(s) within the areas targeted by this assay, and inadequate number of viral copies(<138 copies/mL). A negative result must be combined with clinical observations, patient history, and epidemiological information. The expected result is Negative.  Fact Sheet for Patients:  bloggercourse.com  Fact Sheet for Healthcare Providers:  seriousbroker.it  This test is no t yet approved or cleared by the [place]  FDA and  has been authorized for detection and/or diagnosis of SARS-CoV-2 by FDA under an Emergency Use Authorization (EUA). This EUA will remain  in effect (meaning this test can be used) for the duration of the COVID-19 declaration under Section 564(b)(1) of the Act, 21 U.S.C.section 360bbb-3(b)(1), unless the authorization is terminated  or revoked sooner.       Influenza A by PCR NEGATIVE NEGATIVE Final   Influenza B by PCR  NEGATIVE  NEGATIVE Final    Comment: (NOTE) The Xpert Xpress SARS-CoV-2/FLU/RSV plus assay is intended as an aid in the diagnosis of influenza from Nasopharyngeal swab specimens and should not be used as a sole basis for treatment. Nasal washings and aspirates are unacceptable for Xpert Xpress SARS-CoV-2/FLU/RSV testing.  Fact Sheet for Patients: bloggercourse.com  Fact Sheet for Healthcare Providers: seriousbroker.it  This test is not yet approved or cleared by the [place]  FDA and has been authorized for detection and/or diagnosis of SARS-CoV-2 by FDA under an Emergency Use Authorization (EUA). This EUA will remain in effect (meaning this test can be used) for the duration of the COVID-19 declaration under Section 564(b)(1) of the Act, 21 U.S.C. section 360bbb-3(b)(1), unless the authorization is terminated or revoked.     Resp Syncytial Virus by PCR NEGATIVE NEGATIVE Final    Comment: (NOTE) Fact Sheet for Patients: bloggercourse.com  Fact Sheet for Healthcare Providers: seriousbroker.it  This test is not yet approved or cleared by the [place]  FDA and has been authorized for detection and/or diagnosis of SARS-CoV-2 by FDA under an Emergency Use Authorization (EUA). This EUA will remain in effect (meaning this test can be used) for the duration of the COVID-19 declaration under Section 564(b)(1) of the Act, 21 U.S.C. section 360bbb-3(b)(1), unless the authorization is terminated or revoked.  Performed at [facility], [address] [place], [place] [address]   Respiratory (~20 pathogens) panel by PCR     Status: Abnormal   Collection Time: 01/26/23  4:10 AM   Specimen: Nasopharyngeal Swab; Respiratory  Result Value Ref Range Status   Adenovirus DETECTED (A) NOT DETECTED Final   Coronavirus 229E NOT DETECTED NOT DETECTED Final    Comment: (NOTE) The  Coronavirus on the Respiratory Panel, DOES NOT test for the novel  Coronavirus (2019 nCoV)    Coronavirus HKU1 NOT DETECTED NOT DETECTED Final   Coronavirus NL63 NOT DETECTED NOT DETECTED Final   Coronavirus OC43 NOT DETECTED NOT DETECTED Final   Metapneumovirus NOT DETECTED NOT DETECTED Final   Rhinovirus / Enterovirus NOT DETECTED NOT DETECTED Final   Influenza A NOT DETECTED NOT DETECTED Final   Influenza B NOT DETECTED NOT DETECTED Final   Parainfluenza Virus 1 NOT DETECTED NOT DETECTED Final   Parainfluenza Virus 2 NOT DETECTED NOT DETECTED Final   Parainfluenza Virus 3 NOT DETECTED NOT DETECTED Final   Parainfluenza Virus 4 NOT DETECTED NOT DETECTED Final   Respiratory Syncytial Virus NOT DETECTED NOT DETECTED Final   Bordetella pertussis NOT DETECTED NOT DETECTED Final   Bordetella Parapertussis NOT DETECTED NOT DETECTED Final   Chlamydophila pneumoniae NOT DETECTED NOT DETECTED Final   Mycoplasma pneumoniae NOT DETECTED NOT DETECTED Final    Comment: Performed at [facility], [address] [place], [place] [address]  Culture, Respiratory w Gram Stain     Status: None   Collection Time: 01/26/23 11:29 AM   Specimen: Tracheal Aspirate  Result Value Ref Range Status   Specimen Description TRACHEAL ASPIRATE  Final   Special Requests NONE  Final   Gram Stain   Final    ABUNDANT WBC PRESENT, PREDOMINANTLY PMN RARE GRAM POSITIVE COCCI    Culture   Final    MODERATE MORAXELLA CATARRHALIS(BRANHAMELLA) BETA LACTAMASE POSITIVE    Report Status 01/29/2023 FINAL  Final     Time coordinating discharge: Over 30 minutes  SIGNED:   Anthony CHRISTELLA Pouch, MD  [facility] 01/31/2023, 3:42 PM Pager   If 7PM-7AM,  please contact night-coverage [email or web address]

## 2023-01-31 NOTE — Care Management Important Message (Signed)
 Important Message  Patient Details  Name: Christopher Bryan MRN: [id number] Date of Birth: 11-19-1960   Important Message Given:  Other (see comment)     Sherilyn Banker 01/31/2023, 1:20 PM

## 2023-01-31 NOTE — Progress Notes (Signed)
 Heart Failure Nurse Navigator Progress Note  PCP: No primary care provider on file. PCP-Cardiologist: [facility] Admission Diagnosis: Acute respiratory failure with hypoxia and hypercapnia [facility]) Admitted from: Home-EMS  Presentation:   Lynwood Furry presented by EMS for evaluation of respiratory failure.  Reportedly called out for shortness of breath for couple hours.  They found him tripoding, Sats in the 40's on room air. BNP 1,004.9.   ECHO/ LVEF: 35-40%  Clinical Course:  History reviewed. No pertinent past medical history.   Social History   Socioeconomic History   Marital status: Not on file    Spouse name: Not on file   Number of children: Not on file   Years of education: Not on file   Highest education level: Not on file  Occupational History   Not on file  Tobacco Use   Smoking status: Never   Smokeless tobacco: Never  Vaping Use   Vaping status: Not on file  Substance and Sexual Activity   Alcohol  use: Not Currently    Comment: 0   Drug use: Yes    Frequency: 1.0 times per week    Types: Crack cocaine   Sexual activity: Not Currently  Other Topics Concern   Not on file  Social History Narrative   Not on file   Social Drivers of Health   Financial Resource Strain: Not on file  Food Insecurity: Patient Unable To Answer (01/30/2023)   Hunger Vital Sign    Worried About Running Out of Food in the Last Year: Patient unable to answer    Ran Out of Food in the Last Year: Patient unable to answer  Transportation Needs: Patient Unable To Answer (01/30/2023)   PRAPARE - Administrator, Civil Service (Medical): Patient unable to answer    Lack of Transportation (Non-Medical): Patient unable to answer  Physical Activity: Not on file  Stress: Not on file  Social Connections: Not on file   Education Assessment and Provision:  Detailed education and instructions provided on heart failure disease management including the  following:  Signs and symptoms of Heart Failure When to call the physician Importance of daily weights Low sodium diet Fluid restriction Medication management Anticipated future follow-up appointments  Patient education given on each of the above topics.  Patient acknowledges understanding via teach back method and acceptance of all instructions.  Education Materials:  Living Better With Heart Failure Booklet, HF zone tool, & Daily Weight Tracker Tool.  Patient has scale at home: No-provided him with one for discharge. Patient has pill box at home: Yes    High Risk Criteria for Readmission and/or Poor Patient Outcomes: Heart failure hospital admissions (last 6 months): 0  No Show rate: 0 Difficult social situation: Currently lives in a boarding house.  Would like to move to a different housing location. Provided patient with the Housing Handout for [employer] and surrounding areas. Demonstrates medication adherence: Yes Primary Language: English Literacy level: Reading, Writing & Comprehension  Barriers of Care:   Diet and Fluid Restrictions Daily Weights-provided patient with scale for discharge. Medication Compliance Housing concerns-Currently @ [facility], [place]  Considerations/Referrals:   Referral made to Heart Failure Pharmacist Stewardship: Yes Referral made to Heart Failure CSW/NCM TOC: No -provided patient with housing information and back up transportation information for follow-up appointments. Referral made to Heart & Vascular TOC clinic: No- Patient already has a current follow-up @ the [facility] hospital on 02/06/[facility].  Pt sets up his own transportation to  and from for appointments.  Items for Follow-up on DC/TOC: Diet & Fluid Restrictions Daily Weights Medication Compliance Nicotine , ETOH, & Drug use Cessation Continued Heart Failure Education   Charmaine Pines, RN, BSN [facility] Heart Failure Navigator Secure Chat Only

## 2023-01-31 NOTE — Plan of Care (Signed)
  Problem: Education: Goal: Knowledge of General Education information will improve Description: Including pain rating scale, medication(s)/side effects and non-pharmacologic comfort measures Outcome: Progressing   Problem: Health Behavior/Discharge Planning: Goal: Ability to manage health-related needs will improve Outcome: Progressing   Problem: Activity: Goal: Risk for activity intolerance will decrease Outcome: Progressing   Problem: Pain Management: Goal: General experience of comfort will improve Outcome: Progressing   Problem: Activity: Goal: Ability to tolerate increased activity will improve Outcome: Progressing

## 2023-01-31 NOTE — Plan of Care (Signed)
  Problem: Education: Goal: Knowledge of General Education information will improve Description: Including pain rating scale, medication(s)/side effects and non-pharmacologic comfort measures Outcome: Progressing   Problem: Health Behavior/Discharge Planning: Goal: Ability to manage health-related needs will improve Outcome: Progressing   Problem: Clinical Measurements: Goal: Ability to maintain clinical measurements within normal limits will improve Outcome: Progressing Goal: Will remain free from infection Outcome: Progressing Goal: Diagnostic test results will improve Outcome: Progressing Goal: Respiratory complications will improve Outcome: Progressing Goal: Cardiovascular complication will be avoided Outcome: Progressing   Problem: Activity: Goal: Risk for activity intolerance will decrease Outcome: Progressing   Problem: Nutrition: Goal: Adequate nutrition will be maintained Outcome: Progressing   Problem: Coping: Goal: Level of anxiety will decrease Outcome: Progressing   Problem: Elimination: Goal: Will not experience complications related to bowel motility Outcome: Progressing Goal: Will not experience complications related to urinary retention Outcome: Progressing   Problem: Pain Management: Goal: General experience of comfort will improve Outcome: Progressing   Problem: Safety: Goal: Ability to remain free from injury will improve Outcome: Progressing   Problem: Skin Integrity: Goal: Risk for impaired skin integrity will decrease Outcome: Progressing   Problem: Activity: Goal: Ability to tolerate increased activity will improve Outcome: Progressing   Problem: Education: Goal: Understanding of CV disease, CV risk reduction, and recovery process will improve Outcome: Progressing Goal: Individualized Educational Video(s) Outcome: Progressing   Problem: Activity: Goal: Ability to return to baseline activity level will improve Outcome:  Progressing   Problem: Cardiovascular: Goal: Ability to achieve and maintain adequate cardiovascular perfusion will improve Outcome: Progressing Goal: Vascular access site(s) Level 0-1 will be maintained Outcome: Progressing   Problem: Health Behavior/Discharge Planning: Goal: Ability to safely manage health-related needs after discharge will improve Outcome: Progressing

## 2023-01-31 NOTE — Plan of Care (Signed)
 Problem: Education: Goal: Knowledge of General Education information will improve Description: Including pain rating scale, medication(s)/side effects and non-pharmacologic comfort measures 01/31/2023 1542 by Wandra Delon POUR, RN Outcome: Adequate for Discharge 01/31/2023 1503 by Wandra Delon POUR, RN Outcome: Progressing   Problem: Health Behavior/Discharge Planning: Goal: Ability to manage health-related needs will improve 01/31/2023 1542 by Wandra Delon POUR, RN Outcome: Adequate for Discharge 01/31/2023 1503 by Wandra Delon POUR, RN Outcome: Progressing   Problem: Clinical Measurements: Goal: Ability to maintain clinical measurements within normal limits will improve 01/31/2023 1542 by Wandra Delon POUR, RN Outcome: Adequate for Discharge 01/31/2023 1503 by Wandra Delon POUR, RN Outcome: Progressing Goal: Will remain free from infection 01/31/2023 1542 by Wandra Delon POUR, RN Outcome: Adequate for Discharge 01/31/2023 1503 by Wandra Delon POUR, RN Outcome: Progressing Goal: Diagnostic test results will improve 01/31/2023 1542 by Wandra Delon POUR, RN Outcome: Adequate for Discharge 01/31/2023 1503 by Wandra Delon POUR, RN Outcome: Progressing Goal: Respiratory complications will improve 01/31/2023 1542 by Wandra Delon POUR, RN Outcome: Adequate for Discharge 01/31/2023 1503 by Wandra Delon POUR, RN Outcome: Progressing Goal: Cardiovascular complication will be avoided 01/31/2023 1542 by Wandra Delon POUR, RN Outcome: Adequate for Discharge 01/31/2023 1503 by Wandra Delon POUR, RN Outcome: Progressing   Problem: Activity: Goal: Risk for activity intolerance will decrease 01/31/2023 1542 by Wandra Delon POUR, RN Outcome: Adequate for Discharge 01/31/2023 1503 by Wandra Delon POUR, RN Outcome: Progressing   Problem: Nutrition: Goal: Adequate nutrition will be maintained 01/31/2023 1542 by Wandra Delon POUR, RN Outcome: Adequate for Discharge 01/31/2023 1503 by Wandra Delon POUR, RN Outcome: Progressing   Problem: Coping: Goal: Level of anxiety will decrease 01/31/2023 1542 by Wandra Delon POUR, RN Outcome: Adequate for Discharge 01/31/2023 1503 by Wandra Delon POUR, RN Outcome: Progressing   Problem: Elimination: Goal: Will not experience complications related to bowel motility 01/31/2023 1542 by Wandra Delon POUR, RN Outcome: Adequate for Discharge 01/31/2023 1503 by Wandra Delon POUR, RN Outcome: Progressing Goal: Will not experience complications related to urinary retention 01/31/2023 1542 by Wandra Delon POUR, RN Outcome: Adequate for Discharge 01/31/2023 1503 by Wandra Delon POUR, RN Outcome: Progressing   Problem: Pain Management: Goal: General experience of comfort will improve 01/31/2023 1542 by Wandra Delon POUR, RN Outcome: Adequate for Discharge 01/31/2023 1503 by Wandra Delon POUR, RN Outcome: Progressing   Problem: Safety: Goal: Ability to remain free from injury will improve 01/31/2023 1542 by Wandra Delon POUR, RN Outcome: Adequate for Discharge 01/31/2023 1503 by Wandra Delon POUR, RN Outcome: Progressing   Problem: Skin Integrity: Goal: Risk for impaired skin integrity will decrease 01/31/2023 1542 by Wandra Delon POUR, RN Outcome: Adequate for Discharge 01/31/2023 1503 by Wandra Delon POUR, RN Outcome: Progressing   Problem: Activity: Goal: Ability to tolerate increased activity will improve 01/31/2023 1542 by Wandra Delon POUR, RN Outcome: Adequate for Discharge 01/31/2023 1503 by Wandra Delon POUR, RN Outcome: Progressing   Problem: Education: Goal: Understanding of CV disease, CV risk reduction, and recovery process will improve 01/31/2023 1542 by Wandra Delon POUR, RN Outcome: Adequate for Discharge 01/31/2023 1503 by Wandra Delon POUR, RN Outcome: Progressing Goal: Individualized Educational Video(s) 01/31/2023 1542 by Wandra Delon POUR, RN Outcome: Adequate for Discharge 01/31/2023 1503 by Wandra Delon POUR, RN Outcome:  Progressing   Problem: Activity: Goal: Ability to return to baseline activity level will improve 01/31/2023 1542 by Wandra Delon POUR, RN Outcome: Adequate for Discharge 01/31/2023 1503 by Wandra Delon POUR, RN Outcome: Progressing   Problem: Cardiovascular: Goal: Ability  to achieve and maintain adequate cardiovascular perfusion will improve 01/31/2023 1542 by Wandra Delon POUR, RN Outcome: Adequate for Discharge 01/31/2023 1503 by Wandra Delon POUR, RN Outcome: Progressing Goal: Vascular access site(s) Level 0-1 will be maintained 01/31/2023 1542 by Wandra Delon POUR, RN Outcome: Adequate for Discharge 01/31/2023 1503 by Wandra Delon POUR, RN Outcome: Progressing   Problem: Health Behavior/Discharge Planning: Goal: Ability to safely manage health-related needs after discharge will improve 01/31/2023 1542 by Wandra Delon POUR, RN Outcome: Adequate for Discharge 01/31/2023 1503 by Wandra Delon POUR, RN Outcome: Progressing

## 2023-01-31 NOTE — Telephone Encounter (Addendum)
Patient Product/process development scientist completed.    The patient is insured through [facility]. Patient has Medicare and is not eligible for a copay card, but may be able to apply for patient assistance, if available.    Ran test claim for Entresto 24-26 mg and the current 30 day co-pay is $0.00.  Ran test claim for Farxiga 10 mg and the current 30 day co-pay is $0.00.  Ran test claim for Jardiance 10 mg and the current 30 day co-pay is $0.00.   This test claim was processed through [facility]- copay amounts may vary at other pharmacies due to pharmacy/plan contracts, or as the patient moves through the different stages of their insurance plan.     Roland Earl, CPHT Pharmacy Technician III Certified Patient Advocate [facility] Patient Advocate Team Direct Number: [phone]  Fax: [phone]

## 2023-01-31 NOTE — Progress Notes (Signed)
 12/31 Pt under Droplet and Airborne Precautions. I spoke to patient's sister(Gloria) via telephone and received verbal consent and acknowledgement of IMM Letter.

## 2023-02-02 ENCOUNTER — Encounter: Payer: Self-pay | Admitting: Vascular Surgery

## 2023-02-02 LAB — LIPOPROTEIN A (LPA): Lipoprotein (a): 232.7 nmol/L — ABNORMAL HIGH (ref <75.0)

## 2023-02-28 ENCOUNTER — Other Ambulatory Visit: Payer: Self-pay

## 2023-02-28 ENCOUNTER — Emergency Department: Payer: No Typology Code available for payment source

## 2023-02-28 ENCOUNTER — Inpatient Hospital Stay
Admission: EM | Admit: 2023-02-28 | Discharge: 2023-03-02 | DRG: 177 | Disposition: A | Discharge status: Home / Self Care | Payer: No Typology Code available for payment source | Attending: Internal Medicine | Admitting: Internal Medicine

## 2023-02-28 DIAGNOSIS — J9601 Acute respiratory failure with hypoxia: Secondary | ICD-10-CM | POA: Diagnosis present

## 2023-02-28 DIAGNOSIS — Z91041 Radiographic dye allergy status: Secondary | ICD-10-CM

## 2023-02-28 DIAGNOSIS — E785 Hyperlipidemia, unspecified: Secondary | ICD-10-CM | POA: Insufficient documentation

## 2023-02-28 DIAGNOSIS — Z825 Family history of asthma and other chronic lower respiratory diseases: Secondary | ICD-10-CM

## 2023-02-28 DIAGNOSIS — Z8249 Family history of ischemic heart disease and other diseases of the circulatory system: Secondary | ICD-10-CM

## 2023-02-28 DIAGNOSIS — Z7951 Long term (current) use of inhaled steroids: Secondary | ICD-10-CM | POA: Diagnosis not present

## 2023-02-28 DIAGNOSIS — U071 COVID-19: Secondary | ICD-10-CM | POA: Diagnosis present

## 2023-02-28 DIAGNOSIS — I110 Hypertensive heart disease with heart failure: Secondary | ICD-10-CM | POA: Diagnosis present

## 2023-02-28 DIAGNOSIS — I5032 Chronic diastolic (congestive) heart failure: Secondary | ICD-10-CM | POA: Diagnosis present

## 2023-02-28 DIAGNOSIS — Z7902 Long term (current) use of antithrombotics/antiplatelets: Secondary | ICD-10-CM

## 2023-02-28 DIAGNOSIS — Z79899 Other long term (current) drug therapy: Secondary | ICD-10-CM | POA: Diagnosis not present

## 2023-02-28 DIAGNOSIS — I493 Ventricular premature depolarization: Secondary | ICD-10-CM | POA: Diagnosis present

## 2023-02-28 DIAGNOSIS — Z888 Allergy status to other drugs, medicaments and biological substances status: Secondary | ICD-10-CM

## 2023-02-28 DIAGNOSIS — I10 Essential (primary) hypertension: Secondary | ICD-10-CM | POA: Insufficient documentation

## 2023-02-28 DIAGNOSIS — Z7982 Long term (current) use of aspirin: Secondary | ICD-10-CM | POA: Diagnosis not present

## 2023-02-28 DIAGNOSIS — R0602 Shortness of breath: Secondary | ICD-10-CM | POA: Diagnosis present

## 2023-02-28 DIAGNOSIS — Z7984 Long term (current) use of oral hypoglycemic drugs: Secondary | ICD-10-CM

## 2023-02-28 DIAGNOSIS — Z91040 Latex allergy status: Secondary | ICD-10-CM

## 2023-02-28 DIAGNOSIS — Z86718 Personal history of other venous thrombosis and embolism: Secondary | ICD-10-CM

## 2023-02-28 DIAGNOSIS — Z7901 Long term (current) use of anticoagulants: Secondary | ICD-10-CM | POA: Diagnosis not present

## 2023-02-28 DIAGNOSIS — I739 Peripheral vascular disease, unspecified: Secondary | ICD-10-CM | POA: Diagnosis present

## 2023-02-28 DIAGNOSIS — K219 Gastro-esophageal reflux disease without esophagitis: Secondary | ICD-10-CM | POA: Insufficient documentation

## 2023-02-28 DIAGNOSIS — J441 Chronic obstructive pulmonary disease with (acute) exacerbation: Secondary | ICD-10-CM | POA: Diagnosis present

## 2023-02-28 DIAGNOSIS — F17210 Nicotine dependence, cigarettes, uncomplicated: Secondary | ICD-10-CM | POA: Diagnosis present

## 2023-02-28 DIAGNOSIS — Z8674 Personal history of sudden cardiac arrest: Secondary | ICD-10-CM

## 2023-02-28 LAB — BASIC METABOLIC PANEL
Anion gap: 14 (ref 5–15)
BUN: 12 mg/dL (ref 8–23)
CO2: 26 mmol/L (ref 22–32)
Calcium: 8.9 mg/dL (ref 8.9–10.3)
Chloride: 102 mmol/L (ref 98–111)
Creatinine, Ser: 1.24 mg/dL (ref 0.61–1.24)
GFR, Estimated: >60 mL/min (ref >60)
Glucose, Bld: 100 mg/dL — ABNORMAL HIGH (ref 70–99)
Potassium: 4.3 mmol/L (ref 3.5–5.1)
Sodium: 142 mmol/L (ref 135–145)

## 2023-02-28 LAB — CBC
HCT: 40.3 % (ref 39.0–52.0)
Hemoglobin: 12.5 g/dL — ABNORMAL LOW (ref 13.0–17.0)
MCH: 28.2 pg (ref 26.0–34.0)
MCHC: 31 g/dL (ref 30.0–36.0)
MCV: 91 fL (ref 80.0–100.0)
Platelets: 217 10*3/uL (ref 150–400)
RBC: 4.43 MIL/uL (ref 4.22–5.81)
RDW: 13.2 % (ref 11.5–15.5)
WBC: 4.9 10*3/uL (ref 4.0–10.5)
nRBC: 0 % (ref 0.0–0.2)

## 2023-02-28 LAB — LACTIC ACID, PLASMA
Lactic Acid, Venous: 1.2 mmol/L (ref 0.5–1.9)
Lactic Acid, Venous: 1 mmol/L (ref 0.5–1.9)

## 2023-02-28 LAB — RESP PANEL BY RT-PCR (RSV, FLU A&B, COVID)  RVPGX2
Influenza A by PCR: NEGATIVE
Influenza B by PCR: NEGATIVE
Resp Syncytial Virus by PCR: NEGATIVE
SARS Coronavirus 2 by RT PCR: POSITIVE — AB

## 2023-02-28 LAB — BLOOD GAS, VENOUS
Acid-base deficit: 0.4 mmol/L (ref 0.0–2.0)
Bicarbonate: 24.8 mmol/L (ref 20.0–28.0)
O2 Saturation: 92.4 %
Patient temperature: 37
pCO2, Ven: 42 mm[Hg] — ABNORMAL LOW (ref 44–60)
pH, Ven: 7.38 (ref 7.25–7.43)
pO2, Ven: 60 mm[Hg] — ABNORMAL HIGH (ref 32–45)

## 2023-02-28 LAB — TROPONIN I (HIGH SENSITIVITY)
Troponin I (High Sensitivity): 16 ng/L (ref <18)
Troponin I (High Sensitivity): 17 ng/L (ref <18)

## 2023-02-28 LAB — BRAIN NATRIURETIC PEPTIDE: B Natriuretic Peptide: 488.6 pg/mL — ABNORMAL HIGH (ref 0.0–100.0)

## 2023-02-28 MED ORDER — MAGNESIUM SULFATE 2 GM/50ML IV SOLN
2.0000 g | Freq: Once | INTRAVENOUS | Status: AC
  Administered 2023-02-28: 2 g via INTRAVENOUS
  Filled 2023-02-28: qty 50

## 2023-02-28 MED ORDER — IPRATROPIUM-ALBUTEROL 0.5-2.5 (3) MG/3ML IN SOLN
3.0000 mL | Freq: Once | RESPIRATORY_TRACT | Status: AC
  Administered 2023-02-28: 3 mL via RESPIRATORY_TRACT
  Filled 2023-02-28: qty 3

## 2023-02-28 MED ORDER — ALBUTEROL SULFATE (2.5 MG/3ML) 0.083% IN NEBU
5.0000 mg | INHALATION_SOLUTION | Freq: Once | RESPIRATORY_TRACT | Status: AC
  Administered 2023-02-28: 5 mg via RESPIRATORY_TRACT
  Filled 2023-02-28: qty 6

## 2023-02-28 MED ORDER — METHYLPREDNISOLONE SODIUM SUCC 125 MG IJ SOLR
125.0000 mg | Freq: Once | INTRAMUSCULAR | Status: AC
  Administered 2023-02-28: 125 mg via INTRAVENOUS
  Filled 2023-02-28: qty 2

## 2023-02-28 NOTE — ED Notes (Signed)
Pt taken off BiPAP to trial room air work of breathing. Pt given graham crackers, peanut butter, and sprite.

## 2023-02-28 NOTE — ED Triage Notes (Signed)
Pt to ED via EMS from home for COPD exascerbation, current smoker, states smoking today brought on SOB. Currently getting duoneb from EMS. Got 125mg  solu medrol.  Coughing since 1 week and mid chest feels tight.   EMS VS: 210/101, HR 120, 100%, CBG 108.   Lungs very diminished/accessory muscle use/pursed lip breathing noted in triage.

## 2023-02-28 NOTE — ED Provider Notes (Signed)
[facility] Provider Note    Event Date/Time   First MD Initiated Contact with Patient 02/28/23 1736     (approximate)   History   Shortness of Breath   HPI Christopher Bryan. is a 63 y.o. male with history of HFpEF, COPD, HTN, DVT on Eliquis presenting today for shortness of breath.  Patient states for the past couple of days he has had worsening difficulty with breathing.  Today got significantly more worse and feels tightness in his chest.  Has been using his breathing treatments at home without significant relief.  Otherwise denies fever, chills, chest pain, nausea, vomiting.  Has had slight productive cough with unclear color to the sputum.  Denies any leg pain or leg swelling.  Has prior history of blood clots but has been taking his Eliquis as prescribed.  Chart review: Patient admitted in December 2024 for acute hypoxic respiratory distress requiring nonrebreather and eventual BiPAP.  Patient started become lethargic and had PEA arrest.     Physical Exam   Triage Vital Signs: ED Triage Vitals  Encounter Vitals Group     BP 02/28/23 1731 (!) 198/108     Systolic BP Percentile --      Diastolic BP Percentile --      Pulse Rate 02/28/23 1731 (!) 103     Resp 02/28/23 1731 (!) 24     Temp 02/28/23 1731 98.3 F (36.8 C)     Temp Source 02/28/23 1731 Oral     SpO2 02/28/23 1731 99 %     Weight 02/28/23 1726 125 lb (56.7 kg)     Height 02/28/23 1726 5\' 7"  (1.702 m)     Head Circumference --      Peak Flow --      Pain Score 02/28/23 1726 3     Pain Loc --      Pain Education --      Exclude from Growth Chart --     Most recent vital signs: Vitals:   02/28/23 2000 02/28/23 2030  BP: (!) 149/83 129/76  Pulse: 83 84  Resp:  (!) 22  Temp:    SpO2: 100% 96%   Physical Exam: I have reviewed the vital signs and nursing notes. General: Awake, alert, tachypneic and in mild respiratory distress. Head:  Atraumatic, normocephalic.   ENT:  EOM  intact, PERRL. Oral mucosa is pink and moist with no lesions. Neck: Neck is supple with full range of motion, No meningeal signs. Cardiovascular:  RRR, No murmurs. Peripheral pulses palpable and equal bilaterally. Respiratory: Poor inspiratory movement throughout all lung fields with lungs so tight that it is even difficult to hear wheezing.  Tachypneic.  Accessory muscle usage. Musculoskeletal:  No cyanosis or edema. Moving extremities with full ROM Abdomen:  Soft, nontender, nondistended. Neuro:  GCS 15, moving all four extremities, interacting appropriately. Speech clear. Psych:  Calm, appropriate.   Skin:  Warm, dry, no rash.    ED Results / Procedures / Treatments   Labs (all labs ordered are listed, but only abnormal results are displayed) Labs Reviewed  RESP PANEL BY RT-PCR (RSV, FLU A&B, COVID)  RVPGX2 - Abnormal; Notable for the following components:      Result Value   SARS Coronavirus 2 by RT PCR POSITIVE (*)    All other components within normal limits  BASIC METABOLIC PANEL - Abnormal; Notable for the following components:   Glucose, Bld 100 (*)    All other components within  normal limits  CBC - Abnormal; Notable for the following components:   Hemoglobin 12.5 (*)    All other components within normal limits  BLOOD GAS, VENOUS - Abnormal; Notable for the following components:   pCO2, Ven 42 (*)    pO2, Ven 60 (*)    All other components within normal limits  BRAIN NATRIURETIC PEPTIDE - Abnormal; Notable for the following components:   B Natriuretic Peptide 488.6 (*)    All other components within normal limits  LACTIC ACID, PLASMA  LACTIC ACID, PLASMA  TROPONIN I (HIGH SENSITIVITY)  TROPONIN I (HIGH SENSITIVITY)     EKG My EKG interpretation: Rate of 80, sinus rhythm with occasional PVC.  Normal axis, normal intervals.  No acute ST elevation or depression   RADIOLOGY Inability interpreted chest x-ray with no acute pathology   PROCEDURES:  Critical Care  performed: Yes, see critical care procedure note(s)  .Critical Care  Performed by: Janith Lima, MD Authorized by: Janith Lima, MD   Critical care provider statement:    Critical care time (minutes):  30   Critical care was necessary to treat or prevent imminent or life-threatening deterioration of the following conditions:  Respiratory failure   Critical care was time spent personally by me on the following activities:  Development of treatment plan with patient or surrogate, discussions with consultants, evaluation of patient's response to treatment, examination of patient, ordering and review of laboratory studies, ordering and review of radiographic studies, ordering and performing treatments and interventions, pulse oximetry, re-evaluation of patient's condition and review of old charts   I assumed direction of critical care for this patient from another provider in my specialty: no     Care discussed with: admitting provider      MEDICATIONS ORDERED IN ED: Medications  ipratropium-albuterol (DUONEB) 0.5-2.5 (3) MG/3ML nebulizer solution 3 mL (3 mLs Nebulization Given 02/28/23 1752)  ipratropium-albuterol (DUONEB) 0.5-2.5 (3) MG/3ML nebulizer solution 3 mL (3 mLs Nebulization Given 02/28/23 1752)  methylPREDNISolone sodium succinate (SOLU-MEDROL) 125 mg/2 mL injection 125 mg (125 mg Intravenous Given 02/28/23 1755)  magnesium sulfate IVPB 2 g 50 mL (0 g Intravenous Stopped 02/28/23 2037)  albuterol (PROVENTIL) (2.5 MG/3ML) 0.083% nebulizer solution 5 mg (5 mg Nebulization Given 02/28/23 1956)     IMPRESSION / MDM / ASSESSMENT AND PLAN / ED COURSE  I reviewed the triage vital signs and the nursing notes.                              Differential diagnosis includes, but is not limited to, COVID, flu, RSV, pneumonia, pneumothorax, COPD exacerbation, CHF exacerbation, less likely ACS  Patient's presentation is most consistent with acute presentation with potential threat to life or  bodily function.  Patient is a 63 year old male presenting today for shortness of breath.  On arrival, notably tachypneic and lung auscultation essentially notable for no air movement.  Could not hear even wheezing.  Oxygen at 87% on room air.  Placed on 6 L and started DuoNeb treatments.  Slight improvement in his breathing after that but still incredibly tight.  Transition to BiPAP to increase aeration and CO2 exchange has patient has had a recent history of hypercapnia resulting in intubation.  Laboratory workup for the most part reassuring.  Normal lactic.  BNP slightly elevated at 488 but diminished from prior levels.  Chest x-ray with no obvious consolidation.  Patient ultimately tested positive for COVID I suspect  this is the source of his COPD exacerbation.  Also received Solu-Medrol and magnesium in the ED.  Reassessed with improvement in breathing symptoms on BiPAP.  Given COVID with COPD exacerbation and on BiPAP, admitted to hospitalist for further care.  The patient is on the cardiac monitor to evaluate for evidence of arrhythmia and/or significant heart rate changes. Clinical Course as of 02/28/23 2138  Tue Feb 28, 2023  1943 Reassessed patient. Feeling much more comfortable on the BIPAP. Lungs do feel more  [DW]    Clinical Course User Index [DW] Janith Lima, MD     FINAL CLINICAL IMPRESSION(S) / ED DIAGNOSES   Final diagnoses:  Acute hypoxic respiratory failure (HCC)  COVID-19  COPD exacerbation (HCC)     Rx / DC Orders   ED Discharge Orders     None        Note:  This document was prepared using Dragon voice recognition software and may include unintentional dictation errors.   Janith Lima, MD 02/28/23 2141

## 2023-03-01 DIAGNOSIS — E785 Hyperlipidemia, unspecified: Secondary | ICD-10-CM | POA: Insufficient documentation

## 2023-03-01 DIAGNOSIS — J9601 Acute respiratory failure with hypoxia: Secondary | ICD-10-CM

## 2023-03-01 DIAGNOSIS — Z86718 Personal history of other venous thrombosis and embolism: Secondary | ICD-10-CM

## 2023-03-01 DIAGNOSIS — I739 Peripheral vascular disease, unspecified: Secondary | ICD-10-CM | POA: Insufficient documentation

## 2023-03-01 DIAGNOSIS — U071 COVID-19: Secondary | ICD-10-CM

## 2023-03-01 DIAGNOSIS — I10 Essential (primary) hypertension: Secondary | ICD-10-CM | POA: Insufficient documentation

## 2023-03-01 DIAGNOSIS — J441 Chronic obstructive pulmonary disease with (acute) exacerbation: Secondary | ICD-10-CM

## 2023-03-01 DIAGNOSIS — K219 Gastro-esophageal reflux disease without esophagitis: Secondary | ICD-10-CM | POA: Insufficient documentation

## 2023-03-01 LAB — CBG MONITORING, ED
Glucose-Capillary: 130 mg/dL — ABNORMAL HIGH (ref 70–99)
Glucose-Capillary: 130 mg/dL — ABNORMAL HIGH (ref 70–99)

## 2023-03-01 LAB — BASIC METABOLIC PANEL
Anion gap: 11 (ref 5–15)
BUN: 18 mg/dL (ref 8–23)
CO2: 25 mmol/L (ref 22–32)
Calcium: 8.7 mg/dL — ABNORMAL LOW (ref 8.9–10.3)
Chloride: 100 mmol/L (ref 98–111)
Creatinine, Ser: 1.16 mg/dL (ref 0.61–1.24)
GFR, Estimated: >60 mL/min (ref >60)
Glucose, Bld: 348 mg/dL — ABNORMAL HIGH (ref 70–99)
Potassium: 4.4 mmol/L (ref 3.5–5.1)
Sodium: 136 mmol/L (ref 135–145)

## 2023-03-01 LAB — HEMOGLOBIN A1C
Hgb A1c MFr Bld: 5.6 % (ref 4.8–5.6)
Mean Plasma Glucose: 114.02 mg/dL

## 2023-03-01 LAB — CBC
HCT: 35.2 % — ABNORMAL LOW (ref 39.0–52.0)
Hemoglobin: 11.4 g/dL — ABNORMAL LOW (ref 13.0–17.0)
MCH: 28.5 pg (ref 26.0–34.0)
MCHC: 32.4 g/dL (ref 30.0–36.0)
MCV: 88 fL (ref 80.0–100.0)
Platelets: 212 10*3/uL (ref 150–400)
RBC: 4 MIL/uL — ABNORMAL LOW (ref 4.22–5.81)
RDW: 13.2 % (ref 11.5–15.5)
WBC: 1.7 10*3/uL — ABNORMAL LOW (ref 4.0–10.5)
nRBC: 0 % (ref 0.0–0.2)

## 2023-03-01 MED ORDER — ACETAMINOPHEN 325 MG RE SUPP: 650.0000 mg | Freq: Four times a day (QID) | RECTAL | Status: DC | PRN

## 2023-03-01 MED ORDER — FAMOTIDINE 20 MG PO TABS: 20.0000 mg | ORAL_TABLET | Freq: Two times a day (BID) | ORAL | Status: DC | PRN

## 2023-03-01 MED ORDER — TRAZODONE HCL 50 MG PO TABS
25.0000 mg | ORAL_TABLET | Freq: Every evening | ORAL | Status: DC | PRN
Start: 2023-03-01 — End: 2023-03-02

## 2023-03-01 MED ORDER — DAPAGLIFLOZIN PROPANEDIOL 10 MG PO TABS
10.0000 mg | ORAL_TABLET | Freq: Every day | ORAL | Status: DC
  Filled 2023-03-01 (×2): qty 1

## 2023-03-01 MED ORDER — VITAMIN D 25 MCG (1000 UNIT) PO TABS
2000.0000 [IU] | ORAL_TABLET | Freq: Every day | ORAL | Status: DC
  Administered 2023-03-01 – 2023-03-02 (×2): 2000 [IU] via ORAL
  Filled 2023-03-01 (×2): qty 2

## 2023-03-01 MED ORDER — ACETAMINOPHEN 325 MG PO TABS: 650.0000 mg | ORAL_TABLET | Freq: Four times a day (QID) | ORAL | Status: DC | PRN

## 2023-03-01 MED ORDER — SENNOSIDES-DOCUSATE SODIUM 8.6-50 MG PO TABS: 1.0000 | ORAL_TABLET | Freq: Every evening | ORAL | Status: DC | PRN

## 2023-03-01 MED ORDER — MAGNESIUM HYDROXIDE 400 MG/5ML PO SUSP: 30.0000 mL | Freq: Every day | ORAL | Status: DC | PRN

## 2023-03-01 MED ORDER — CLOPIDOGREL BISULFATE 75 MG PO TABS
75.0000 mg | ORAL_TABLET | Freq: Every day | ORAL | Status: DC
  Administered 2023-03-01 – 2023-03-02 (×2): 75 mg via ORAL
  Filled 2023-03-01 (×2): qty 1

## 2023-03-01 MED ORDER — HYDROCOD POLI-CHLORPHE POLI ER 10-8 MG/5ML PO SUER: 5.0000 mL | Freq: Two times a day (BID) | ORAL | Status: DC | PRN

## 2023-03-01 MED ORDER — TAMSULOSIN HCL 0.4 MG PO CAPS
0.4000 mg | ORAL_CAPSULE | Freq: Every day | ORAL | Status: DC
  Administered 2023-03-01 – 2023-03-02 (×2): 0.4 mg via ORAL
  Filled 2023-03-01 (×2): qty 1

## 2023-03-01 MED ORDER — LIDOCAINE 5 % EX PTCH
1.0000 | MEDICATED_PATCH | CUTANEOUS | Status: DC
  Administered 2023-03-01 – 2023-03-02 (×2): 1 via TRANSDERMAL
  Filled 2023-03-01 (×2): qty 1

## 2023-03-01 MED ORDER — GUAIFENESIN ER 600 MG PO TB12
600.0000 mg | ORAL_TABLET | Freq: Two times a day (BID) | ORAL | Status: DC
  Administered 2023-03-01 – 2023-03-02 (×3): 600 mg via ORAL
  Filled 2023-03-01 (×3): qty 1

## 2023-03-01 MED ORDER — SODIUM CHLORIDE 0.9 % IV SOLN
1.0000 g | INTRAVENOUS | Status: DC
  Administered 2023-03-01: 1 g via INTRAVENOUS
  Filled 2023-03-01: qty 10

## 2023-03-01 MED ORDER — PANTOPRAZOLE SODIUM 40 MG PO TBEC
40.0000 mg | DELAYED_RELEASE_TABLET | Freq: Every day | ORAL | Status: DC
  Administered 2023-03-01 – 2023-03-02 (×2): 40 mg via ORAL
  Filled 2023-03-01 (×2): qty 1

## 2023-03-01 MED ORDER — LORATADINE 10 MG PO TABS: 10.0000 mg | ORAL_TABLET | Freq: Every day | ORAL | Status: DC | PRN

## 2023-03-01 MED ORDER — FUROSEMIDE 10 MG/ML IJ SOLN
40.0000 mg | Freq: Two times a day (BID) | INTRAMUSCULAR | Status: DC
  Administered 2023-03-01 – 2023-03-02 (×4): 40 mg via INTRAVENOUS
  Filled 2023-03-01 (×4): qty 4

## 2023-03-01 MED ORDER — MIRTAZAPINE 15 MG PO TABS
7.5000 mg | ORAL_TABLET | Freq: Every day | ORAL | Status: DC
  Administered 2023-03-01 (×2): 7.5 mg via ORAL
  Filled 2023-03-01 (×2): qty 1

## 2023-03-01 MED ORDER — INSULIN ASPART 100 UNIT/ML IJ SOLN: 0.0000 [IU] | Freq: Three times a day (TID) | INTRAMUSCULAR | Status: DC

## 2023-03-01 MED ORDER — ATORVASTATIN CALCIUM 20 MG PO TABS
80.0000 mg | ORAL_TABLET | Freq: Every day | ORAL | Status: DC
  Administered 2023-03-01 (×2): 80 mg via ORAL
  Filled 2023-03-01 (×2): qty 4

## 2023-03-01 MED ORDER — SACUBITRIL-VALSARTAN 49-51 MG PO TABS
1.0000 | ORAL_TABLET | Freq: Two times a day (BID) | ORAL | Status: DC
  Administered 2023-03-01 – 2023-03-02 (×2): 1 via ORAL
  Filled 2023-03-01 (×5): qty 1

## 2023-03-01 MED ORDER — TIOTROPIUM BROMIDE MONOHYDRATE 18 MCG IN CAPS: 18.0000 ug | ORAL_CAPSULE | Freq: Every day | RESPIRATORY_TRACT | Status: DC

## 2023-03-01 MED ORDER — THIAMINE HCL 100 MG PO TABS
100.0000 mg | ORAL_TABLET | Freq: Every day | ORAL | Status: DC
  Administered 2023-03-01 – 2023-03-02 (×2): 100 mg via ORAL
  Filled 2023-03-01 (×4): qty 1

## 2023-03-01 MED ORDER — UMECLIDINIUM BROMIDE 62.5 MCG/ACT IN AEPB
1.0000 | INHALATION_SPRAY | Freq: Every day | RESPIRATORY_TRACT | Status: DC
  Administered 2023-03-02: 1 via RESPIRATORY_TRACT
  Filled 2023-03-01 (×3): qty 7

## 2023-03-01 MED ORDER — APIXABAN 5 MG PO TABS
5.0000 mg | ORAL_TABLET | Freq: Two times a day (BID) | ORAL | Status: DC
  Administered 2023-03-01 – 2023-03-02 (×4): 5 mg via ORAL
  Filled 2023-03-01 (×4): qty 1

## 2023-03-01 MED ORDER — ONDANSETRON HCL 4 MG/2ML IJ SOLN: 4.0000 mg | Freq: Four times a day (QID) | INTRAMUSCULAR | Status: DC | PRN

## 2023-03-01 MED ORDER — CARVEDILOL 6.25 MG PO TABS
6.2500 mg | ORAL_TABLET | Freq: Two times a day (BID) | ORAL | Status: DC
  Administered 2023-03-01 – 2023-03-02 (×3): 6.25 mg via ORAL
  Filled 2023-03-01 (×3): qty 1

## 2023-03-01 MED ORDER — ENOXAPARIN SODIUM 40 MG/0.4ML IJ SOSY: 40.0000 mg | PREFILLED_SYRINGE | INTRAMUSCULAR | Status: DC

## 2023-03-01 MED ORDER — MELATONIN 5 MG PO TABS
5.0000 mg | ORAL_TABLET | Freq: Every day | ORAL | Status: DC
  Administered 2023-03-01 (×2): 5 mg via ORAL
  Filled 2023-03-01 (×2): qty 1

## 2023-03-01 MED ORDER — PREDNISONE 20 MG PO TABS
40.0000 mg | ORAL_TABLET | Freq: Every day | ORAL | Status: DC
  Administered 2023-03-02: 40 mg via ORAL
  Filled 2023-03-01: qty 2

## 2023-03-01 MED ORDER — IPRATROPIUM-ALBUTEROL 0.5-2.5 (3) MG/3ML IN SOLN
3.0000 mL | Freq: Four times a day (QID) | RESPIRATORY_TRACT | Status: DC
  Administered 2023-03-01 – 2023-03-02 (×6): 3 mL via RESPIRATORY_TRACT
  Filled 2023-03-01 (×6): qty 3

## 2023-03-01 MED ORDER — POLYVINYL ALCOHOL 1.4 % OP SOLN: 1.0000 [drp] | Freq: Four times a day (QID) | OPHTHALMIC | Status: DC | PRN

## 2023-03-01 MED ORDER — INSULIN ASPART 100 UNIT/ML IJ SOLN: 0.0000 [IU] | Freq: Every day | INTRAMUSCULAR | Status: DC

## 2023-03-01 MED ORDER — METHYLPREDNISOLONE SODIUM SUCC 40 MG IJ SOLR
40.0000 mg | Freq: Two times a day (BID) | INTRAMUSCULAR | Status: AC
  Administered 2023-03-01 (×2): 40 mg via INTRAVENOUS
  Filled 2023-03-01 (×2): qty 1

## 2023-03-01 MED ORDER — GABAPENTIN 300 MG PO CAPS
800.0000 mg | ORAL_CAPSULE | Freq: Three times a day (TID) | ORAL | Status: DC
  Administered 2023-03-01 – 2023-03-02 (×6): 800 mg via ORAL
  Filled 2023-03-01 (×6): qty 2

## 2023-03-01 MED ORDER — ASPIRIN 81 MG PO TBEC
81.0000 mg | DELAYED_RELEASE_TABLET | Freq: Every day | ORAL | Status: DC
  Administered 2023-03-01 – 2023-03-02 (×2): 81 mg via ORAL
  Filled 2023-03-01 (×2): qty 1

## 2023-03-01 MED ORDER — ONDANSETRON HCL 4 MG PO TABS: 4.0000 mg | ORAL_TABLET | Freq: Four times a day (QID) | ORAL | Status: DC | PRN

## 2023-03-01 NOTE — Assessment & Plan Note (Addendum)
-   We will place the patient IV steroid therapy with IV Solu-Medrol as well as nebulized bronchodilator therapy with duonebs q.i.d. and q.4 hours p.r.n.Marland Kitchen - Mucolytic therapy will be provided with Mucinex and antibiotic therapy with IV Rocephin. - We will continue Spiriva and hold off long-acting beta agonist.

## 2023-03-01 NOTE — Assessment & Plan Note (Signed)
-  We will continue aspirin and Plavix.

## 2023-03-01 NOTE — Assessment & Plan Note (Addendum)
-   Will continue diuresis with IV Lasix given elevated BNP. - We will continue [place], Coreg and Entresto.

## 2023-03-01 NOTE — Assessment & Plan Note (Signed)
-  We will continue Eliquis.

## 2023-03-01 NOTE — Assessment & Plan Note (Signed)
-   While this could be contributing to his acute hypoxic respiratory failure, it is likely that COPD patient is the main culprit. - The patient will be on steroids for COPD exacerbation that should help with hypoxia if related to COVID. - Conservative and symptomatic management will be provided for now.

## 2023-03-01 NOTE — Inpatient Diabetes Management (Addendum)
Inpatient Diabetes Program Recommendations  AACE/ADA: New Consensus Statement on Inpatient Glycemic Control   Target Ranges:  Prepandial:   less than 140 mg/dL      Peak postprandial:   less than 180 mg/dL (1-2 hours)      Critically ill patients:  140 - 180 mg/dL    Latest Reference Range & Units 02/28/23 17:32 03/01/23 04:56  Glucose 70 - 99 mg/dL 161 (H)  Solumedrol 096 @ 17:55 348 (H)  Solumedrol 40 mg @05 :00   Review of Glycemic Control  Diabetes history: No Outpatient Diabetes medications: NA; takes Farxiga 10 mg daily for CHF Current orders for Inpatient glycemic control: Farxiga 10 mg daily (for CHF); Solumedrol 40 mg Q12H (changing to Prednisone 40 mg QAM on 1/30)  Inpatient Diabetes Program Recommendations:    Insulin: Lab glucose 348 mg/dl at 0:45 am today.  While inpatient and ordered steroids, please consider ordering CBGs AC&HS and Novolog 0-9 units TID with meals and Novolog 0-5 units at bedtime.  HbgA1C: May want to consider ordering an A1C to evaluate glycemic control over the past 2-3 months.  Thanks, Orlando Penner, RN, MSN, CDCES Diabetes Coordinator Inpatient Diabetes Program [phone] (Team Pager from 8am to 5pm)

## 2023-03-01 NOTE — Assessment & Plan Note (Signed)
-   This is likely secondary to COPD acute exacerbation. - The patient be admitted to progressive unit bed. - Will continue BiPAP. - O2 protocol will be followed. - Management otherwise as below.

## 2023-03-01 NOTE — Assessment & Plan Note (Signed)
-   We will continue antihypertensive therapy.

## 2023-03-01 NOTE — Assessment & Plan Note (Signed)
-  We will continue statin therapy.

## 2023-03-01 NOTE — Progress Notes (Signed)
Progress Note   Patient: Christopher Bryan. ZOX:[id number] DOB: 29-Apr-1960 DOA: 02/28/2023     1 DOS: the patient was seen and examined on 03/01/2023   Brief hospital course: From HPI "Sidhant Helderman. is a 63 y.o. African-American male with medical history significant for COPD, hypertension and peripheral vascular disease, who presented to the emergency room with acute onset of worsening dyspnea with associated cough productive of yellowish sputum as well as wheezing for the last couple of days.  No fever or chills.  No nausea or vomiting or abdominal pain.  No chest pain or palpitations.  No dysuria, oliguria or hematuria or flank pain.  No bleeding diathesis.  No headache or dizziness or blurred vision.   ED Course: When the patient came to the ER, BP was 198/108 with heart rate of 103 and respiratory to 24, pulse symmetry was 96 to 100% on 40% FiO2 on BiPAP.  Venous blood gas revealed pH 7.38 and a bicarbonate of 24.8.  BMP was normal.  BN P was 488.6.  High sensitive troponin I was 16 and later 17.  Lactic acid was 1.2 and later 1.  CBC showed hemoglobin of 12.5 and hematocrit 40.3.  COVID-19 PCR came back positive and influenza A and B and RSV PCR came back negative EKG as reviewed by me : EKG showed normal sinus rhythm with a rate of 80 with occasional PVCs and T wave inversion inferolaterally. Imaging: Two-view chest x-ray showed hyperinflation with no acute cardiopulmonary disease.   The patient was given 2 DuoNebs and nebulized albuterol, 2 g IV magnesium sulfate and 125 mg of IV Solu-Medrol.  He will be admitted to a progressive unit bed for further evaluation and management.  "  Assessment and Plan:  Acute respiratory failure with hypoxia (HCC)-resolved - This is likely secondary to COPD acute exacerbation. - The patient be admitted to progressive unit bed. - Patient initially required BiPAP - O2 protocol will be followed. - Management otherwise as below.   COPD with acute  exacerbation (HCC) - Continue bronchodilator therapy Continue steroid therapy -IV ceftriaxone discontinued given COVID-19 as a possible precipitant of his COPD - We will continue Spiriva  Counseled on the need to follow-up with his PCP as well as pulmonologist  COVID-19 infection Continue symptomatic management Currently not requiring oxygen Continue Mucinex   Chronic diastolic CHF (congestive heart failure) (HCC) - Will continue diuresis with IV Lasix given elevated BNP. Continue Farxiga, Coreg and [place].   History of DVT (deep vein thrombosis) - We will continue Eliquis.   Peripheral arterial disease (HCC) - We will continue aspirin and Plavix.   GERD without esophagitis - Continue PPI therapy.   Dyslipidemia - We will continue statin therapy.   Essential hypertension - We will continue antihypertensive therapy.    DVT prophylaxis: Eliquis. Advanced Care Planning:  Code Status: full code. Family Communication: No family at bedside at this time  Disposition Plan: Back to previous home environment Consults called: none.  Subjective:  Patient seen and examined at bedside this morning Denies nausea vomiting abdominal pain chest pain  Having persistent uncontrollable cough  Physical Exam:  GENERAL: Elderly male laying in bed in some distress with persistent coughing EYES: Pupils equal, round, reactive to light and accommodation. No scleral icterus. Extraocular muscles intact.  HEENT: Head atraumatic, normocephalic. Oropharynx and nasopharynx clear.  NECK:  Supple, no jugular venous distention. No thyroid enlargement, no tenderness.  LUNGS: Diffuse wheezing bilaterally CARDIOVASCULAR: Regular rate and rhythm, S1,  S2 normal. No murmurs, rubs, or gallops.  ABDOMEN: Soft, nondistended, nontender. Bowel sounds present. No organomegaly or mass.  EXTREMITIES: No pedal edema, cyanosis, or clubbing.  NEUROLOGIC: Cranial nerves II through XII are intact. Muscle strength  5/5 in all extremities. Sensation intact. Gait not checked.  PSYCHIATRIC: The patient is alert and oriented x 3.  Normal affect and good eye contact. SKIN: No obvious rash, lesion, or ulcer.  Vitals:   03/01/23 1643 03/01/23 1700 03/01/23 1730 03/01/23 1800  BP: 132/63 (!) 145/71 (!) 150/75 (!) 132/58  Pulse: 85 87 96 88  Resp: 19 20 16 16   Temp: 98.8 F (37.1 C)     TempSrc: Oral     SpO2: 96% 97% 92% 97%  Weight:      Height:        Data Reviewed: Chest x-ray reviewed that did not show any infiltrates    Latest Ref Rng & Units 03/01/2023    4:56 AM 02/28/2023    5:32 PM 01/31/2023    4:20 AM  BMP  Glucose 70 - 99 mg/dL 161  096  045   BUN 8 - 23 mg/dL 18  12  17    Creatinine 0.61 - 1.24 mg/dL 4.09  8.11  9.14   Sodium 135 - 145 mmol/L 136  142  140   Potassium 3.5 - 5.1 mmol/L 4.4  4.3  3.8   Chloride 98 - 111 mmol/L 100  102  110   CO2 22 - 32 mmol/L 25  26  25    Calcium 8.9 - 10.3 mg/dL 8.7  8.9  8.4        Latest Ref Rng & Units 03/01/2023    4:56 AM 02/28/2023    5:32 PM 01/31/2023    4:20 AM  CBC  WBC 4.0 - 10.5 K/uL 1.7  4.9  7.3   Hemoglobin 13.0 - 17.0 g/dL 78.2  95.6  21.3   Hematocrit 39.0 - 52.0 % 35.2  40.3  31.9   Platelets 150 - 400 K/uL 212  217  178      Time spent: 56 minutes  Author: Loyce Dys, MD 03/01/2023 6:26 PM  For on call review [email or web address].   Addendum: Patient not requiring oxygen

## 2023-03-01 NOTE — Assessment & Plan Note (Signed)
Continue PPI therapy.

## 2023-03-01 NOTE — H&P (Addendum)
[place]   PATIENT NAME: Christopher Bryan    MR#:  [id number]  DATE OF BIRTH:  1960-07-13  DATE OF ADMISSION:  02/28/2023  PRIMARY CARE PHYSICIAN: [facility]   Patient is coming from: Home  REQUESTING/REFERRING PHYSICIAN: Claudell Kyle, MD  CHIEF COMPLAINT:   Chief Complaint  Patient presents with   Shortness of Breath    HISTORY OF PRESENT ILLNESS:  Christopher Bryan. is a 63 y.o. African-American male with medical history significant for COPD, hypertension and peripheral vascular disease, who presented to the emergency room with acute onset of worsening dyspnea with associated cough productive of yellowish sputum as well as wheezing for the last couple of days.  No fever or chills.  No nausea or vomiting or abdominal pain.  No chest pain or palpitations.  No dysuria, oliguria or hematuria or flank pain.  No bleeding diathesis.  No headache or dizziness or blurred vision.  ED Course: When the patient came to the ER, BP was 198/108 with heart rate of 103 and respiratory to 24, pulse symmetry was 96 to 100% on 40% FiO2 on BiPAP.  Venous blood gas revealed pH 7.38 and a bicarbonate of 24.8.  BMP was normal.  BN P was 488.6.  High sensitive troponin I was 16 and later 17.  Lactic acid was 1.2 and later 1.  CBC showed hemoglobin of 12.5 and hematocrit 40.3.  COVID-19 PCR came back positive and influenza A and B and RSV PCR came back negative EKG as reviewed by me : EKG showed normal sinus rhythm with a rate of 80 with occasional PVCs and T wave inversion inferolaterally. Imaging: Two-view chest x-ray showed hyperinflation with no acute cardiopulmonary disease.  The patient was given 2 DuoNebs and nebulized albuterol, 2 g IV magnesium sulfate and 125 mg of IV Solu-Medrol.  He will be admitted to a progressive unit bed for further evaluation and management.  African-American PAST MEDICAL HISTORY:   Past Medical History:  Diagnosis Date   COPD (chronic obstructive  pulmonary disease) (HCC)    DVT (deep venous thrombosis) (HCC)    Hypertension    Peripheral arterial disease (HCC)     PAST SURGICAL HISTORY:   Past Surgical History:  Procedure Laterality Date   KNEE ARTHROSCOPY Left    LOWER EXTREMITY ANGIOGRAPHY Left 01/21/2020   Procedure: Lower Extremity Angiography;  Surgeon: Renford Dills, MD;  Location: [facility] INVASIVE [facility];  Service: Cardiovascular;  Laterality: Left;   LOWER EXTREMITY ANGIOGRAPHY Left 09/08/2021   Procedure: Lower Extremity Angiography;  Surgeon: Annice Needy, MD;  Location: [facility];  Service: Cardiovascular;  Laterality: Left;   LOWER EXTREMITY ANGIOGRAPHY Left 06/20/2022   Procedure: Lower Extremity Angiography;  Surgeon: Annice Needy, MD;  Location: [facility];  Service: Cardiovascular;  Laterality: Left;   ORIF FEMUR FRACTURE Right    RIGHT/LEFT HEART CATH AND CORONARY ANGIOGRAPHY N/A 01/30/2023   Procedure: RIGHT/LEFT HEART CATH AND CORONARY ANGIOGRAPHY;  Surgeon: Yvonne Kendall, MD;  Location: [facility];  Service: Cardiovascular;  Laterality: N/A;    SOCIAL HISTORY:   Social History   Tobacco Use   Smoking status: Every Day    Current packs/day: 0.50    Average packs/day: 0.5 packs/day for 40.0 years (20.0 ttl pk-yrs)    Types: Cigarettes   Smokeless tobacco: Never  Substance Use Topics   Alcohol use: Not Currently    Comment: 0    FAMILY  HISTORY:   Family History  Problem Relation Age of Onset   CAD Mother    Emphysema Father     DRUG ALLERGIES:   Allergies  Allergen Reactions   Iodine Rash   Ivp Dye [Iodinated Contrast Media] Hives   Latex Hives and Itching   Iodinated Contrast Media Hives and Rash   Other Itching and Rash    [place] Meat allergy per patient    REVIEW OF SYSTEMS:   ROS As per history of present illness. All pertinent systems were reviewed above. Constitutional, HEENT, cardiovascular, respiratory, GI, GU, musculoskeletal, neuro,  psychiatric, endocrine, integumentary and hematologic systems were reviewed and are otherwise negative/unremarkable except for positive findings mentioned above in the HPI.   MEDICATIONS AT HOME:   Prior to Admission medications   Medication Sig Start Date End Date Taking? Authorizing Provider  acetaminophen (TYLENOL) 325 MG tablet Take 650 mg by mouth 3 (three) times daily as needed for mild pain or fever.    [provider]  albuterol (PROVENTIL HFA;VENTOLIN HFA) 108 (90 Base) MCG/ACT inhaler Inhale 2 puffs into the lungs every 6 (six) hours as needed for wheezing or shortness of breath.    [provider]  albuterol (VENTOLIN HFA) 108 (90 Base) MCG/ACT inhaler Inhale 1-2 puffs into the lungs every 6 (six) hours as needed for wheezing or shortness of breath.    [provider]  apixaban (ELIQUIS) 5 MG TABS tablet Take 1 tablet (5 mg total) by mouth 2 (two) times daily. 06/24/22   Georgiana Spinner, NP  apixaban (ELIQUIS) 5 MG TABS tablet Take 5 mg by mouth 2 (two) times daily.    [provider]  aspirin EC 81 MG tablet Take 1 tablet (81 mg total) by mouth daily. Swallow whole. 06/24/22   Georgiana Spinner, NP  atorvastatin (LIPITOR) 40 MG tablet Take 20 mg by mouth at bedtime.    [provider]  atorvastatin (LIPITOR) 80 MG tablet Take 80 mg by mouth at bedtime.    [provider]  carboxymethylcellulose (REFRESH PLUS) 0.5 % SOLN Place 1 drop into both eyes 4 (four) times daily as needed. 09/09/21   Alford Highland, MD  carvedilol (COREG) 12.5 MG tablet Take 12.5 mg by mouth 2 (two) times daily with a meal.    [provider]  carvedilol (COREG) 6.25 MG tablet Take 6.25 mg by mouth 2 (two) times daily with a meal.    [provider]  cholecalciferol (VITAMIN D) 1000 units tablet Take 2,000 Units by mouth daily.    [provider]  cholecalciferol (VITAMIN D3) 25 MCG (1000 UNIT) tablet Take 2,000 Units by mouth daily.     [provider]  clopidogrel (PLAVIX) 75 MG tablet Take 1 tablet (75 mg total) by mouth daily. 09/10/21   Alford Highland, MD  clopidogrel (PLAVIX) 75 MG tablet Take 75 mg by mouth daily.    [provider]  dapagliflozin propanediol (FARXIGA) 10 MG TABS tablet Take 1 tablet (10 mg total) by mouth daily. 02/01/23 03/03/23  Charise Killian, MD  diclofenac Sodium (VOLTAREN) 1 % GEL Apply 2 g topically 3 (three) times daily.    [provider]  famotidine (PEPCID) 20 MG tablet Take 20 mg by mouth 2 (two) times daily as needed for heartburn or indigestion.    [provider]  fluticasone-salmeterol (ADVAIR) 250-50 MCG/ACT AEPB Inhale 1 puff into the lungs in the morning and at bedtime.    [provider]  fluticasone-salmeterol (ADVAIR) 250-50 MCG/ACT AEPB Inhale 1 puff into the lungs in the morning and at bedtime.    [provider]  gabapentin (NEURONTIN) 400 MG capsule Take 1 capsule (400 mg total) by mouth 3 (three) times daily for 20 days. Patient taking differently: Take 800 mg by mouth 3 (three) times daily. 01/24/20 09/07/21  Tresa Moore, MD  hydrOXYzine (ATARAX) 25 MG tablet Take 25 mg by mouth 3 (three) times daily as needed (sleep).    [provider]  lidocaine (LIDODERM) 5 % Place 1 patch onto the skin daily. Remove & Discard patch within 12 hours or as directed by MD    [provider]  lisinopril (ZESTRIL) 20 MG tablet Take 20 mg by mouth daily.    [provider]  loratadine (CLARITIN) 10 MG tablet Take 10 mg by mouth daily as needed for allergies.    [provider]  melatonin 3 MG TABS tablet Take 3 mg by mouth at bedtime.    [provider]  methocarbamol (ROBAXIN) 500 MG tablet Take 1 tablet (500 mg total) by mouth every 8 (eight) hours as needed for muscle spasms. 11/05/22   Janith Lima, MD  methocarbamol (ROBAXIN) 500 MG tablet Take 500 mg by mouth every 8 (eight) hours as  needed for muscle spasms.    [provider]  mirtazapine (REMERON) 7.5 MG tablet Take 7.5 mg by mouth at bedtime.    [provider]  oxyCODONE-acetaminophen (PERCOCET/ROXICET) 5-325 MG tablet Take 1-2 tablets by mouth every 6 (six) hours as needed for moderate pain or severe pain. 06/24/22   Georgiana Spinner, NP  pantoprazole (PROTONIX) 20 MG tablet Take 20 mg by mouth daily.    [provider]  pantoprazole (PROTONIX) 40 MG tablet Take 40 mg by mouth daily.    [provider]  sacubitril-valsartan (ENTRESTO) 49-51 MG Take 1 tablet by mouth 2 (two) times daily. 01/31/23 03/02/23  Charise Killian, MD  sennosides-docusate sodium (SENOKOT-S) 8.6-50 MG tablet Take 1-2 tablets by mouth at bedtime as needed for constipation.    [provider]  sildenafil (VIAGRA) 100 MG tablet Take 100 mg by mouth daily as needed for erectile dysfunction.    [provider]  tamsulosin (FLOMAX) 0.4 MG CAPS capsule Take 0.4 mg by mouth daily after breakfast.    [provider]  tamsulosin (FLOMAX) 0.4 MG CAPS capsule Take 0.4 mg by mouth at bedtime.    [provider]  thiamine (VITAMIN B1) 100 MG tablet Take 100 mg by mouth daily.    [provider]  tiotropium (SPIRIVA) 18 MCG inhalation capsule Place 18 mcg into inhaler and inhale daily.    [provider]  Tiotropium Bromide Monohydrate (SPIRIVA RESPIMAT) 1.25 MCG/ACT AERS Inhale 1 spray into the lungs daily.    [provider]      VITAL SIGNS:  Blood pressure 128/69, pulse 69, temperature 97.6 F (36.4 C), temperature source Oral, resp. rate 16, height 5\' 7"  (1.702 m), weight 56.7 kg, SpO2 99%.  PHYSICAL EXAMINATION:  Physical Exam  GENERAL: Acutely ill 63 y.o.-year-old African-American male patient lying in the bed with mild to moderate respiratory distress with conversational dyspnea on BiPAP. EYES: Pupils equal, round, reactive to light and accommodation.  No scleral icterus. Extraocular muscles intact.  HEENT: Head atraumatic, normocephalic. Oropharynx and nasopharynx clear.  NECK:  Supple, no jugular venous distention. No thyroid enlargement, no tenderness.  LUNGS: Diffuse expiratory wheezes with tight expiratory airflow  and harsh vesicular breathing.  No use of accessory muscles of respiration.  CARDIOVASCULAR: Regular rate and rhythm, S1, S2 normal. No murmurs, rubs, or gallops.  ABDOMEN: Soft, nondistended, nontender. Bowel sounds present. No organomegaly or mass.  EXTREMITIES: No pedal edema, cyanosis, or clubbing.  NEUROLOGIC: Cranial nerves II through XII are intact. Muscle strength 5/5 in all extremities. Sensation intact. Gait not checked.  PSYCHIATRIC: The patient is alert and oriented x 3.  Normal affect and good eye contact. SKIN: No obvious rash, lesion, or ulcer.   LABORATORY PANEL:   CBC Recent Labs  Lab 03/01/23 0456  WBC 1.7*  HGB 11.4*  HCT 35.2*  PLT 212   ------------------------------------------------------------------------------------------------------------------  Chemistries  Recent Labs  Lab 03/01/23 0456  NA 136  K 4.4  CL 100  CO2 25  GLUCOSE 348*  BUN 18  CREATININE 1.16  CALCIUM 8.7*   ------------------------------------------------------------------------------------------------------------------  Cardiac Enzymes No results for input(s): "TROPONINI" in the last 168 hours. ------------------------------------------------------------------------------------------------------------------  RADIOLOGY:  DG Chest 2 View Result Date: 02/28/2023 CLINICAL DATA:  Shortness of breath EXAM: CHEST - 2 VIEW COMPARISON:  X-ray 01/26/2023. FINDINGS: Hyperinflation. No consolidation, pneumothorax or effusion. No edema. Normal cardiopericardial silhouette. Calcified aorta. IMPRESSION: Hyperinflation.  No acute cardiopulmonary disease. Electronically Signed   By: Karen Kays M.D.   On: 02/28/2023 18:16       IMPRESSION AND PLAN:  Assessment and Plan: * Acute respiratory failure with hypoxia (HCC) - This is likely secondary to COPD acute exacerbation. - The patient be admitted to progressive unit bed. - Will continue BiPAP. - O2 protocol will be followed. - Management otherwise as below.  COPD with acute exacerbation (HCC) - We will place the patient IV steroid therapy with IV Solu-Medrol as well as nebulized bronchodilator therapy with duonebs q.i.d. and q.4 hours p.r.n.Marland Kitchen - Mucolytic therapy will be provided with Mucinex and antibiotic therapy with IV Rocephin. - We will continue Spiriva and hold off long-acting beta agonist.    COVID-19 virus infection - While this could be contributing to his acute hypoxic respiratory failure, it is likely that COPD patient is the main culprit. - The patient will be on steroids for COPD exacerbation that should help with hypoxia if related to COVID. - Conservative and symptomatic management will be provided for now.  Chronic diastolic CHF (congestive heart failure) (HCC) - Will continue diuresis with IV Lasix given elevated BNP. - We will continue [place], Coreg and Entresto.  History of DVT (deep vein thrombosis) - We will continue Eliquis.  Peripheral arterial disease (HCC) - We will continue aspirin and Plavix.  GERD without esophagitis - Continue PPI therapy.  Dyslipidemia - We will continue statin therapy.  Essential hypertension - We will continue antihypertensive therapy.       DVT prophylaxis: Eliquis. Advanced Care Planning:  Code Status: full code. Family Communication:  The plan of care was discussed in details with the patient (and family). I answered all questions. The patient agreed to proceed with the above mentioned plan. Further management will depend upon hospital course. Disposition Plan: Back to previous home environment Consults called: none. All the records are reviewed and case discussed with ED  provider.  Status is: Inpatient   At the time of the admission, it appears that the appropriate admission status for this patient is inpatient.  This is judged to be reasonable and necessary in order to provide the required intensity of service to ensure the patient's safety given the presenting symptoms, physical exam findings and  initial radiographic and laboratory data in the context of comorbid conditions.  The patient requires inpatient status due to high intensity of service, high risk of further deterioration and high frequency of surveillance required.  I certify that at the time of admission, it is my clinical judgment that the patient will require inpatient hospital care extending more than 2 midnights.                            Dispo: The patient is from: Home              Anticipated d/c is to: Home              Patient currently is not medically stable to d/c.              Difficult to place patient: No  Authorized and performed by: Valente David, MD Total critical care time:   55     minutes. Due to a high probability of clinically significant, life-threatening deterioration, the patient required my highest level of preparedness to intervene emergently and I personally spent this critical care time directly and personally managing the patient.  This critical care time included obtaining a history, examining the patient, pulse oximetry, ordering and review of studies, arranging urgent treatment with development of management plan, evaluation of patient's response to treatment, frequent reassessment, and discussions with other providers. This critical care time was performed to assess and manage the high probability of imminent, life-threatening deterioration that could result in multiorgan failure.  It was exclusive of separately billable procedures and treating other patients and teaching time.   Hannah Beat M.D on 03/01/2023 at 10:05 PM  [facility]   From 7 PM-7 AM, contact  night-coverage [email or web address]  CC: Primary care physician; [facility]

## 2023-03-02 ENCOUNTER — Encounter: Payer: Self-pay | Admitting: Family Medicine

## 2023-03-02 DIAGNOSIS — J9601 Acute respiratory failure with hypoxia: Secondary | ICD-10-CM | POA: Diagnosis not present

## 2023-03-02 LAB — CBC WITH DIFFERENTIAL/PLATELET
Abs Immature Granulocytes: 0.04 10*3/uL (ref 0.00–0.07)
Basophils Absolute: 0 10*3/uL (ref 0.0–0.1)
Basophils Relative: 0 %
Eosinophils Absolute: 0 10*3/uL (ref 0.0–0.5)
Eosinophils Relative: 0 %
HCT: 39.7 % (ref 39.0–52.0)
Hemoglobin: 12.5 g/dL — ABNORMAL LOW (ref 13.0–17.0)
Immature Granulocytes: 0 %
Lymphocytes Relative: 8 %
Lymphs Abs: 0.8 10*3/uL (ref 0.7–4.0)
MCH: 27.9 pg (ref 26.0–34.0)
MCHC: 31.5 g/dL (ref 30.0–36.0)
MCV: 88.6 fL (ref 80.0–100.0)
Monocytes Absolute: 0.6 10*3/uL (ref 0.1–1.0)
Monocytes Relative: 7 %
Neutro Abs: 8.1 10*3/uL — ABNORMAL HIGH (ref 1.7–7.7)
Neutrophils Relative %: 85 %
Platelets: 243 10*3/uL (ref 150–400)
RBC: 4.48 MIL/uL (ref 4.22–5.81)
RDW: 13.3 % (ref 11.5–15.5)
WBC: 9.6 10*3/uL (ref 4.0–10.5)
nRBC: 0 % (ref 0.0–0.2)

## 2023-03-02 LAB — BASIC METABOLIC PANEL
Anion gap: 9 (ref 5–15)
BUN: 20 mg/dL (ref 8–23)
CO2: 27 mmol/L (ref 22–32)
Calcium: 9.1 mg/dL (ref 8.9–10.3)
Chloride: 104 mmol/L (ref 98–111)
Creatinine, Ser: 0.93 mg/dL (ref 0.61–1.24)
GFR, Estimated: >60 mL/min (ref >60)
Glucose, Bld: 136 mg/dL — ABNORMAL HIGH (ref 70–99)
Potassium: 4.2 mmol/L (ref 3.5–5.1)
Sodium: 140 mmol/L (ref 135–145)

## 2023-03-02 LAB — CBG MONITORING, ED
Glucose-Capillary: 101 mg/dL — ABNORMAL HIGH (ref 70–99)
Glucose-Capillary: 111 mg/dL — ABNORMAL HIGH (ref 70–99)

## 2023-03-02 MED ORDER — PREDNISONE 20 MG PO TABS: ORAL_TABLET | ORAL | 0 refills | Status: AC

## 2023-03-02 MED ORDER — GUAIFENESIN ER 600 MG PO TB12: 600.0000 mg | ORAL_TABLET | Freq: Two times a day (BID) | ORAL | 0 refills | Status: AC

## 2023-03-02 MED ORDER — PREDNISONE 20 MG PO TABS: ORAL_TABLET | ORAL | 0 refills | Status: DC

## 2023-03-02 MED ORDER — GUAIFENESIN ER 600 MG PO TB12: 600.0000 mg | ORAL_TABLET | Freq: Two times a day (BID) | ORAL | 0 refills | Status: DC

## 2023-03-02 NOTE — ED Notes (Signed)
Pt ambulatory to restroom

## 2023-03-02 NOTE — Care Management Important Message (Signed)
Important Message  Patient Details  Name: Christopher Bryan. MRN: [id number] Date of Birth: 11/07/1960   Important Message Given:  Yes - Medicare IM     Sherilyn Banker 03/02/2023, 11:53 AM

## 2023-03-02 NOTE — ED Notes (Signed)
Provided breakfast tray

## 2023-03-02 NOTE — Discharge Summary (Signed)
Physician Discharge Summary   Patient: Christopher Bryan. MRN: [id number] DOB: 1960-12-30  Admit date:     02/28/2023  Discharge date: 03/02/23  Discharge Physician: Loyce Dys   PCP: [facility]   Recommendations at discharge:  Follow-up with PCP  Discharge Diagnoses:  Acute respiratory failure with hypoxia (HCC)-resolved COPD with acute exacerbation (HCC) COVID-19 infection Chronic diastolic CHF (congestive heart failure) (HCC) History of DVT (deep vein thrombosis) Peripheral arterial disease (HCC) GERD without esophagitis Dyslipidemia Essential hypertension   Hospital Course: From HPI "[name]. is a 63 y.o. African-American male with medical history significant for COPD, hypertension and peripheral vascular disease, who presented to the emergency room with acute onset of worsening dyspnea with associated cough productive of yellowish sputum as well as wheezing for the last couple of days.  No fever or chills.  No nausea or vomiting or abdominal pain.  No chest pain or palpitations.  No dysuria, oliguria or hematuria or flank pain.  No bleeding diathesis.  No headache or dizziness or blurred vision.   ED Course: When the patient came to the ER, BP was 198/108 with heart rate of 103 and respiratory to 24, pulse symmetry was 96 to 100% on 40% FiO2 on BiPAP.  Venous blood gas revealed pH 7.38 and a bicarbonate of 24.8.  BMP was normal.  BN P was 488.6.  High sensitive troponin I was 16 and later 17.  Lactic acid was 1.2 and later 1.  CBC showed hemoglobin of 12.5 and hematocrit 40.3.  COVID-19 PCR came back positive and influenza A and B and RSV PCR came back negative EKG as reviewed by me : EKG showed normal sinus rhythm with a rate of 80 with occasional PVCs and T wave inversion inferolaterally. Imaging: Chest x-ray did not show any evidence of infiltrate or edema  Patient was transition of BiPAP with no oxygen requirement afterwards. He maintained room  air for 48 hours.  Wheezing resolved. Today when I looked inside patient's window before I entered his room he was laying in bed with no signs of difficulty breathing.  The moment I entered patient's room he started coughing and complaining to me that he does not want to go home today even before I discussed discharge with him.  Patient able to ambulate to the restroom and back with no evidence of desaturation.  I reviewed his chest imaging with him as well as no oxygen requirement and the fact that all of the treatment he is receiving now he will be able to take at home and therefore decision was made to discharge him today to follow-up as an outpatient.    Consultants: None Procedures performed: None Disposition: Home Diet recommendation:  Cardiac diet DISCHARGE MEDICATION: Allergies as of 03/02/2023       Reactions   Iodine Rash   Ivp Dye [iodinated Contrast Media] Hives   Latex Hives, Itching   Iodinated Contrast Media Hives, Rash   Other Itching, Rash   [place] Meat allergy per patient        Medication List     STOP taking these medications    lisinopril 20 MG tablet Commonly known as: ZESTRIL   loratadine 10 MG tablet Commonly known as: CLARITIN       TAKE these medications    acetaminophen 325 MG tablet Commonly known as: TYLENOL Take 650 mg by mouth 3 (three) times daily as needed for mild pain or fever.   albuterol 108 (90  Base) MCG/ACT inhaler Commonly known as: VENTOLIN HFA Inhale 2 puffs into the lungs every 6 (six) hours as needed for wheezing or shortness of breath. What changed: Another medication with the same name was removed. Continue taking this medication, and follow the directions you see here.   apixaban 5 MG Tabs tablet Commonly known as: ELIQUIS Take 1 tablet (5 mg total) by mouth 2 (two) times daily. What changed: Another medication with the same name was removed. Continue taking this medication, and follow the directions you see here.    aspirin EC 81 MG tablet Take 1 tablet (81 mg total) by mouth daily. Swallow whole.   atorvastatin 40 MG tablet Commonly known as: LIPITOR Take 20 mg by mouth at bedtime. What changed: Another medication with the same name was removed. Continue taking this medication, and follow the directions you see here.   carboxymethylcellulose 0.5 % Soln Commonly known as: REFRESH PLUS Place 1 drop into both eyes 4 (four) times daily as needed.   carvedilol 6.25 MG tablet Commonly known as: COREG Take 6.25 mg by mouth 2 (two) times daily with a meal. What changed: Another medication with the same name was removed. Continue taking this medication, and follow the directions you see here.   cholecalciferol 1000 units tablet Commonly known as: VITAMIN D Take 2,000 Units by mouth daily. What changed: Another medication with the same name was removed. Continue taking this medication, and follow the directions you see here.   clopidogrel 75 MG tablet Commonly known as: PLAVIX Take 1 tablet (75 mg total) by mouth daily. What changed: Another medication with the same name was removed. Continue taking this medication, and follow the directions you see here.   dapagliflozin propanediol 10 MG Tabs tablet Commonly known as: FARXIGA Take 1 tablet (10 mg total) by mouth daily.   diclofenac Sodium 1 % Gel Commonly known as: VOLTAREN Apply 2 g topically 3 (three) times daily.   famotidine 20 MG tablet Commonly known as: PEPCID Take 20 mg by mouth 2 (two) times daily as needed for heartburn or indigestion.   fluticasone-salmeterol 250-50 MCG/ACT Aepb Commonly known as: ADVAIR Inhale 1 puff into the lungs in the morning and at bedtime. What changed: Another medication with the same name was removed. Continue taking this medication, and follow the directions you see here.   gabapentin 400 MG capsule Commonly known as: NEURONTIN Take 1 capsule (400 mg total) by mouth 3 (three) times daily for 20  days. What changed: how much to take   guaiFENesin 600 MG 12 hr tablet Commonly known as: Mucinex Take 1 tablet (600 mg total) by mouth 2 (two) times daily for 10 days.   hydrOXYzine 25 MG tablet Commonly known as: ATARAX Take 25 mg by mouth 3 (three) times daily as needed (sleep).   lidocaine 5 % Commonly known as: LIDODERM Place 1 patch onto the skin daily. Remove & Discard patch within 12 hours or as directed by MD   melatonin 3 MG Tabs tablet Take 3 mg by mouth at bedtime.   methocarbamol 500 MG tablet Commonly known as: ROBAXIN Take 500 mg by mouth every 8 (eight) hours as needed for muscle spasms.   methocarbamol 500 MG tablet Commonly known as: ROBAXIN Take 1 tablet (500 mg total) by mouth every 8 (eight) hours as needed for muscle spasms.   mirtazapine 7.5 MG tablet Commonly known as: REMERON Take 7.5 mg by mouth at bedtime.   nicotine polacrilex 4 MG lozenge Commonly known  as: COMMIT Take 4 mg by mouth as needed. DISSOLVE 1 LOZENGE BY MOUTH AS DIRECTED FOR SMOKING CESSATION USE AT LEAST 8-10 LOZENGES EACH DAY TO START. DO NOT USE MORE THAN 20 LOZENGES IN A DAY. DO NOT EAT OR DRINK FOR 15 MINUTES BEFORE AND DURING USE   oxyCODONE-acetaminophen 5-325 MG tablet Commonly known as: PERCOCET/ROXICET Take 1-2 tablets by mouth every 6 (six) hours as needed for moderate pain or severe pain.   pantoprazole 20 MG tablet Commonly known as: PROTONIX Take 20 mg by mouth daily. What changed: Another medication with the same name was removed. Continue taking this medication, and follow the directions you see here.   predniSONE 20 MG tablet Commonly known as: DELTASONE Take 2 tablets (40 mg total) by mouth daily with breakfast for 3 days, THEN 1 tablet (20 mg total) daily with breakfast for 3 days, THEN 0.5 tablets (10 mg total) daily with breakfast for 3 days. Start taking on: March 03, 2023   sacubitril-valsartan 49-51 MG Commonly known as: ENTRESTO Take 1 tablet by  mouth 2 (two) times daily.   sennosides-docusate sodium 8.6-50 MG tablet Commonly known as: SENOKOT-S Take 1-2 tablets by mouth at bedtime as needed for constipation.   sildenafil 100 MG tablet Commonly known as: VIAGRA Take 100 mg by mouth daily as needed for erectile dysfunction.   tamsulosin 0.4 MG Caps capsule Commonly known as: FLOMAX Take 0.4 mg by mouth at bedtime. What changed: Another medication with the same name was removed. Continue taking this medication, and follow the directions you see here.   thiamine 100 MG tablet Commonly known as: VITAMIN B1 Take 100 mg by mouth daily.   tiotropium 18 MCG inhalation capsule Commonly known as: SPIRIVA Place 18 mcg into inhaler and inhale daily. What changed: Another medication with the same name was removed. Continue taking this medication, and follow the directions you see here.        Discharge Exam: Filed Weights   02/28/23 1726  Weight: 56.7 kg   GENERAL: Elderly male laying in bed in no acute distress EYES: Pupils equal, round, LUNGS: Chest clear to auscultation bilaterally CARDIOVASCULAR: Regular rate and rhythm, S1, S2 normal.  ABDOMEN: Soft, nondistended, nontender. Bowel sounds present.  EXTREMITIES: No pedal edema, cyanosis, or clubbing.  NEUROLOGIC: Cranial nerves II through XII are intact.  PSYCHIATRIC: The patient is alert and oriented x 3.  Normal affect and good eye contact. SKIN: No obvious rash, lesion, or ulcer.   Condition at discharge: good  The results of significant diagnostics from this hospitalization (including imaging, microbiology, ancillary and laboratory) are listed below for reference.   Imaging Studies: DG Chest 2 View Result Date: 02/28/2023 CLINICAL DATA:  Shortness of breath EXAM: CHEST - 2 VIEW COMPARISON:  X-ray 01/26/2023. FINDINGS: Hyperinflation. No consolidation, pneumothorax or effusion. No edema. Normal cardiopericardial silhouette. Calcified aorta. IMPRESSION:  Hyperinflation.  No acute cardiopulmonary disease. Electronically Signed   By: Karen Kays M.D.   On: 02/28/2023 18:16    Microbiology: Results for orders placed or performed during the hospital encounter of 02/28/23  Resp panel by RT-PCR (RSV, Flu A&B, Covid) Anterior Nasal Swab     Status: Abnormal   Collection Time: 02/28/23  8:00 PM   Specimen: Anterior Nasal Swab  Result Value Ref Range Status   SARS Coronavirus 2 by RT PCR POSITIVE (A) NEGATIVE Final    Comment: (NOTE) SARS-CoV-2 target nucleic acids are DETECTED.  The SARS-CoV-2 RNA is generally detectable in upper respiratory specimens  during the acute phase of infection. Positive results are indicative of the presence of the identified virus, but do not rule out bacterial infection or co-infection with other pathogens not detected by the test. Clinical correlation with patient history and other diagnostic information is necessary to determine patient infection status. The expected result is Negative.  Fact Sheet for Patients: BloggerCourse.com  Fact Sheet for Healthcare Providers: SeriousBroker.it  This test is not yet approved or cleared by the [place] FDA and  has been authorized for detection and/or diagnosis of SARS-CoV-2 by FDA under an Emergency Use Authorization (EUA).  This EUA will remain in effect (meaning this test can be used) for the duration of  the COVID-19 declaration under Section 564(b)(1) of the A ct, 21 U.S.C. section 360bbb-3(b)(1), unless the authorization is terminated or revoked sooner.     Influenza A by PCR NEGATIVE NEGATIVE Final   Influenza B by PCR NEGATIVE NEGATIVE Final    Comment: (NOTE) The Xpert Xpress SARS-CoV-2/FLU/RSV plus assay is intended as an aid in the diagnosis of influenza from Nasopharyngeal swab specimens and should not be used as a sole basis for treatment. Nasal washings and aspirates are unacceptable for Xpert  Xpress SARS-CoV-2/FLU/RSV testing.  Fact Sheet for Patients: BloggerCourse.com  Fact Sheet for Healthcare Providers: SeriousBroker.it  This test is not yet approved or cleared by the [place] FDA and has been authorized for detection and/or diagnosis of SARS-CoV-2 by FDA under an Emergency Use Authorization (EUA). This EUA will remain in effect (meaning this test can be used) for the duration of the COVID-19 declaration under Section 564(b)(1) of the Act, 21 U.S.C. section 360bbb-3(b)(1), unless the authorization is terminated or revoked.     Resp Syncytial Virus by PCR NEGATIVE NEGATIVE Final    Comment: (NOTE) Fact Sheet for Patients: BloggerCourse.com  Fact Sheet for Healthcare Providers: SeriousBroker.it  This test is not yet approved or cleared by the [place] FDA and has been authorized for detection and/or diagnosis of SARS-CoV-2 by FDA under an Emergency Use Authorization (EUA). This EUA will remain in effect (meaning this test can be used) for the duration of the COVID-19 declaration under Section 564(b)(1) of the Act, 21 U.S.C. section 360bbb-3(b)(1), unless the authorization is terminated or revoked.  Performed at [facility], [address] [place], [place] [address]     Labs: CBC: Recent Labs  Lab 02/28/23 1732 03/01/23 0456 03/02/23 0555  WBC 4.9 1.7* 9.6  NEUTROABS  --   --  8.1*  HGB 12.5* 11.4* 12.5*  HCT 40.3 35.2* 39.7  MCV 91.0 88.0 88.6  PLT 217 212 243   Basic Metabolic Panel: Recent Labs  Lab 02/28/23 1732 03/01/23 0456 03/02/23 0555  NA 142 136 140  K 4.3 4.4 4.2  CL 102 100 104  CO2 26 25 27   GLUCOSE 100* 348* 136*  BUN 12 18 20   CREATININE 1.24 1.16 0.93  CALCIUM 8.9 8.7* 9.1   Liver Function Tests: No results for input(s): "AST", "ALT", "ALKPHOS", "BILITOT", "PROT", "ALBUMIN" in the last 168  hours. CBG: Recent Labs  Lab 03/01/23 1650 03/01/23 2119 03/02/23 0720 03/02/23 1216  GLUCAP 130* 130* 101* 111*    Discharge time spent:  35 minutes.  Signed: Loyce Dys, MD [facility] 03/02/2023

## 2023-03-02 NOTE — Progress Notes (Signed)
   03/02/23 1050  Readmission Prevention Plan - Extreme Risk  Transportation Screening Complete  Medication Review Complete  PCP or Specialist appointment within 3-5 days of discharge Complete (Nurse secretary will schedule at time of d/c)  Social Work consult for recovery care planning/counseling (includes patient and caregiver) Complete  Palliative Care Screening Not Applicable  Skilled Nursing Facility Not Applicable   HRRA complete.

## 2023-03-02 NOTE — ED Notes (Signed)
Pharmacy contacted x2 for inhaler.

## 2023-03-02 NOTE — ED Notes (Signed)
Pt refuses insulin at this time.  Requesting replacement meal tray d/t food being cold, dietary contacted

## 2023-03-02 NOTE — ED Notes (Signed)
Pt given lunch tray.

## 2023-03-02 NOTE — ED Notes (Signed)
Pt reports he is leaving.  Iv d'ced.  Pt request meds to be sent to [facility] on [address].

## 2023-03-02 NOTE — ED Notes (Signed)
Pt provided d/c instructions, alert and oriented, NAD noted. Pt on phone appealing discharge at the time of this RN interaction.

## 2023-03-02 NOTE — ED Notes (Addendum)
Dr Meriam Sprague informed this RN of pt to be discharged. Pt reports he is appealing it d/t feeling like not ready to go, Dr Meriam Sprague aware, states will still discharge.  RN attempted to assist pt in finding ride or providing bus pass. Pt states ride does not get off work until [employer], states "I'm not taking a bus", states cannot pay for cab  Discussed situation with Charge RN Morrie Sheldon, pt to be discharged to lobby until ride arrives d/t refusing RN's efforts to help.

## 2023-03-02 NOTE — TOC Progression Note (Signed)
Transition of Care (TOC) - Progression Note    Patient Details  Name: Christopher Bryan. MRN: [id number] Date of Birth: March 21, 1960  Transition of Care [facility]) CM/SW Contact  Tory Emerald, [place] Phone Number: 03/02/2023, 1:51 PM  Clinical Narrative:     TOC completed documents for pt's appeal regarding the DND and HINN and presented to pt at bedside and explained documents; pt refused to sign. CSW documented on HINN. Pt stated he was going to finish his food and leave and then contact his attorney. CSW updated RN.        Expected Discharge Plan and Services         Expected Discharge Date: 03/02/23                                     Social Determinants of Health (SDOH) Interventions SDOH Screenings   Food Insecurity: No Food Insecurity (03/02/2023)  Housing: Low Risk  (03/02/2023)  Transportation Needs: No Transportation Needs (03/02/2023)  Utilities: Not At Risk (03/02/2023)  Social Connections: Unknown (03/02/2023)  Tobacco Use: High Risk (03/02/2023)    Readmission Risk Interventions    03/02/2023   10:50 AM  Readmission Risk Prevention Plan  Transportation Screening Complete  Medication Review (RN Care Manager) Complete  PCP or Specialist appointment within 3-5 days of discharge Complete  SW Recovery Care/Counseling Consult Complete  Palliative Care Screening Not Applicable  Skilled Nursing Facility Not Applicable

## 2023-03-02 NOTE — ED Provider Notes (Deleted)
Emergency Medicine Observation Re-evaluation Note  Christopher Bryan. is a 63 y.o. male, seen on rounds today.  Pt initially presented to the ED for complaints of Shortness of Breath  Currently, the patient is calm, no acute complaints.  Physical Exam  Blood pressure (!) 141/87, pulse (!) 101, temperature 98 F (36.7 C), resp. rate 19, height 5\' 7"  (1.702 m), weight 56.7 kg, SpO2 96%. Physical Exam General: NAD Lungs: CTAB Psych: not agitated  ED Course / MDM  EKG:    I have reviewed the labs performed to date as well as medications administered while in observation.  Recent changes in the last 24 hours include no acute events overnight.    Plan  Current plan is for TOC placement.   Sharman Cheek, MD 03/02/23 [phone]

## 2023-03-02 NOTE — ED Notes (Signed)
Resumed care from bri rn.  Pt on cell phone.  Meds given.  Sinus tach on monitor.  Skin warm and dry.  Pt reports he believes he is going on home and wants to be discharged.  Pt on phone for a car ride.

## 2023-03-02 NOTE — ED Notes (Signed)
Pt given discharge papers. Pt states he is not leaving, states was told by appeals not to leave. Burney Gauze RN aware, Retail banker notified of situation.

## 2023-03-02 NOTE — ED Notes (Signed)
This RN entered patient's room, asked if VS can be obtained. Pt visualized ambulating around room and to be recording, educated pt that recording is not permitted in ED. Pt stated that he was not recording this RN's face, just voice and things around the room. Raquel, AD notified of situation and to room to discuss concerns with patient.  Pt verbalizes understanding of recording not being permitted and that this RN will continue to provide care to patient while appeal is processing.

## 2023-03-02 NOTE — ED Notes (Addendum)
This RN asked pt if I can check his cbg before started eating lunch. Pt states "you can if you want to because I submitted my appeal and I'm not going anywhere".  RN informed pt that aware of appeal and not asking pt to go anywhere.

## 2023-03-02 NOTE — ED Notes (Signed)
Pt left willingly on his on after iv was taken out by this rn.  l

## 2023-03-02 NOTE — ED Notes (Signed)
This RN attempted IV access x2, unsuccessful. 2nd RN to attempt

## 2023-03-27 ENCOUNTER — Inpatient Hospital Stay
Admission: EM | Admit: 2023-03-27 | Discharge: 2023-03-30 | DRG: 291 | Disposition: A | Discharge status: Home / Self Care | Payer: No Typology Code available for payment source | Attending: Internal Medicine | Admitting: Internal Medicine

## 2023-03-27 DIAGNOSIS — I428 Other cardiomyopathies: Secondary | ICD-10-CM | POA: Diagnosis present

## 2023-03-27 DIAGNOSIS — Z8674 Personal history of sudden cardiac arrest: Secondary | ICD-10-CM

## 2023-03-27 DIAGNOSIS — J9620 Acute and chronic respiratory failure, unspecified whether with hypoxia or hypercapnia: Secondary | ICD-10-CM | POA: Diagnosis present

## 2023-03-27 DIAGNOSIS — R739 Hyperglycemia, unspecified: Secondary | ICD-10-CM | POA: Diagnosis present

## 2023-03-27 DIAGNOSIS — Z1152 Encounter for screening for COVID-19: Secondary | ICD-10-CM

## 2023-03-27 DIAGNOSIS — Z86718 Personal history of other venous thrombosis and embolism: Secondary | ICD-10-CM

## 2023-03-27 DIAGNOSIS — Z7901 Long term (current) use of anticoagulants: Secondary | ICD-10-CM

## 2023-03-27 DIAGNOSIS — I5082 Biventricular heart failure: Secondary | ICD-10-CM | POA: Diagnosis present

## 2023-03-27 DIAGNOSIS — J9601 Acute respiratory failure with hypoxia: Secondary | ICD-10-CM

## 2023-03-27 DIAGNOSIS — Z91041 Radiographic dye allergy status: Secondary | ICD-10-CM

## 2023-03-27 DIAGNOSIS — Z7982 Long term (current) use of aspirin: Secondary | ICD-10-CM

## 2023-03-27 DIAGNOSIS — J9602 Acute respiratory failure with hypercapnia: Secondary | ICD-10-CM | POA: Diagnosis present

## 2023-03-27 DIAGNOSIS — S41101S Unspecified open wound of right upper arm, sequela: Secondary | ICD-10-CM

## 2023-03-27 DIAGNOSIS — Z79899 Other long term (current) drug therapy: Secondary | ICD-10-CM

## 2023-03-27 DIAGNOSIS — Z8249 Family history of ischemic heart disease and other diseases of the circulatory system: Secondary | ICD-10-CM

## 2023-03-27 DIAGNOSIS — J441 Chronic obstructive pulmonary disease with (acute) exacerbation: Secondary | ICD-10-CM

## 2023-03-27 DIAGNOSIS — Z86711 Personal history of pulmonary embolism: Secondary | ICD-10-CM

## 2023-03-27 DIAGNOSIS — R7989 Other specified abnormal findings of blood chemistry: Secondary | ICD-10-CM

## 2023-03-27 DIAGNOSIS — J189 Pneumonia, unspecified organism: Secondary | ICD-10-CM

## 2023-03-27 DIAGNOSIS — F1410 Cocaine abuse, uncomplicated: Secondary | ICD-10-CM | POA: Diagnosis present

## 2023-03-27 DIAGNOSIS — Z825 Family history of asthma and other chronic lower respiratory diseases: Secondary | ICD-10-CM

## 2023-03-27 DIAGNOSIS — I4891 Unspecified atrial fibrillation: Secondary | ICD-10-CM | POA: Diagnosis present

## 2023-03-27 DIAGNOSIS — I110 Hypertensive heart disease with heart failure: Principal | ICD-10-CM | POA: Diagnosis present

## 2023-03-27 DIAGNOSIS — Z91040 Latex allergy status: Secondary | ICD-10-CM

## 2023-03-27 DIAGNOSIS — Z7902 Long term (current) use of antithrombotics/antiplatelets: Secondary | ICD-10-CM

## 2023-03-27 DIAGNOSIS — I5023 Acute on chronic systolic (congestive) heart failure: Secondary | ICD-10-CM | POA: Diagnosis present

## 2023-03-27 DIAGNOSIS — I739 Peripheral vascular disease, unspecified: Secondary | ICD-10-CM | POA: Diagnosis present

## 2023-03-27 DIAGNOSIS — F17210 Nicotine dependence, cigarettes, uncomplicated: Secondary | ICD-10-CM | POA: Diagnosis present

## 2023-03-27 DIAGNOSIS — I10 Essential (primary) hypertension: Secondary | ICD-10-CM | POA: Diagnosis present

## 2023-03-27 DIAGNOSIS — J9600 Acute respiratory failure, unspecified whether with hypoxia or hypercapnia: Secondary | ICD-10-CM | POA: Diagnosis not present

## 2023-03-27 DIAGNOSIS — Z91014 Allergy to mammalian meats: Secondary | ICD-10-CM

## 2023-03-27 DIAGNOSIS — J440 Chronic obstructive pulmonary disease with acute lower respiratory infection: Secondary | ICD-10-CM | POA: Diagnosis present

## 2023-03-27 DIAGNOSIS — I161 Hypertensive emergency: Secondary | ICD-10-CM

## 2023-03-27 DIAGNOSIS — E8729 Other acidosis: Secondary | ICD-10-CM | POA: Diagnosis present

## 2023-03-27 DIAGNOSIS — T380X5A Adverse effect of glucocorticoids and synthetic analogues, initial encounter: Secondary | ICD-10-CM | POA: Diagnosis present

## 2023-03-27 DIAGNOSIS — R7401 Elevation of levels of liver transaminase levels: Secondary | ICD-10-CM | POA: Diagnosis present

## 2023-03-27 DIAGNOSIS — I96 Gangrene, not elsewhere classified: Secondary | ICD-10-CM | POA: Diagnosis present

## 2023-03-27 DIAGNOSIS — I509 Heart failure, unspecified: Principal | ICD-10-CM

## 2023-03-27 HISTORY — DX: Right heart failure, unspecified: I50.810

## 2023-03-27 HISTORY — DX: Unspecified atrial fibrillation: I48.91

## 2023-03-27 HISTORY — DX: Nicotine dependence, unspecified, uncomplicated: F17.200

## 2023-03-27 HISTORY — DX: Heart failure, unspecified: I50.9

## 2023-03-27 NOTE — ED Triage Notes (Signed)
 Pt arrived from home BIB [facility] for resp distress, EMS reports pt was tripoding, sats 78% RA, wheezing throughout, pt has hx of COPD, smoker, and right sided heart failure. EMS placed pt on CPAP, pt arrived alert, mask removed on arrival as pt not handling it well, RT Cory placed pt on 6L [place].  Tachypenic on arrival and purse lips breathing.   EMS vitals 223/125 116 2 gram mag 125 solu medrol 2 Duo Nebs  2 inch nitroglycerin paste (left chest)

## 2023-03-27 NOTE — ED Triage Notes (Incomplete)
 Pt arrived from home BIB [facility] for resp distress, EMS reports pt was tripoding, sats 78% RA, pt has hx of COPD, smoker, and right sided heart failure. EMS placed pt on CPAP, pt arrived alert, mask removed on arrival as pt not handling it well, RT Cory placed pt on 6L [place].     EMS vitals 223/125 116 2 gram mag 125 solu medrol 2 DuO Nebs  2 inch nitroglycerin paste

## 2023-03-28 ENCOUNTER — Other Ambulatory Visit: Payer: Self-pay

## 2023-03-28 ENCOUNTER — Emergency Department: Payer: 59

## 2023-03-28 DIAGNOSIS — I428 Other cardiomyopathies: Secondary | ICD-10-CM | POA: Diagnosis present

## 2023-03-28 DIAGNOSIS — Z86718 Personal history of other venous thrombosis and embolism: Secondary | ICD-10-CM | POA: Diagnosis not present

## 2023-03-28 DIAGNOSIS — I96 Gangrene, not elsewhere classified: Secondary | ICD-10-CM | POA: Diagnosis present

## 2023-03-28 DIAGNOSIS — Z7901 Long term (current) use of anticoagulants: Secondary | ICD-10-CM | POA: Diagnosis not present

## 2023-03-28 DIAGNOSIS — Z7982 Long term (current) use of aspirin: Secondary | ICD-10-CM | POA: Diagnosis not present

## 2023-03-28 DIAGNOSIS — Z8249 Family history of ischemic heart disease and other diseases of the circulatory system: Secondary | ICD-10-CM | POA: Diagnosis not present

## 2023-03-28 DIAGNOSIS — I5023 Acute on chronic systolic (congestive) heart failure: Secondary | ICD-10-CM | POA: Diagnosis present

## 2023-03-28 DIAGNOSIS — I5082 Biventricular heart failure: Secondary | ICD-10-CM | POA: Diagnosis present

## 2023-03-28 DIAGNOSIS — J9600 Acute respiratory failure, unspecified whether with hypoxia or hypercapnia: Secondary | ICD-10-CM | POA: Diagnosis present

## 2023-03-28 DIAGNOSIS — I161 Hypertensive emergency: Secondary | ICD-10-CM | POA: Diagnosis present

## 2023-03-28 DIAGNOSIS — I160 Hypertensive urgency: Secondary | ICD-10-CM

## 2023-03-28 DIAGNOSIS — I509 Heart failure, unspecified: Secondary | ICD-10-CM

## 2023-03-28 DIAGNOSIS — I4891 Unspecified atrial fibrillation: Secondary | ICD-10-CM | POA: Diagnosis present

## 2023-03-28 DIAGNOSIS — F1410 Cocaine abuse, uncomplicated: Secondary | ICD-10-CM | POA: Diagnosis present

## 2023-03-28 DIAGNOSIS — J441 Chronic obstructive pulmonary disease with (acute) exacerbation: Secondary | ICD-10-CM

## 2023-03-28 DIAGNOSIS — I110 Hypertensive heart disease with heart failure: Secondary | ICD-10-CM | POA: Diagnosis present

## 2023-03-28 DIAGNOSIS — R739 Hyperglycemia, unspecified: Secondary | ICD-10-CM | POA: Diagnosis present

## 2023-03-28 DIAGNOSIS — R7401 Elevation of levels of liver transaminase levels: Secondary | ICD-10-CM | POA: Diagnosis present

## 2023-03-28 DIAGNOSIS — Z1152 Encounter for screening for COVID-19: Secondary | ICD-10-CM | POA: Diagnosis not present

## 2023-03-28 DIAGNOSIS — E8729 Other acidosis: Secondary | ICD-10-CM | POA: Diagnosis present

## 2023-03-28 DIAGNOSIS — Z7902 Long term (current) use of antithrombotics/antiplatelets: Secondary | ICD-10-CM | POA: Diagnosis not present

## 2023-03-28 DIAGNOSIS — S41101S Unspecified open wound of right upper arm, sequela: Secondary | ICD-10-CM | POA: Diagnosis not present

## 2023-03-28 DIAGNOSIS — J9602 Acute respiratory failure with hypercapnia: Secondary | ICD-10-CM | POA: Diagnosis present

## 2023-03-28 DIAGNOSIS — J189 Pneumonia, unspecified organism: Secondary | ICD-10-CM | POA: Diagnosis present

## 2023-03-28 DIAGNOSIS — J9601 Acute respiratory failure with hypoxia: Secondary | ICD-10-CM

## 2023-03-28 DIAGNOSIS — F17210 Nicotine dependence, cigarettes, uncomplicated: Secondary | ICD-10-CM | POA: Diagnosis present

## 2023-03-28 DIAGNOSIS — T380X5A Adverse effect of glucocorticoids and synthetic analogues, initial encounter: Secondary | ICD-10-CM | POA: Diagnosis present

## 2023-03-28 DIAGNOSIS — J440 Chronic obstructive pulmonary disease with acute lower respiratory infection: Secondary | ICD-10-CM | POA: Diagnosis present

## 2023-03-28 DIAGNOSIS — J9620 Acute and chronic respiratory failure, unspecified whether with hypoxia or hypercapnia: Secondary | ICD-10-CM | POA: Diagnosis present

## 2023-03-28 LAB — CBC WITH DIFFERENTIAL/PLATELET
Abs Immature Granulocytes: 0.01 10*3/uL (ref 0.00–0.07)
Basophils Absolute: 0 10*3/uL (ref 0.0–0.1)
Basophils Relative: 0 %
Eosinophils Absolute: 0.2 10*3/uL (ref 0.0–0.5)
Eosinophils Relative: 2 %
HCT: 41.6 % (ref 39.0–52.0)
Hemoglobin: 12.9 g/dL — ABNORMAL LOW (ref 13.0–17.0)
Immature Granulocytes: 0 %
Lymphocytes Relative: 81 %
Lymphs Abs: 7.9 10*3/uL — ABNORMAL HIGH (ref 0.7–4.0)
MCH: 28.4 pg (ref 26.0–34.0)
MCHC: 31 g/dL (ref 30.0–36.0)
MCV: 91.6 fL (ref 80.0–100.0)
Monocytes Absolute: 0.5 10*3/uL (ref 0.1–1.0)
Monocytes Relative: 6 %
Neutro Abs: 1.1 10*3/uL — ABNORMAL LOW (ref 1.7–7.7)
Neutrophils Relative %: 11 %
Platelets: 205 10*3/uL (ref 150–400)
RBC: 4.54 MIL/uL (ref 4.22–5.81)
RDW: 13.6 % (ref 11.5–15.5)
Smear Review: NORMAL
WBC: 9.7 10*3/uL (ref 4.0–10.5)
nRBC: 0 % (ref 0.0–0.2)

## 2023-03-28 LAB — PROCALCITONIN: Procalcitonin: <0.1 ng/mL

## 2023-03-28 LAB — BRAIN NATRIURETIC PEPTIDE: B Natriuretic Peptide: 507.8 pg/mL — ABNORMAL HIGH (ref 0.0–100.0)

## 2023-03-28 LAB — COMPREHENSIVE METABOLIC PANEL
ALT: 37 U/L (ref 0–44)
AST: 75 U/L — ABNORMAL HIGH (ref 15–41)
Albumin: 4.1 g/dL (ref 3.5–5.0)
Alkaline Phosphatase: 96 U/L (ref 38–126)
Anion gap: 14 (ref 5–15)
BUN: 21 mg/dL (ref 8–23)
CO2: 25 mmol/L (ref 22–32)
Calcium: 9 mg/dL (ref 8.9–10.3)
Chloride: 105 mmol/L (ref 98–111)
Creatinine, Ser: 1.18 mg/dL (ref 0.61–1.24)
GFR, Estimated: >60 mL/min (ref >60)
Glucose, Bld: 182 mg/dL — ABNORMAL HIGH (ref 70–99)
Potassium: 3.9 mmol/L (ref 3.5–5.1)
Sodium: 144 mmol/L (ref 135–145)
Total Bilirubin: 0.5 mg/dL (ref 0.0–1.2)
Total Protein: 7.2 g/dL (ref 6.5–8.1)

## 2023-03-28 LAB — CBG MONITORING, ED: Glucose-Capillary: 105 mg/dL — ABNORMAL HIGH (ref 70–99)

## 2023-03-28 LAB — HEPATIC FUNCTION PANEL
ALT: 65 U/L — ABNORMAL HIGH (ref 0–44)
AST: 122 U/L — ABNORMAL HIGH (ref 15–41)
Albumin: 3.7 g/dL (ref 3.5–5.0)
Alkaline Phosphatase: 103 U/L (ref 38–126)
Bilirubin, Direct: <0.1 mg/dL (ref 0.0–0.2)
Total Bilirubin: 0.4 mg/dL (ref 0.0–1.2)
Total Protein: 7.3 g/dL (ref 6.5–8.1)

## 2023-03-28 LAB — URINE DRUG SCREEN, QUALITATIVE (ARMC ONLY)
Amphetamines, Ur Screen: NOT DETECTED
Barbiturates, Ur Screen: NOT DETECTED
Benzodiazepine, Ur Scrn: NOT DETECTED
Cannabinoid 50 Ng, Ur ~~LOC~~: NOT DETECTED
Cocaine Metabolite,Ur ~~LOC~~: POSITIVE — AB
MDMA (Ecstasy)Ur Screen: NOT DETECTED
Methadone Scn, Ur: NOT DETECTED
Opiate, Ur Screen: NOT DETECTED
Phencyclidine (PCP) Ur S: NOT DETECTED
Tricyclic, Ur Screen: NOT DETECTED

## 2023-03-28 LAB — BLOOD GAS, VENOUS
Acid-Base Excess: 1 mmol/L (ref 0.0–2.0)
Acid-base deficit: 1.3 mmol/L (ref 0.0–2.0)
Bicarbonate: 29.5 mmol/L — ABNORMAL HIGH (ref 20.0–28.0)
Bicarbonate: 27.9 mmol/L (ref 20.0–28.0)
Delivery systems: POSITIVE
O2 Saturation: 15.9 %
O2 Saturation: 60.6 %
Patient temperature: 37
Patient temperature: 37
pCO2, Ven: 81 mmHg (ref 44–60)
pCO2, Ven: 53 mmHg (ref 44–60)
pH, Ven: 7.17 — CL (ref 7.25–7.43)
pH, Ven: 7.33 (ref 7.25–7.43)
pO2, Ven: <31 mmHg — CL (ref 32–45)
pO2, Ven: 36 mmHg (ref 32–45)

## 2023-03-28 LAB — CBC
HCT: 38.4 % — ABNORMAL LOW (ref 39.0–52.0)
Hemoglobin: 12.2 g/dL — ABNORMAL LOW (ref 13.0–17.0)
MCH: 28.2 pg (ref 26.0–34.0)
MCHC: 31.8 g/dL (ref 30.0–36.0)
MCV: 88.9 fL (ref 80.0–100.0)
Platelets: 182 10*3/uL (ref 150–400)
RBC: 4.32 MIL/uL (ref 4.22–5.81)
RDW: 13.5 % (ref 11.5–15.5)
WBC: 7.2 10*3/uL (ref 4.0–10.5)
nRBC: 0 % (ref 0.0–0.2)

## 2023-03-28 LAB — LACTIC ACID, PLASMA: Lactic Acid, Venous: 1.4 mmol/L (ref 0.5–1.9)

## 2023-03-28 LAB — RESP PANEL BY RT-PCR (RSV, FLU A&B, COVID)  RVPGX2
Influenza A by PCR: NEGATIVE
Influenza B by PCR: NEGATIVE
Resp Syncytial Virus by PCR: NEGATIVE
SARS Coronavirus 2 by RT PCR: NEGATIVE

## 2023-03-28 LAB — PHOSPHORUS: Phosphorus: 4.6 mg/dL (ref 2.5–4.6)

## 2023-03-28 LAB — BASIC METABOLIC PANEL
Anion gap: 9 (ref 5–15)
BUN: 21 mg/dL (ref 8–23)
CO2: 27 mmol/L (ref 22–32)
Calcium: 8.5 mg/dL — ABNORMAL LOW (ref 8.9–10.3)
Chloride: 105 mmol/L (ref 98–111)
Creatinine, Ser: 1.05 mg/dL (ref 0.61–1.24)
GFR, Estimated: >60 mL/min (ref >60)
Glucose, Bld: 117 mg/dL — ABNORMAL HIGH (ref 70–99)
Potassium: 4.5 mmol/L (ref 3.5–5.1)
Sodium: 141 mmol/L (ref 135–145)

## 2023-03-28 LAB — MAGNESIUM: Magnesium: 2.3 mg/dL (ref 1.7–2.4)

## 2023-03-28 LAB — APTT: aPTT: 29 s (ref 24–36)

## 2023-03-28 LAB — PATHOLOGIST SMEAR REVIEW

## 2023-03-28 LAB — PROTIME-INR
INR: 1.1 (ref 0.8–1.2)
Prothrombin Time: 14.4 s (ref 11.4–15.2)

## 2023-03-28 LAB — GLUCOSE, CAPILLARY: Glucose-Capillary: 130 mg/dL — ABNORMAL HIGH (ref 70–99)

## 2023-03-28 LAB — TROPONIN I (HIGH SENSITIVITY)
Troponin I (High Sensitivity): 23 ng/L — ABNORMAL HIGH (ref <18)
Troponin I (High Sensitivity): 25 ng/L — ABNORMAL HIGH (ref <18)
Troponin I (High Sensitivity): 30 ng/L — ABNORMAL HIGH (ref <18)

## 2023-03-28 MED ORDER — MORPHINE SULFATE (PF) 2 MG/ML IV SOLN: 2.0000 mg | Freq: Once | INTRAVENOUS | Status: AC

## 2023-03-28 MED ORDER — SODIUM CHLORIDE 0.9 % IV SOLN
100.0000 mg | Freq: Once | INTRAVENOUS | Status: AC
  Administered 2023-03-28: 100 mg via INTRAVENOUS
  Filled 2023-03-28: qty 100

## 2023-03-28 MED ORDER — ATORVASTATIN CALCIUM 20 MG PO TABS
20.0000 mg | ORAL_TABLET | Freq: Every day | ORAL | Status: DC
  Administered 2023-03-29: 20 mg via ORAL
  Filled 2023-03-28: qty 1

## 2023-03-28 MED ORDER — GABAPENTIN 300 MG PO CAPS
600.0000 mg | ORAL_CAPSULE | Freq: Three times a day (TID) | ORAL | Status: DC
  Administered 2023-03-28 – 2023-03-30 (×5): 600 mg via ORAL
  Filled 2023-03-28 (×5): qty 2

## 2023-03-28 MED ORDER — DOCUSATE SODIUM 100 MG PO CAPS: 100.0000 mg | ORAL_CAPSULE | Freq: Two times a day (BID) | ORAL | Status: DC | PRN

## 2023-03-28 MED ORDER — MORPHINE SULFATE (PF) 2 MG/ML IV SOLN
INTRAVENOUS | Status: AC
  Administered 2023-03-28: 2 mg via INTRAVENOUS
  Filled 2023-03-28: qty 1

## 2023-03-28 MED ORDER — ENOXAPARIN SODIUM 60 MG/0.6ML IJ SOSY
1.0000 mg/kg | PREFILLED_SYRINGE | Freq: Two times a day (BID) | INTRAMUSCULAR | Status: DC
  Administered 2023-03-28: 57.5 mg via SUBCUTANEOUS
  Filled 2023-03-28: qty 0.6

## 2023-03-28 MED ORDER — LIDOCAINE 5 % EX PTCH: 1.0000 | MEDICATED_PATCH | Freq: Every day | CUTANEOUS | Status: DC | PRN

## 2023-03-28 MED ORDER — MORPHINE SULFATE (PF) 2 MG/ML IV SOLN
2.0000 mg | INTRAVENOUS | Status: DC | PRN
  Administered 2023-03-28 – 2023-03-30 (×6): 2 mg via INTRAVENOUS
  Filled 2023-03-28 (×6): qty 1

## 2023-03-28 MED ORDER — METHYLPREDNISOLONE SODIUM SUCC 40 MG IJ SOLR
40.0000 mg | Freq: Two times a day (BID) | INTRAMUSCULAR | Status: DC
  Administered 2023-03-28 – 2023-03-30 (×5): 40 mg via INTRAVENOUS
  Filled 2023-03-28 (×5): qty 1

## 2023-03-28 MED ORDER — PANTOPRAZOLE SODIUM 20 MG PO TBEC
20.0000 mg | DELAYED_RELEASE_TABLET | Freq: Every day | ORAL | Status: DC
  Administered 2023-03-29 – 2023-03-30 (×2): 20 mg via ORAL
  Filled 2023-03-28 (×2): qty 1

## 2023-03-28 MED ORDER — CLOPIDOGREL BISULFATE 75 MG PO TABS
75.0000 mg | ORAL_TABLET | Freq: Every day | ORAL | Status: DC
  Administered 2023-03-28 – 2023-03-30 (×3): 75 mg via ORAL
  Filled 2023-03-28 (×3): qty 1

## 2023-03-28 MED ORDER — THIAMINE HCL 100 MG/ML IJ SOLN
100.0000 mg | Freq: Every day | INTRAMUSCULAR | Status: DC
  Administered 2023-03-28 – 2023-03-30 (×3): 100 mg via INTRAVENOUS
  Filled 2023-03-28 (×3): qty 2

## 2023-03-28 MED ORDER — SODIUM CHLORIDE 0.9 % IV SOLN
1.0000 g | Freq: Once | INTRAVENOUS | Status: AC
  Administered 2023-03-28: 1 g via INTRAVENOUS
  Filled 2023-03-28: qty 10

## 2023-03-28 MED ORDER — SODIUM CHLORIDE 0.9 % IV SOLN
500.0000 mg | INTRAVENOUS | Status: DC
  Administered 2023-03-29: 500 mg via INTRAVENOUS
  Filled 2023-03-28: qty 5

## 2023-03-28 MED ORDER — SODIUM CHLORIDE 0.9 % IV SOLN
1.0000 g | INTRAVENOUS | Status: DC
  Administered 2023-03-29 – 2023-03-30 (×2): 1 g via INTRAVENOUS
  Filled 2023-03-28 (×2): qty 10

## 2023-03-28 MED ORDER — ENOXAPARIN SODIUM 60 MG/0.6ML IJ SOSY: 1.0000 mg/kg | PREFILLED_SYRINGE | Freq: Two times a day (BID) | INTRAMUSCULAR | Status: DC

## 2023-03-28 MED ORDER — HYDRALAZINE HCL 20 MG/ML IJ SOLN: 10.0000 mg | INTRAMUSCULAR | Status: DC | PRN

## 2023-03-28 MED ORDER — POLYETHYLENE GLYCOL 3350 17 G PO PACK: 17.0000 g | PACK | Freq: Every day | ORAL | Status: DC | PRN

## 2023-03-28 MED ORDER — CARVEDILOL 6.25 MG PO TABS
6.2500 mg | ORAL_TABLET | Freq: Two times a day (BID) | ORAL | Status: DC
  Administered 2023-03-28 – 2023-03-30 (×4): 6.25 mg via ORAL
  Filled 2023-03-28 (×4): qty 1

## 2023-03-28 MED ORDER — CARBOXYMETHYLCELLULOSE SODIUM 0.5 % OP SOLN: 1.0000 [drp] | Freq: Four times a day (QID) | OPHTHALMIC | Status: DC | PRN

## 2023-03-28 MED ORDER — IPRATROPIUM-ALBUTEROL 0.5-2.5 (3) MG/3ML IN SOLN
6.0000 mL | Freq: Once | RESPIRATORY_TRACT | Status: AC
  Administered 2023-03-28: 6 mL via RESPIRATORY_TRACT
  Filled 2023-03-28: qty 6

## 2023-03-28 MED ORDER — IPRATROPIUM-ALBUTEROL 0.5-2.5 (3) MG/3ML IN SOLN
3.0000 mL | Freq: Three times a day (TID) | RESPIRATORY_TRACT | Status: DC
  Administered 2023-03-29 – 2023-03-30 (×4): 3 mL via RESPIRATORY_TRACT
  Filled 2023-03-28 (×4): qty 3

## 2023-03-28 MED ORDER — FUROSEMIDE 10 MG/ML IJ SOLN
40.0000 mg | Freq: Once | INTRAMUSCULAR | Status: AC
  Administered 2023-03-28: 40 mg via INTRAVENOUS
  Filled 2023-03-28: qty 4

## 2023-03-28 MED ORDER — VITAMIN D 25 MCG (1000 UNIT) PO TABS
2000.0000 [IU] | ORAL_TABLET | Freq: Every day | ORAL | Status: DC
Start: 2023-03-29 — End: 2023-03-30
  Administered 2023-03-29 – 2023-03-30 (×2): 2000 [IU] via ORAL
  Filled 2023-03-28 (×2): qty 2

## 2023-03-28 MED ORDER — IPRATROPIUM-ALBUTEROL 0.5-2.5 (3) MG/3ML IN SOLN: 3.0000 mL | RESPIRATORY_TRACT | Status: DC | PRN

## 2023-03-28 MED ORDER — DICLOFENAC SODIUM 1 % EX GEL
2.0000 g | Freq: Three times a day (TID) | CUTANEOUS | Status: DC | PRN
Start: 2023-03-28 — End: 2023-03-30

## 2023-03-28 MED ORDER — ONDANSETRON HCL 4 MG/2ML IJ SOLN
INTRAMUSCULAR | Status: AC
  Administered 2023-03-28: 4 mg
  Filled 2023-03-28: qty 2

## 2023-03-28 MED ORDER — SODIUM CHLORIDE 0.9 % IV SOLN
1.0000 mg | Freq: Every day | INTRAVENOUS | Status: DC
  Administered 2023-03-28 – 2023-03-30 (×3): 1 mg via INTRAVENOUS
  Filled 2023-03-28 (×3): qty 0.2

## 2023-03-28 MED ORDER — POLYVINYL ALCOHOL 1.4 % OP SOLN: 1.0000 [drp] | OPHTHALMIC | Status: DC | PRN

## 2023-03-28 MED ORDER — METHOCARBAMOL 500 MG PO TABS
500.0000 mg | ORAL_TABLET | Freq: Three times a day (TID) | ORAL | Status: DC | PRN
  Administered 2023-03-28 – 2023-03-30 (×2): 500 mg via ORAL
  Filled 2023-03-28 (×2): qty 1

## 2023-03-28 MED ORDER — MELATONIN 5 MG PO TABS
5.0000 mg | ORAL_TABLET | Freq: Every day | ORAL | Status: DC
  Administered 2023-03-28 – 2023-03-29 (×2): 5 mg via ORAL
  Filled 2023-03-28 (×2): qty 1

## 2023-03-28 MED ORDER — APIXABAN 5 MG PO TABS
5.0000 mg | ORAL_TABLET | Freq: Two times a day (BID) | ORAL | Status: DC
  Administered 2023-03-28 – 2023-03-30 (×4): 5 mg via ORAL
  Filled 2023-03-28 (×4): qty 1

## 2023-03-28 MED ORDER — SENNOSIDES-DOCUSATE SODIUM 8.6-50 MG PO TABS: 1.0000 | ORAL_TABLET | Freq: Every evening | ORAL | Status: DC | PRN

## 2023-03-28 MED ORDER — BUDESONIDE 0.25 MG/2ML IN SUSP
0.2500 mg | Freq: Two times a day (BID) | RESPIRATORY_TRACT | Status: DC
  Administered 2023-03-28 – 2023-03-30 (×5): 0.25 mg via RESPIRATORY_TRACT
  Filled 2023-03-28 (×6): qty 2

## 2023-03-28 MED ORDER — IPRATROPIUM-ALBUTEROL 0.5-2.5 (3) MG/3ML IN SOLN
3.0000 mL | RESPIRATORY_TRACT | Status: DC
  Administered 2023-03-28 (×5): 3 mL via RESPIRATORY_TRACT
  Filled 2023-03-28 (×4): qty 3

## 2023-03-28 MED ORDER — NITROGLYCERIN IN D5W 200-5 MCG/ML-% IV SOLN
0.0000 ug/min | INTRAVENOUS | Status: DC
  Administered 2023-03-28: 100 ug/min via INTRAVENOUS
  Filled 2023-03-28: qty 250

## 2023-03-28 NOTE — Progress Notes (Signed)
 Heart Failure Stewardship Pharmacy Note  PCP: [facility] PCP-Cardiologist: Julien Nordmann, MD  HPI: Christopher Bryan. is a 63 y.o. male with COPD, hypertension and peripheral vascular disease, history of DVT, cardiac arrest 01/2023, CHF, cocaine use who presented with worsening dyspnea and productive cough over several days. BP on admission was severely elevated to 198/108 mmHg. In 01/2023, experienced a witnessed PEA cardiac arrest in the ED after 1 round of CPR converting to wide complex tachycardia appearing metabolic in nature and ROSC was achieved after calcium gluconate and bicarbonate. Pulses were lost again with ROSC after repeat administration of epinephrine, bicarbonate, and calcium. Recent hospitalization for COVID.  On admission, BNP was 507.8, HS-troponin was 23 > 25, lactic acid of 1.4 and positive for cocaine. Chest x-ray noted minimal.strandy and patchy opacities in the left lung base, atelectasis versus infiltrate with possible small pleural effusions.  Pertinent cardiac history: Echo in 05/2016 showed LVEF of 55-60%, mild concentric hypertrophy.  Echo in 12/2017 showed LVEF of 65-70%. Cardiac arrest in 01/2023. Echo 01/2023 showed LVEF reduced to 35-40%, normal RV, mild-moderate AR. Given elevated troponin and cardiac arrest, patient underwent cardiac cath showing mild, nonobstructive CAD with normal CO/CI. Cardiomyopathy likely secondary to cocaine use.   Pertinent Lab Values: Creatinine, Ser  Date Value Ref Range Status  03/28/2023 1.05 0.61 - 1.24 mg/dL Final   BUN  Date Value Ref Range Status  03/28/2023 21 8 - 23 mg/dL Final   Potassium  Date Value Ref Range Status  03/28/2023 4.5 3.5 - 5.1 mmol/L Final   Sodium  Date Value Ref Range Status  03/28/2023 141 135 - 145 mmol/L Final   B Natriuretic Peptide  Date Value Ref Range Status  03/28/2023 507.8 (H) 0.0 - 100.0 pg/mL Final    Comment:    Performed at [facility], [address] [place], [place] [address]   Magnesium  Date Value Ref Range Status  03/28/2023 2.3 1.7 - 2.4 mg/dL Final    Comment:    Performed at [facility], [address] [place], [place] [address]   Hgb A1c MFr Bld  Date Value Ref Range Status  03/01/2023 5.6 4.8 - 5.6 % Final    Comment:    (NOTE) Pre diabetes:          5.7%-6.4%  Diabetes:              >6.4%  Glycemic control for   <7.0% adults with diabetes    TSH  Date Value Ref Range Status  06/21/2016 0.316 (L) 0.350 - 4.500 uIU/mL Final    Comment:    Performed by a 3rd Generation assay with a functional sensitivity of <=0.01 uIU/mL.    Vital Signs: Temp:  [97.2 F (36.2 C)-97.4 F (36.3 C)] 97.2 F (36.2 C) (02/25 0507) Pulse Rate:  [68-115] 68 (02/25 0600) Cardiac Rhythm: Normal sinus rhythm (02/25 0235) Resp:  [13-34] 14 (02/25 0600) BP: (96-221)/(62-116) 107/69 (02/25 0600) SpO2:  [90 %-100 %] 99 % (02/25 0600) FiO2 (%):  [30 %-60 %] 30 % (02/25 0505) Weight:  [56.7 kg (125 lb)] 56.7 kg (125 lb) (02/25 0006)  Intake/Output Summary (Last 24 hours) at 03/28/2023 0726 Last data filed at 03/28/2023 0656 Gross per 24 hour  Intake 447.5 ml  Output 1400 ml  Net -952.5 ml   Current Heart Failure Medications:  Loop diuretic: none scheduled, s/p IV furosemide 03/27/23 Beta-Blocker: none ACEI/ARB/ARNI: none MRA: none SGLT2i: none Other: none  Prior  to admission Heart Failure Medications:  Loop diuretic: none Beta-Blocker: carvedilol 6.25 mg BID ACEI/ARB/ARNI: none MRA: none SGLT2i: none Other: none  Assessment: 1. Acute on chronic systolic heart failure (LVEF 35-40%)  , due to NICM, likely from cocaine use. NYHA class IV symptoms.  -Symptoms: Reports breathing is much improved after furosemide and BiPAP. Denies LEE. Appetite is good. -Volume: Volume is difficult to assess. No LEE noted. JVP seen at jaw, though BiPAP may falsely elevate this. BNP was elevated on admission. May have experienced  flash pulmonary edema from cocaine use. Symptoms could also be from COPD. Given that creatinine is stable after IV furosemide yesterday, can repeat dose today. -Hemodynamics: BP is improved off of nitroglycerin.  -BB: A nonselective BB like carvedilol is not ideal given COPD, however, nonselective Bbs may be of concern given cocaine use. Carvedilol is safe if cocaine is used. Metoprolol has less data what little is available suggests no harm.  -ACEI/ARB/ARNI: Consider starting losartan 25 mg daily today. BP remains high. -MRA: Can consider adding tomorrow pending labs. -SGLT2i: Consider starting Farxiga 10 mg daily today. -Patient denies cocaine use within the past week, however his UDS was positive on admission. He may have kept this to himself with his sister in the room.  Plan: 1) Medication changes recommended at this time: -Consider redosing furosemide 40 mg IV today.   2) Patient assistance: -Patient receives medications through the [place] for no cost.  3) Education: - Patient has been educated on current HF medications and potential additions to HF medication regimen - Patient verbalizes understanding that over the next few months, these medication doses may change and more medications may be added to optimize HF regimen - Patient has been educated on basic disease state pathophysiology and goals of therapy  Medication Assistance / Insurance Benefits Check: Does the patient have prescription insurance?    Type of insurance plan:  Does the patient qualify for medication assistance through manufacturers or grants? No   [facility]      Please do not hesitate to reach out with questions or concerns,  Enos Fling, PharmD, CPP, BCPS Heart Failure Pharmacist  Phone - [phone] 03/28/2023 1:00 PM

## 2023-03-28 NOTE — ED Provider Notes (Signed)
 [facility] Provider Note    Event Date/Time   First MD Initiated Contact with Patient 03/28/23 0008     (approximate)   History   Respiratory Distress   HPI  Christopher Bryan. is a 63 y.o. male atrial fibrillation, CHF, COPD not on any oxygen, hypertension, history of DVT on Eliquis presenting with shortness of breath.  Per EMS patient called for respiratory distress, found to be satting 78% on room air, wheezing throughout, placed on a CPAP, given 125 mg of Solu-Medrol, 2 g of mag, 2 DuoNebs, 2 inch of nitroglycerin paste because his blood pressures were elevated over systolics of 200s.  Patient denying chest pain.  No leg swelling.  Independent history obtained from EMS.   On independent chart review he was admitted in January for respiratory failure with hypoxia as well as COPD exacerbation.     Physical Exam   Triage Vital Signs: ED Triage Vitals  Encounter Vitals Group     BP --      Systolic BP Percentile --      Diastolic BP Percentile --      Pulse --      Resp --      Temp --      Temp src --      SpO2 03/27/23 2359 100 %     Weight 03/28/23 0006 125 lb (56.7 kg)     Height 03/28/23 0006 5\' 7"  (1.702 m)     Head Circumference --      Peak Flow --      Pain Score 03/28/23 0006 0     Pain Loc --      Pain Education --      Exclude from Growth Chart --     Most recent vital signs: Vitals:   03/28/23 0235 03/28/23 0240  BP: 96/68 109/78  Pulse: 95 92  Resp: 14 13  Temp:    SpO2: 95% 96%     General: Awake, tachypneic CV:  Good peripheral perfusion.  Resp:  Normal effort.  No wheezing or crackles, he does have some retractions. Abd:  No distention.  Soft nontender Other:  No lower extremity edema   ED Results / Procedures / Treatments   Labs (all labs ordered are listed, but only abnormal results are displayed) Labs Reviewed  COMPREHENSIVE METABOLIC PANEL - Abnormal; Notable for the following components:       Result Value   Glucose, Bld 182 (*)    AST 75 (*)    All other components within normal limits  BRAIN NATRIURETIC PEPTIDE - Abnormal; Notable for the following components:   B Natriuretic Peptide 507.8 (*)    All other components within normal limits  CBC WITH DIFFERENTIAL/PLATELET - Abnormal; Notable for the following components:   Hemoglobin 12.9 (*)    Neutro Abs 1.1 (*)    Lymphs Abs 7.9 (*)    All other components within normal limits  BLOOD GAS, VENOUS - Abnormal; Notable for the following components:   pH, Ven 7.02 (*)    pCO2, Ven 102 (*)    pO2, Ven <31 (*)    Acid-base deficit 7.1 (*)    All other components within normal limits  BLOOD GAS, VENOUS - Abnormal; Notable for the following components:   pH, Ven 7.08 (*)    pCO2, Ven 102 (*)    pO2, Ven <31 (*)    Bicarbonate 30.2 (*)    Acid-base deficit 2.7 (*)  All other components within normal limits  TROPONIN I (HIGH SENSITIVITY) - Abnormal; Notable for the following components:   Troponin I (High Sensitivity) 23 (*)    All other components within normal limits  TROPONIN I (HIGH SENSITIVITY) - Abnormal; Notable for the following components:   Troponin I (High Sensitivity) 25 (*)    All other components within normal limits  RESP PANEL BY RT-PCR (RSV, FLU A&B, COVID)  RVPGX2  PROTIME-INR  APTT  PATHOLOGIST SMEAR REVIEW  CBC  BASIC METABOLIC PANEL  MAGNESIUM  PHOSPHORUS  BLOOD GAS, VENOUS  PROCALCITONIN     EKG EKG showed sinus rhythm, rate 91, normal QS, QTc, no ischemic ST elevation, T wave inversions to inferior leads with ST depressions, T wave inversions to lateral leads, T wave changes and ST depressions are old compared to prior    RADIOLOGY Chest x-ray on my interpretation, left opacity noted.   PROCEDURES:  Critical Care performed: Yes, see critical care procedure note(s)  .Critical Care  Performed by: Claybon Jabs, MD Authorized by: Claybon Jabs, MD   Critical care provider statement:     Critical care time (minutes):  40   Critical care was necessary to treat or prevent imminent or life-threatening deterioration of the following conditions:  Respiratory failure   Critical care was time spent personally by me on the following activities:  Development of treatment plan with patient or surrogate, discussions with consultants, evaluation of patient's response to treatment, examination of patient, ordering and review of laboratory studies, ordering and review of radiographic studies, ordering and performing treatments and interventions, pulse oximetry, re-evaluation of patient's condition and review of old charts Ultrasound ED Echo  Date/Time: 03/28/2023 2:52 AM  Performed by: Claybon Jabs, MD Authorized by: Claybon Jabs, MD   Procedure details:    Indications: dyspnea     Views: subxiphoid, parasternal long axis view, apical 4 chamber view and IVC view   Findings:    Pericardium: no pericardial effusion     LV Function: depressed (30 - 50%)     RV Diameter: normal     IVC: dilated   Impression:    Impression comment:  Diminished contractility Ultrasound ED Thoracic  Date/Time: 03/28/2023 2:53 AM  Performed by: Claybon Jabs, MD Authorized by: Claybon Jabs, MD   Procedure details:    Indications: dyspnea     Left lung pleural:  Visualized   Right lung pleural:  Visualized Findings:    B-lines noted throughout: identified   Impression:    Impression: pulmonary edema   Comments:     B-lines noted bilaterally    MEDICATIONS ORDERED IN ED: Medications  nitroGLYCERIN 50 mg in dextrose 5 % 250 mL (0.2 mg/mL) infusion (50 mcg/min Intravenous Rate/Dose Change 03/28/23 0235)  doxycycline (VIBRAMYCIN) 100 mg in sodium chloride 0.9 % 250 mL IVPB (100 mg Intravenous New Bag/Given 03/28/23 0212)  docusate sodium (COLACE) capsule 100 mg (has no administration in time range)  polyethylene glycol (MIRALAX / GLYCOLAX) packet 17 g (has no administration in time range)  budesonide  (PULMICORT) nebulizer solution 0.25 mg (has no administration in time range)  ipratropium-albuterol (DUONEB) 0.5-2.5 (3) MG/3ML nebulizer solution 3 mL (has no administration in time range)  ipratropium-albuterol (DUONEB) 0.5-2.5 (3) MG/3ML nebulizer solution 3 mL (has no administration in time range)  methylPREDNISolone sodium succinate (SOLU-MEDROL) 40 mg/mL injection 40 mg (has no administration in time range)  cefTRIAXone (ROCEPHIN) 1 g in sodium chloride 0.9 % 100 mL IVPB (  has no administration in time range)  azithromycin (ZITHROMAX) 500 mg in sodium chloride 0.9 % 250 mL IVPB (has no administration in time range)  morphine (PF) 2 MG/ML injection 2 mg (has no administration in time range)  enoxaparin (LOVENOX) injection 57.5 mg (has no administration in time range)  ondansetron (ZOFRAN) 4 MG/2ML injection (4 mg  Given 03/28/23 0010)  furosemide (LASIX) injection 40 mg (40 mg Intravenous Given 03/28/23 0015)  ipratropium-albuterol (DUONEB) 0.5-2.5 (3) MG/3ML nebulizer solution 6 mL (6 mLs Nebulization Given 03/28/23 0208)  cefTRIAXone (ROCEPHIN) 1 g in sodium chloride 0.9 % 100 mL IVPB (0 g Intravenous Stopped 03/28/23 0240)  morphine (PF) 2 MG/ML injection 2 mg (2 mg Intravenous Given 03/28/23 0241)     IMPRESSION / MDM / ASSESSMENT AND PLAN / ED COURSE  I reviewed the triage vital signs and the nursing notes.                              Differential diagnosis includes, but is not limited to, COPD exacerbation, CHF exacerbation, hypertensive emergency with pulmonary edema, viral illness, pneumonia, ACS.  Will get labs, EKG, troponin, chest x-ray,  Patient's presentation is most consistent with acute presentation with potential threat to life or bodily function.  Independent review of labs and imaging below.  His repeat VBG was not significant change with the BiPAP.  Consult ICU and they will admit him for further management of his COPD as well as CHF exacerbation.  Given the opacity on  chest x-ray, also started him on ceftriaxone and doxycycline for CAP.  He is admitted.  Clinical Course as of 03/28/23 0256  Tue Mar 28, 2023  0157 Independent review of labs, he has a respiratory acidosis.  No leukocytosis, respiratory virus panel is negative, troponins mildly elevated, electrolytes not severely deranged. [TT]  0158 He is on the BiPAP now, will give him a bit more DuoNebs. [TT]  0158 DG Chest Portable 1 View IMPRESSION: 1. Minimal strandy and patchy opacities in the left lung base, atelectasis versus infiltrate. 2. Blunting of the costophrenic angles, which may represent small pleural effusions.   [TT]  0158 Will treat him for also small pneumonia. [TT]  0201 Consult to ICU for admission, they say that if he is on nitro drip he might be able to go to stepdown. [TT]    Clinical Course User Index [TT] Jodie Echevaria, Franchot Erichsen, MD     FINAL CLINICAL IMPRESSION(S) / ED DIAGNOSES   Final diagnoses:  Acute on chronic congestive heart failure, unspecified heart failure type (HCC)  COPD exacerbation (HCC)  Hypertensive emergency  Pneumonia of left lung due to infectious organism, unspecified part of lung  Acute respiratory failure with hypoxia and hypercapnia (HCC)     Rx / DC Orders   ED Discharge Orders     None        Note:  This document was prepared using Dragon voice recognition software and may include unintentional dictation errors.    Claybon Jabs, MD 03/28/23 [phone]

## 2023-03-28 NOTE — H&P (Signed)
 NAME:  Christopher Whidbee., MRN:  [id number], DOB:  03/30/1960, LOS: 0 ADMISSION DATE:  03/27/2023, CONSULTATION DATE:  03/27/22 REFERRING MD:  Dr. Jodie Echevaria, CHIEF COMPLAINT:  Shortness of Breath   History of Present Illness:  63 yo M presenting form home via EMS on 03/27/22 for evaluation of shortness of breath.  History provided per chart review and patient bedside report. Patient describes being in his normal state of health until waking up from bed and walking to bathroom, realizing he was dyspneic. He endorses diaphoresis as well. He denies fever/ chills, chest pain/palpitations, headache, blurred vision, falls, pain, vomiting/diarrhea/ abdominal pain. Upon arrival in the ED he was nauseous. He doesn't wear oxygen at home and has been taking all medication as prescribed including inhalers and Eliquis for history of DVT.  He admits to tobacco use, stating he quit 3-4 weeks ago and only smoked 3-4 cigarettes a week. He denies regular ETOH use, stating he drinks only a beer weekly and initially denied any recreational drug use. Upon more questioning he admitted to using cocaine, with last usage one week ago.  Of note patient was hospitalized in December 2024 for PEA arrest, positive for cocaine and documented as drinking 6 beers nightly. He has not been able to see cardiology outpatient since that admission in December and is working on getting an appointment with the [place].  EMS reported finding the patient at 78% on RA, with SBP in 200's, diffuse wheezing auscultated, placed on CPAP. He received 125 mg of solu medrol, 2 g of Mg, 2 duo nebs and 2 inch nitroglycerin paste.  ED course: Upon arrival patient alert and responsive but lethargic, tachycardic, with hypertensive urgency and tachypnea. He ws placed on BIPAP and nitroglycerin drip started. Labs significant for respiratory acidosis, mildly elevated AST, elevated BNP, mildly elevated and flat troponin, & mild hyperglycemia. CXR concerning for  pneumonia vs atelectasis. VBG did not improve after treatments and PCCM consulted due to high risk for intubation. Medications given: lasix, rocephin & doxycycline, duo nebs x 2, zofran and nitroglycerin drip started. Initial Vitals: 97.4, 21, 109, 197/111 & 93% on BIPAP @ 60%  Significant labs: (Labs/ Imaging personally reviewed) I, Cheryll Cockayne Rust-Chester, AGACNP-BC, personally viewed and interpreted this ECG. EKG Interpretation: Date: 03/28/23, EKG Time: 02:39, Rate: 91, Rhythm: NSR, QRS Axis:  normal, Intervals: normal, ST/T Wave abnormalities: mild inferior ST depression & inferolateral T wave inversions (unchanged from previous EKG's), Narrative Interpretation: NSR with unchanged signs of ischemia Chemistry: Na+: 144, K+: 3.9, BUN/Cr.: 21/ 1.18, Serum CO2/ AG: 25/ 14, AST: 75 Hematology: WBC: 9.7, Hgb: 12.9,  Troponin: 23 > 25, BNP: 507.8, Lactic/ PCT: pending, COVID-19 & Influenza A/B: negative UDS: pending  VBG: 7.02/102/<31/ 26.3 >> 7.08/ 102/ <31/ 30.2 CXR 03/28/23:  Minimal strandy and patchy opacities in the left lung base, atelectasis versus infiltrate. Blunting of the costophrenic angles, which may represent small pleural effusions.  PCCM consulted for admission due to acute hypoxic and hypercapnic respiratory failure requiring BIPAP support with HIGH RISK for INTUBATION s/t AECOPD and HFrEF exacerbation +/- CAP.  Pertinent  Medical History  HFrEF (echo 01/2023: LVEF 35-40% with global hypokinesis & wall motion abnormalities) NICM Atrial Fibrillation COPD Tobacco use PAD HTN DVT/ (PE-per pt report) on Eliquis PEA arrest (01/2023) Cocaine Abuse  Significant Hospital Events: Including procedures, antibiotic start and stop dates in addition to other pertinent events   03/28/23: Admit to SDU by PCCM for acute hypoxic and hypercapnic respiratory failure requiring BIPAP support  with HIGH RISK for INTUBATION s/t AECOPD and HFrEF exacerbation +/- CAP.  Interim History /  Subjective:  Patient Alert and oriented, able to speak in complete sentences on BIPAP support at 60%. Anxious, complaining that he can't breathe on the BIPAP settings.  Discussed plan of care, and high risk for intubation- patient understands and wants intubation if necessary. All questions and concerns answered at this time.  Patient requested his sister Malachi Bonds be called, called and updated his sister via telephone.  Objective   Blood pressure 109/78, pulse 92, temperature (!) 97.4 F (36.3 C), temperature source Oral, resp. rate 13, height 5\' 7"  (1.702 m), weight 56.7 kg, SpO2 96%.    FiO2 (%):  [40 %-60 %] 40 %   Intake/Output Summary (Last 24 hours) at 03/28/2023 0244 Last data filed at 03/28/2023 0240 Gross per 24 hour  Intake 100 ml  Output 550 ml  Net -450 ml   Filed Weights   03/28/23 0006  Weight: 56.7 kg    Examination: General: Adult male, acutely ill, sitting upright in bed on BIPAP, uncomfortable appearing but NAD HEENT: MM pink/moist, anicteric, atraumatic, neck supple Neuro: A&O x 4, able to follow commands, PERRL +3, MAE CV: s1s2 RRR, NSR on monitor, no r/m/g Pulm: Regular, mildly labored on BIPAP @ 60%, breath sounds diminished throughout GI: soft, flat, non tender, bs x 4 Skin: limited exam - scabbed old abrasion on R forearm noted Extremities: warm/dry, pulses + 2 R/P, no edema noted  Resolved Hospital Problem list     Assessment & Plan:  Acute Hypoxic/ Hypercapnic Respiratory Failure suspect multifocal secondary to AECOPD & HFrEF exacerbation +/- CAP PMHx: Tobacco use, COPD - Continue BIPAP overnight, wean FiO2 as tolerated - Supplemental O2 to maintain SpO2 > 88% - Intermittent chest x-ray & ABG PRN - Ensure adequate pulmonary hygiene  - F/u cultures, trend PCT - Continue CAP coverage: ceftriaxone & azithromycin - solu medrol 40 mg BID - morphine 2 mg Q 3 h PRN for increased WOB - budesonide nebs BID, Duo nebs Q 4 h, bronchodilators PRN  Acute  hypoxic respiratory faliure secodary to acute decompensated systolic (HFrEF) heart failure:  Acute on Chronic HFrEF exacerbation Hypertensive Urgency - resolved PMHx: CHF, HTN, A-fib - Continuous cardiac monitoring  - Strict I/O's: alert provider if UOP < 0.5 mL/kg/hr - Daily BMP, replace electrolytes PRN - Daily weights to assess volume status - Diurese with the use of IV lasix as renal function and hemodynamics allow - hold outpatient PO regimen, consider restarting once patient able to tolerate PO's safely - hydralazine PRN for SBP > 160, nitroglycerin drip being weaned off due to marginal BP - Supplemental oxygen as needed, maintain SpO2 > 90% - Consider NPPV with BIPAP thereapy as needed  - Consider Cardiology consultation  Tobacco Use Cocaine Use ETOH use Patient reports using no substances recently and states he has cut back. Will monitor. UDS pending - cessation counseling provided - consider CIWA protocol PRN - thiamine & folic acid ordered  DVT/ PE history PAD - pt NPO due to BIPAP, high risk for intubation - eliquis held for now, systemic LMWH ordered, transition once able to tolerate PO meds safely - hold plavix, restart when able  Mild Transaminitis  - Trend hepatic function - Consider RUQ US/CT abdomen/pelvis if needed - avoid hepatotoxic agents  Steroid Induced Hyperglycemia - Monitor CBG Q 4 hours - initiate SSI if CBG > 180 - target range while in ICU: 140-180 - follow ICU hyper/hypo-glycemia  protocol  Best Practice (right click and "Reselect all SmartList Selections" daily)  Diet/type: NPO DVT prophylaxis systemic dose LMWH Pressure ulcer(s): N/A GI prophylaxis: N/A Lines: N/A Foley:  N/A Code Status:  full code Last date of multidisciplinary goals of care discussion [03/28/23]  Labs   CBC: Recent Labs  Lab 03/28/23 0014  WBC 9.7  NEUTROABS 1.1*  HGB 12.9*  HCT 41.6  MCV 91.6  PLT 205    Basic Metabolic Panel: Recent Labs  Lab  03/28/23 0014  NA 144  K 3.9  CL 105  CO2 25  GLUCOSE 182*  BUN 21  CREATININE 1.18  CALCIUM 9.0   GFR: Estimated Creatinine Clearance: 50.1 mL/min (by C-G formula based on SCr of 1.18 mg/dL). Recent Labs  Lab 03/28/23 0014  WBC 9.7    Liver Function Tests: Recent Labs  Lab 03/28/23 0014  AST 75*  ALT 37  ALKPHOS 96  BILITOT 0.5  PROT 7.2  ALBUMIN 4.1   No results for input(s): "LIPASE", "AMYLASE" in the last 168 hours. No results for input(s): "AMMONIA" in the last 168 hours.  ABG    Component Value Date/Time   PHART 7.428 01/30/2023 1640   PCO2ART 36.5 01/30/2023 1640   PO2ART 62 (L) 01/30/2023 1640   HCO3 30.2 (H) 03/28/2023 0206   TCO2 25 01/30/2023 1640   ACIDBASEDEF 2.7 (H) 03/28/2023 0206   O2SAT 26.3 03/28/2023 0206     Coagulation Profile: Recent Labs  Lab 03/28/23 0014  INR 1.1    Cardiac Enzymes: No results for input(s): "CKTOTAL", "CKMB", "CKMBINDEX", "TROPONINI" in the last 168 hours.  HbA1C: Hgb A1c MFr Bld  Date/Time Value Ref Range Status  03/01/2023 04:44 PM 5.6 4.8 - 5.6 % Final    Comment:    (NOTE) Pre diabetes:          5.7%-6.4%  Diabetes:              >6.4%  Glycemic control for   <7.0% adults with diabetes   06/21/2016 06:23 AM 5.3 4.8 - 5.6 % Final    Comment:    (NOTE)         Pre-diabetes: 5.7 - 6.4         Diabetes: >6.4         Glycemic control for adults with diabetes: <7.0     CBG: No results for input(s): "GLUCAP" in the last 168 hours.  Review of Systems: positives in BOLD  Gen: Denies diaphoresis, fever, chills, weight change, fatigue, night sweats HEENT: Denies blurred vision, double vision, hearing loss, tinnitus, sinus congestion, rhinorrhea, sore throat, neck stiffness, dysphagia PULM: Denies shortness of breath, cough, sputum production, hemoptysis, wheezing CV: Denies chest pain, edema, orthopnea, paroxysmal nocturnal dyspnea, palpitations GI: Denies abdominal pain, nausea, vomiting, diarrhea,  hematochezia, melena, constipation, change in bowel habits GU: Denies dysuria, hematuria, polyuria, oliguria, urethral discharge Endocrine: Denies hot or cold intolerance, polyuria, polyphagia or appetite change Derm: Denies rash, dry skin, scaling or peeling skin change Heme: Denies easy bruising, bleeding, bleeding gums Neuro: Denies headache, numbness, weakness, slurred speech, loss of memory or consciousness  Past Medical History:  He,  has a past medical history of A-fib (HCC), CHF (congestive heart failure) (HCC), COPD (chronic obstructive pulmonary disease) (HCC), DVT (deep venous thrombosis) (HCC), Hypertension, Peripheral arterial disease (HCC), Right-sided heart failure (HCC), and Smoker.   Surgical History:   Past Surgical History:  Procedure Laterality Date   KNEE ARTHROSCOPY Left    LOWER EXTREMITY ANGIOGRAPHY Left  01/21/2020   Procedure: Lower Extremity Angiography;  Surgeon: Renford Dills, MD;  Location: [facility] INVASIVE CV LAB;  Service: Cardiovascular;  Laterality: Left;   LOWER EXTREMITY ANGIOGRAPHY Left 09/08/2021   Procedure: Lower Extremity Angiography;  Surgeon: Annice Needy, MD;  Location: [facility] INVASIVE CV LAB;  Service: Cardiovascular;  Laterality: Left;   LOWER EXTREMITY ANGIOGRAPHY Left 06/20/2022   Procedure: Lower Extremity Angiography;  Surgeon: Annice Needy, MD;  Location: [facility] INVASIVE CV LAB;  Service: Cardiovascular;  Laterality: Left;   ORIF FEMUR FRACTURE Right    RIGHT/LEFT HEART CATH AND CORONARY ANGIOGRAPHY N/A 01/30/2023   Procedure: RIGHT/LEFT HEART CATH AND CORONARY ANGIOGRAPHY;  Surgeon: Yvonne Kendall, MD;  Location: [facility] INVASIVE CV LAB;  Service: Cardiovascular;  Laterality: N/A;     Social History:   reports that he has been smoking cigarettes. He has a 20 pack-year smoking history. He has never used smokeless tobacco. He reports that he does not currently use alcohol. He reports current drug use. Frequency: 1.00 time per week. Drugs: "Crack"  cocaine and Marijuana.   Family History:  His family history includes CAD in his mother; Emphysema in his father.   Allergies Allergies  Allergen Reactions   Iodine Rash   Ivp Dye [Iodinated Contrast Media] Hives   Latex Hives and Itching   Iodinated Contrast Media Hives and Rash   Other Itching and Rash    [place] Meat allergy per patient     Home Medications  Prior to Admission medications   Medication Sig Start Date End Date Taking? Authorizing Provider  acetaminophen (TYLENOL) 325 MG tablet Take 650 mg by mouth 3 (three) times daily as needed for mild pain or fever. Patient not taking: Reported on 03/01/2023    [provider]  albuterol (PROVENTIL HFA;VENTOLIN HFA) 108 (90 Base) MCG/ACT inhaler Inhale 2 puffs into the lungs every 6 (six) hours as needed for wheezing or shortness of breath.    [provider]  apixaban (ELIQUIS) 5 MG TABS tablet Take 1 tablet (5 mg total) by mouth 2 (two) times daily. 06/24/22   Georgiana Spinner, NP  aspirin EC 81 MG tablet Take 1 tablet (81 mg total) by mouth daily. Swallow whole. Patient not taking: Reported on 03/01/2023 06/24/22   Georgiana Spinner, NP  atorvastatin (LIPITOR) 40 MG tablet Take 20 mg by mouth at bedtime.    [provider]  carboxymethylcellulose (REFRESH PLUS) 0.5 % SOLN Place 1 drop into both eyes 4 (four) times daily as needed. Patient not taking: Reported on 03/01/2023 09/09/21   Alford Highland, MD  carvedilol (COREG) 6.25 MG tablet Take 6.25 mg by mouth 2 (two) times daily with a meal.    [provider]  cholecalciferol (VITAMIN D) 1000 units tablet Take 2,000 Units by mouth daily.    [provider]  clopidogrel (PLAVIX) 75 MG tablet Take 1 tablet (75 mg total) by mouth daily. 09/10/21   Alford Highland, MD  diclofenac Sodium (VOLTAREN) 1 % GEL Apply 2 g topically 3 (three) times daily. Patient not taking: Reported on 03/01/2023    [provider]  famotidine (PEPCID) 20 MG  tablet Take 20 mg by mouth 2 (two) times daily as needed for heartburn or indigestion. Patient not taking: Reported on 03/01/2023    [provider]  fluticasone-salmeterol (ADVAIR) 250-50 MCG/ACT AEPB Inhale 1 puff into the lungs in the morning and at bedtime.    [provider]  gabapentin (NEURONTIN) 400 MG capsule Take 1  capsule (400 mg total) by mouth 3 (three) times daily for 20 days. Patient taking differently: Take 800 mg by mouth 3 (three) times daily. 01/24/20 09/07/21  Tresa Moore, MD  hydrOXYzine (ATARAX) 25 MG tablet Take 25 mg by mouth 3 (three) times daily as needed (sleep). Patient not taking: Reported on 03/01/2023    [provider]  lidocaine (LIDODERM) 5 % Place 1 patch onto the skin daily. Remove & Discard patch within 12 hours or as directed by MD    [provider]  melatonin 3 MG TABS tablet Take 3 mg by mouth at bedtime.    [provider]  methocarbamol (ROBAXIN) 500 MG tablet Take 1 tablet (500 mg total) by mouth every 8 (eight) hours as needed for muscle spasms. Patient not taking: Reported on 03/01/2023 11/05/22   Janith Lima, MD  methocarbamol (ROBAXIN) 500 MG tablet Take 500 mg by mouth every 8 (eight) hours as needed for muscle spasms.    [provider]  mirtazapine (REMERON) 7.5 MG tablet Take 7.5 mg by mouth at bedtime.    [provider]  nicotine polacrilex (COMMIT) 4 MG lozenge Take 4 mg by mouth as needed. DISSOLVE 1 LOZENGE BY MOUTH AS DIRECTED FOR SMOKING CESSATION USE AT LEAST 8-10 LOZENGES EACH DAY TO START. DO NOT USE MORE THAN 20 LOZENGES IN A DAY. DO NOT EAT OR DRINK FOR 15 MINUTES BEFORE AND DURING USE 12/26/22   [provider]  oxyCODONE-acetaminophen (PERCOCET/ROXICET) 5-325 MG tablet Take 1-2 tablets by mouth every 6 (six) hours as needed for moderate pain or severe pain. Patient not taking: Reported on 03/01/2023 06/24/22   Georgiana Spinner, NP  pantoprazole (PROTONIX) 20 MG  tablet Take 20 mg by mouth daily.    [provider]  sennosides-docusate sodium (SENOKOT-S) 8.6-50 MG tablet Take 1-2 tablets by mouth at bedtime as needed for constipation. Patient not taking: Reported on 03/01/2023    [provider]  sildenafil (VIAGRA) 100 MG tablet Take 100 mg by mouth daily as needed for erectile dysfunction.    [provider]  tamsulosin (FLOMAX) 0.4 MG CAPS capsule Take 0.4 mg by mouth at bedtime.    [provider]  thiamine (VITAMIN B1) 100 MG tablet Take 100 mg by mouth daily. Patient not taking: Reported on 03/01/2023    [provider]  tiotropium (SPIRIVA) 18 MCG inhalation capsule Place 18 mcg into inhaler and inhale daily.    [provider]     Critical care time: 65 minutes       Betsey Holiday, AGACNP-BC Acute Care Nurse Practitioner [facility]   [phone] / [phone] Please see Amion for details.

## 2023-03-28 NOTE — ED Notes (Signed)
 Called RT Christopher Bryan to advised trying pt on Bi-pap as medication for nausea has been given, provider would like pt on Bi-pap

## 2023-03-28 NOTE — ED Notes (Signed)
 ICU NP at bedside to consult pt, RT also at bedside to reduce pressure to Bi-pap as pt is not handling well, pt is wanting to remove mask as he feels breathing is difficult. NP has spoken with pt about possible intubation if pt is unable to tolerate Bi-pap

## 2023-03-28 NOTE — Consult Note (Signed)
 PHARMACY CONSULT NOTE - ELECTROLYTES  Pharmacy Consult for Electrolyte Monitoring and Replacement   Recent Labs: Height: 5\' 7"  (170.2 cm) Weight: 56.7 kg (125 lb) IBW/kg (Calculated) : 66.1 Estimated Creatinine Clearance: 56.3 mL/min (by C-G formula based on SCr of 1.05 mg/dL). Potassium (mmol/L)  Date Value  03/28/2023 4.5   Magnesium (mg/dL)  Date Value  16/11/9602 2.3   Calcium (mg/dL)  Date Value  54/10/8117 8.5 (L)   Albumin (g/dL)  Date Value  14/78/2956 3.7   Phosphorus (mg/dL)  Date Value  21/30/8657 4.6   Sodium (mmol/L)  Date Value  03/28/2023 141    Assessment  Christopher Bryan. is a 63 y.o. male presenting with shortness of breath. PMH significant for HFrEF, Afib(on apixaban PTA), COPD, PAD, HDN, DVT, cocaine abuse. Pharmacy has been consulted to monitor and replace electrolytes.  Diet: NPO Pertinent medications: N/A  Goal of Therapy: Electrolytes WNL  Plan:  No replacement currently indicated Check BMP, Mg, Phos with AM labs  Thank you for allowing pharmacy to be a part of this patient's care.  Bettey Costa, PharmD Clinical Pharmacist 03/28/2023 7:26 AM

## 2023-03-28 NOTE — ED Notes (Signed)
 Per NP Micheline Rough, stop (turn off) the Nitroglycerin infusion d/t soft SBP, MAP remains >65

## 2023-03-28 NOTE — ED Notes (Signed)
 Resumed care from logan rn.  Pt alert.  No acute resp distress.  Iv in place

## 2023-03-28 NOTE — ED Notes (Signed)
 2 inch nitroglycerin paste was removed from pt's left chest (area cleansed from residue) as per verbal order from NP [facility]. Paste was placed by EMS PTA.

## 2023-03-28 NOTE — ED Notes (Signed)
 RT Cory at bedside to trail pt on Bi-pap

## 2023-03-28 NOTE — ED Notes (Signed)
 Verbal order to reduce Nitro drip rate to 50 mcg/min per NP Britton d/t BP 99/70, monitor BP, hold morphine until BP improves.

## 2023-03-28 NOTE — Progress Notes (Signed)
 PHARMACY - ANTICOAGULATION CONSULT NOTE  Pharmacy Consult for Lovenox Indication: PE, hx of DVT   Allergies  Allergen Reactions   Iodine Rash   Ivp Dye [Iodinated Contrast Media] Hives   Latex Hives and Itching   Iodinated Contrast Media Hives and Rash   Other Itching and Rash    [place] Meat allergy per patient    Patient Measurements: Height: 5\' 7"  (170.2 cm) Weight: 56.7 kg (125 lb) IBW/kg (Calculated) : 66.1 Heparin Dosing Weight:   Vital Signs: Temp: 97.4 F (36.3 C) (02/25 0027) Temp Source: Oral (02/25 0027) BP: 109/78 (02/25 0240) Pulse Rate: 92 (02/25 0240)  Labs: Recent Labs    03/28/23 0014 03/28/23 0205  HGB 12.9*  --   HCT 41.6  --   PLT 205  --   APTT 29  --   LABPROT 14.4  --   INR 1.1  --   CREATININE 1.18  --   TROPONINIHS 23* 25*    Estimated Creatinine Clearance: 50.1 mL/min (by C-G formula based on SCr of 1.18 mg/dL).   Medical History: Past Medical History:  Diagnosis Date   A-fib (HCC)    CHF (congestive heart failure) (HCC)    COPD (chronic obstructive pulmonary disease) (HCC)    DVT (deep venous thrombosis) (HCC)    Hypertension    Peripheral arterial disease (HCC)    Right-sided heart failure (HCC)    Smoker     Medications:    Assessment: Pharmacy consulted to dose lovenox in this 63 year old male admitted with resp failure.  Pt was on Eliquis 5 mg PO BID PTA, unsure of last dose.  CrCl = 50.1 ml/min   Goal of Therapy:  Prevention of thromboembolism Monitor platelets by anticoagulation protocol: Yes   Plan:  Lovenox 57.5 mg (1 mg/kg) SQ Q12H ordered to start on 2/25 @ ~ 0800. - will check CBC daily   Tammala Weider D 03/28/2023,2:55 AM

## 2023-03-29 ENCOUNTER — Encounter: Payer: Self-pay | Admitting: Pulmonary Disease

## 2023-03-29 DIAGNOSIS — J441 Chronic obstructive pulmonary disease with (acute) exacerbation: Secondary | ICD-10-CM | POA: Diagnosis not present

## 2023-03-29 DIAGNOSIS — I739 Peripheral vascular disease, unspecified: Secondary | ICD-10-CM

## 2023-03-29 DIAGNOSIS — I5023 Acute on chronic systolic (congestive) heart failure: Secondary | ICD-10-CM

## 2023-03-29 DIAGNOSIS — J9601 Acute respiratory failure with hypoxia: Secondary | ICD-10-CM | POA: Diagnosis not present

## 2023-03-29 DIAGNOSIS — F1410 Cocaine abuse, uncomplicated: Secondary | ICD-10-CM

## 2023-03-29 DIAGNOSIS — R7989 Other specified abnormal findings of blood chemistry: Secondary | ICD-10-CM

## 2023-03-29 DIAGNOSIS — S41101S Unspecified open wound of right upper arm, sequela: Secondary | ICD-10-CM | POA: Diagnosis not present

## 2023-03-29 LAB — CBC
HCT: 34.1 % — ABNORMAL LOW (ref 39.0–52.0)
Hemoglobin: 11.2 g/dL — ABNORMAL LOW (ref 13.0–17.0)
MCH: 27.9 pg (ref 26.0–34.0)
MCHC: 32.8 g/dL (ref 30.0–36.0)
MCV: 85 fL (ref 80.0–100.0)
Platelets: 151 10*3/uL (ref 150–400)
RBC: 4.01 MIL/uL — ABNORMAL LOW (ref 4.22–5.81)
RDW: 13.2 % (ref 11.5–15.5)
WBC: 5.4 10*3/uL (ref 4.0–10.5)
nRBC: 0 % (ref 0.0–0.2)

## 2023-03-29 LAB — BLOOD GAS, VENOUS
Bicarbonate: 26.3 mmol/L — ABNORMAL HIGH (ref 20.0–28.0)
O2 Saturation: 25.6 mmol/L — ABNORMAL HIGH (ref 0.0–2.0)
Patient temperature: 37
Patient temperature: 37 %
pCO2, Ven: 102 mmHg (ref 44–60)
pH, Ven: 7.02 — CL (ref 7.25–7.43)
pO2, Ven: <31 mmol/L — CL (ref 32–45)

## 2023-03-29 LAB — BASIC METABOLIC PANEL
Anion gap: 7 (ref 5–15)
BUN: 20 mg/dL (ref 8–23)
CO2: 24 mmol/L (ref 22–32)
Calcium: 8.5 mg/dL — ABNORMAL LOW (ref 8.9–10.3)
Chloride: 106 mmol/L (ref 98–111)
Creatinine, Ser: 0.84 mg/dL (ref 0.61–1.24)
GFR, Estimated: >60 mL/min (ref >60)
Glucose, Bld: 162 mg/dL — ABNORMAL HIGH (ref 70–99)
Potassium: 4.2 mmol/L (ref 3.5–5.1)
Sodium: 137 mmol/L (ref 135–145)

## 2023-03-29 LAB — GLUCOSE, CAPILLARY
Glucose-Capillary: 115 mg/dL — ABNORMAL HIGH (ref 70–99)
Glucose-Capillary: 140 mg/dL — ABNORMAL HIGH (ref 70–99)
Glucose-Capillary: 148 mg/dL — ABNORMAL HIGH (ref 70–99)
Glucose-Capillary: 150 mg/dL — ABNORMAL HIGH (ref 70–99)
Glucose-Capillary: 152 mg/dL — ABNORMAL HIGH (ref 70–99)
Glucose-Capillary: 153 mg/dL — ABNORMAL HIGH (ref 70–99)
Glucose-Capillary: 218 mg/dL — ABNORMAL HIGH (ref 70–99)

## 2023-03-29 LAB — BLOOD GAS, ARTERIAL
Acid-Base Excess: 4.2 mmol/L — ABNORMAL HIGH (ref 0.0–2.0)
Bicarbonate: 29.2 mmol/L — ABNORMAL HIGH (ref 20.0–28.0)
O2 Saturation: 95.5 %
Patient temperature: 37
pCO2 arterial: 44 mmHg (ref 32–48)
pH, Arterial: 7.43 (ref 7.35–7.45)
pO2, Arterial: 65 mmHg — ABNORMAL LOW (ref 83–108)

## 2023-03-29 LAB — PROCALCITONIN: Procalcitonin: 0.32 ng/mL

## 2023-03-29 LAB — PHOSPHORUS: Phosphorus: 2.3 mg/dL — ABNORMAL LOW (ref 2.5–4.6)

## 2023-03-29 LAB — MRSA NEXT GEN BY PCR, NASAL: MRSA by PCR Next Gen: NOT DETECTED

## 2023-03-29 LAB — HEPATIC FUNCTION PANEL
ALT: 38 U/L (ref 0–44)
AST: 35 U/L (ref 15–41)
Albumin: 3.5 g/dL (ref 3.5–5.0)
Alkaline Phosphatase: 80 U/L (ref 38–126)
Bilirubin, Direct: <0.1 mg/dL (ref 0.0–0.2)
Total Bilirubin: 0.5 mg/dL (ref 0.0–1.2)
Total Protein: 6.6 g/dL (ref 6.5–8.1)

## 2023-03-29 LAB — MAGNESIUM: Magnesium: 2 mg/dL (ref 1.7–2.4)

## 2023-03-29 MED ORDER — SPIRONOLACTONE 12.5 MG HALF TABLET
12.5000 mg | ORAL_TABLET | Freq: Every day | ORAL | Status: DC
  Administered 2023-03-30: 12.5 mg via ORAL
  Filled 2023-03-29: qty 1

## 2023-03-29 MED ORDER — POLYETHYLENE GLYCOL 3350 17 G PO PACK
17.0000 g | PACK | Freq: Every day | ORAL | Status: DC
  Administered 2023-03-29 – 2023-03-30 (×2): 17 g via ORAL
  Filled 2023-03-29 (×2): qty 1

## 2023-03-29 MED ORDER — LISINOPRIL 20 MG PO TABS
20.0000 mg | ORAL_TABLET | Freq: Every day | ORAL | Status: DC
  Administered 2023-03-29 – 2023-03-30 (×2): 20 mg via ORAL
  Filled 2023-03-29 (×2): qty 1

## 2023-03-29 MED ORDER — AZITHROMYCIN 250 MG PO TABS
250.0000 mg | ORAL_TABLET | Freq: Every day | ORAL | Status: DC
  Administered 2023-03-30: 250 mg via ORAL
  Filled 2023-03-29: qty 1

## 2023-03-29 MED ORDER — LORAZEPAM 2 MG/ML IJ SOLN
0.5000 mg | Freq: Once | INTRAMUSCULAR | Status: AC | PRN
  Administered 2023-03-29: 0.5 mg via INTRAVENOUS
  Filled 2023-03-29: qty 1

## 2023-03-29 MED ORDER — MIRTAZAPINE 15 MG PO TABS
7.5000 mg | ORAL_TABLET | Freq: Every day | ORAL | Status: DC
  Administered 2023-03-29: 7.5 mg via ORAL
  Filled 2023-03-29: qty 1

## 2023-03-29 NOTE — Consult Note (Signed)
 PHARMACY CONSULT NOTE - ELECTROLYTES  Pharmacy Consult for Electrolyte Monitoring and Replacement   Recent Labs: Height: 5\' 7"  (170.2 cm) Weight: 53.6 kg (118 lb 2.7 oz) IBW/kg (Calculated) : 66.1 Estimated Creatinine Clearance: 66.5 mL/min (by C-G formula based on SCr of 0.84 mg/dL). Potassium (mmol/L)  Date Value  03/29/2023 4.2   Magnesium (mg/dL)  Date Value  45/40/9811 2.0   Calcium (mg/dL)  Date Value  91/47/8295 8.5 (L)   Albumin (g/dL)  Date Value  62/13/0865 3.5   Phosphorus (mg/dL)  Date Value  78/46/9629 2.3 (L)   Sodium (mmol/L)  Date Value  03/29/2023 137    Assessment  Christopher Gerardo. is a 63 y.o. male presenting with shortness of breath. PMH significant for HFrEF, Afib(on apixaban PTA), COPD, PAD, HDN, DVT, cocaine abuse. Pharmacy has been consulted to monitor and replace electrolytes.  Diet: regular diet Pertinent medications: N/A  Goal of Therapy: Electrolytes WNL  Plan:  Phos = 2.3, no replacement recommended at this time. Will dc CCM electrolytes replacement and continue to follow peripherally.    Thank you for allowing pharmacy to be a part of this patient's care.  Esdras Delair Rodriguez-Guzman PharmD, BCPS 03/29/2023 8:58 AM

## 2023-03-29 NOTE — TOC Initial Note (Signed)
 Transition of Care (TOC) - Initial/Assessment Note    Patient Details  Name: Christopher Bryan. MRN: [id number] Date of Birth: [birth year]  Transition of Care [facility]) CM/SW Contact:    Margarito Liner, LCSW Phone Number: 03/29/2023, 12:38 PM  Clinical Narrative:  Readmission prevention screen complete. CSW met with patient. No supports at bedside. CSW introduced role and explained that discharge planning would be discussed. PCP is the [facility]. They transport him to appointments there but patient will start seeing specialists locally. He is working on finding transportation to these appts. CSW will add resources to his AVS. Patient gets long-term medications from the [facility] pharmacy. Short-term or new medications are sent to [facility] on [address]. No issues obtaining medications. Patient lives home alone. No home health prior to admission. He has a RW and 3-prong cane at home. He primarily uses the cane. Per MD, patient may require NIV at discharge. ABG pending. If he qualifies and gets NIV through the [place], they only do Resmeds and require IVAP settings. CSW sent secure chat to respiratory therapy to notify and will require 24-hours notice for the ordering process. No further concerns. CSW will continue to follow patient for support and facilitate return home once stable. Sister or brother-in-law will transport depending on the time of day.                Expected Discharge Plan: Home/Self Care Barriers to Discharge: Continued Medical Work up   Patient Goals and CMS Choice            Expected Discharge Plan and Services     Post Acute Care Choice: NA Living arrangements for the past 2 months: Single Family Home                                      Prior Living Arrangements/Services Living arrangements for the past 2 months: Single Family Home Lives with:: Self Patient language and need for interpreter reviewed:: Yes Do you feel safe going back to the place where you  live?: Yes      Need for Family Participation in Patient Care: Yes (Comment)   Current home services: DME Criminal Activity/Legal Involvement Pertinent to Current Situation/Hospitalization: No - Comment as needed  Activities of Daily Living   ADL Screening (condition at time of admission) Independently performs ADLs?: No Does the patient have a NEW difficulty with bathing/dressing/toileting/self-feeding that is expected to last >3 days?: No Does the patient have a NEW difficulty with getting in/out of bed, walking, or climbing stairs that is expected to last >3 days?: No Does the patient have a NEW difficulty with communication that is expected to last >3 days?: No Is the patient deaf or have difficulty hearing?: No Does the patient have difficulty seeing, even when wearing glasses/contacts?: No Does the patient have difficulty concentrating, remembering, or making decisions?: No  Permission Sought/Granted                  Emotional Assessment Appearance:: Appears stated age Attitude/Demeanor/Rapport: Engaged, Gracious Affect (typically observed): Accepting, Appropriate, Calm, Pleasant Orientation: : Oriented to Self, Oriented to Place, Oriented to  Time, Oriented to Situation Alcohol / Substance Use: Not Applicable Psych Involvement: No (comment)  Admission diagnosis:  Acute respiratory failure (HCC) [J96.00] COPD exacerbation (HCC) [J44.1] Hypertensive emergency [I16.1] Acute on chronic respiratory failure (HCC) [J96.20] Acute respiratory failure with hypoxia and hypercapnia (HCC) [  J96.01, J96.02] Pneumonia of left lung due to infectious organism, unspecified part of lung [J18.9] Acute on chronic congestive heart failure, unspecified heart failure type [facility]) [I50.9] Patient Active Problem List   Diagnosis Date Noted   Acute respiratory failure (HCC) 03/28/2023   Acute on chronic respiratory failure (HCC) 03/28/2023   COPD with acute exacerbation (HCC) 03/01/2023    Essential hypertension 03/01/2023   Dyslipidemia 03/01/2023   GERD without esophagitis 03/01/2023   Peripheral arterial disease (HCC) 03/01/2023   History of DVT (deep vein thrombosis) 03/01/2023   COVID-19 virus infection 03/01/2023   Acute respiratory failure with hypoxia (HCC) 02/28/2023   Protein-calorie malnutrition, severe 01/31/2023   Cardiac arrest (HCC) 01/30/2023   Resistant hypertension 01/29/2023   Acute on chronic respiratory failure with hypoxia and hypercapnia (HCC) 01/26/2023   Acute respiratory failure with hypoxia and hypercapnia (HCC) 01/26/2023   Non-STEMI (non-ST elevated myocardial infarction) (HCC) 01/26/2023   History of acute anterior wall MI 01/26/2023   COPD, very severe (HCC) 01/26/2023   Cocaine abuse (HCC) 01/26/2023   Limb ischemia 06/20/2022   Hepatitis C virus infection 09/08/2021   Hyperkalemia 09/08/2021   Critical limb ischemia of left lower extremity (HCC) 09/07/2021   Critical ischemia of foot (HCC) 09/07/2021   AKI (acute kidney injury) (HCC) 09/07/2021   Protein-calorie malnutrition, severe 01/22/2020   Nontraumatic ischemic infarction of muscle, left lower leg 01/20/2020   Dehydration 01/20/2020   Arterial occlusion, lower extremity (HCC) 01/20/2020   Hypertension    Nicotine dependence    COPD (chronic obstructive pulmonary disease) with emphysema (HCC)    PAD (peripheral artery disease) (HCC)    Acute hypoxemic respiratory failure (HCC) 01/08/2018   Chronic diastolic CHF (congestive heart failure) (HCC) 06/21/2016   Acute right-sided heart failure (HCC) 06/21/2016   PCP:  [facility]:   [facility] [address] [place] - [address] [place] [address] Phone: [phone] Fax: [phone]  [facility] [address] [place] [address] Phone: [phone] Fax: [phone]  [facility] [place], [place] - [address]  [place] [address] Phone: [phone] Fax: [phone]     Social Drivers of Health (SDOH) Social History: SDOH Screenings   Food Insecurity: No Food Insecurity (03/28/2023)  Housing: Low Risk  (03/29/2023)  Transportation Needs: Unmet Transportation Needs (03/29/2023)  Utilities: Not At Risk (03/28/2023)  Financial Resource Strain: Low Risk  (03/29/2023)  Social Connections: Unknown (03/28/2023)  Tobacco Use: High Risk (03/29/2023)   SDOH Interventions: Housing Interventions: Intervention Not Indicated Transportation Interventions: Inpatient TOC (Pt was provided with  transportation resource handout) Financial Strain Interventions: Intervention Not Indicated   Readmission Risk Interventions    03/29/2023   12:29 PM 03/02/2023   10:50 AM  Readmission Risk Prevention Plan  Transportation Screening Complete Complete  Medication Review Oceanographer) Complete Complete  PCP or Specialist appointment within 3-5 days of discharge Complete Complete  SW Recovery Care/Counseling Consult Complete Complete  Palliative Care Screening Not Applicable Not Applicable  Skilled Nursing Facility Not Applicable Not Applicable

## 2023-03-29 NOTE — Assessment & Plan Note (Addendum)
 Trying to see if we can set up BiPAP at night.  With pCO2 greater than 100 likely would be a candidate.  Need to get an ABG.  Check pulse ox on room air with ambulation.

## 2023-03-29 NOTE — Assessment & Plan Note (Signed)
 Improved

## 2023-03-29 NOTE — Assessment & Plan Note (Signed)
 Patient on Eliquis and Plavix

## 2023-03-29 NOTE — Assessment & Plan Note (Signed)
 From prior IV site.  Cover with Betadine and cover with a large bandage.Marland Kitchen  Appreciate wound care nurse consultation.

## 2023-03-29 NOTE — Assessment & Plan Note (Signed)
 Continue Coreg.  Restarted lisinopril.  Aldactone started.  Also prescribed Jardiance.  Lasix only if weight gain or shortness of breath.

## 2023-03-29 NOTE — Assessment & Plan Note (Signed)
 Must stop using cocaine.

## 2023-03-29 NOTE — Assessment & Plan Note (Signed)
 Continue Coreg, restart lisinopril.  Start Aldactone tomorrow.  Last EF 35 to 40%.

## 2023-03-29 NOTE — Discharge Instructions (Signed)
 Transportation Resources  Agency Name: [facility] Agency Address: [address] [place], [place] [address] Phone: [phone] Email: [email or web address] Website: [email or web address] Service(s) Offered: Housing services, self-sufficiency, congregate meal program, weatherization program, Field seismologist program, emergency food assistance,  housing counseling, home ownership program, wheels-towork program.  Agency Name: [facility] [employer] [phone]) Address: [address], [place], [place] [address] Phone: [phone] Website: [email or web address] Service(s) Offered: Transportation for [employer], subscription and demand response; Dial-a-Ride for citizens 49 years of age or older.  Agency Name: Department of Social Services Address: [address] [place], [place] [address] Phone: [phone] Service(s) Offered: Child support services; child welfare services; food stamps; Medicaid; work first family assistance; and aid with fuel,  rent, food and medicine, transportation assistance.  Agency Name: Disabled [employer] (DAV) Transportation  Network Phone: [phone] Service(s) Offered: Transports veterans to the [facility] medical center. Call  forty-eight hours in advance and leave the name, telephone  number, date, and time of appointment. Veteran will be  contacted by the driver the day before the appointment to  arrange a pick up point   Transportation Resources  Agency Name: [facility] Agency Address: [address] [place], [place] [address] Phone: [phone] Email: [email or web address] Website: [email or web address] Service(s) Offered: Housing services, self-sufficiency, congregate meal program, weatherization program, Field seismologist program, emergency food assistance,  housing counseling, home ownership program, wheels-towork  program.  Agency Name: [facility] [employer] [phone]) Address: [address], [place], [place] [address] Phone: [phone] Website: [email or web address] Service(s) Offered: Transportation for [employer], subscription and demand response; Dial-a-Ride for citizens 26 years of age or older.  Agency Name: Department of Social Services Address: [address] [place], [place] [address] Phone: [phone] Service(s) Offered: Child support services; child welfare services; food stamps; Medicaid; work first family assistance; and aid with fuel,  rent, food and medicine, transportation assistance.  Agency Name: Disabled [employer] (DAV) Transportation  Network Phone: [phone] Service(s) Offered: Transports veterans to the [facility] medical center. Call  forty-eight hours in advance and leave the name, telephone  number, date, and time of appointment. Veteran will be  contacted by the driver the day before the appointment to  arrange a pick up point    [employer] ACTA currently provides door to door services. ACTA connects with PART daily for services to [facility]. ACTA also performs contract services to [employer] operates 27 vehicles, all but 3 mini-vans are equipped with lifts for special needs as well as the general public. ACTA drivers are each CDL certified and trained in First Aid and CPR. ACTA was established in 2002 by [employer]. An independent Industrial/product designer. ACTA operates via Cytogeneticist with required Research scientist (physical sciences) from [place]. ACTA provides over 80,000 passenger trips each year, including [facility] and [employer] sites.  Call at least by 11 AM one business day prior to needing transportation  [employer].                      [place], [place] [address]     Office  Hours: Monday-Friday  8 AM - 5 PM

## 2023-03-29 NOTE — Progress Notes (Signed)
 Heart Failure Stewardship Pharmacy Note  PCP: [facility] PCP-Cardiologist: Julien Nordmann, MD  HPI: Christopher Bryan. is a 63 y.o. male with COPD, hypertension and peripheral vascular disease, history of DVT, cardiac arrest 01/2023, CHF, cocaine use who presented with worsening dyspnea and productive cough over several days. BP on admission was severely elevated to 198/108 mmHg. In 01/2023, experienced a witnessed PEA cardiac arrest in the ED after 1 round of CPR converting to wide complex tachycardia appearing metabolic in nature and ROSC was achieved after calcium gluconate and bicarbonate. Pulses were lost again with ROSC after repeat administration of epinephrine, bicarbonate, and calcium. Recent hospitalization for COVID.  On admission, BNP was 507.8, HS-troponin was 23 > 25, lactic acid of 1.4 and positive for cocaine. Chest x-ray noted minimal.strandy and patchy opacities in the left lung base, atelectasis versus infiltrate with possible small pleural effusions.  Pertinent cardiac history: Echo in 05/2016 showed LVEF of 55-60%, mild concentric hypertrophy.  Echo in 12/2017 showed LVEF of 65-70%. Cardiac arrest in 01/2023. Echo 01/2023 showed LVEF reduced to 35-40%, normal RV, mild-moderate AR. Given elevated troponin and cardiac arrest, patient underwent cardiac cath showing mild, nonobstructive CAD with normal CO/CI. Cardiomyopathy likely secondary to cocaine use.   Pertinent Lab Values: Creatinine, Ser  Date Value Ref Range Status  03/29/2023 0.84 0.61 - 1.24 mg/dL Final   BUN  Date Value Ref Range Status  03/29/2023 20 8 - 23 mg/dL Final   Potassium  Date Value Ref Range Status  03/29/2023 4.2 3.5 - 5.1 mmol/L Final   Sodium  Date Value Ref Range Status  03/29/2023 137 135 - 145 mmol/L Final   B Natriuretic Peptide  Date Value Ref Range Status  03/28/2023 507.8 (H) 0.0 - 100.0 pg/mL Final    Comment:    Performed at [facility], [address] [place], [place] [address]   Magnesium  Date Value Ref Range Status  03/29/2023 2.0 1.7 - 2.4 mg/dL Final    Comment:    Performed at [facility], [address] [place], [place] [address]   Hgb A1c MFr Bld  Date Value Ref Range Status  03/01/2023 5.6 4.8 - 5.6 % Final    Comment:    (NOTE) Pre diabetes:          5.7%-6.4%  Diabetes:              >6.4%  Glycemic control for   <7.0% adults with diabetes    TSH  Date Value Ref Range Status  06/21/2016 0.316 (L) 0.350 - 4.500 uIU/mL Final    Comment:    Performed by a 3rd Generation assay with a functional sensitivity of <=0.01 uIU/mL.    Vital Signs: Temp:  [98 F (36.7 C)-98.6 F (37 C)] 98.6 F (37 C) (02/26 0800) Pulse Rate:  [67-89] 67 (02/26 0800) Cardiac Rhythm: Normal sinus rhythm (02/26 0737) Resp:  [9-19] 16 (02/26 0800) BP: (123-173)/(55-95) 155/69 (02/26 0800) SpO2:  [93 %-100 %] 99 % (02/26 0800) Weight:  [53.6 kg (118 lb 2.7 oz)] 53.6 kg (118 lb 2.7 oz) (02/26 0528)  Intake/Output Summary (Last 24 hours) at 03/29/2023 1048 Last data filed at 03/29/2023 1028 Gross per 24 hour  Intake 100 ml  Output --  Net 100 ml    Current Heart Failure Medications:  Loop diuretic: none scheduled, s/p IV furosemide 03/27/23 Beta-Blocker: none ACEI/ARB/ARNI: none MRA: none SGLT2i: none Other: none  Prior to admission Heart Failure Medications:  Loop diuretic: none Beta-Blocker: carvedilol 6.25 mg BID ACEI/ARB/ARNI: none MRA: none SGLT2i: none Other: none  Assessment: 1. Acute on chronic systolic heart failure (LVEF 35-40%)  , due to NICM, likely from cocaine use. NYHA class IV symptoms.  -Symptoms: Reports breathing is much improved today. Denies LEE and orthopnea. Appetite is good.  -Volume: Appears euvolemic today. No LEE noted. BNP was elevated on admission. Respiratory symptoms likely due to multiple processes. -Hemodynamics: BP is significantly elevated. HR stable in 60s. -BB: A  nonselective BB like carvedilol is not ideal given COPD, however, nonselective Bbs may be of concern given cocaine use. Carvedilol is safe if cocaine is used. Metoprolol has less data, however  what little is available suggests no harm. Can consider transition to metoprolol when patient has shown abstinence.  -ACEI/ARB/ARNI: Consider starting Entresto 24-26 mg twice daily today. BP remains high. -MRA: Can consider adding tomorrow pending labs. -SGLT2i: Consider starting Farxiga 10 mg daily today.  Plan: 1) Medication changes recommended at this time: - Consider starting Entresto 24-26 mg BID today -Consider starting Farxiga 10 mg daily today.  2) Patient assistance: -Patient receives medications through the [place] for no cost. -Can fill 30 day free prescriptions for branded medications at discharge for prompter access.  3) Education: - Patient has been educated on current HF medications and potential additions to HF medication regimen - Patient verbalizes understanding that over the next few months, these medication doses may change and more medications may be added to optimize HF regimen - Patient has been educated on basic disease state pathophysiology and goals of therapy  Medication Assistance / Insurance Benefits Check: Does the patient have prescription insurance?    Type of insurance plan:  Does the patient qualify for medication assistance through manufacturers or grants? No   [facility]      Please do not hesitate to reach out with questions or concerns,  Enos Fling, PharmD, CPP, BCPS Heart Failure Pharmacist  Phone - [phone] 03/29/2023 10:48 AM

## 2023-03-29 NOTE — Plan of Care (Signed)

## 2023-03-29 NOTE — Hospital Course (Signed)
 63 year old man past medical history of systolic congestive heart failure, atrial fibrillation, COPD, tobacco abuse, PAD, hypertension, history of DVT/PE, cocaine abuse.  He presents to the hospital with shortness of breath.  He is admitted with acute hypoxic hypercapnic respiratory failure secondary to COPD exacerbation and heart failure exacerbation.  Patient was started on Solu-Medrol and BiPAP.  2/26.  Patient feeling better than when he came in.  Has an area of large scab on his right forearm at a prior IV site.

## 2023-03-29 NOTE — Progress Notes (Signed)
 Patient continues to refuse ABG secondary to it being too painful. Morphine will be given prn and will attempt abg again

## 2023-03-29 NOTE — Progress Notes (Signed)
 Progress Note   Patient: Christopher Bryan. XLK:[id number] DOB: [birth year] DOA: 03/27/2023     1 DOS: the patient was seen and examined on 03/29/2023   Brief hospital course: 63 year old man past medical history of systolic congestive heart failure, atrial fibrillation, COPD, tobacco abuse, PAD, hypertension, history of DVT/PE, cocaine abuse.  He presents to the hospital with shortness of breath.  He is admitted with acute hypoxic hypercapnic respiratory failure secondary to COPD exacerbation and heart failure exacerbation.  Patient was started on Solu-Medrol and BiPAP.  2/26.  Patient feeling better than when he came in.  Has an area of large scab on his right forearm at a prior IV site.  Assessment and Plan: * Acute respiratory failure with hypoxia and hypercapnia (HCC) Trying to see if we can set up BiPAP at night.  With pCO2 greater than 100 likely would be a candidate.  Need to get an ABG.  Check pulse ox on room air with ambulation.  COPD exacerbation (HCC) Patient on Solu-Medrol Rocephin and Zithromax.  Possible pneumonia but procalcitonin was negative.  Short course of antibiotics.  Elevated liver function tests Improved  Arm wound, right, sequela From prior IV site.  Comfort with Betadine.  Appreciate wound care nurse consultation.  Acute on chronic systolic CHF (congestive heart failure) (HCC) Continue Coreg, restart lisinopril.  Start Aldactone tomorrow.  Last EF 35 to 40%.  Essential hypertension Continue Coreg.  Restart lisinopril.  Aldactone for tomorrow.  Cocaine abuse (HCC) Must stop using cocaine.  PAD (peripheral artery disease) (HCC) Patient on Eliquis and Plavix        Subjective: Patient feeling better than when he came in.  Required BiPAP on presentation with a pCO2 greater than 100.  Stated he used cocaine about 3 weeks ago.  No complaints of chest pain.  Physical Exam: Vitals:   03/29/23 0729 03/29/23 0800 03/29/23 1102 03/29/23 1322  BP:  (!)  155/69 (!) 168/93   Pulse:  67 86   Resp:  16 18   Temp:  98.6 F (37 C) 98.1 F (36.7 C)   TempSrc:  Oral    SpO2: 98% 99% 92% 95%  Weight:      Height:       Physical Exam HENT:     Head: Normocephalic.     Mouth/Throat:     Pharynx: No oropharyngeal exudate.  Eyes:     General: Lids are normal.     Conjunctiva/sclera: Conjunctivae normal.  Cardiovascular:     Rate and Rhythm: Normal rate and regular rhythm.     Heart sounds: Normal heart sounds, S1 normal and S2 normal.  Pulmonary:     Breath sounds: No decreased breath sounds, wheezing, rhonchi or rales.  Abdominal:     Palpations: Abdomen is soft.     Tenderness: There is no abdominal tenderness.  Musculoskeletal:     Right lower leg: No swelling.     Left lower leg: No swelling.  Skin:    General: Skin is warm.     Comments: Large necrotic scab right arm.  Neurological:     Mental Status: He is alert and oriented to person, place, and time.     Data Reviewed: White blood count 5.4, hemoglobin 11.2, platelet count 151, procalcitonin 0.32 creatinine 0.84 Venous blood gas pH 7.08, pCO2 102, pO2 of less than 31  Disposition: Status is: Inpatient Remains inpatient appropriate because: Trying to see if we can set the patient up for noninvasive ventilation at night  Planned Discharge Destination: Home    Time spent: 29 minutes  Author: Alford Highland, MD 03/29/2023 3:12 PM  For on call review [email or web address].

## 2023-03-29 NOTE — Consult Note (Signed)
 WOC Nurse Consult Note: Reason for Consult: right forearm wound. Patient with history of IV infiltrate in this arm.  Presents from home with SOB. History COPD/CHF Wound type: full thickness wound right forearm Pressure Injury POA: NA Measurement: see nursing flow sheets Wound bed:100% eschar  Drainage (amount, consistency, odor) none Periwound: intact  Dressing procedure/placement/frequency: Paint arm wound with betadine daily, allow to air dry. Top with foam if patient desires to protect  Discussed POC with patient and bedside nurse.  Re consult if needed, will not follow at this time. Thanks  Idrissa Beville [employer], RN,CWOCN, CNS, CWON-AP [phone])

## 2023-03-29 NOTE — Progress Notes (Signed)
 Heart Failure Nurse Navigator Progress Note  PCP: [facility] PCP-Cardiologist: Julien Nordmann, MD Admission Diagnosis:  Acute on chronic congestive heart failure, unspecified heart failure type (HCC) COPD exacerbation (HCC) Hypertensive emergency Pneumonia of left lung due to infectious organism, unspecified part of lung Acute respiratory failure with hypoxia and hypercapnia [facility]  Admitted from: Home via [facility]   Presentation:   Christopher Bryan. presented to the ED with respiratory distress and was tripoding.  Sats on room air  78% and wheezing throughout.  History of smoking and COPD.  Placed on CPAP and BNP 507.8, HS-troponin was 23>25. Positive for cocaine.CXR-minimal strandy and patchy opacities in the left lung base, atelectasis versus infiltrate with possible small pleural effusions.   ECHO/ LVEF: 01/2023 (35-40%)  Clinical Course:  Past Medical History:  Diagnosis Date   A-fib (HCC)    CHF (congestive heart failure) (HCC)    COPD (chronic obstructive pulmonary disease) (HCC)    DVT (deep venous thrombosis) (HCC)    Hypertension    Peripheral arterial disease (HCC)    Right-sided heart failure (HCC)    Smoker      Social History   Socioeconomic History   Marital status: Single    Spouse name: Not on file   Number of children: 2   Years of education: Not on file   Highest education level: 10th grade  Occupational History   Occupation: Retired  Tobacco Use   Smoking status: Former    Current packs/day: 0.00    Average packs/day: 0.5 packs/day for 40.0 years (20.0 ttl pk-yrs)    Types: Cigarettes    Quit date: 02/25/2023    Years since quitting: 0.0   Smokeless tobacco: Never  Vaping Use   Vaping status: Former  Substance and Sexual Activity   Alcohol use: Not Currently    Comment: 0   Drug use: Yes    Frequency: 1.0 times per week    Types: "Crack" cocaine, Marijuana    Comment: January 2025   Sexual activity: Not Currently  Other  Topics Concern   Not on file  Social History Narrative   ** Merged History Encounter **       Social Drivers of Health   Financial Resource Strain: Low Risk  (03/29/2023)   Overall Financial Resource Strain (CARDIA)    Difficulty of Paying Living Expenses: Not hard at all  Food Insecurity: No Food Insecurity (03/28/2023)   Hunger Vital Sign    Worried About Running Out of Food in the Last Year: Never true    Ran Out of Food in the Last Year: Never true  Transportation Needs: Unmet Transportation Needs (03/29/2023)   PRAPARE - Administrator, Civil Service (Medical): Yes    Lack of Transportation (Non-Medical): Yes  Physical Activity: Not on file  Stress: Not on file  Social Connections: Unknown (03/28/2023)   Social Connection and Isolation Panel [NHANES]    Frequency of Communication with Friends and Family: Twice a week    Frequency of Social Gatherings with Friends and Family: Once a week    Attends Religious Services: 1 to 4 times per year    Active Member of [employer] or Organizations: No    Attends Engineer, structural: Never    Marital Status: Patient declined   Water engineer and Provision:  Detailed education and instructions provided on heart failure disease management including the following:  Signs and symptoms of Heart Failure When to call the physician Importance  of daily weights Low sodium diet Fluid restriction Medication management Anticipated future follow-up appointments  Patient education given on each of the above topics.  Patient acknowledges understanding via teach back method and acceptance of all instructions.  Education Materials:  "Living Better With Heart Failure" Booklet, HF zone tool, & Daily Weight Tracker Tool.  Patient has scale at home: Yes Patient has pill box at home: Yes    High Risk Criteria for Readmission and/or Poor Patient Outcomes: Heart failure hospital admissions (last 6 months): 1  No Show rate:  20% Difficult social situation: Lives in a boarding house. Hx:of cocaine use and tobacco use. Anxiety. Demonstrates medication adherence: Yes Primary Language: English Literacy level: Reading, Writing & Comprehension  Barriers of Care:   Transportation- is [facility] but also has Engineer, drilling [employer] Dual complete.  Pt was provided with the transportation resource handout and number to call to get transportation set up. Will check back with patient prior to his discharge to make sure he has set this up for [facility] appointments and future appointments.   Considerations/Referrals:   Referral made to Heart Failure Pharmacist Stewardship: Yes Referral made to Heart Failure CSW/NCM TOC: No Referral made to Heart & Vascular TOC clinic: Yes 04/04/23 @ 9:30 AM  Items for Follow-up on DC/TOC: Diet & Fluid Restrictions Daily weights-patient has a scale but admits to not weighing everyday currently. Medication compliance-make sure he is getting his medications at his TOC visit. Smoking Cessation & Drug use   Continued Heart Failure Education   Roxy Horseman, RN, BSN [facility] Heart Failure Navigator Secure Chat Only

## 2023-03-29 NOTE — Plan of Care (Signed)

## 2023-03-29 NOTE — Assessment & Plan Note (Addendum)
 Possible pneumonia left lower lobe.  Patient given Rocephin, Zithromax and Solu-Medrol while here.  Will switch over to prednisone Augmentin and Zithromax upon discharge.

## 2023-03-30 ENCOUNTER — Other Ambulatory Visit: Payer: Self-pay

## 2023-03-30 ENCOUNTER — Telehealth (HOSPITAL_COMMUNITY): Payer: Self-pay | Admitting: Pharmacy Technician

## 2023-03-30 ENCOUNTER — Other Ambulatory Visit (HOSPITAL_COMMUNITY): Payer: Self-pay

## 2023-03-30 DIAGNOSIS — J189 Pneumonia, unspecified organism: Secondary | ICD-10-CM

## 2023-03-30 DIAGNOSIS — I5023 Acute on chronic systolic (congestive) heart failure: Secondary | ICD-10-CM | POA: Diagnosis not present

## 2023-03-30 DIAGNOSIS — J9601 Acute respiratory failure with hypoxia: Secondary | ICD-10-CM | POA: Diagnosis not present

## 2023-03-30 DIAGNOSIS — J441 Chronic obstructive pulmonary disease with (acute) exacerbation: Secondary | ICD-10-CM | POA: Diagnosis not present

## 2023-03-30 DIAGNOSIS — I10 Essential (primary) hypertension: Secondary | ICD-10-CM

## 2023-03-30 DIAGNOSIS — R7989 Other specified abnormal findings of blood chemistry: Secondary | ICD-10-CM

## 2023-03-30 LAB — BLOOD GAS, VENOUS
Bicarbonate: 30.2 mmol/L — ABNORMAL HIGH (ref 20.0–28.0)
O2 Saturation: 26.3 mmol/L — ABNORMAL HIGH (ref 0.0–2.0)
Patient temperature: 37
Patient temperature: 37 %
pCO2, Ven: 102 mmHg (ref 44–60)
pH, Ven: 7.08 — CL (ref 7.25–7.43)
pO2, Ven: <31 mmol/L — CL (ref 32–45)

## 2023-03-30 LAB — GLUCOSE, CAPILLARY
Glucose-Capillary: 155 mg/dL — ABNORMAL HIGH (ref 70–99)
Glucose-Capillary: 129 mg/dL — ABNORMAL HIGH (ref 70–99)

## 2023-03-30 MED ORDER — LISINOPRIL 20 MG PO TABS
20.0000 mg | ORAL_TABLET | Freq: Every day | ORAL | 0 refills | Status: DC
Start: 2023-03-31 — End: 2023-05-01
  Filled 2023-03-30: qty 30, 30d supply, fill #0

## 2023-03-30 MED ORDER — DAPAGLIFLOZIN PROPANEDIOL 10 MG PO TABS
10.0000 mg | ORAL_TABLET | Freq: Every day | ORAL | 0 refills | Status: DC
  Filled 2023-03-30: qty 30, 30d supply, fill #0

## 2023-03-30 MED ORDER — FUROSEMIDE 20 MG PO TABS
20.0000 mg | ORAL_TABLET | Freq: Every day | ORAL | 0 refills | Status: AC | PRN
  Filled 2023-03-30: qty 30, 30d supply, fill #0

## 2023-03-30 MED ORDER — AMOXICILLIN-POT CLAVULANATE 875-125 MG PO TABS
1.0000 | ORAL_TABLET | Freq: Two times a day (BID) | ORAL | 0 refills | Status: AC
  Filled 2023-03-30: qty 4, 2d supply, fill #0

## 2023-03-30 MED ORDER — FUROSEMIDE 40 MG PO TABS: 40.0000 mg | ORAL_TABLET | Freq: Every day | ORAL | Status: DC

## 2023-03-30 MED ORDER — DAPAGLIFLOZIN PROPANEDIOL 10 MG PO TABS
10.0000 mg | ORAL_TABLET | Freq: Every day | ORAL | Status: DC
  Administered 2023-03-30: 10 mg via ORAL
  Filled 2023-03-30: qty 1

## 2023-03-30 MED ORDER — POLYETHYLENE GLYCOL 3350 17 GM/SCOOP PO POWD
17.0000 g | Freq: Every day | ORAL | 1 refills | Status: AC | PRN
  Filled 2023-03-30: qty 510, 30d supply, fill #0

## 2023-03-30 MED ORDER — FUROSEMIDE 20 MG PO TABS: 20.0000 mg | ORAL_TABLET | Freq: Every day | ORAL | Status: DC | PRN

## 2023-03-30 MED ORDER — PREDNISONE 10 MG PO TABS
40.0000 mg | ORAL_TABLET | Freq: Every day | ORAL | 0 refills | Status: DC
  Filled 2023-03-30: qty 8, 2d supply, fill #0

## 2023-03-30 MED ORDER — SPIRONOLACTONE 25 MG PO TABS
12.5000 mg | ORAL_TABLET | Freq: Every day | ORAL | 0 refills | Status: AC
  Filled 2023-03-30: qty 30, 60d supply, fill #0

## 2023-03-30 MED ORDER — EMPAGLIFLOZIN 10 MG PO TABS
10.0000 mg | ORAL_TABLET | Freq: Every day | ORAL | 0 refills | Status: AC
  Filled 2023-03-30: qty 30, 30d supply, fill #0

## 2023-03-30 MED ORDER — AZITHROMYCIN 250 MG PO TABS
250.0000 mg | ORAL_TABLET | Freq: Every day | ORAL | 0 refills | Status: DC
  Filled 2023-03-30: qty 2, 2d supply, fill #0

## 2023-03-30 NOTE — Plan of Care (Signed)

## 2023-03-30 NOTE — Progress Notes (Signed)
 Heart Failure Stewardship Pharmacy Note  [facility] PCP-Cardiologist: Julien Nordmann, MD  HPI: Christopher Shonka. is a 63 y.o. male with COPD, hypertension and peripheral vascular disease, history of DVT, cardiac arrest 01/2023, CHF, cocaine use who presented with worsening dyspnea and productive cough over several days. BP on admission was severely elevated to 198/108 mmHg. In 01/2023, experienced a witnessed PEA cardiac arrest in the ED after 1 round of CPR converting to wide complex tachycardia appearing metabolic in nature and ROSC was achieved after calcium gluconate and bicarbonate. Pulses were lost again with ROSC after repeat administration of epinephrine, bicarbonate, and calcium. Recent hospitalization for COVID.  On admission, BNP was 507.8, HS-troponin was 23 > 25, lactic acid of 1.4 and positive for cocaine. Chest x-ray noted minimal.strandy and patchy opacities in the left lung base, atelectasis versus infiltrate with possible small pleural effusions.  Pertinent cardiac history: Echo in 05/2016 showed LVEF of 55-60%, mild concentric hypertrophy.  Echo in 12/2017 showed LVEF of 65-70%. Cardiac arrest in 01/2023. Echo 01/2023 showed LVEF reduced to 35-40%, normal RV, mild-moderate AR. Given elevated troponin and cardiac arrest, patient underwent cardiac cath showing mild, nonobstructive CAD with normal CO/CI. Cardiomyopathy likely secondary to cocaine use.   Pertinent Lab Values: Creatinine, Ser  Date Value Ref Range Status  03/29/2023 0.84 0.61 - 1.24 mg/dL Final   BUN  Date Value Ref Range Status  03/29/2023 20 8 - 23 mg/dL Final   Potassium  Date Value Ref Range Status  03/29/2023 4.2 3.5 - 5.1 mmol/L Final   Sodium  Date Value Ref Range Status  03/29/2023 137 135 - 145 mmol/L Final   B Natriuretic Peptide  Date Value Ref Range Status  03/28/2023 507.8 (H) 0.0 - 100.0 pg/mL Final    Comment:    Performed at [facility], [address] [place], [place] [address]   Magnesium  Date Value Ref Range Status  03/29/2023 2.0 1.7 - 2.4 mg/dL Final    Comment:    Performed at [facility], [address] [place], [place] [address]   Hgb A1c MFr Bld  Date Value Ref Range Status  03/01/2023 5.6 4.8 - 5.6 % Final    Comment:    (NOTE) Pre diabetes:          5.7%-6.4%  Diabetes:              >6.4%  Glycemic control for   <7.0% adults with diabetes    TSH  Date Value Ref Range Status  06/21/2016 0.316 (L) 0.350 - 4.500 uIU/mL Final    Comment:    Performed by a 3rd Generation assay with a functional sensitivity of <=0.01 uIU/mL.    Vital Signs: Temp:  [97.6 F (36.4 C)-98.7 F (37.1 C)] 98.1 F (36.7 C) (02/27 0828) Pulse Rate:  [68-86] 72 (02/27 0828) Cardiac Rhythm: Normal sinus rhythm (02/26 2136) Resp:  [16-20] 16 (02/27 0828) BP: (152-168)/(73-93) 153/85 (02/27 0828) SpO2:  [91 %-95 %] 94 % (02/27 0828) Weight:  [54.3 kg (119 lb 11.4 oz)] 54.3 kg (119 lb 11.4 oz) (02/27 0428)  Intake/Output Summary (Last 24 hours) at 03/30/2023 0829 Last data filed at 03/30/2023 0329 Gross per 24 hour  Intake 200 ml  Output --  Net 200 ml    Current Heart Failure Medications:  Loop diuretic: none  Beta-Blocker: carvedilol 6.25 mg BID ACEI/ARB/ARNI: lisinopril 20 mg daily MRA: spironolactone 12.5 mg daily SGLT2i: none Other: none  Prior to  admission Heart Failure Medications:  Loop diuretic: none Beta-Blocker: carvedilol 6.25 mg BID ACEI/ARB/ARNI: none MRA: none SGLT2i: none Other: none  Assessment: 1. Acute on chronic systolic heart failure (LVEF 35-40%)  , due to NICM, likely from cocaine use. NYHA class IV symptoms.  -Symptoms: Reports breathing is much improved today. Denies LEE and orthopnea. Appetite is good.  -Volume: Appears euvolemic. No LEE noted. BNP was elevated on admission. Respiratory symptoms likely due to multiple processes. Suspect new start spironolactone and Marcelline Deist would be  enough to maintain euvolemia.  -Hemodynamics: BP is significantly elevated. HR stable in 60-80s. -BB: A nonselective BB like carvedilol is not ideal given COPD, however, nonselective BBs may be of concern given cocaine use. Carvedilol is safe if cocaine is used. Metoprolol has less data, however, what little is available suggests no harm. Can consider transition to metoprolol/bisoprolol when patient has shown abstinence from cocaine.  -ACEI/ARB/ARNI: Lisinopril restarted yesterday. Would benefit from transition to [facility], however would require 36 hour washout. Can consider starting [facility] outpatient. -MRA: Spironolactone 12.5 mg daily starting this morning -SGLT2i: Farxiga 10 mg daily starting this morning.  Plan: 1) Medication changes recommended at this time: -Discussed with MD. Starting spironolactone 12.5 mg daily and Farxiga 10 mg daily for GDMT and to maintain euvolemia.  2) Patient assistance: -Patient receives medications through the [place] for no cost. -Can fill 30 day free prescriptions for branded medications at discharge for prompter access.  3) Education: - Patient has been educated on current HF medications and potential additions to HF medication regimen - Patient verbalizes understanding that over the next few months, these medication doses may change and more medications may be added to optimize HF regimen - Patient has been educated on basic disease state pathophysiology and goals of therapy  Medication Assistance / Insurance Benefits Check: Does the patient have prescription insurance?    Type of insurance plan:  Does the patient qualify for medication assistance through manufacturers or grants? No   [facility]      Please do not hesitate to reach out with questions or concerns,  Enos Fling, PharmD, CPP, BCPS Heart Failure Pharmacist  Phone - [phone] 03/30/2023 8:29 AM

## 2023-03-30 NOTE — Telephone Encounter (Signed)
 Patient Product/process development scientist completed.    The patient is insured through [facility]. Patient has Medicare and is not eligible for a copay card, but may be able to apply for patient assistance or Medicare RX Payment Plan (Patient Must reach out to their plan, if eligible for payment plan), if available.    Ran test claim for Jardiance 10 mg and the current 30 day co-pay is $0.00.   This test claim was processed through [facility]- copay amounts may vary at other pharmacies due to pharmacy/plan contracts, or as the patient moves through the different stages of their insurance plan.     Roland Earl, CPHT Pharmacy Technician III Certified Patient Advocate [facility] Pharmacy Patient Advocate Team Direct Number: [phone]  Fax: [phone]

## 2023-03-30 NOTE — Discharge Summary (Signed)
 Physician Discharge Summary   Patient: Christopher Bryan. MRN: [id number] DOB: [birth year]  Admit date:     03/27/2023  Discharge date: 03/30/23  Discharge Physician: Alford Highland   PCP: [facility]   Recommendations at discharge:   Follow-up at the [facility] Follow-up CHF clinic  Discharge Diagnoses: Principal Problem:   Acute respiratory failure with hypoxia and hypercapnia (HCC) Active Problems:   COPD exacerbation (HCC)   Acute on chronic systolic CHF (congestive heart failure) (HCC)   PAD (peripheral artery disease) (HCC)   Cocaine abuse (HCC)   Essential hypertension   Arm wound, right, sequela   Elevated liver function tests   Pneumonia of left lung due to infectious organism    Hospital Course: 63 year old man past medical history of systolic congestive heart failure, atrial fibrillation, COPD, tobacco abuse, PAD, hypertension, history of DVT/PE, cocaine abuse.  He presents to the hospital with shortness of breath.  He is admitted with acute hypoxic hypercapnic respiratory failure secondary to COPD exacerbation and heart failure exacerbation.  Patient was started on Solu-Medrol and BiPAP.  2/26.  Patient feeling better than when he came in.  Has an area of large scab on his right forearm at a prior IV site.  ABG shows a pH of 7.43, pCO2 44, pO2 65 O2 saturation 95%. 2/27.  Patient feeling a lot better.  Stable for discharge home.  Assessment and Plan: * Acute respiratory failure with hypoxia and hypercapnia (HCC) Patient able to come off oxygen during the hospital course.  Repeat ABG does not show CO2 retention.  Will not be able to set up BiPAP at home.  Acute on chronic systolic CHF (congestive heart failure) (HCC) Continue Coreg, lisinopril, Aldactone and Jardiance.  Last EF 35 to 40%.  Lasix only as needed for shortness of breath or weight gain.  COPD exacerbation (HCC) Possible pneumonia left lower lobe.  Patient given Rocephin, Zithromax and  Solu-Medrol while here.  Will switch over to prednisone Augmentin and Zithromax upon discharge.  PAD (peripheral artery disease) (HCC) Patient on Eliquis and Plavix  Cocaine abuse (HCC) Must stop using cocaine.  Essential hypertension Continue Coreg.  Restarted lisinopril.  Aldactone started.  Also prescribed Jardiance.  Lasix only if weight gain or shortness of breath.  Arm wound, right, sequela From prior IV site.  Cover with Betadine and cover with a large bandage.Marland Kitchen  Appreciate wound care nurse consultation.  Elevated liver function tests Improved         Consultants: Wound care nurse Procedures performed: None Disposition: Home Diet recommendation:  Cardiac diet DISCHARGE MEDICATION: Allergies as of 03/30/2023       Reactions   Iodine Rash   Ivp Dye [iodinated Contrast Media] Hives   Latex Hives, Itching   Iodinated Contrast Media Hives, Rash   Other Itching, Rash   [place] Meat allergy per patient        Medication List     TAKE these medications    albuterol 108 (90 Base) MCG/ACT inhaler Commonly known as: VENTOLIN HFA Inhale 2 puffs into the lungs every 6 (six) hours as needed for wheezing or shortness of breath.   amoxicillin-clavulanate 875-125 MG tablet Commonly known as: AUGMENTIN Take 1 tablet by mouth 2 (two) times daily for 4 doses. Start taking on: March 31, 2023   apixaban 5 MG Tabs tablet Commonly known as: ELIQUIS Take 1 tablet (5 mg total) by mouth 2 (two) times daily.   atorvastatin 40 MG tablet Commonly known as: LIPITOR  Take 20 mg by mouth at bedtime.   azithromycin 250 MG tablet Commonly known as: ZITHROMAX Take 1 tablet (250 mg total) by mouth daily for 2 days. Start taking on: March 31, 2023   carboxymethylcellulose 0.5 % Soln Commonly known as: REFRESH PLUS Place 1 drop into both eyes 4 (four) times daily as needed.   carvedilol 6.25 MG tablet Commonly known as: COREG Take 6.25 mg by mouth 2 (two) times daily  with a meal.   cholecalciferol 1000 units tablet Commonly known as: VITAMIN D Take 2,000 Units by mouth daily.   clopidogrel 75 MG tablet Commonly known as: PLAVIX Take 1 tablet (75 mg total) by mouth daily.   diclofenac Sodium 1 % Gel Commonly known as: VOLTAREN Apply 2 g topically 3 (three) times daily as needed.   empagliflozin 10 MG Tabs tablet Commonly known as: Jardiance Take 1 tablet (10 mg total) by mouth daily before breakfast.   fluticasone-salmeterol 250-50 MCG/ACT Aepb Commonly known as: ADVAIR Inhale 1 puff into the lungs in the morning and at bedtime.   furosemide 20 MG tablet Commonly known as: LASIX Take 1 tablet (20 mg total) by mouth daily as needed for edema (weight gain of 3 lbs in a day or 5 lbs in a week).   gabapentin 300 MG capsule Commonly known as: NEURONTIN Take 600 mg by mouth 3 (three) times daily. TAKE TWO CAPSULES BY MOUTH THREE TIMES A DAY *START WITH 1 CAPSULE THREE TIMES A DAY AND INCREASE BY 1 CAPSULE EVERY 3 DAYS UNTIL TAKING TWO CAPSULES THREE TIMES A DAY   lidocaine 5 % Commonly known as: LIDODERM Place 1 patch onto the skin daily. Remove & Discard patch within 12 hours or as directed by MD   lisinopril 20 MG tablet Commonly known as: ZESTRIL Take 1 tablet (20 mg total) by mouth daily. Start taking on: March 31, 2023   melatonin 3 MG Tabs tablet Take 3 mg by mouth at bedtime.   methocarbamol 500 MG tablet Commonly known as: ROBAXIN Take 500 mg by mouth every 8 (eight) hours as needed for muscle spasms.   mirtazapine 7.5 MG tablet Commonly known as: REMERON Take 7.5 mg by mouth at bedtime.   nicotine polacrilex 4 MG lozenge Commonly known as: COMMIT Take 4 mg by mouth as needed. DISSOLVE 1 LOZENGE BY MOUTH AS DIRECTED FOR SMOKING CESSATION USE AT LEAST 8-10 LOZENGES EACH DAY TO START. DO NOT USE MORE THAN 20 LOZENGES IN A DAY. DO NOT EAT OR DRINK FOR 15 MINUTES BEFORE AND DURING USE   pantoprazole 20 MG tablet Commonly  known as: PROTONIX Take 20 mg by mouth daily.   polyethylene glycol powder 17 GM/SCOOP powder Commonly known as: GLYCOLAX/MIRALAX Take 17 g by mouth daily as needed for moderate constipation.   predniSONE 10 MG tablet Commonly known as: DELTASONE Take 4 tablets (40 mg total) by mouth daily for 2 more days. Start taking on: March 31, 2023   sennosides-docusate sodium 8.6-50 MG tablet Commonly known as: SENOKOT-S Take 1-2 tablets by mouth at bedtime as needed for constipation.   sildenafil 100 MG tablet Commonly known as: VIAGRA Take 100 mg by mouth daily as needed for erectile dysfunction.   spironolactone 25 MG tablet Commonly known as: ALDACTONE Take 0.5 tablets (12.5 mg total) by mouth daily. Start taking on: March 31, 2023   tiotropium 18 MCG inhalation capsule Commonly known as: SPIRIVA Place 18 mcg into inhaler and inhale daily.  Follow-up Information     [facility]. Go on 04/04/2023.   Specialty: Cardiology Why: Hospital Follow-Up 04/04/23 @ 9:30 AM Please bring all medications to follow-up appointment [facility] Contact information: [address] [place] [address] [phone]        [facility] Follow up in 1 week(s).   Specialty: General Practice Why: Phone appointment made for 04/06/2023 @ 3PM. He will be speaking with Dr.Bryan to see if he needs to come in to be seen. Contact information: [address] [place] [address] [phone]                Discharge Exam: Filed Weights   03/28/23 0006 03/29/23 0528 03/30/23 0428  Weight: 56.7 kg 53.6 kg 54.3 kg   Physical Exam HENT:     Head: Normocephalic.     Mouth/Throat:     Pharynx: No oropharyngeal exudate.  Eyes:     General: Lids are normal.     Conjunctiva/sclera: Conjunctivae normal.  Cardiovascular:     Rate and Rhythm: Normal rate and regular  rhythm.     Heart sounds: Normal heart sounds, S1 normal and S2 normal.  Pulmonary:     Breath sounds: No decreased breath sounds, wheezing, rhonchi or rales.  Abdominal:     Palpations: Abdomen is soft.     Tenderness: There is no abdominal tenderness.  Musculoskeletal:     Right lower leg: No swelling.     Left lower leg: No swelling.  Skin:    General: Skin is warm.     Comments: Large necrotic scab right arm.  Neurological:     Mental Status: He is alert and oriented to person, place, and time.      Condition at discharge: stable  The results of significant diagnostics from this hospitalization (including imaging, microbiology, ancillary and laboratory) are listed below for reference.   Imaging Studies: DG Chest Portable 1 View Result Date: 03/28/2023 CLINICAL DATA:  Shortness of breath EXAM: PORTABLE CHEST 1 VIEW COMPARISON:  Chest x-ray 02/28/2023 FINDINGS: The lungs are hyperinflated, unchanged. There some minimal strandy and patchy opacities in the left lung base. There is blunting of the costophrenic angles. There is no pneumothorax. The cardiomediastinal silhouette appears within normal limits. No acute fractures are seen. IMPRESSION: 1. Minimal strandy and patchy opacities in the left lung base, atelectasis versus infiltrate. 2. Blunting of the costophrenic angles, which may represent small pleural effusions. Electronically Signed   By: Darliss Cheney M.D.   On: 03/28/2023 01:17   DG Chest 2 View Result Date: 02/28/2023 CLINICAL DATA:  Shortness of breath EXAM: CHEST - 2 VIEW COMPARISON:  X-ray 01/26/2023. FINDINGS: Hyperinflation. No consolidation, pneumothorax or effusion. No edema. Normal cardiopericardial silhouette. Calcified aorta. IMPRESSION: Hyperinflation.  No acute cardiopulmonary disease. Electronically Signed   By: Karen Kays M.D.   On: 02/28/2023 18:16    Microbiology: Results for orders placed or performed during the hospital encounter of 03/27/23  Resp panel  by RT-PCR (RSV, Flu A&B, Covid) Anterior Nasal Swab     Status: None   Collection Time: 03/28/23 12:14 AM   Specimen: Anterior Nasal Swab  Result Value Ref Range Status   SARS Coronavirus 2 by RT PCR NEGATIVE NEGATIVE Final    Comment: (NOTE) SARS-CoV-2 target nucleic acids are NOT DETECTED.  The SARS-CoV-2 RNA is generally detectable in upper respiratory specimens during the acute phase of infection. The  lowest concentration of SARS-CoV-2 viral copies this assay can detect is 138 copies/mL. A negative result does not preclude SARS-Cov-2 infection and should not be used as the sole basis for treatment or other patient management decisions. A negative result may occur with  improper specimen collection/handling, submission of specimen other than nasopharyngeal swab, presence of viral mutation(s) within the areas targeted by this assay, and inadequate number of viral copies(<138 copies/mL). A negative result must be combined with clinical observations, patient history, and epidemiological information. The expected result is Negative.  Fact Sheet for Patients:  BloggerCourse.com  Fact Sheet for Healthcare Providers:  SeriousBroker.it  This test is no t yet approved or cleared by the [place] FDA and  has been authorized for detection and/or diagnosis of SARS-CoV-2 by FDA under an Emergency Use Authorization (EUA). This EUA will remain  in effect (meaning this test can be used) for the duration of the COVID-19 declaration under Section 564(b)(1) of the Act, 21 U.S.C.section 360bbb-3(b)(1), unless the authorization is terminated  or revoked sooner.       Influenza A by PCR NEGATIVE NEGATIVE Final   Influenza B by PCR NEGATIVE NEGATIVE Final    Comment: (NOTE) The Xpert Xpress SARS-CoV-2/FLU/RSV plus assay is intended as an aid in the diagnosis of influenza from Nasopharyngeal swab specimens and should not be used as a sole  basis for treatment. Nasal washings and aspirates are unacceptable for Xpert Xpress SARS-CoV-2/FLU/RSV testing.  Fact Sheet for Patients: BloggerCourse.com  Fact Sheet for Healthcare Providers: SeriousBroker.it  This test is not yet approved or cleared by the [place] FDA and has been authorized for detection and/or diagnosis of SARS-CoV-2 by FDA under an Emergency Use Authorization (EUA). This EUA will remain in effect (meaning this test can be used) for the duration of the COVID-19 declaration under Section 564(b)(1) of the Act, 21 U.S.C. section 360bbb-3(b)(1), unless the authorization is terminated or revoked.     Resp Syncytial Virus by PCR NEGATIVE NEGATIVE Final    Comment: (NOTE) Fact Sheet for Patients: BloggerCourse.com  Fact Sheet for Healthcare Providers: SeriousBroker.it  This test is not yet approved or cleared by the [place] FDA and has been authorized for detection and/or diagnosis of SARS-CoV-2 by FDA under an Emergency Use Authorization (EUA). This EUA will remain in effect (meaning this test can be used) for the duration of the COVID-19 declaration under Section 564(b)(1) of the Act, 21 U.S.C. section 360bbb-3(b)(1), unless the authorization is terminated or revoked.  Performed at [facility], [address] [place], [place] [address]   MRSA Next Gen by PCR, Nasal     Status: None   Collection Time: 03/29/23  3:35 PM   Specimen: Nasal Mucosa; Nasal Swab  Result Value Ref Range Status   MRSA by PCR Next Gen NOT DETECTED NOT DETECTED Final    Comment: (NOTE) The GeneXpert MRSA Assay (FDA approved for NASAL specimens only), is one component of a comprehensive MRSA colonization surveillance program. It is not intended to diagnose MRSA infection nor to guide or monitor treatment for MRSA infections. Test performance is not FDA approved  in patients less than 76 years old. Performed at [facility], [address] [place], [place] [address]     Labs: CBC: Recent Labs  Lab 03/28/23 0014 03/28/23 0406 03/29/23 0416  WBC 9.7 7.2 5.4  NEUTROABS 1.1*  --   --   HGB 12.9* 12.2* 11.2*  HCT 41.6 38.4* 34.1*  MCV 91.6 88.9 85.0  PLT 205 182 151   Basic Metabolic Panel: Recent Labs  Lab 03/28/23 0014 03/28/23 0406 03/29/23 0416  NA 144 141 137  K 3.9 4.5 4.2  CL 105 105 106  CO2 25 27 24   GLUCOSE 182* 117* 162*  BUN 21 21 20   CREATININE 1.18 1.05 0.84  CALCIUM 9.0 8.5* 8.5*  MG  --  2.3 2.0  PHOS  --  4.6 2.3*   Liver Function Tests: Recent Labs  Lab 03/28/23 0014 03/28/23 0406 03/29/23 0416  AST 75* 122* 35  ALT 37 65* 38  ALKPHOS 96 103 80  BILITOT 0.5 0.4 0.5  PROT 7.2 7.3 6.6  ALBUMIN 4.1 3.7 3.5   CBG: Recent Labs  Lab 03/29/23 1631 03/29/23 1924 03/29/23 2255 03/30/23 0427 03/30/23 0800  GLUCAP 148* 153* 150* 155* 129*    Discharge time spent: greater than 30 minutes.  Signed: Alford Highland, MD [facility] 03/30/2023

## 2023-03-30 NOTE — Plan of Care (Signed)
  Problem: Education: Goal: Knowledge of General Education information will improve Description: Including pain rating scale, medication(s)/side effects and non-pharmacologic comfort measures 03/30/2023 0945 by Milderd Meager, RN Outcome: Adequate for Discharge 03/30/2023 0944 by Milderd Meager, RN Outcome: Progressing   Problem: Clinical Measurements: Goal: Ability to maintain clinical measurements within normal limits will improve Outcome: Adequate for Discharge Goal: Will remain free from infection Outcome: Adequate for Discharge Goal: Diagnostic test results will improve Outcome: Adequate for Discharge Goal: Respiratory complications will improve Outcome: Adequate for Discharge Goal: Cardiovascular complication will be avoided Outcome: Adequate for Discharge   Problem: Activity: Goal: Risk for activity intolerance will decrease Outcome: Adequate for Discharge   Problem: Nutrition: Goal: Adequate nutrition will be maintained Outcome: Adequate for Discharge   Problem: Coping: Goal: Level of anxiety will decrease Outcome: Adequate for Discharge   Problem: Elimination: Goal: Will not experience complications related to bowel motility Outcome: Adequate for Discharge Goal: Will not experience complications related to urinary retention Outcome: Adequate for Discharge   Problem: Pain Managment: Goal: General experience of comfort will improve and/or be controlled Outcome: Adequate for Discharge   Problem: Safety: Goal: Ability to remain free from injury will improve Outcome: Adequate for Discharge   Problem: Skin Integrity: Goal: Risk for impaired skin integrity will decrease Outcome: Adequate for Discharge

## 2023-03-30 NOTE — Plan of Care (Signed)
   Problem: Education: Goal: Knowledge of General Education information will improve Description Including pain rating scale, medication(s)/side effects and non-pharmacologic comfort measures Outcome: Progressing   Problem: Health Behavior/Discharge Planning: Goal: Ability to manage health-related needs will improve Outcome: Progressing

## 2023-03-31 ENCOUNTER — Telehealth (HOSPITAL_COMMUNITY): Payer: Self-pay | Admitting: Licensed Clinical Social Worker

## 2023-03-31 ENCOUNTER — Other Ambulatory Visit: Payer: Self-pay

## 2023-03-31 NOTE — Telephone Encounter (Signed)
 H&V Care Navigation CSW Progress Note  Clinical Social Worker contacted patient by phone to assist with transportation to appointment on Tuesday 3-4.  Patient is participating in a Managed Medicaid Plan:  no  CSW requested to assist patient with transportation to appointment in HF Clinic. Patient is not eligible for transport via [employer] because [place]. CSW unable to make contact with insurance provider to clarify benefit as patient has not updated his profile and unable to access account. Patient informed to contact insurance to update and that in the future may have transport services through insurance. CSW made arrangements with [employer] [facility] for Tuesdays appointment. Waiver attached and reviewed. Lasandra Beech, LCSW, CCSW-MCS [phone]   SDOH Screenings   Food Insecurity: No Food Insecurity (03/28/2023)  Housing: Low Risk  (03/29/2023)  Transportation Needs: Unmet Transportation Needs (03/31/2023)  Utilities: Not At Risk (03/28/2023)  Financial Resource Strain: Low Risk  (03/29/2023)  Social Connections: Unknown (03/28/2023)  Tobacco Use: High Risk (03/29/2023)    03/31/2023  Christopher Bryan. DOB: 07/23/1960 MRN: [id number]   RIDER WAIVER AND RELEASE OF LIABILITY  For the purposes of helping with transportation needs, [facility] partners with outside transportation providers (taxi companies, [employer], Catering manager.) to give [employer] patients or other approved people the choice of on-demand rides [employer]") to our buildings for non-emergency visits.  By using [employer], I, the person signing this document, on behalf of myself and/or any legal minors (in my care using the [employer]), agree:  Science writer given to me are supplied by independent, outside transportation providers who do not work for, or have any affiliation with, [employer]. [place] is not a transportation company. [place] has no control over the quality or safety of  the rides I get using [employer]. [place] has no control over whether any outside ride will happen on time or not. [place] gives no guarantee on the reliability, quality, safety, or availability on any rides, or that no mistakes will happen. I know and accept that traveling by vehicle (car, truck, SVU, Zenaida Niece, bus, taxi, etc.) has risks of serious injuries such as disability, being paralyzed, and death. I know and agree the risk of using [employer] is mine alone, and not [employer]. Transport Services are provided "as is" and as are available. The transportation providers are in charge for all inspections and care of the vehicles used to provide these rides. I agree not to take legal action against [place], its agents, employees, officers, directors, representatives, insurers, attorneys, assigns, successors, subsidiaries, and affiliates at any time for any reasons related directly or indirectly to using [employer]. I also agree not to take legal action against [place] or its affiliates for any injury, death, or damage to property caused by or related to using [employer]. I have read this Waiver and Release of Liability, and I understand the terms used in it and their legal meaning. This Waiver is freely and voluntarily given with the understanding that my right (or any legal minors) to legal action against [place] relating to [employer] is knowingly given up to use these services.   I attest that I read the Ride Waiver and Release of Liability to Christopher Bryan., gave Mr. Windholz the opportunity to ask questions and answered the questions asked (if any). I affirm that Christopher Bryan. then provided consent for assistance with transportation.     Lanetta Inch  Fransico Michael

## 2023-04-03 ENCOUNTER — Telehealth: Payer: Self-pay | Admitting: Family

## 2023-04-03 ENCOUNTER — Telehealth (HOSPITAL_COMMUNITY): Payer: Self-pay | Admitting: Licensed Clinical Social Worker

## 2023-04-03 NOTE — Telephone Encounter (Signed)
 Pt confirmed appt for 04/04/23

## 2023-04-03 NOTE — Telephone Encounter (Signed)
 CSW contacted [employer] to confirm pick up time for roundtrip to [facility] tomorrow Tuesday April 04, 2023. Patient will be picked up ay 8:45 am and clinic staff will call for return trip. CSW confirmed with patient who is grateful for the assistance. Lasandra Beech, LCSW, CCSW-MCS [phone]

## 2023-04-03 NOTE — Progress Notes (Unsigned)
 Advanced [facility] Failure Clinic Note   Referring Physician: recent admission PCP: [facility] Cardiologist: Julien Nordmann, MD   Chief Complaint:   HPI:  Christopher Bryan is a 63 y/o male with a history of COPD, PAD, dyslipidemia, HTN, atrial fibrillation, tobacco use, DVT, PE, cocaine use and chronic heart failure.   Admitted 02/28/23 with acute onset of worsening dyspnea with associated cough productive of yellowish sputum as well as wheezing for the last couple of days. In the ER, BP was 198/108 with heart rate of 103 and respiratory to 24, pulse symmetry was 96 to 100% on 40% FiO2 on BiPAP. Venous blood gas revealed pH 7.38 and a bicarbonate of 24.8. BMP was normal. BNP was 488.6. COVID-19 PCR came back positive. Initially needed bipap but then weaned off.   Admitted 03/27/23 due to shortness of breath due to COPD/ HF exacerbation. Given solu-medrol and placed on bipap. Has an area of large scab on his right forearm at a prior IV site. Antibiotics provided. Wound care consulted. Did not qualify for home bipap.   Echo 01/08/18: EF 65-70%, moderate LVH Echo 01/26/23: EF 35-40%, severe hypokinesis, RV normal, mild/moderate AR  R/LHC 01/30/23: Mild, nonobstructive coronary artery disease, as detailed below.  Cardiomyopathy appears nonischemic.  Elevated troponin is most likely due to supply demand mismatch in the setting acute respiratory failure with hypoxia and PEA arrest. Mildly elevated left and right heart filling pressures. Mildly elevated pulmonary artery pressure. Normal Fick cardiac output/index. Apparent short segment occlusion of the right radial artery.  Question thrombosis of the basilic vein.  He presents today for his initial HF visit with a chief complaint of   Review of Systems: [y] = yes, [ ]  = no   General: Weight gain [ ] ; Weight loss [ ] ; Anorexia [ ] ; Fatigue [ ] ; Fever [ ] ; Chills [ ] ; Weakness [ ]   Cardiac: Chest pain/pressure [ ] ; Resting SOB [ ] ;  Exertional SOB [ ] ; Orthopnea [ ] ; Pedal Edema [ ] ; Palpitations [ ] ; Syncope [ ] ; Presyncope [ ] ; Paroxysmal nocturnal dyspnea[ ]   Pulmonary: Cough [ ] ; Wheezing[ ] ; Hemoptysis[ ] ; Sputum [ ] ; Snoring [ ]   GI: Vomiting[ ] ; Dysphagia[ ] ; Melena[ ] ; Hematochezia [ ] ; Heartburn[ ] ; Abdominal pain [ ] ; Constipation [ ] ; Diarrhea [ ] ; BRBPR [ ]   GU: Hematuria[ ] ; Dysuria [ ] ; Nocturia[ ]   Vascular: Pain in legs with walking [ ] ; Pain in feet with lying flat [ ] ; Non-healing sores [ ] ; Stroke [ ] ; TIA [ ] ; Slurred speech [ ] ;  Neuro: Headaches[ ] ; Vertigo[ ] ; Seizures[ ] ; Paresthesias[ ] ;Blurred vision [ ] ; Diplopia [ ] ; Vision changes [ ]   Ortho/Skin: Arthritis [ ] ; Joint pain [ ] ; Muscle pain [ ] ; Joint swelling [ ] ; Back Pain [ ] ; Rash [ ]   Psych: Depression[ ] ; Anxiety[ ]   Heme: Bleeding problems [ ] ; Clotting disorders [ ] ; Anemia [ ]   Endocrine: Diabetes [ ] ; Thyroid dysfunction[ ]    Past Medical History:  Diagnosis Date   A-fib (HCC)    CHF (congestive heart failure) (HCC)    COPD (chronic obstructive pulmonary disease) (HCC)    DVT (deep venous thrombosis) (HCC)    Hypertension    Peripheral arterial disease (HCC)    Right-sided heart failure (HCC)    Smoker     Current Outpatient Medications  Medication Sig Dispense Refill   albuterol (PROVENTIL HFA;VENTOLIN HFA) 108 (90 Base) MCG/ACT inhaler Inhale 2 puffs into the lungs every 6 (  six) hours as needed for wheezing or shortness of breath.     apixaban (ELIQUIS) 5 MG TABS tablet Take 1 tablet (5 mg total) by mouth 2 (two) times daily. 30 tablet 3   atorvastatin (LIPITOR) 40 MG tablet Take 20 mg by mouth at bedtime.     azithromycin (ZITHROMAX) 250 MG tablet Take 1 tablet (250 mg total) by mouth daily for 2 days. 2 each 0   carboxymethylcellulose (REFRESH PLUS) 0.5 % SOLN Place 1 drop into both eyes 4 (four) times daily as needed. 15 mL 0   carvedilol (COREG) 6.25 MG tablet Take 6.25 mg by mouth 2 (two) times daily with a meal.      cholecalciferol (VITAMIN D) 1000 units tablet Take 2,000 Units by mouth daily.     clopidogrel (PLAVIX) 75 MG tablet Take 1 tablet (75 mg total) by mouth daily. 30 tablet 0   diclofenac Sodium (VOLTAREN) 1 % GEL Apply 2 g topically 3 (three) times daily as needed.     empagliflozin (JARDIANCE) 10 MG TABS tablet Take 1 tablet (10 mg total) by mouth daily before breakfast. 30 tablet 0   fluticasone-salmeterol (ADVAIR) 250-50 MCG/ACT AEPB Inhale 1 puff into the lungs in the morning and at bedtime.     furosemide (LASIX) 20 MG tablet Take 1 tablet (20 mg total) by mouth daily as needed for edema (weight gain of 3 lbs in a day or 5 lbs in a week). 30 tablet 0   gabapentin (NEURONTIN) 300 MG capsule Take 600 mg by mouth 3 (three) times daily. TAKE TWO CAPSULES BY MOUTH THREE TIMES A DAY *START WITH 1 CAPSULE THREE TIMES A DAY AND INCREASE BY 1 CAPSULE EVERY 3 DAYS UNTIL TAKING TWO CAPSULES THREE TIMES A DAY     lidocaine (LIDODERM) 5 % Place 1 patch onto the skin daily. Remove & Discard patch within 12 hours or as directed by MD     lisinopril (ZESTRIL) 20 MG tablet Take 1 tablet (20 mg total) by mouth daily. 30 tablet 0   melatonin 3 MG TABS tablet Take 3 mg by mouth at bedtime.     methocarbamol (ROBAXIN) 500 MG tablet Take 500 mg by mouth every 8 (eight) hours as needed for muscle spasms.     mirtazapine (REMERON) 7.5 MG tablet Take 7.5 mg by mouth at bedtime.     nicotine polacrilex (COMMIT) 4 MG lozenge Take 4 mg by mouth as needed. DISSOLVE 1 LOZENGE BY MOUTH AS DIRECTED FOR SMOKING CESSATION USE AT LEAST 8-10 LOZENGES EACH DAY TO START. DO NOT USE MORE THAN 20 LOZENGES IN A DAY. DO NOT EAT OR DRINK FOR 15 MINUTES BEFORE AND DURING USE     pantoprazole (PROTONIX) 20 MG tablet Take 20 mg by mouth daily.     polyethylene glycol powder (GLYCOLAX/MIRALAX) 17 GM/SCOOP powder Take 17 g by mouth daily as needed for moderate constipation. 510 g 1   predniSONE (DELTASONE) 10 MG tablet Take 4 tablets (40 mg  total) by mouth daily for 2 more days. 8 tablet 0   sennosides-docusate sodium (SENOKOT-S) 8.6-50 MG tablet Take 1-2 tablets by mouth at bedtime as needed for constipation.     sildenafil (VIAGRA) 100 MG tablet Take 100 mg by mouth daily as needed for erectile dysfunction.     spironolactone (ALDACTONE) 25 MG tablet Take 0.5 tablets (12.5 mg total) by mouth daily. 30 tablet 0   tiotropium (SPIRIVA) 18 MCG inhalation capsule Place 18 mcg into inhaler and inhale  daily.     No current facility-administered medications for this visit.    Allergies  Allergen Reactions   Iodine Rash   Ivp Dye [Iodinated Contrast Media] Hives   Latex Hives and Itching   Iodinated Contrast Media Hives and Rash   Other Itching and Rash    [place] Meat allergy per patient      Social History   Socioeconomic History   Marital status: Single    Spouse name: Not on file   Number of children: 2   Years of education: Not on file   Highest education level: 10th grade  Occupational History   Occupation: Retired  Tobacco Use   Smoking status: Every Day    Current packs/day: 0.50    Average packs/day: 0.5 packs/day for 40.0 years (20.0 ttl pk-yrs)    Types: Cigarettes   Smokeless tobacco: Never  Vaping Use   Vaping status: Former  Substance and Sexual Activity   Alcohol use: Not Currently    Comment: 0   Drug use: Yes    Frequency: 1.0 times per week    Types: "Crack" cocaine, Marijuana   Sexual activity: Not Currently  Other Topics Concern   Not on file  Social History Narrative   ** Merged History Encounter **       Social Drivers of Health   Financial Resource Strain: Low Risk  (03/29/2023)   Overall Financial Resource Strain (CARDIA)    Difficulty of Paying Living Expenses: Not hard at all  Food Insecurity: No Food Insecurity (03/28/2023)   Hunger Vital Sign    Worried About Running Out of Food in the Last Year: Never true    Ran Out of Food in the Last Year: Never true  Transportation  Needs: Unmet Transportation Needs (04/03/2023)   PRAPARE - Administrator, Civil Service (Medical): Yes    Lack of Transportation (Non-Medical): Yes  Physical Activity: Not on file  Stress: Not on file  Social Connections: Unknown (03/28/2023)   Social Connection and Isolation Panel [NHANES]    Frequency of Communication with Friends and Family: Twice a week    Frequency of Social Gatherings with Friends and Family: Once a week    Attends Religious Services: 1 to 4 times per year    Active Member of [employer] or Organizations: No    Attends Banker Meetings: Never    Marital Status: Patient declined  Catering manager Violence: Not At Risk (03/28/2023)   Humiliation, Afraid, Rape, and Kick questionnaire    Fear of Current or Ex-Partner: No    Emotionally Abused: No    Physically Abused: No    Sexually Abused: No      Family History  Problem Relation Age of Onset   CAD Mother    Emphysema Father        PHYSICAL EXAM: General:  Well appearing. No respiratory difficulty HEENT: normal Neck: supple. no JVD. Carotids 2+ bilat; no bruits. No lymphadenopathy or thyromegaly appreciated. Cor: PMI nondisplaced. Regular rate & rhythm. No rubs, gallops or murmurs. Lungs: clear Abdomen: soft, nontender, nondistended. No hepatosplenomegaly. No bruits or masses. Good bowel sounds. Extremities: no cyanosis, clubbing, rash, edema Neuro: alert & oriented x 3, cranial nerves grossly intact. moves all 4 extremities w/o difficulty. Affect pleasant.  ECG:   ASSESSMENT & PLAN:  1: Chronic heart failure with reduced ejection fraction- - suspect due to  - NYHA class - euvolemic - weighing daily - Echo 01/08/18: EF 65-70%, moderate LVH -  Echo 01/26/23: EF 35-40%, severe hypokinesis, RV normal, mild/moderate AR - continue  - BNP 03/28/23 reviewed and was 507.8  2: HTN- - BP - sees PCP at [facility] - BMET 03/09/23 reviewed and showed sodium 137, potassium 4.2, creatinine  0.84 & GFR >60  3: Nonobstructive CAD- - saw [facility] cardiology during 12/24 admission - [facility] 01/30/23:   1. Mild, nonobstructive coronary artery disease, as detailed below.  Cardiomyopathy appears nonischemic.  Elevated troponin is most likely due to supply    demand mismatch in the setting acute respiratory failure with hypoxia and PEA arrest.    2. Mildly elevated left and right heart filling pressures.    3. Mildly elevated pulmonary artery pressure.    4. Normal Fick cardiac output/index.    5.  Apparent short segment occlusion of the right radial artery.  Question thrombosis of the basilic vein.  4: Atrial fibrillation- - EKG  5: Substance use-   Delma Freeze, FNP 04/03/23

## 2023-04-04 ENCOUNTER — Ambulatory Visit: Payer: No Typology Code available for payment source | Attending: Family | Admitting: Family

## 2023-04-04 ENCOUNTER — Encounter: Payer: Self-pay | Admitting: Family

## 2023-04-04 VITALS — BP 142/84 | HR 78 | Wt 121.0 lb

## 2023-04-04 DIAGNOSIS — F1090 Alcohol use, unspecified, uncomplicated: Secondary | ICD-10-CM | POA: Diagnosis not present

## 2023-04-04 DIAGNOSIS — J449 Chronic obstructive pulmonary disease, unspecified: Secondary | ICD-10-CM | POA: Insufficient documentation

## 2023-04-04 DIAGNOSIS — Z86718 Personal history of other venous thrombosis and embolism: Secondary | ICD-10-CM | POA: Insufficient documentation

## 2023-04-04 DIAGNOSIS — I25118 Atherosclerotic heart disease of native coronary artery with other forms of angina pectoris: Secondary | ICD-10-CM

## 2023-04-04 DIAGNOSIS — Z951 Presence of aortocoronary bypass graft: Secondary | ICD-10-CM | POA: Insufficient documentation

## 2023-04-04 DIAGNOSIS — I739 Peripheral vascular disease, unspecified: Secondary | ICD-10-CM | POA: Diagnosis not present

## 2023-04-04 DIAGNOSIS — Z8674 Personal history of sudden cardiac arrest: Secondary | ICD-10-CM | POA: Insufficient documentation

## 2023-04-04 DIAGNOSIS — I2510 Atherosclerotic heart disease of native coronary artery without angina pectoris: Secondary | ICD-10-CM | POA: Insufficient documentation

## 2023-04-04 DIAGNOSIS — I10 Essential (primary) hypertension: Secondary | ICD-10-CM | POA: Diagnosis not present

## 2023-04-04 DIAGNOSIS — Z86711 Personal history of pulmonary embolism: Secondary | ICD-10-CM | POA: Diagnosis not present

## 2023-04-04 DIAGNOSIS — E785 Hyperlipidemia, unspecified: Secondary | ICD-10-CM | POA: Insufficient documentation

## 2023-04-04 DIAGNOSIS — I5022 Chronic systolic (congestive) heart failure: Secondary | ICD-10-CM | POA: Insufficient documentation

## 2023-04-04 DIAGNOSIS — Z7902 Long term (current) use of antithrombotics/antiplatelets: Secondary | ICD-10-CM | POA: Diagnosis not present

## 2023-04-04 DIAGNOSIS — I480 Paroxysmal atrial fibrillation: Secondary | ICD-10-CM | POA: Diagnosis not present

## 2023-04-04 DIAGNOSIS — R0602 Shortness of breath: Secondary | ICD-10-CM | POA: Diagnosis present

## 2023-04-04 DIAGNOSIS — F1990 Other psychoactive substance use, unspecified, uncomplicated: Secondary | ICD-10-CM

## 2023-04-04 DIAGNOSIS — I4891 Unspecified atrial fibrillation: Secondary | ICD-10-CM | POA: Diagnosis not present

## 2023-04-04 DIAGNOSIS — F17210 Nicotine dependence, cigarettes, uncomplicated: Secondary | ICD-10-CM | POA: Diagnosis not present

## 2023-04-04 DIAGNOSIS — I110 Hypertensive heart disease with heart failure: Secondary | ICD-10-CM | POA: Insufficient documentation

## 2023-04-04 NOTE — Patient Instructions (Signed)
 Medication Changes:  No medication changes  Lab Work:  Go DOWN to LOWER LEVEL (LL) to have your blood work completed inside of [employer] office.  We will only call you if the results are abnormal or if the provider would like to make medication changes.   Referrals:  We have placed a referral today to Cardiology for your Atrial-Fibrillation. They should reach out to you within 1-2 weeks. If they do not, please reach out to them. Their information is below on this AVS.   Special Instructions // Education:  It was great to meet you today!  Bring your medications to every visit.   Follow-Up in: 3 weeks with Christopher Kindred, FNP.  At the [facility], you and your health needs are our priority. We have a designated team specialized in the treatment of Heart Failure. This Care Team includes your primary Heart Failure Specialized Cardiologist (physician), Advanced Practice Providers (APPs- Physician Assistants and Nurse Practitioners), and Pharmacist who all work together to provide you with the care you need, when you need it.   You may see any of the following providers on your designated Care Team at your next follow up:  Dr. Arvilla Meres Dr. Marca Ancona Dr. Dorthula Nettles Dr. Theresia Bough Christopher Kindred, FNP Christopher Bryan, RPH-CPP  Please be sure to bring in all your medications bottles to every appointment.   Need to Contact us:  If you have any questions or concerns before your next appointment please send [place] a message through [place] or call our office at [phone].    TO LEAVE A MESSAGE FOR THE NURSE SELECT OPTION 2, PLEASE LEAVE A MESSAGE INCLUDING: YOUR NAME DATE OF BIRTH CALL BACK NUMBER REASON FOR CALL**this is important as we prioritize the call backs  YOU WILL RECEIVE A CALL BACK THE SAME DAY AS LONG AS YOU CALL BEFORE 4:00 PM

## 2023-04-05 LAB — BASIC METABOLIC PANEL
BUN/Creatinine Ratio: 10 (ref 10–24)
BUN: 11 mg/dL (ref 8–27)
CO2: 24 mmol/L (ref 20–29)
Calcium: 10 mg/dL (ref 8.6–10.2)
Chloride: 103 mmol/L (ref 96–106)
Creatinine, Ser: 1.06 mg/dL (ref 0.76–1.27)
Glucose: 85 mg/dL (ref 70–99)
Potassium: 4.9 mmol/L (ref 3.5–5.2)
Sodium: 145 mmol/L — ABNORMAL HIGH (ref 134–144)
eGFR: 78 mL/min/{1.73_m2} (ref >59)

## 2023-04-06 ENCOUNTER — Other Ambulatory Visit: Payer: Self-pay

## 2023-04-17 ENCOUNTER — Other Ambulatory Visit: Payer: Self-pay

## 2023-04-28 ENCOUNTER — Telehealth: Payer: Self-pay | Admitting: Family

## 2023-04-28 NOTE — Telephone Encounter (Incomplete)
 Called to confirm/remind patient of their appointment at the Advanced [facility] Failure Clinic on 05/02/23***.   Appointment:   [] Confirmed  [] Left mess   [x] No answer/No voice mail  [] Phone not in service  Patient reminded to bring all medications and/or complete list.  Confirmed patient has transportation. Gave directions, instructed to utilize valet parking.

## 2023-05-01 ENCOUNTER — Encounter: Payer: Self-pay | Admitting: Medical

## 2023-05-01 ENCOUNTER — Ambulatory Visit: Attending: Medical | Admitting: Medical

## 2023-05-01 ENCOUNTER — Ambulatory Visit

## 2023-05-01 VITALS — BP 122/74 | HR 68 | Ht 68.0 in | Wt 120.4 lb

## 2023-05-01 DIAGNOSIS — I480 Paroxysmal atrial fibrillation: Secondary | ICD-10-CM

## 2023-05-01 DIAGNOSIS — I25118 Atherosclerotic heart disease of native coronary artery with other forms of angina pectoris: Secondary | ICD-10-CM | POA: Diagnosis not present

## 2023-05-01 DIAGNOSIS — I5022 Chronic systolic (congestive) heart failure: Secondary | ICD-10-CM | POA: Diagnosis not present

## 2023-05-01 DIAGNOSIS — I491 Atrial premature depolarization: Secondary | ICD-10-CM

## 2023-05-01 DIAGNOSIS — I428 Other cardiomyopathies: Secondary | ICD-10-CM | POA: Diagnosis not present

## 2023-05-01 DIAGNOSIS — F1990 Other psychoactive substance use, unspecified, uncomplicated: Secondary | ICD-10-CM

## 2023-05-01 DIAGNOSIS — Z79899 Other long term (current) drug therapy: Secondary | ICD-10-CM

## 2023-05-01 MED ORDER — ENTRESTO 24-26 MG PO TABS
1.0000 | ORAL_TABLET | Freq: Two times a day (BID) | ORAL | 3 refills | Status: AC
Start: 2023-05-01 — End: ?

## 2023-05-01 NOTE — Progress Notes (Signed)
 Cardiology Office Note:  .   Date:  05/01/2023  ID:  Christopher Joseph., DOB [birth year], MRN [id number] [facility]: [facility]  [facility] Cardiologist:  Julien Nordmann, MD     History of Present Illness: .   Christopher Fout. is a 63 y.o. male with a history of systolic heart failure, NICM, atrial fibrillation,  nonobstructive CAD, tobacco abuse, PAD, hypertension, history of DVT/PE, cocaine abuse who is being seen for hospital follow-up.  Patient has had a few hospitalizations/ER visits in the last few months. He regularly sees the [place].  The patient was admitted in December 2024 with acute on chronic respiratory failure with hypoxic and hypercapnic respiratory failure, COPD exacerbation, adenovirus, status post PEA arrest x 2 with elevated high-sensitivity troponin, acute systolic heart failure, sepsis due to pneumonia, polysubstance abuse with UDS positive for cocaine, and AKI.  Cardiac cath showed nonobstructive left circumflex disease.  Echo showed EF 35 to 40% with regional wall motion abnormalities.  Patient was started on GDMT.  Patient was continue on Eliquis for arterial and venous thromboembolism/PAD/Afib.  Aspirin was stopped and Plavix was restarted.  The patient was admitted to 2/24-2/27 with acute respiratory failure with hypoxia and hypercapnia secondary to COPD exacerbation and heart failure exacerbation started on Solu-Medrol and BiPAP.  Today, the patient is overall doing OK. Still gets tired at times. He uses a cane at baseline. He does get SOB on exertion. He is down to smoking 2 cigarettes a week. He reports brief chest pain that was sharp and nonexertional. He reports swelling on his left leg. He takes lasix as needed.  Last cocaine [place] was around 04/04/23. He drinks 1 beer weekly.   Studies Reviewed: Marland Kitchen   EKG Interpretation Date/Time:  Monday May 01 2023 10:18:55 EDT Ventricular Rate:  68 PR Interval:  154 QRS Duration:  100 QT  Interval:  398 QTC Calculation: 423 R Axis:   45  Text Interpretation: Sinus rhythm with Premature atrial complexes ST & T wave abnormality, consider inferior ischemia LVH with repol abnormality When compared with ECG of 04-Apr-2023 09:59, Premature ventricular complexes are no longer Present Premature atrial complexes are now Present Confirmed by Fransico Michael, Trevell Pariseau ([id number]) on 05/01/2023 10:31:22 AM    R/L heart cath 01/2023 Conclusions: Mild, nonobstructive coronary artery disease, as detailed below.  Cardiomyopathy appears nonischemic.  Elevated troponin is most likely due to supply demand mismatch in the setting acute respiratory failure with hypoxia and PEA arrest. Mildly elevated left and right heart filling pressures. Mildly elevated pulmonary artery pressure. Normal Fick cardiac output/index. Apparent short segment occlusion of the right radial artery.  Question thrombosis of the basilic vein.   Recommendations: Continue primary prevention of coronary artery disease. Escalate goal-directed medical therapy for nonischemic cardiomyopathy. Consider right radial artery and right upper arm venous duplex studies to evaluate for thrombus.  If thrombus formation is confirmed, recommend resumption of anticoagulation.  Given that the patient has been on heparin, it would be prudent to exclude heparin-induced thrombocytopenia if clot is confirmed.   Yvonne Kendall, MD [facility]    Echo 01/2023 1. Left ventricular ejection fraction, by estimation, is 35 to 40%. The  left ventricle has moderately decreased function. The left ventricle  demonstrates mild global hypokinesis with regional wall motion  abnormalities (severe hypokinesis of the  anterior, anteroseptal and apical region). The average left ventricular  global longitudinal strain is -9.3 %.   2. Right ventricular systolic function is normal. The  right ventricular  size is normal.   3. The mitral valve is normal in structure. No  evidence of mitral valve  regurgitation. No evidence of mitral stenosis.   4. The aortic valve is normal in structure. Aortic valve regurgitation is  mild to moderate. Aortic valve sclerosis/calcification is present, without  any evidence of aortic stenosis.   5. The inferior vena cava is normal in size with greater than 50%  respiratory variability, suggesting right atrial pressure of 3 mmHg.      Physical Exam:   VS:  BP 122/74 (BP Location: Left Arm, Patient Position: Sitting, Cuff Size: Normal)   Pulse 68   Ht 5\' 8"  (1.727 m)   Wt 120 lb 6.4 oz (54.6 kg)   SpO2 91%   BMI 18.31 kg/m    Wt Readings from Last 3 Encounters:  05/01/23 120 lb 6.4 oz (54.6 kg)  04/04/23 121 lb (54.9 kg)  03/30/23 119 lb 11.4 oz (54.3 kg)    GEN: Well nourished, well developed in no acute distress NECK: No JVD; No carotid bruits CARDIAC: RRR, no murmurs, rubs, gallops RESPIRATORY:  Clear to auscultation without rales, wheezing or rhonchi  ABDOMEN: Soft, non-tender, non-distended EXTREMITIES:  No edema; No deformity   ASSESSMENT AND PLAN: .    HFrEF NICM Echo in December 2024 showed EF of 35 to 40%.  Cardiac cath showed nonobstructive CAD.  Patient with a history of cocaine use, which may be contributing to cardiomyopathy.  Patient reports chronic swelling on the left.  Overall he does not look significantly volume up on exam.  Continue Coreg 6.25 mg twice daily, Jardiance 10 mg daily, spironolactone 12.5 mg daily and Lasix as needed for lower leg edema.  I will stop lisinopril, and in 2 days have him start Entresto 24-26 mg twice daily. I will repeat a limited echo in 1 month. Patient is followed by the advanced heart failure team.  Nonobstructive CAD by cath 01/2023 Patient reports atypical chest pain.  He is not very active at baseline.  No further cardiac work-up at this time. Continue Plavix 75 mg daily and Lipitor 20 mg daily.  No issues with the cath site.  Paroxysmal Afib Patient is in normal  sinus rhythm on EKG today.  Continue Eliquis 5 mg twice daily.  Continue Coreg 6.25 mg twice daily for rate control.  Substance abuse The patient is drinking 1 beer weekly. He is down to smoking 2 cigarettes weekly.  His last cocaine usage was right around 04/04/2023. Cessation recommended.  PACs EKGs shows normal sinus rhythm with PACs.  I will order a heart monitor to quantify this.  Continue Coreg 6.25 mg twice daily  Dispo: Follow-up in 3 months  Signed, Christopher Bryan David Stall, PA-C

## 2023-05-01 NOTE — Patient Instructions (Signed)
 Medication Instructions:  Your physician recommends the following medication changes.  STOP TAKING: Lisinopril  START TAKING: Entresto 24-26 mg by mouth twice a day two days after you have stopped lisinopril  *If you need a refill on your cardiac medications before your next appointment, please call your pharmacy*  Lab Work: Your provider would like for you to return in 2 weeks to have the following labs drawn: (BMP).   Please go to [facility] [address] [address], [place] [address] You do not need an appointment.  They are open from 8 am- 4:30 pm.  Lunch from 1:00 pm- 2:00 pm You will not need to be fasting.   Testing/Procedures: Your physician has requested that you have an Limited echocardiogram. Echocardiography is a painless test that uses sound waves to create images of your heart. It provides your doctor with information about the size and shape of your heart and how well your heart's chambers and valves are working.   You may receive an ultrasound enhancing agent through an IV if needed to better visualize your heart during the echo. This procedure takes approximately one hour.  There are no restrictions for this procedure.  This will take place at [address] [facility]) [address], [place] [address]  Please note: We ask at that you not bring children with you during ultrasound (echo/ vascular) testing. Due to room size and safety concerns, children are not allowed in the ultrasound rooms during exams. Our front office staff cannot provide observation of children in our lobby area while testing is being conducted. An adult accompanying a patient to their appointment will only be allowed in the ultrasound room at the discretion of the ultrasound technician under special circumstances. We apologize for any inconvenience.   Your physician has recommended that you wear a 14 day Zio monitor.   This monitor is a medical device  that records the heart's electrical activity. Doctors most often use these monitors to diagnose arrhythmias. Arrhythmias are problems with the speed or rhythm of the heartbeat. The monitor is a small device applied to your chest. You can wear one while you do your normal daily activities. While wearing this monitor if you have any symptoms to push the button and record what you felt. Once you have worn this monitor for the period of time provider prescribed (Usually 14 days), you will return the monitor device in the postage paid box. Once it is returned they will download the data collected and provide [place] with a report which the provider will then review and we will call you with those results. Important tips:  Avoid showering during the first 24 hours of wearing the monitor. Avoid excessive sweating to help maximize wear time. Do not submerge the device, no hot tubs, and no swimming pools. Keep any lotions or oils away from the patch. After 24 hours you may shower with the patch on. Take brief showers with your back facing the shower head.  Do not remove patch once it has been placed because that will interrupt data and decrease adhesive wear time. Push the button when you have any symptoms and write down what you were feeling. Once you have completed wearing your monitor, remove and place into box which has postage paid and place in your outgoing mailbox.  If for some reason you have misplaced your box then call our office and we can provide another box and/or mail it off for you.  Follow-Up: At [facility], you and your health needs are our priority.  As part of our continuing mission to provide you with exceptional heart care, our providers are all part of one team.  This team includes your primary Cardiologist (physician) and Advanced Practice Providers or APPs (Physician Assistants and Nurse Practitioners) who all work together to provide you with the care you need, when you need  it.  Your next appointment:   3 month(s)  Provider:   You may see Julien Nordmann, MD or one of the following Advanced Practice Providers on your designated Care Team:   Nicolasa Ducking, NP Ames Dura, PA-C Eula Listen, PA-C Cadence Kellogg, PA-C Charlsie Quest, NP Carlos Levering, NP    We recommend signing up for the patient portal called "MyChart".  Sign up information is provided on this After Visit Summary.  MyChart is used to connect with patients for Virtual Visits (Telemedicine).  Patients are able to view lab/test results, encounter notes, upcoming appointments, etc.  Non-urgent messages can be sent to your provider as well.   To learn more about what you can do with MyChart, go to [email or web address].

## 2023-05-02 ENCOUNTER — Encounter: Admitting: Family

## 2023-05-12 ENCOUNTER — Encounter: Admitting: Family

## 2023-05-12 ENCOUNTER — Telehealth: Payer: Self-pay | Admitting: Family

## 2023-05-12 NOTE — Progress Notes (Deleted)
 Advanced Heart Failure Clinic Note    Referring Physician: recent admission PCP: [facility] Cardiologist: Julien Nordmann, MD   Chief Complaint: shortness of breath  HPI:  Christopher Bryan is a 63 y/o male with a history of COPD, PAD, CABG (2017), dyslipidemia, HTN, atrial fibrillation, tobacco use, DVT, PE, cocaine use and chronic heart failure. In 01/2023, experienced a witnessed PEA cardiac arrest in the ED after 1 round of CPR converting to wide complex tachycardia appearing metabolic in nature and ROSC was achieved after calcium gluconate and bicarbonate. Pulses were lost again with ROSC after repeat administration of epinephrine, bicarbonate, and calcium.   Admitted 02/28/23 with acute onset of worsening dyspnea with associated cough productive of yellowish sputum as well as wheezing for the last couple of days. In the ER, BP was 198/108 with heart rate of 103 and respiratory to 24, pulse symmetry was 96 to 100% on 40% FiO2 on BiPAP. Venous blood gas revealed pH 7.38 and a bicarbonate of 24.8. BMP was normal. BNP was 488.6. COVID-19 PCR came back positive. Initially needed bipap but then weaned off.   Admitted 03/27/23 due to shortness of breath due to COPD/ HF exacerbation. Given solu-medrol and placed on bipap. Has an area of large scab on his right forearm at a prior IV site. Antibiotics provided. Wound care consulted. Did not qualify for home bipap.   Echo 01/08/18: EF 65-70%, moderate LVH Echo 01/26/23: EF 35-40%, severe hypokinesis, RV normal, mild/moderate AR  R/LHC 01/30/23: Mild, nonobstructive coronary artery disease, as detailed below.  Cardiomyopathy appears nonischemic.  Elevated troponin is most likely due to supply demand mismatch in the setting acute respiratory failure with hypoxia and PEA arrest. Mildly elevated left and right heart filling pressures. Mildly elevated pulmonary artery pressure. Normal Fick cardiac output/index. Apparent short segment occlusion  of the right radial artery.  Question thrombosis of the basilic vein.  He presents today for his initial HF visit with a chief complaint of minimal shortness of breath (improving). Has associated fatigue, occasional chest pain and palpitations along with this. Denies cough, abdominal distention, pedal edema, dizziness, weight gain or difficulty sleeping. Reports sleeping well on 2 pillows  Weighing daily and says that his home weight has stayed around 122 pounds  Smoking 4 cigarettes/ week and has been smoking since age 43. Drinks 16 oz beer daily. Drank hard liquor until he stopped ~ 2022  He had confusion over his discharged medications and his [facility] medications. He was concerned that he was given duplicates but with different names so there are numerous meds that he hasn't been taking since discharge because of this. Did bring medications in for review.    Review of Systems: [y] = yes, [ ]  = no   General: Weight gain [ ] ; Weight loss [ ] ; Anorexia [ ] ; Fatigue Cove.Etienne ]; Fever [ ] ; Chills [ ] ; Weakness [ ]   Cardiac: Chest pain/pressure Cove.Etienne ]; Resting SOB [ ] ; Exertional SOB Cove.Etienne ]; Orthopnea [ ] ; Pedal Edema [ ] ; Palpitations Cove.Etienne ]; Syncope [ ] ; Presyncope [ ] ; Paroxysmal nocturnal dyspnea[ ]   Pulmonary: Cough [ ] ; Wheezing[ ] ; Hemoptysis[ ] ; Sputum [ ] ; Snoring [ ]   GI: Vomiting[ ] ; Dysphagia[ ] ; Melena[ ] ; Hematochezia [ ] ; Heartburn[ ] ; Abdominal pain [ ] ; Constipation [ ] ; Diarrhea [ ] ; BRBPR [ ]   GU: Hematuria[ ] ; Dysuria [ ] ; Nocturia[ ]   Vascular: Pain in legs with walking [ ] ; Pain in feet with lying flat [ ] ; Non-healing sores [ ] ;  Stroke [ ] ; TIA [ ] ; Slurred speech [ ] ;  Neuro: Headaches[ ] ; Vertigo[ ] ; Seizures[ ] ; Paresthesias[ ] ;Blurred vision [ ] ; Diplopia [ ] ; Vision changes [ ]   Ortho/Skin: Arthritis [ ] ; Joint pain [ ] ; Muscle pain [ ] ; Joint swelling [ ] ; Back Pain [ ] ; Rash [ ]   Psych: Depression[ ] ; Anxiety[ ]   Heme: Bleeding problems [ ] ; Clotting disorders [ ] ; Anemia [ ]    Endocrine: Diabetes [ ] ; Thyroid dysfunction[ ]    Past Medical History:  Diagnosis Date   A-fib (HCC)    CHF (congestive heart failure) (HCC)    COPD (chronic obstructive pulmonary disease) (HCC)    DVT (deep venous thrombosis) (HCC)    Hypertension    Peripheral arterial disease (HCC)    Right-sided heart failure (HCC)    Smoker     Current Outpatient Medications  Medication Sig Dispense Refill   albuterol (PROVENTIL HFA;VENTOLIN HFA) 108 (90 Base) MCG/ACT inhaler Inhale 2 puffs into the lungs every 6 (six) hours as needed for wheezing or shortness of breath.     apixaban (ELIQUIS) 5 MG TABS tablet Take 1 tablet (5 mg total) by mouth 2 (two) times daily. 30 tablet 3   atorvastatin (LIPITOR) 40 MG tablet Take 20 mg by mouth at bedtime.     azithromycin (ZITHROMAX) 250 MG tablet Take 1 tablet (250 mg total) by mouth daily for 2 days. 2 each 0   carboxymethylcellulose (REFRESH PLUS) 0.5 % SOLN Place 1 drop into both eyes 4 (four) times daily as needed. 15 mL 0   carvedilol (COREG) 6.25 MG tablet Take 6.25 mg by mouth 2 (two) times daily with a meal.     cholecalciferol (VITAMIN D) 1000 units tablet Take 2,000 Units by mouth daily.     clopidogrel (PLAVIX) 75 MG tablet Take 1 tablet (75 mg total) by mouth daily. 30 tablet 0   diclofenac Sodium (VOLTAREN) 1 % GEL Apply 2 g topically 3 (three) times daily as needed.     empagliflozin (JARDIANCE) 10 MG TABS tablet Take 1 tablet (10 mg total) by mouth daily before breakfast. 30 tablet 0   fluticasone-salmeterol (ADVAIR) 250-50 MCG/ACT AEPB Inhale 1 puff into the lungs in the morning and at bedtime.     furosemide (LASIX) 20 MG tablet Take 1 tablet (20 mg total) by mouth daily as needed for edema (weight gain of 3 lbs in a day or 5 lbs in a week). 30 tablet 0   gabapentin (NEURONTIN) 300 MG capsule Take 600 mg by mouth 3 (three) times daily. TAKE TWO CAPSULES BY MOUTH THREE TIMES A DAY *START WITH 1 CAPSULE THREE TIMES A DAY AND INCREASE BY 1  CAPSULE EVERY 3 DAYS UNTIL TAKING TWO CAPSULES THREE TIMES A DAY     lidocaine (LIDODERM) 5 % Place 1 patch onto the skin daily. Remove & Discard patch within 12 hours or as directed by MD     melatonin 3 MG TABS tablet Take 3 mg by mouth at bedtime.     methocarbamol (ROBAXIN) 500 MG tablet Take 500 mg by mouth every 8 (eight) hours as needed for muscle spasms.     mirtazapine (REMERON) 7.5 MG tablet Take 7.5 mg by mouth at bedtime.     nicotine polacrilex (COMMIT) 4 MG lozenge Take 4 mg by mouth as needed. DISSOLVE 1 LOZENGE BY MOUTH AS DIRECTED FOR SMOKING CESSATION USE AT LEAST 8-10 LOZENGES EACH DAY TO START. DO NOT USE MORE THAN 20 LOZENGES IN  A DAY. DO NOT EAT OR DRINK FOR 15 MINUTES BEFORE AND DURING USE     pantoprazole (PROTONIX) 20 MG tablet Take 20 mg by mouth daily.     polyethylene glycol powder (GLYCOLAX/MIRALAX) 17 GM/SCOOP powder Take 17 g by mouth daily as needed for moderate constipation. 510 g 1   predniSONE (DELTASONE) 10 MG tablet Take 4 tablets (40 mg total) by mouth daily for 2 more days. 8 tablet 0   sacubitril-valsartan (ENTRESTO) 24-26 MG Take 1 tablet by mouth 2 (two) times daily. 180 tablet 3   sennosides-docusate sodium (SENOKOT-S) 8.6-50 MG tablet Take 1-2 tablets by mouth at bedtime as needed for constipation.     sildenafil (VIAGRA) 100 MG tablet Take 100 mg by mouth daily as needed for erectile dysfunction.     spironolactone (ALDACTONE) 25 MG tablet Take 0.5 tablets (12.5 mg total) by mouth daily. 30 tablet 0   tiotropium (SPIRIVA) 18 MCG inhalation capsule Place 18 mcg into inhaler and inhale daily.     No current facility-administered medications for this visit.    Allergies  Allergen Reactions   Iodine Rash   Ivp Dye [Iodinated Contrast Media] Hives   Latex Hives and Itching   Iodinated Contrast Media Hives and Rash   Other Itching and Rash    [place] Meat allergy per patient      Social History   Socioeconomic History   Marital status: Single     Spouse name: Not on file   Number of children: 2   Years of education: Not on file   Highest education level: 10th grade  Occupational History   Occupation: Retired  Tobacco Use   Smoking status: Every Day    Current packs/day: 0.50    Average packs/day: 0.5 packs/day for 40.0 years (20.0 ttl pk-yrs)    Types: Cigarettes   Smokeless tobacco: Never  Vaping Use   Vaping status: Former  Substance and Sexual Activity   Alcohol use: Not Currently    Comment: 0   Drug use: Yes    Frequency: 1.0 times per week    Types: "Crack" cocaine, Marijuana   Sexual activity: Not Currently  Other Topics Concern   Not on file  Social History Narrative   ** Merged History Encounter **       Social Drivers of Health   Financial Resource Strain: Low Risk  (03/29/2023)   Overall Financial Resource Strain (CARDIA)    Difficulty of Paying Living Expenses: Not hard at all  Food Insecurity: No Food Insecurity (03/28/2023)   Hunger Vital Sign    Worried About Running Out of Food in the Last Year: Never true    Ran Out of Food in the Last Year: Never true  Transportation Needs: Unmet Transportation Needs (04/03/2023)   PRAPARE - Administrator, Civil Service (Medical): Yes    Lack of Transportation (Non-Medical): Yes  Physical Activity: Not on file  Stress: Not on file  Social Connections: Unknown (03/28/2023)   Social Connection and Isolation Panel [NHANES]    Frequency of Communication with Friends and Family: Twice a week    Frequency of Social Gatherings with Friends and Family: Once a week    Attends Religious Services: 1 to 4 times per year    Active Member of [employer] or Organizations: No    Attends Banker Meetings: Never    Marital Status: Patient declined  Intimate Partner Violence: Not At Risk (03/28/2023)   Humiliation, Afraid, Rape, and Kick  questionnaire    Fear of Current or Ex-Partner: No    Emotionally Abused: No    Physically Abused: No    Sexually Abused:  No      Family History  Problem Relation Age of Onset   CAD Mother    Emphysema Father    There were no vitals filed for this visit.  Wt Readings from Last 3 Encounters:  05/01/23 120 lb 6.4 oz (54.6 kg)  04/04/23 121 lb (54.9 kg)  03/30/23 119 lb 11.4 oz (54.3 kg)   Lab Results  Component Value Date   CREATININE 1.06 04/04/2023   CREATININE 0.84 03/29/2023   CREATININE 1.05 03/28/2023   PHYSICAL EXAM:  General: Well appearing. No resp difficulty HEENT: normal Neck: supple, no JVD Cor: Regular rhythm, rate. No rubs, gallops or murmurs Lungs: clear Abdomen: soft, nontender, nondistended. Extremities: no cyanosis, clubbing, rash, 1+ pitting edema in bilateral lower legs Neuro: alert & oriented X 3. Moves all 4 extremities w/o difficulty. Affect pleasant   ECG: NSR with PVC's, HR 83 (unchanged from previous, personally reviewed)   ASSESSMENT & PLAN:  1: Chronic heart failure with reduced ejection fraction- - suspect due to HTN, atrial fibrillation, alcohol use - NYHA class II - euvolemic - weighing daily; reviewed parameters to call about - Echo 01/08/18: EF 65-70%, moderate LVH - Echo 01/26/23: EF 35-40%, severe hypokinesis, RV normal, mild/moderate AR - continue carvedilol 6.25mg  BID - continue jardiance 10mg  daily - continue furosemide 20mg  daily as needed (hasn't taken since discharge) - continue lisinopril 20mg  daily; discussed changing this to entresto at next visit - continue spironolactone 12.5mg  daily - will not make med changes today as he hasn't been taking numerous meds since discharge due to concern about potential duplicates; RN clarified medications with patient - get compression socks and put them on every morning with removal at bedtime - BNP 03/28/23 reviewed and was 507.8  2: HTN- - BP 142/84 - sees PCP at [facility] - BMET 03/09/23 reviewed and showed sodium 137, potassium 4.2, creatinine 0.84 & GFR >60 - BMET today  3: Nonobstructive CAD- -  saw [facility] cardiology during 12/24 admission - cardiology referral placed - continue atorvastatin 20mg  daily - continue clopidogrel 75mg  daily - LDL 01/31/23 reviewed and was 85 - lipo (a) 01/31/23 reviewed and was 232.7 - R/LHC 01/30/23:    1. Mild, nonobstructive coronary artery disease, as detailed below.  Cardiomyopathy appears nonischemic.  Elevated troponin is most likely due to supply demand mismatch in the setting acute respiratory failure with hypoxia and PEA arrest.    2. Mildly elevated left and right heart filling pressures.    3. Mildly elevated pulmonary artery pressure.    4. Normal Fick cardiac output/index.    5. Apparent short segment occlusion of the right radial artery.  Question thrombosis of the basilic vein.  4: Atrial fibrillation- - EKG NSR with PVC's - continue apixaban 5mg  BID  5: Substance use- - smoking 4 cigarettes / week; was smoking 1 ppd - currently drinking 16 oz beer daily; up until 3 years ago, he was also drinking hard liquor - denies drug use- - discussed making referral for a health coach but he declines at this time - encouraged to continue decreasing alcohol/ smoking usage   Return in 3 weeks, sooner if needed.   Delma Freeze, FNP 05/12/23

## 2023-05-12 NOTE — Telephone Encounter (Signed)
 Patient did not show for his Heart Failure Clinic appointment on 05/12/23.

## 2023-05-24 ENCOUNTER — Ambulatory Visit: Attending: Medical

## 2023-05-24 DIAGNOSIS — I5022 Chronic systolic (congestive) heart failure: Secondary | ICD-10-CM | POA: Diagnosis not present

## 2023-05-24 LAB — ECHOCARDIOGRAM LIMITED
Calc EF: 56.4 %
P 1/2 time: 367 ms
S' Lateral: 3.1 cm
Single Plane A2C EF: 55.7 %
Single Plane A4C EF: 55.7 %

## 2023-06-05 DIAGNOSIS — I491 Atrial premature depolarization: Secondary | ICD-10-CM | POA: Diagnosis not present

## 2023-06-06 ENCOUNTER — Telehealth: Payer: Self-pay | Admitting: *Deleted

## 2023-06-06 ENCOUNTER — Telehealth: Payer: Self-pay | Admitting: Family

## 2023-06-06 NOTE — Telephone Encounter (Signed)
 Called to r/s missed appt on 05/12/23

## 2023-06-06 NOTE — Telephone Encounter (Signed)
-----   Message from Cadence Rebekah Canada sent at 06/06/2023  1:13 PM EDT ----- Patient heart monitor showed normal sinus rhythm with an average heart rate of 86 bpm.  8 runs of nonsustained VT longest lasting 8 beats, 27 runs of SVT, longest lasting 9.8 seconds.  Occasional ectopy noted.  I would increase Coreg  to 12.5 mg twice daily

## 2023-06-06 NOTE — Telephone Encounter (Signed)
No answer/Voicemail box is full.  

## 2023-06-09 MED ORDER — CARVEDILOL 12.5 MG PO TABS: 12.5000 mg | ORAL_TABLET | Freq: Two times a day (BID) | ORAL | 3 refills | Status: AC

## 2023-06-09 NOTE — Telephone Encounter (Signed)
 Spoke with patient and reviewed results and recommendations. Updated prescription sent to [facility] per his request. He verbalized understanding of our conversation, medication change, and had no further questions at this time.

## 2023-06-12 ENCOUNTER — Other Ambulatory Visit: Payer: Self-pay | Admitting: Emergency Medicine

## 2023-07-31 ENCOUNTER — Ambulatory Visit: Attending: Medical | Admitting: Medical

## 2023-07-31 NOTE — Progress Notes (Deleted)
  Cardiology Office Note   Date:  07/31/2023  ID:  Christopher DELENA Yetta Mickey., DOB Aug 03, 1960, MRN [id number] [facility]: [facility]   HeartCare Providers Cardiologist:  Evalene Lunger, MD { Click to update primary MD,subspecialty MD or APP then REFRESH:1}    History of Present Illness Christopher Bryan. is a 63 y.o. male  with a history of systolic heart failure, NICM, atrial fibrillation,  nonobstructive CAD, tobacco abuse, PAD, hypertension, history of DVT/PE, cocaine abuse who is being seen for hospital follow-up.   Patient has had a few hospitalizations/ER visits in the last few months. He regularly sees the [facility].   The patient was admitted in December 2024 with acute on chronic respiratory failure with hypoxic and hypercapnic respiratory failure, COPD exacerbation, adenovirus, status post PEA arrest x 2 with elevated high-sensitivity troponin, acute systolic heart failure, sepsis due to pneumonia, polysubstance abuse with UDS positive for cocaine, and AKI.  Cardiac cath showed nonobstructive left circumflex disease.  Echo showed EF 35 to 40% with regional wall motion abnormalities.  Patient was started on GDMT.  Patient was continue on Eliquis  for arterial and venous thromboembolism/PAD/Afib.  Aspirin  was stopped and Plavix  was restarted.   The patient was admitted to 2/24-2/27 with acute respiratory failure with hypoxia and hypercapnia secondary to COPD exacerbation and heart failure exacerbation started on Solu-Medrol  and BiPAP.  The patient was last seen 20 2125 reporting general fatigue and shortness of breath on exertion.  Patient reported cocaine use on 04/04/23.  Patient was started on Entresto  and limited echo ordered.  Echo showed LVEF 55 to 60%, wall motion abnormality, moderate LVH, normal RV function.  Heart monitor for PVCs showed normal sinus rhythm with an average heart rate of 86 bpm, left 8 runs of nonsustained VT, longest lasting 8 beats, 27 runs of SVT, longest  lasting 9.8 seconds, isolated SVEs were occasional 2.5%, isolated Ves 7.2%. no triggered events.   ROS: ***  Studies Reviewed      *** Risk Assessment/Calculations {Does this patient have ATRIAL FIBRILLATION?[phone]} No BP recorded.  {Refresh Note OR Click here to enter BP  :1}***       Physical Exam VS:  There were no vitals taken for this visit.       Wt Readings from Last 3 Encounters:  05/01/23 120 lb 6.4 oz (54.6 kg)  04/04/23 121 lb (54.9 kg)  03/30/23 119 lb 11.4 oz (54.3 kg)    GEN: Well nourished, well developed in no acute distress NECK: No JVD; No carotid bruits CARDIAC: ***RRR, no murmurs, rubs, gallops RESPIRATORY:  Clear to auscultation without rales, wheezing or rhonchi  ABDOMEN: Soft, non-tender, non-distended EXTREMITIES:  No edema; No deformity   ASSESSMENT AND PLAN ***    {Are you ordering a CV Procedure (e.g. stress test, cath, DCCV, TEE, etc)?   Press F2        :[id number]}  Dispo: ***  Signed, Christopher Treichler VEAR Fishman, PA-C

## 2023-08-04 ENCOUNTER — Emergency Department

## 2023-08-04 ENCOUNTER — Other Ambulatory Visit: Payer: Self-pay

## 2023-08-04 ENCOUNTER — Emergency Department
Admission: EM | Admit: 2023-08-04 | Discharge: 2023-08-04 | Disposition: A | Discharge status: Home / Self Care | Attending: Emergency Medicine | Admitting: Emergency Medicine

## 2023-08-04 DIAGNOSIS — I110 Hypertensive heart disease with heart failure: Secondary | ICD-10-CM | POA: Insufficient documentation

## 2023-08-04 DIAGNOSIS — I509 Heart failure, unspecified: Secondary | ICD-10-CM | POA: Diagnosis not present

## 2023-08-04 DIAGNOSIS — R0602 Shortness of breath: Secondary | ICD-10-CM | POA: Diagnosis present

## 2023-08-04 DIAGNOSIS — J441 Chronic obstructive pulmonary disease with (acute) exacerbation: Secondary | ICD-10-CM | POA: Insufficient documentation

## 2023-08-04 DIAGNOSIS — D72829 Elevated white blood cell count, unspecified: Secondary | ICD-10-CM | POA: Diagnosis not present

## 2023-08-04 LAB — COMPREHENSIVE METABOLIC PANEL WITH GFR
ALT: 11 U/L (ref 0–44)
AST: 18 U/L (ref 15–41)
Albumin: 3.8 g/dL (ref 3.5–5.0)
Alkaline Phosphatase: 76 U/L (ref 38–126)
Anion gap: 8 (ref 5–15)
BUN: 14 mg/dL (ref 8–23)
CO2: 25 mmol/L (ref 22–32)
Calcium: 8.8 mg/dL — ABNORMAL LOW (ref 8.9–10.3)
Chloride: 106 mmol/L (ref 98–111)
Creatinine, Ser: 1.05 mg/dL (ref 0.61–1.24)
GFR, Estimated: >60 mL/min (ref >60)
Glucose, Bld: 90 mg/dL (ref 70–99)
Potassium: 3.9 mmol/L (ref 3.5–5.1)
Sodium: 139 mmol/L (ref 135–145)
Total Bilirubin: 0.6 mg/dL (ref 0.0–1.2)
Total Protein: 6.8 g/dL (ref 6.5–8.1)

## 2023-08-04 LAB — CBC WITH DIFFERENTIAL/PLATELET
Abs Immature Granulocytes: 0.01 K/uL (ref 0.00–0.07)
Basophils Absolute: 0 K/uL (ref 0.0–0.1)
Basophils Relative: 0 %
Eosinophils Absolute: 0.1 K/uL (ref 0.0–0.5)
Eosinophils Relative: 2 %
HCT: 40.4 % (ref 39.0–52.0)
Hemoglobin: 13.1 g/dL (ref 13.0–17.0)
Immature Granulocytes: 0 %
Lymphocytes Relative: 54 %
Lymphs Abs: 2.1 K/uL (ref 0.7–4.0)
MCH: 28.7 pg (ref 26.0–34.0)
MCHC: 32.4 g/dL (ref 30.0–36.0)
MCV: 88.4 fL (ref 80.0–100.0)
Monocytes Absolute: 0.5 K/uL (ref 0.1–1.0)
Monocytes Relative: 12 %
Neutro Abs: 1.2 K/uL — ABNORMAL LOW (ref 1.7–7.7)
Neutrophils Relative %: 32 %
Platelets: 148 K/uL — ABNORMAL LOW (ref 150–400)
RBC: 4.57 MIL/uL (ref 4.22–5.81)
RDW: 14 % (ref 11.5–15.5)
WBC: 3.9 K/uL — ABNORMAL LOW (ref 4.0–10.5)
nRBC: 0 % (ref 0.0–0.2)

## 2023-08-04 LAB — TROPONIN I (HIGH SENSITIVITY): Troponin I (High Sensitivity): 12 ng/L (ref <18)

## 2023-08-04 MED ORDER — PREDNISONE 50 MG PO TABS
50.0000 mg | ORAL_TABLET | Freq: Every day | ORAL | 0 refills | Status: AC
Start: 2023-08-05 — End: 2023-08-09

## 2023-08-04 MED ORDER — PREDNISONE 20 MG PO TABS
60.0000 mg | ORAL_TABLET | Freq: Once | ORAL | Status: AC
  Administered 2023-08-04: 60 mg via ORAL
  Filled 2023-08-04: qty 3

## 2023-08-04 MED ORDER — IPRATROPIUM-ALBUTEROL 0.5-2.5 (3) MG/3ML IN SOLN
3.0000 mL | Freq: Once | RESPIRATORY_TRACT | Status: AC
  Administered 2023-08-04: 3 mL via RESPIRATORY_TRACT
  Filled 2023-08-04: qty 3

## 2023-08-04 NOTE — ED Notes (Signed)
 Oxygen turned off per MD

## 2023-08-04 NOTE — ED Provider Notes (Signed)
 [facility] Provider Note    Event Date/Time   First MD Initiated Contact with Patient 08/04/23 1728     (approximate)   History   Shortness of Breath   HPI  Christopher Bryan. is a 63 y.o. male with a history of COPD, CHF, PAD, hypertension, cocaine abuse, who presents with acute onset of shortness of breath today after being outside.  The patient states that he took a breathing treatment at home although it did not immediately help.  He was put on oxygen by EMS.  He states that his symptoms are now significantly better.  He denies associated chest pain.  He has not had any increased cough and has no fever.  He has no leg swelling.  I reviewed the past medical records.  The patient was admitted to the hospitalist service in February with acute respiratory failure requiring supplemental oxygen, thought to be due to CHF and COPD exacerbations.   Physical Exam   Triage Vital Signs: ED Triage Vitals  Encounter Vitals Group     BP 08/04/23 1353 (!) 128/51     Girls Systolic BP Percentile --      Girls Diastolic BP Percentile --      Boys Systolic BP Percentile --      Boys Diastolic BP Percentile --      Pulse Rate 08/04/23 1353 62     Resp 08/04/23 1353 20     Temp 08/04/23 1353 97.8 F (36.6 C)     Temp Source 08/04/23 1353 Oral     SpO2 08/04/23 1353 95 %     Weight 08/04/23 1351 121 lb (54.9 kg)     Height 08/04/23 1351 5' 8 (1.727 m)     Head Circumference --      Peak Flow --      Pain Score 08/04/23 1351 0     Pain Loc --      Pain Education --      Exclude from Growth Chart --     Most recent vital signs: Vitals:   08/04/23 2145 08/04/23 2151  BP:  98/65  Pulse: 63 69  Resp:  17  Temp:  98.3 F (36.8 C)  SpO2: 95% 94%     General: Alert, well-appearing, no distress.  CV:  Good peripheral perfusion.  Resp:  Normal effort.  Slightly prolonged expiratory phase with no significant wheezing or rales. Abd:  No distention.   Other:  No peripheral edema.   ED Results / Procedures / Treatments   Labs (all labs ordered are listed, but only abnormal results are displayed) Labs Reviewed  CBC WITH DIFFERENTIAL/PLATELET - Abnormal; Notable for the following components:      Result Value   WBC 3.9 (*)    Platelets 148 (*)    Neutro Abs 1.2 (*)    All other components within normal limits  COMPREHENSIVE METABOLIC PANEL WITH GFR - Abnormal; Notable for the following components:   Calcium  8.8 (*)    All other components within normal limits  TROPONIN I (HIGH SENSITIVITY)     EKG  ED ECG REPORT I, Waylon Cassis, the attending physician, personally viewed and interpreted this ECG.  Date: 08/04/2023 EKG Time: 1353 Rate: 58 Rhythm: normal sinus rhythm QRS Axis: normal Intervals: normal ST/T Wave abnormalities: LVH with repolarization abnormality Narrative Interpretation: no evidence of acute ischemia   RADIOLOGY  Chest x-ray: I independently viewed and interpreted the images; there is hyperinflation with no focal  consolidation or edema  PROCEDURES:  Critical Care performed: No  Procedures   MEDICATIONS ORDERED IN ED: Medications  ipratropium-albuterol  (DUONEB) 0.5-2.5 (3) MG/3ML nebulizer solution 3 mL (3 mLs Nebulization Given 08/04/23 1839)  ipratropium-albuterol  (DUONEB) 0.5-2.5 (3) MG/3ML nebulizer solution 3 mL (3 mLs Nebulization Given 08/04/23 1838)  predniSONE  (DELTASONE ) tablet 60 mg (60 mg Oral Given 08/04/23 1837)     IMPRESSION / MDM / ASSESSMENT AND PLAN / ED COURSE  I reviewed the triage vital signs and the nursing notes.  63 year old male with PMH as noted above presents with acute onset of shortness of breath this afternoon, now somewhat improved.  On exam the vital signs are normal.  O2 saturation is ranging from the high 80s to low 90s on room air.  There is no significant wheezing.  Differential diagnosis includes, but is not limited to, COPD exacerbation, acute  bronchitis, bronchospasm, bacterial pneumonia, less likely CHF exacerbation or other cardiac cause.  Chest x-ray shows no edema or consolidation.  CBC shows no leukocytosis.  CMP is unremarkable.  Troponin is negative.  We will give bronchodilators, steroid, and reassess.  If the patient is not requiring oxygen, anticipate he may be able to go home.  Patient's presentation is most consistent with acute complicated illness / injury requiring diagnostic workup.  The patient is on the cardiac monitor to evaluate for evidence of arrhythmia and/or significant heart rate changes.  ----------------------------------------- 9:45 PM on 08/04/2023 -----------------------------------------  The patient continues to feel well and has no acute shortness of breath.  I took him back off of oxygen and his O2 saturation has remained in the mid 90s on room air.  He feels comfortable, wants to go home, and is stable for discharge.  I have prescribed prednisone .  The patient has albuterol .  I gave strict return precautions, and he expressed understanding.   FINAL CLINICAL IMPRESSION(S) / ED DIAGNOSES   Final diagnoses:  COPD exacerbation (HCC)     Rx / DC Orders   ED Discharge Orders          Ordered    predniSONE  (DELTASONE ) 50 MG tablet  Daily        08/04/23 2144             Note:  This document was prepared using Dragon voice recognition software and may include unintentional dictation errors.    Jacolyn Pae, MD 08/04/23 [phone]

## 2023-08-04 NOTE — Discharge Instructions (Signed)
 Take the prednisone  daily for the next 4 days.  Use your albuterol  via inhaler or nebulizer every 4-6 hours for the next few days.  Follow-up with your regular doctor.  Return to the ER for new, worsening, or persistent severe shortness of breath, chest pain, fever, weakness, or any other new or worsening symptoms that concern you.

## 2023-08-04 NOTE — ED Notes (Signed)
 Patient given snack and beverage. No other needs/concerns voiced at this time.

## 2023-08-04 NOTE — ED Triage Notes (Signed)
 Arrived by [facility] for difficulty breathing from home. Symptoms X1 hour  EMS vitals: 92% RA and placed on 2L O2 100% Wheezing noted 145/70 b/p 72HR A&O x4 per EMS

## 2023-08-04 NOTE — ED Notes (Signed)
 Lights dimmed

## 2023-08-04 NOTE — ED Triage Notes (Signed)
 Pt arrives via [facility] for SOB. Pt has hx of COPD. Pt takes 3 inhalers, but is not on home oxygen. Pt reports onset of dyspnea 1 hour ago after coming in from outside. Pt used home inhalers without relief. No treatment from EMS. Pt reports feeling somewhat better.

## 2023-10-19 ENCOUNTER — Encounter: Payer: Self-pay | Admitting: *Deleted

## 2023-10-19 ENCOUNTER — Emergency Department

## 2023-10-19 ENCOUNTER — Inpatient Hospital Stay
Admission: EM | Admit: 2023-10-19 | Discharge: 2023-10-23 | DRG: 271 | Disposition: A | Discharge status: Home / Self Care | Attending: Internal Medicine | Admitting: Internal Medicine

## 2023-10-19 ENCOUNTER — Other Ambulatory Visit: Payer: Self-pay

## 2023-10-19 DIAGNOSIS — E785 Hyperlipidemia, unspecified: Secondary | ICD-10-CM | POA: Diagnosis present

## 2023-10-19 DIAGNOSIS — I70222 Atherosclerosis of native arteries of extremities with rest pain, left leg: Secondary | ICD-10-CM | POA: Diagnosis not present

## 2023-10-19 DIAGNOSIS — D72819 Decreased white blood cell count, unspecified: Secondary | ICD-10-CM | POA: Diagnosis present

## 2023-10-19 DIAGNOSIS — Z86711 Personal history of pulmonary embolism: Secondary | ICD-10-CM

## 2023-10-19 DIAGNOSIS — Z7901 Long term (current) use of anticoagulants: Secondary | ICD-10-CM

## 2023-10-19 DIAGNOSIS — I5082 Biventricular heart failure: Secondary | ICD-10-CM | POA: Diagnosis present

## 2023-10-19 DIAGNOSIS — I110 Hypertensive heart disease with heart failure: Secondary | ICD-10-CM | POA: Diagnosis present

## 2023-10-19 DIAGNOSIS — N400 Enlarged prostate without lower urinary tract symptoms: Secondary | ICD-10-CM | POA: Diagnosis present

## 2023-10-19 DIAGNOSIS — Z5982 Transportation insecurity: Secondary | ICD-10-CM

## 2023-10-19 DIAGNOSIS — J449 Chronic obstructive pulmonary disease, unspecified: Secondary | ICD-10-CM | POA: Diagnosis present

## 2023-10-19 DIAGNOSIS — Z91040 Latex allergy status: Secondary | ICD-10-CM

## 2023-10-19 DIAGNOSIS — Z91041 Radiographic dye allergy status: Secondary | ICD-10-CM

## 2023-10-19 DIAGNOSIS — Z7902 Long term (current) use of antithrombotics/antiplatelets: Secondary | ICD-10-CM

## 2023-10-19 DIAGNOSIS — Z825 Family history of asthma and other chronic lower respiratory diseases: Secondary | ICD-10-CM

## 2023-10-19 DIAGNOSIS — Z555 Less than a high school diploma: Secondary | ICD-10-CM

## 2023-10-19 DIAGNOSIS — Z91018 Allergy to other foods: Secondary | ICD-10-CM

## 2023-10-19 DIAGNOSIS — I480 Paroxysmal atrial fibrillation: Secondary | ICD-10-CM | POA: Diagnosis present

## 2023-10-19 DIAGNOSIS — I998 Other disorder of circulatory system: Principal | ICD-10-CM

## 2023-10-19 DIAGNOSIS — I82412 Acute embolism and thrombosis of left femoral vein: Secondary | ICD-10-CM | POA: Diagnosis present

## 2023-10-19 DIAGNOSIS — Z7984 Long term (current) use of oral hypoglycemic drugs: Secondary | ICD-10-CM

## 2023-10-19 DIAGNOSIS — I5032 Chronic diastolic (congestive) heart failure: Secondary | ICD-10-CM | POA: Diagnosis present

## 2023-10-19 DIAGNOSIS — Z8249 Family history of ischemic heart disease and other diseases of the circulatory system: Secondary | ICD-10-CM

## 2023-10-19 DIAGNOSIS — Z86718 Personal history of other venous thrombosis and embolism: Secondary | ICD-10-CM

## 2023-10-19 DIAGNOSIS — Z7951 Long term (current) use of inhaled steroids: Secondary | ICD-10-CM

## 2023-10-19 DIAGNOSIS — Z79899 Other long term (current) drug therapy: Secondary | ICD-10-CM

## 2023-10-19 DIAGNOSIS — F17210 Nicotine dependence, cigarettes, uncomplicated: Secondary | ICD-10-CM | POA: Diagnosis present

## 2023-10-19 DIAGNOSIS — K219 Gastro-esophageal reflux disease without esophagitis: Secondary | ICD-10-CM | POA: Diagnosis present

## 2023-10-19 MED ORDER — ONDANSETRON HCL 4 MG/2ML IJ SOLN
4.0000 mg | Freq: Once | INTRAMUSCULAR | Status: AC
  Administered 2023-10-20: 4 mg via INTRAVENOUS
  Filled 2023-10-19: qty 2

## 2023-10-19 MED ORDER — MORPHINE SULFATE (PF) 4 MG/ML IV SOLN
4.0000 mg | Freq: Once | INTRAVENOUS | Status: AC
  Administered 2023-10-20: 4 mg via INTRAVENOUS
  Filled 2023-10-19: qty 1

## 2023-10-19 NOTE — ED Triage Notes (Signed)
 Pt to triage via wheelchair.  Pt has left lower leg pain.  Sx for 3 days.  Hx blood clots.  Pt went out of town and forgot his blood thinners x 4 days.  Pt now believes he has a clot.  Pt alert  speech clear.

## 2023-10-19 NOTE — ED Provider Notes (Signed)
 [facility] Provider Note    Event Date/Time   First MD Initiated Contact with Patient 10/19/23 2344     (approximate)   History   Leg Pain   HPI  Christopher Bryan. is a 63 y.o. male with history of atrial fibrillation, CHF, COPD, prior PE and DVT on Eliquis , peripheral arterial disease on Plavix  who presents to the emergency department with increased left leg pain, numbness and feeling like his leg was cool to touch that started 4 days ago.  States he was out of town and did not take his Plavix  or Eliquis  for several days.  He states on Tuesday his symptoms started and have progressively worsened.  Denies any injury to the leg.  No calf swelling.  Always has some shortness of breath due to his COPD but denies any new shortness of breath or chest pain.  No fever.   History provided by patient.    Past Medical History:  Diagnosis Date   A-fib [facility])    CHF (congestive heart failure) (HCC)    COPD (chronic obstructive pulmonary disease) (HCC)    DVT (deep venous thrombosis) (HCC)    Hypertension    Peripheral arterial disease (HCC)    Right-sided heart failure (HCC)    Smoker     Past Surgical History:  Procedure Laterality Date   KNEE ARTHROSCOPY Left    LOWER EXTREMITY ANGIOGRAPHY Left 01/21/2020   Procedure: Lower Extremity Angiography;  Surgeon: Jama Cordella MATSU, MD;  Location: [facility] INVASIVE CV LAB;  Service: Cardiovascular;  Laterality: Left;   LOWER EXTREMITY ANGIOGRAPHY Left 09/08/2021   Procedure: Lower Extremity Angiography;  Surgeon: Marea Selinda RAMAN, MD;  Location: [facility] INVASIVE CV LAB;  Service: Cardiovascular;  Laterality: Left;   LOWER EXTREMITY ANGIOGRAPHY Left 06/20/2022   Procedure: Lower Extremity Angiography;  Surgeon: Marea Selinda RAMAN, MD;  Location: [facility] INVASIVE CV LAB;  Service: Cardiovascular;  Laterality: Left;   ORIF FEMUR FRACTURE Right    RIGHT/LEFT HEART CATH AND CORONARY ANGIOGRAPHY N/A 01/30/2023   Procedure: RIGHT/LEFT  HEART CATH AND CORONARY ANGIOGRAPHY;  Surgeon: Mady Bruckner, MD;  Location: [facility] INVASIVE CV LAB;  Service: Cardiovascular;  Laterality: N/A;    MEDICATIONS:  Prior to Admission medications   Medication Sig Start Date End Date Taking? Authorizing Provider  albuterol  (PROVENTIL  HFA;VENTOLIN  HFA) 108 (90 Base) MCG/ACT inhaler Inhale 2 puffs into the lungs every 6 (six) hours as needed for wheezing or shortness of breath.    [provider]  apixaban  (ELIQUIS ) 5 MG TABS tablet Take 1 tablet (5 mg total) by mouth 2 (two) times daily. 06/24/22   Brown, Fallon E, NP  atorvastatin  (LIPITOR ) 40 MG tablet Take 20 mg by mouth at bedtime.    [provider]  azithromycin  (ZITHROMAX ) 250 MG tablet Take 1 tablet (250 mg total) by mouth daily for 2 days. 03/31/23   Josette Ade, MD  carboxymethylcellulose (REFRESH PLUS) 0.5 % SOLN Place 1 drop into both eyes 4 (four) times daily as needed. 09/09/21   Josette Ade, MD  carvedilol  (COREG ) 12.5 MG tablet Take 1 tablet (12.5 mg total) by mouth 2 (two) times daily. 06/09/23 09/07/23  Furth, Cadence H, PA-C  cholecalciferol  (VITAMIN D ) 1000 units tablet Take 2,000 Units by mouth daily.    [provider]  clopidogrel  (PLAVIX ) 75 MG tablet Take 1 tablet (75 mg total) by mouth daily. 09/10/21   Josette Ade, MD  diclofenac  Sodium (VOLTAREN ) 1 % GEL Apply 2 g topically  3 (three) times daily as needed.    [provider]  empagliflozin  (JARDIANCE ) 10 MG TABS tablet Take 1 tablet (10 mg total) by mouth daily before breakfast. 03/30/23   Josette Ade, MD  fluticasone-salmeterol (ADVAIR) 250-50 MCG/ACT AEPB Inhale 1 puff into the lungs in the morning and at bedtime.    [provider]  furosemide  (LASIX ) 20 MG tablet Take 1 tablet (20 mg total) by mouth daily as needed for edema (weight gain of 3 lbs in a day or 5 lbs in a week). 03/30/23   Josette Ade, MD  gabapentin  (NEURONTIN ) 300 MG capsule Take 600 mg by mouth  3 (three) times daily. TAKE TWO CAPSULES BY MOUTH THREE TIMES A DAY *START WITH 1 CAPSULE THREE TIMES A DAY AND INCREASE BY 1 CAPSULE EVERY 3 DAYS UNTIL TAKING TWO CAPSULES THREE TIMES A DAY 03/23/23   [provider]  lidocaine  (LIDODERM ) 5 % Place 1 patch onto the skin daily. Remove & Discard patch within 12 hours or as directed by MD    [provider]  melatonin 3 MG TABS tablet Take 3 mg by mouth at bedtime.    [provider]  methocarbamol  (ROBAXIN ) 500 MG tablet Take 500 mg by mouth every 8 (eight) hours as needed for muscle spasms.    [provider]  mirtazapine  (REMERON ) 7.5 MG tablet Take 7.5 mg by mouth at bedtime.    [provider]  nicotine  polacrilex (COMMIT) 4 MG lozenge Take 4 mg by mouth as needed. DISSOLVE 1 LOZENGE BY MOUTH AS DIRECTED FOR SMOKING CESSATION USE AT LEAST 8-10 LOZENGES EACH DAY TO START. DO NOT USE MORE THAN 20 LOZENGES IN A DAY. DO NOT EAT OR DRINK FOR 15 MINUTES BEFORE AND DURING USE 12/26/22   [provider]  pantoprazole  (PROTONIX ) 20 MG tablet Take 20 mg by mouth daily.    [provider]  polyethylene glycol powder (GLYCOLAX /MIRALAX ) 17 GM/SCOOP powder Take 17 g by mouth daily as needed for moderate constipation. 03/30/23   Josette Ade, MD  sacubitril -valsartan  (ENTRESTO ) 24-26 MG Take 1 tablet by mouth 2 (two) times daily. 05/01/23   Furth, Cadence H, PA-C  sennosides-docusate sodium  (SENOKOT-S) 8.6-50 MG tablet Take 1-2 tablets by mouth at bedtime as needed for constipation.    [provider]  sildenafil  (VIAGRA ) 100 MG tablet Take 100 mg by mouth daily as needed for erectile dysfunction.    [provider]  spironolactone  (ALDACTONE ) 25 MG tablet Take 0.5 tablets (12.5 mg total) by mouth daily. 03/31/23   Josette Ade, MD  tiotropium (SPIRIVA ) 18 MCG inhalation capsule Place 18 mcg into inhaler and inhale daily.    [provider]    Physical Exam   Triage  Vital Signs: ED Triage Vitals  Encounter Vitals Group     BP 10/19/23 2104 (!) 169/76     Girls Systolic BP Percentile --      Girls Diastolic BP Percentile --      Boys Systolic BP Percentile --      Boys Diastolic BP Percentile --      Pulse Rate 10/19/23 2104 87     Resp 10/19/23 2104 20     Temp 10/19/23 2104 98.1 F (36.7 C)     Temp Source 10/19/23 2104 Oral     SpO2 10/19/23 2104 97 %     Weight 10/19/23 2105 120 lb (54.4 kg)     Height 10/19/23 2105 5' 8 (1.727 m)  Head Circumference --      Peak Flow --      Pain Score 10/19/23 2105 9     Pain Loc --      Pain Education --      Exclude from Growth Chart --     Most recent vital signs: Vitals:   10/19/23 2104 10/20/23 0220  BP: (!) 169/76   Pulse: 87   Resp: 20   Temp: 98.1 F (36.7 C) 97.8 F (36.6 C)  SpO2: 97%     CONSTITUTIONAL: Alert, responds appropriately to questions.  Chronically ill-appearing HEAD: Normocephalic, atraumatic EYES: Conjunctivae clear, pupils appear equal, sclera nonicteric ENT: normal nose; moist mucous membranes NECK: Supple, normal ROM CARD: RRR; S1 and S2 appreciated RESP: Normal chest excursion without splinting or tachypnea; breath sounds clear and equal bilaterally; no wheezes, no rhonchi, no rales, no hypoxia or respiratory distress, speaking full sentences ABD/GI: Non-distended; soft, non-tender, no rebound, no guarding, no peritoneal signs BACK: The back appears normal EXT: I am able to Doppler a 2+ DP and PT pulse in the right lower extremity but unable to locate distal pulses in the left lower extremity.  He does have a monophasic dopplerable popliteal pulse of the left leg.  No calf swelling.  Left foot is cool to touch compared to the right.  No induration, crepitus.  No bony abnormality.  No joint effusions noted.  Diminished capillary refill in the left toes.  Diminished sensation in the left distal lower extremity. SKIN: Normal color for age and race; warm; no rash on  exposed skin NEURO: Moves all extremities equally, normal speech PSYCH: The patient's mood and manner are appropriate.   ED Results / Procedures / Treatments   LABS: (all labs ordered are listed, but only abnormal results are displayed) Labs Reviewed  CBC WITH DIFFERENTIAL/PLATELET - Abnormal; Notable for the following components:      Result Value   WBC 3.2 (*)    Hemoglobin 12.8 (*)    Neutro Abs 0.8 (*)    All other components within normal limits  BASIC METABOLIC PANEL WITH GFR - Abnormal; Notable for the following components:   Sodium 146 (*)    Glucose, Bld 122 (*)    All other components within normal limits  PROTIME-INR  APTT  HEPARIN  LEVEL (UNFRACTIONATED)  BASIC METABOLIC PANEL WITH GFR  CBC  PATHOLOGIST SMEAR REVIEW  APTT     EKG:  EKG Interpretation Date/Time:  Friday October 20 2023 00:19:58 EDT Ventricular Rate:  75 PR Interval:  151 QRS Duration:  101 QT Interval:  394 QTC Calculation: 441 R Axis:   53  Text Interpretation: Sinus rhythm LVH with secondary repolarization abnormality Anterior infarct, old No significant change since last tracing Confirmed by Neomi Neptune [phone]) on 10/20/2023 12:24:43 AM         RADIOLOGY: My personal review and interpretation of imaging:    I have personally reviewed all radiology reports.   No results found.   PROCEDURES:  Critical Care performed: Yes, see critical care procedure note(s)   CRITICAL CARE Performed by: Neptune Neomi   Total critical care time: 30 minutes  Critical care time was exclusive of separately billable procedures and treating other patients.  Critical care was necessary to treat or prevent imminent or life-threatening deterioration.  Critical care was time spent personally by me on the following activities: development of treatment plan with patient and/or surrogate as well as nursing, discussions with consultants, evaluation of patient's response to treatment,  examination of  patient, obtaining history from patient or surrogate, ordering and performing treatments and interventions, ordering and review of laboratory studies, ordering and review of radiographic studies, pulse oximetry and re-evaluation of patient's condition.   SABRA1-3 Lead EKG Interpretation  Performed by: Jordy Hewins, Josette SAILOR, DO Authorized by: Weslee Fogg, Josette SAILOR, DO     Interpretation: normal     ECG rate:  87   ECG rate assessment: normal     Rhythm: sinus rhythm     Ectopy: none     Conduction: normal       IMPRESSION / MDM / ASSESSMENT AND PLAN / ED COURSE  I reviewed the triage vital signs and the nursing notes.    Patient here with concerns for an ischemic left lower extremity.  The patient is on the cardiac monitor to evaluate for evidence of arrhythmia and/or significant heart rate changes.   DIFFERENTIAL DIAGNOSIS (includes but not limited to):   Ischemic leg secondary to peripheral arterial disease, doubt DVT and cerulea dolens, no sign of compartment syndrome, cellulitis, septic arthritis, gout, fracture   Patient's presentation is most consistent with acute presentation with potential threat to life or bodily function.   PLAN: Will start heparin .  Will obtain labs.  Will discuss with vascular surgery.  Will keep n.p.o. and give pain and nausea medicine.   MEDICATIONS GIVEN IN ED: Medications  heparin  ADULT infusion 100 units/mL (25000 units/250mL) (900 Units/hr Intravenous New Bag/Given 10/20/23 0129)  atorvastatin  (LIPITOR ) tablet 20 mg (has no administration in time range)  carvedilol  (COREG ) tablet 12.5 mg (has no administration in time range)  furosemide  (LASIX ) tablet 20 mg (has no administration in time range)  sacubitril -valsartan  (ENTRESTO ) 24-26 mg per tablet (has no administration in time range)  spironolactone  (ALDACTONE ) tablet 12.5 mg (has no administration in time range)  mirtazapine  (REMERON ) tablet 7.5 mg (7.5 mg Oral Given 10/20/23 0221)  nicotine  polacrilex  (NICORETTE ) gum 4 mg (has no administration in time range)  empagliflozin  (JARDIANCE ) tablet 10 mg (has no administration in time range)  pantoprazole  (PROTONIX ) EC tablet 20 mg (has no administration in time range)  senna-docusate (Senokot-S) tablet 1-2 tablet (has no administration in time range)  clopidogrel  (PLAVIX ) tablet 75 mg (has no administration in time range)  melatonin tablet 2.5 mg (2.5 mg Oral Given 10/20/23 0221)  gabapentin  (NEURONTIN ) capsule 600 mg (has no administration in time range)  methocarbamol  (ROBAXIN ) tablet 500 mg (has no administration in time range)  cholecalciferol  (VITAMIN D3) 25 MCG (1000 UNIT) tablet 2,000 Units (has no administration in time range)  artificial tears ophthalmic solution 1 drop (has no administration in time range)  lidocaine  (LIDODERM ) 5 % 1 patch (has no administration in time range)  0.9 %  sodium chloride  infusion ( Intravenous New Bag/Given 10/20/23 0223)  acetaminophen  (TYLENOL ) tablet 650 mg (has no administration in time range)    Or  acetaminophen  (TYLENOL ) suppository 650 mg (has no administration in time range)  traZODone  (DESYREL ) tablet 25 mg (has no administration in time range)  magnesium  hydroxide (MILK OF MAGNESIA) suspension 30 mL (has no administration in time range)  ondansetron  (ZOFRAN ) tablet 4 mg (has no administration in time range)    Or  ondansetron  (ZOFRAN ) injection 4 mg (has no administration in time range)  polyethylene glycol (MIRALAX  / GLYCOLAX ) packet 17 g (has no administration in time range)  albuterol  (PROVENTIL ) (2.5 MG/3ML) 0.083% nebulizer solution 2.5 mg (has no administration in time range)  umeclidinium bromide  (INCRUSE ELLIPTA ) 62.5 MCG/ACT 1  puff (has no administration in time range)  diclofenac  Sodium (VOLTAREN ) 1 % topical gel 2 g (has no administration in time range)  morphine  (PF) 4 MG/ML injection 4 mg (4 mg Intravenous Given 10/20/23 0020)  ondansetron  (ZOFRAN ) injection 4 mg (4 mg Intravenous  Given 10/20/23 0020)  heparin  bolus via infusion 3,300 Units (3,300 Units Intravenous Bolus from Bag 10/20/23 0139)     ED COURSE: Discussed with Dr. Marea with vascular surgery.  He will see patient in consult today.  Labs show hemoglobin of 12.8.  Normal glucose.  Normal INR.  Will discuss with hospitalist for admission.   CONSULTS:  Consulted and discussed patient's case with hospitalist, Dr. Lawence.  I have recommended admission and consulting physician agrees and will place admission orders.  Patient (and family if present) agree with this plan.   I reviewed all nursing notes, vitals, pertinent previous records.  All labs, EKGs, imaging ordered have been independently reviewed and interpreted by myself.    OUTSIDE RECORDS REVIEWED: Reviewed prior vascular surgery notes.  Patient underwent percutaneous revascularization of the left lower extremity with successful recannulization of the right to left femoral to femoral bypass in May 2024.       FINAL CLINICAL IMPRESSION(S) / ED DIAGNOSES   Final diagnoses:  Ischemic leg     Rx / DC Orders   ED Discharge Orders     None        Note:  This document was prepared using Dragon voice recognition software and may include unintentional dictation errors.   Dauntae Derusha, Josette SAILOR, DO 10/20/23 [phone]

## 2023-10-20 ENCOUNTER — Encounter
Admission: EM | Disposition: A | Discharge status: Home / Self Care | Payer: Self-pay | Source: Home / Self Care | Attending: Internal Medicine

## 2023-10-20 ENCOUNTER — Encounter: Payer: Self-pay | Admitting: Anesthesiology

## 2023-10-20 DIAGNOSIS — I110 Hypertensive heart disease with heart failure: Secondary | ICD-10-CM | POA: Diagnosis present

## 2023-10-20 DIAGNOSIS — Z91041 Radiographic dye allergy status: Secondary | ICD-10-CM | POA: Diagnosis not present

## 2023-10-20 DIAGNOSIS — Z825 Family history of asthma and other chronic lower respiratory diseases: Secondary | ICD-10-CM | POA: Diagnosis not present

## 2023-10-20 DIAGNOSIS — K219 Gastro-esophageal reflux disease without esophagitis: Secondary | ICD-10-CM | POA: Diagnosis present

## 2023-10-20 DIAGNOSIS — D72819 Decreased white blood cell count, unspecified: Secondary | ICD-10-CM | POA: Diagnosis present

## 2023-10-20 DIAGNOSIS — Z5982 Transportation insecurity: Secondary | ICD-10-CM | POA: Diagnosis not present

## 2023-10-20 DIAGNOSIS — Z7902 Long term (current) use of antithrombotics/antiplatelets: Secondary | ICD-10-CM | POA: Diagnosis not present

## 2023-10-20 DIAGNOSIS — T82868A Thrombosis of vascular prosthetic devices, implants and grafts, initial encounter: Secondary | ICD-10-CM | POA: Diagnosis not present

## 2023-10-20 DIAGNOSIS — Z9889 Other specified postprocedural states: Secondary | ICD-10-CM

## 2023-10-20 DIAGNOSIS — Z7951 Long term (current) use of inhaled steroids: Secondary | ICD-10-CM | POA: Diagnosis not present

## 2023-10-20 DIAGNOSIS — I82412 Acute embolism and thrombosis of left femoral vein: Secondary | ICD-10-CM | POA: Diagnosis present

## 2023-10-20 DIAGNOSIS — Z7984 Long term (current) use of oral hypoglycemic drugs: Secondary | ICD-10-CM | POA: Diagnosis not present

## 2023-10-20 DIAGNOSIS — I5032 Chronic diastolic (congestive) heart failure: Secondary | ICD-10-CM | POA: Diagnosis present

## 2023-10-20 DIAGNOSIS — Z7901 Long term (current) use of anticoagulants: Secondary | ICD-10-CM

## 2023-10-20 DIAGNOSIS — F17210 Nicotine dependence, cigarettes, uncomplicated: Secondary | ICD-10-CM | POA: Diagnosis present

## 2023-10-20 DIAGNOSIS — I70222 Atherosclerosis of native arteries of extremities with rest pain, left leg: Secondary | ICD-10-CM

## 2023-10-20 DIAGNOSIS — N400 Enlarged prostate without lower urinary tract symptoms: Secondary | ICD-10-CM

## 2023-10-20 DIAGNOSIS — I700 Atherosclerosis of aorta: Secondary | ICD-10-CM

## 2023-10-20 DIAGNOSIS — Z86711 Personal history of pulmonary embolism: Secondary | ICD-10-CM | POA: Diagnosis not present

## 2023-10-20 DIAGNOSIS — J449 Chronic obstructive pulmonary disease, unspecified: Secondary | ICD-10-CM | POA: Diagnosis present

## 2023-10-20 DIAGNOSIS — I743 Embolism and thrombosis of arteries of the lower extremities: Secondary | ICD-10-CM | POA: Diagnosis not present

## 2023-10-20 DIAGNOSIS — Z8249 Family history of ischemic heart disease and other diseases of the circulatory system: Secondary | ICD-10-CM | POA: Diagnosis not present

## 2023-10-20 DIAGNOSIS — Z86718 Personal history of other venous thrombosis and embolism: Secondary | ICD-10-CM | POA: Diagnosis not present

## 2023-10-20 DIAGNOSIS — E785 Hyperlipidemia, unspecified: Secondary | ICD-10-CM | POA: Diagnosis present

## 2023-10-20 DIAGNOSIS — Z79899 Other long term (current) drug therapy: Secondary | ICD-10-CM

## 2023-10-20 DIAGNOSIS — I480 Paroxysmal atrial fibrillation: Secondary | ICD-10-CM | POA: Diagnosis present

## 2023-10-20 DIAGNOSIS — I998 Other disorder of circulatory system: Secondary | ICD-10-CM | POA: Diagnosis present

## 2023-10-20 DIAGNOSIS — Z91040 Latex allergy status: Secondary | ICD-10-CM | POA: Diagnosis not present

## 2023-10-20 DIAGNOSIS — Z555 Less than a high school diploma: Secondary | ICD-10-CM | POA: Diagnosis not present

## 2023-10-20 DIAGNOSIS — I5082 Biventricular heart failure: Secondary | ICD-10-CM | POA: Diagnosis present

## 2023-10-20 HISTORY — PX: LOWER EXTREMITY ANGIOGRAPHY: CATH118251

## 2023-10-20 LAB — CBC WITH DIFFERENTIAL/PLATELET
Abs Immature Granulocytes: 0.01 K/uL (ref 0.00–0.07)
Basophils Absolute: 0 K/uL (ref 0.0–0.1)
Basophils Relative: 0 %
Eosinophils Absolute: 0.1 K/uL (ref 0.0–0.5)
Eosinophils Relative: 3 %
HCT: 40.8 % (ref 39.0–52.0)
Hemoglobin: 12.8 g/dL — ABNORMAL LOW (ref 13.0–17.0)
Immature Granulocytes: 0 %
Lymphocytes Relative: 61 %
Lymphs Abs: 2 K/uL (ref 0.7–4.0)
MCH: 28.6 pg (ref 26.0–34.0)
MCHC: 31.4 g/dL (ref 30.0–36.0)
MCV: 91.3 fL (ref 80.0–100.0)
Monocytes Absolute: 0.4 K/uL (ref 0.1–1.0)
Monocytes Relative: 11 %
Neutro Abs: 0.8 K/uL — ABNORMAL LOW (ref 1.7–7.7)
Neutrophils Relative %: 25 %
Platelets: 185 K/uL (ref 150–400)
RBC: 4.47 MIL/uL (ref 4.22–5.81)
RDW: 14.3 % (ref 11.5–15.5)
Smear Review: NORMAL
WBC: 3.2 K/uL — ABNORMAL LOW (ref 4.0–10.5)
nRBC: 0 % (ref 0.0–0.2)

## 2023-10-20 LAB — CBC
HCT: 37.8 % — ABNORMAL LOW (ref 39.0–52.0)
Hemoglobin: 12 g/dL — ABNORMAL LOW (ref 13.0–17.0)
MCH: 29.1 pg (ref 26.0–34.0)
MCHC: 31.7 g/dL (ref 30.0–36.0)
MCV: 91.5 fL (ref 80.0–100.0)
Platelets: 178 K/uL (ref 150–400)
RBC: 4.13 MIL/uL — ABNORMAL LOW (ref 4.22–5.81)
RDW: 14.4 % (ref 11.5–15.5)
WBC: 3.4 K/uL — ABNORMAL LOW (ref 4.0–10.5)
nRBC: 0 % (ref 0.0–0.2)

## 2023-10-20 LAB — BASIC METABOLIC PANEL WITH GFR
Anion gap: 5 (ref 5–15)
Anion gap: 8 (ref 5–15)
BUN: 18 mg/dL (ref 8–23)
BUN: 19 mg/dL (ref 8–23)
CO2: 27 mmol/L (ref 22–32)
CO2: 27 mmol/L (ref 22–32)
Calcium: 8.5 mg/dL — ABNORMAL LOW (ref 8.9–10.3)
Calcium: 8.9 mg/dL (ref 8.9–10.3)
Chloride: 111 mmol/L (ref 98–111)
Chloride: 112 mmol/L — ABNORMAL HIGH (ref 98–111)
Creatinine, Ser: 0.98 mg/dL (ref 0.61–1.24)
Creatinine, Ser: 1.13 mg/dL (ref 0.61–1.24)
GFR, Estimated: >60 mL/min (ref >60)
GFR, Estimated: >60 mL/min (ref >60)
Glucose, Bld: 122 mg/dL — ABNORMAL HIGH (ref 70–99)
Glucose, Bld: 129 mg/dL — ABNORMAL HIGH (ref 70–99)
Potassium: 3.7 mmol/L (ref 3.5–5.1)
Potassium: 3.8 mmol/L (ref 3.5–5.1)
Sodium: 144 mmol/L (ref 135–145)
Sodium: 146 mmol/L — ABNORMAL HIGH (ref 135–145)

## 2023-10-20 LAB — PROTIME-INR
INR: 1 (ref 0.8–1.2)
Prothrombin Time: 13.3 s (ref 11.4–15.2)

## 2023-10-20 LAB — APTT
aPTT: 28 s (ref 24–36)
aPTT: 93 s — ABNORMAL HIGH (ref 24–36)

## 2023-10-20 LAB — PATHOLOGIST SMEAR REVIEW

## 2023-10-20 LAB — HEPARIN LEVEL (UNFRACTIONATED): Heparin Unfractionated: 0.52 [IU]/mL (ref 0.30–0.70)

## 2023-10-20 SURGERY — LOWER EXTREMITY ANGIOGRAPHY
Anesthesia: Moderate Sedation | Laterality: Left

## 2023-10-20 SURGERY — CREATION, BYPASS, ARTERIAL, FEMORAL TO TIBIAL, USING GRAFT
Anesthesia: Choice

## 2023-10-20 MED ORDER — POLYETHYLENE GLYCOL 3350 17 GM/SCOOP PO POWD: 17.0000 g | Freq: Every day | ORAL | Status: DC | PRN

## 2023-10-20 MED ORDER — CLOPIDOGREL BISULFATE 75 MG PO TABS
75.0000 mg | ORAL_TABLET | Freq: Every day | ORAL | Status: DC
  Administered 2023-10-20 – 2023-10-23 (×4): 75 mg via ORAL
  Filled 2023-10-20 (×4): qty 1

## 2023-10-20 MED ORDER — HYDROMORPHONE HCL 1 MG/ML IJ SOLN
1.0000 mg | INTRAMUSCULAR | Status: DC | PRN
  Administered 2023-10-23: 1 mg via INTRAVENOUS
  Filled 2023-10-20: qty 1

## 2023-10-20 MED ORDER — NICOTINE POLACRILEX 2 MG MT GUM: 4.0000 mg | CHEWING_GUM | OROMUCOSAL | Status: DC | PRN

## 2023-10-20 MED ORDER — TRAZODONE HCL 50 MG PO TABS: 25.0000 mg | ORAL_TABLET | Freq: Every evening | ORAL | Status: DC | PRN

## 2023-10-20 MED ORDER — MIDAZOLAM HCL 2 MG/2ML IJ SOLN
INTRAMUSCULAR | Status: DC | PRN
  Administered 2023-10-20 (×2): .5 mg via INTRAVENOUS
  Administered 2023-10-20: 2 mg via INTRAVENOUS
  Administered 2023-10-20: 1 mg via INTRAVENOUS

## 2023-10-20 MED ORDER — TIOTROPIUM BROMIDE MONOHYDRATE 18 MCG IN CAPS: 18.0000 ug | ORAL_CAPSULE | Freq: Every day | RESPIRATORY_TRACT | Status: DC

## 2023-10-20 MED ORDER — SODIUM CHLORIDE 0.9 % IV SOLN: INTRAVENOUS | Status: AC

## 2023-10-20 MED ORDER — GABAPENTIN 300 MG PO CAPS
600.0000 mg | ORAL_CAPSULE | Freq: Three times a day (TID) | ORAL | Status: DC
  Administered 2023-10-20 – 2023-10-23 (×9): 600 mg via ORAL
  Filled 2023-10-20 (×10): qty 2

## 2023-10-20 MED ORDER — MIDAZOLAM HCL 2 MG/ML PO SYRP: 8.0000 mg | ORAL_SOLUTION | Freq: Once | ORAL | Status: DC | PRN

## 2023-10-20 MED ORDER — HEPARIN 30,000 UNITS/1000 ML (OHS) CELLSAVER SOLUTION
Status: AC
  Filled 2023-10-20: qty 1000

## 2023-10-20 MED ORDER — MIDAZOLAM HCL 5 MG/5ML IJ SOLN
INTRAMUSCULAR | Status: AC
  Filled 2023-10-20: qty 5

## 2023-10-20 MED ORDER — CARVEDILOL 12.5 MG PO TABS
12.5000 mg | ORAL_TABLET | Freq: Two times a day (BID) | ORAL | Status: DC
  Administered 2023-10-20 – 2023-10-23 (×7): 12.5 mg via ORAL
  Filled 2023-10-20: qty 1
  Filled 2023-10-20: qty 2
  Filled 2023-10-20 (×5): qty 1

## 2023-10-20 MED ORDER — STERILE WATER FOR INJECTION IJ SOLN
INTRAMUSCULAR | Status: AC
  Filled 2023-10-20: qty 10

## 2023-10-20 MED ORDER — HEPARIN SODIUM (PORCINE) 1000 UNIT/ML IJ SOLN
INTRAMUSCULAR | Status: DC | PRN
  Administered 2023-10-20: 5000 [IU] via INTRAVENOUS
  Administered 2023-10-20: 2000 [IU] via INTRAVENOUS

## 2023-10-20 MED ORDER — IODIXANOL 320 MG/ML IV SOLN
INTRAVENOUS | Status: DC | PRN
  Administered 2023-10-20: 60 mL

## 2023-10-20 MED ORDER — ATORVASTATIN CALCIUM 20 MG PO TABS
20.0000 mg | ORAL_TABLET | Freq: Every day | ORAL | Status: DC
Start: 2023-10-20 — End: 2023-10-23
  Administered 2023-10-20 – 2023-10-22 (×3): 20 mg via ORAL
  Filled 2023-10-20 (×3): qty 1

## 2023-10-20 MED ORDER — POLYETHYLENE GLYCOL 3350 17 G PO PACK: 17.0000 g | PACK | Freq: Every day | ORAL | Status: DC | PRN

## 2023-10-20 MED ORDER — ALBUTEROL SULFATE HFA 108 (90 BASE) MCG/ACT IN AERS: 2.0000 | INHALATION_SPRAY | Freq: Four times a day (QID) | RESPIRATORY_TRACT | Status: DC | PRN

## 2023-10-20 MED ORDER — METHOCARBAMOL 500 MG PO TABS: 500.0000 mg | ORAL_TABLET | Freq: Three times a day (TID) | ORAL | Status: DC | PRN

## 2023-10-20 MED ORDER — DICLOFENAC SODIUM 1 % EX GEL: 2.0000 g | Freq: Three times a day (TID) | CUTANEOUS | Status: DC | PRN

## 2023-10-20 MED ORDER — VANCOMYCIN HCL 1000 MG IV SOLR
INTRAVENOUS | Status: AC
  Filled 2023-10-20: qty 20

## 2023-10-20 MED ORDER — SODIUM CHLORIDE 0.9% FLUSH
3.0000 mL | Freq: Two times a day (BID) | INTRAVENOUS | Status: DC
  Administered 2023-10-20 – 2023-10-22 (×4): 3 mL via INTRAVENOUS
  Administered 2023-10-22: 20 mL via INTRAVENOUS
  Administered 2023-10-23: 3 mL via INTRAVENOUS

## 2023-10-20 MED ORDER — UMECLIDINIUM BROMIDE 62.5 MCG/ACT IN AEPB
1.0000 | INHALATION_SPRAY | Freq: Every day | RESPIRATORY_TRACT | Status: DC
  Administered 2023-10-21 – 2023-10-23 (×3): 1 via RESPIRATORY_TRACT
  Filled 2023-10-20 (×2): qty 7

## 2023-10-20 MED ORDER — ALTEPLASE 1 MG/ML SYRINGE FOR VASCULAR PROCEDURE
INTRAMUSCULAR | Status: DC | PRN
  Administered 2023-10-20: 10 mg via INTRA_ARTERIAL

## 2023-10-20 MED ORDER — HEPARIN (PORCINE) 25000 UT/250ML-% IV SOLN
900.0000 [IU]/h | INTRAVENOUS | Status: DC
  Administered 2023-10-20 (×2): 900 [IU]/h via INTRAVENOUS
  Filled 2023-10-20: qty 250

## 2023-10-20 MED ORDER — SENNOSIDES-DOCUSATE SODIUM 8.6-50 MG PO TABS: 1.0000 | ORAL_TABLET | Freq: Every evening | ORAL | Status: DC | PRN

## 2023-10-20 MED ORDER — DICLOFENAC SODIUM 1 % EX GEL
2.0000 g | Freq: Three times a day (TID) | CUTANEOUS | Status: DC | PRN
Start: 2023-10-20 — End: 2023-10-23

## 2023-10-20 MED ORDER — DIPHENHYDRAMINE HCL 25 MG PO CAPS
50.0000 mg | ORAL_CAPSULE | Freq: Once | ORAL | Status: AC
  Administered 2023-10-20: 50 mg via ORAL
  Filled 2023-10-20: qty 2

## 2023-10-20 MED ORDER — MAGNESIUM HYDROXIDE 400 MG/5ML PO SUSP: 30.0000 mL | Freq: Every day | ORAL | Status: DC | PRN

## 2023-10-20 MED ORDER — HEPARIN SODIUM (PORCINE) 5000 UNIT/ML IJ SOLN
INTRAMUSCULAR | Status: AC
  Filled 2023-10-20: qty 1

## 2023-10-20 MED ORDER — GENTAMICIN SULFATE 40 MG/ML IJ SOLN
INTRAMUSCULAR | Status: AC
  Filled 2023-10-20: qty 4

## 2023-10-20 MED ORDER — FAMOTIDINE 20 MG PO TABS
40.0000 mg | ORAL_TABLET | Freq: Once | ORAL | Status: AC | PRN
  Administered 2023-10-20: 40 mg via ORAL

## 2023-10-20 MED ORDER — LIDOCAINE HCL (PF) 1 % IJ SOLN
INTRAMUSCULAR | Status: DC | PRN
  Administered 2023-10-20: 10 mL

## 2023-10-20 MED ORDER — HEPARIN (PORCINE) IN NACL 1000-0.9 UT/500ML-% IV SOLN
INTRAVENOUS | Status: DC | PRN
  Administered 2023-10-20: 1000 mL

## 2023-10-20 MED ORDER — FENTANYL CITRATE (PF) 100 MCG/2ML IJ SOLN
INTRAMUSCULAR | Status: DC | PRN
  Administered 2023-10-20: 12.5 ug via INTRAVENOUS
  Administered 2023-10-20: 50 ug via INTRAVENOUS
  Administered 2023-10-20: 12.5 ug via INTRAVENOUS
  Administered 2023-10-20: 25 ug via INTRAVENOUS

## 2023-10-20 MED ORDER — SPIRONOLACTONE 12.5 MG HALF TABLET
12.5000 mg | ORAL_TABLET | Freq: Every day | ORAL | Status: DC
  Administered 2023-10-20 – 2023-10-23 (×4): 12.5 mg via ORAL
  Filled 2023-10-20 (×5): qty 1

## 2023-10-20 MED ORDER — HEPARIN (PORCINE) 25000 UT/250ML-% IV SOLN
1000.0000 [IU]/h | INTRAVENOUS | Status: DC
  Administered 2023-10-21: 900 [IU]/h via INTRAVENOUS
  Administered 2023-10-22: 1000 [IU]/h via INTRAVENOUS
  Filled 2023-10-20 (×2): qty 250

## 2023-10-20 MED ORDER — METHYLPREDNISOLONE SODIUM SUCC 125 MG IJ SOLR: 125.0000 mg | Freq: Once | INTRAMUSCULAR | Status: DC | PRN

## 2023-10-20 MED ORDER — SODIUM CHLORIDE 0.9% FLUSH: 3.0000 mL | INTRAVENOUS | Status: DC | PRN

## 2023-10-20 MED ORDER — TIROFIBAN (AGGRASTAT) BOLUS VIA INFUSION
25.0000 ug/kg | Freq: Once | INTRAVENOUS | Status: AC
  Administered 2023-10-20: 1360 ug via INTRAVENOUS
  Filled 2023-10-20: qty 28

## 2023-10-20 MED ORDER — FUROSEMIDE 20 MG PO TABS: 20.0000 mg | ORAL_TABLET | Freq: Every day | ORAL | Status: DC | PRN

## 2023-10-20 MED ORDER — METHYLPREDNISOLONE SODIUM SUCC 125 MG IJ SOLR
INTRAMUSCULAR | Status: AC
  Administered 2023-10-20: 80 mg
  Filled 2023-10-20: qty 2

## 2023-10-20 MED ORDER — SACUBITRIL-VALSARTAN 24-26 MG PO TABS
1.0000 | ORAL_TABLET | Freq: Two times a day (BID) | ORAL | Status: DC
Start: 2023-10-20 — End: 2023-10-23
  Administered 2023-10-20 – 2023-10-23 (×7): 1 via ORAL
  Filled 2023-10-20 (×7): qty 1

## 2023-10-20 MED ORDER — ONDANSETRON HCL 4 MG/2ML IJ SOLN
4.0000 mg | Freq: Four times a day (QID) | INTRAMUSCULAR | Status: DC | PRN
  Administered 2023-10-20 – 2023-10-23 (×2): 4 mg via INTRAVENOUS
  Filled 2023-10-20 (×2): qty 2

## 2023-10-20 MED ORDER — LIDOCAINE 5 % EX PTCH
1.0000 | MEDICATED_PATCH | CUTANEOUS | Status: DC
  Administered 2023-10-21 – 2023-10-23 (×3): 1 via TRANSDERMAL
  Filled 2023-10-20 (×4): qty 1

## 2023-10-20 MED ORDER — CEFAZOLIN SODIUM-DEXTROSE 2-4 GM/100ML-% IV SOLN
2.0000 g | INTRAVENOUS | Status: AC
  Administered 2023-10-20: 2 g via INTRAVENOUS

## 2023-10-20 MED ORDER — MIRTAZAPINE 15 MG PO TABS
7.5000 mg | ORAL_TABLET | Freq: Every day | ORAL | Status: DC
  Administered 2023-10-20 – 2023-10-22 (×4): 7.5 mg via ORAL
  Filled 2023-10-20 (×4): qty 1

## 2023-10-20 MED ORDER — FAMOTIDINE 20 MG PO TABS
ORAL_TABLET | ORAL | Status: AC
  Filled 2023-10-20: qty 2

## 2023-10-20 MED ORDER — HEPARIN SODIUM (PORCINE) 1000 UNIT/ML IJ SOLN
INTRAMUSCULAR | Status: AC
  Filled 2023-10-20: qty 10

## 2023-10-20 MED ORDER — PANTOPRAZOLE SODIUM 20 MG PO TBEC
20.0000 mg | DELAYED_RELEASE_TABLET | Freq: Every day | ORAL | Status: DC
  Administered 2023-10-20 – 2023-10-23 (×4): 20 mg via ORAL
  Filled 2023-10-20 (×4): qty 1

## 2023-10-20 MED ORDER — VITAMIN D 25 MCG (1000 UNIT) PO TABS
2000.0000 [IU] | ORAL_TABLET | Freq: Every day | ORAL | Status: DC
Start: 2023-10-20 — End: 2023-10-23
  Administered 2023-10-20 – 2023-10-23 (×4): 2000 [IU] via ORAL
  Filled 2023-10-20 (×4): qty 2

## 2023-10-20 MED ORDER — DIPHENHYDRAMINE HCL 50 MG/ML IJ SOLN: 50.0000 mg | Freq: Once | INTRAMUSCULAR | Status: DC | PRN

## 2023-10-20 MED ORDER — ACETAMINOPHEN 650 MG RE SUPP: 650.0000 mg | Freq: Four times a day (QID) | RECTAL | Status: DC | PRN

## 2023-10-20 MED ORDER — PREDNISONE 50 MG PO TABS
50.0000 mg | ORAL_TABLET | Freq: Four times a day (QID) | ORAL | Status: AC
  Administered 2023-10-20 (×2): 50 mg via ORAL
  Filled 2023-10-20 (×2): qty 1

## 2023-10-20 MED ORDER — TIROFIBAN HCL IN NACL 5-0.9 MG/100ML-% IV SOLN
INTRAVENOUS | Status: AC
  Filled 2023-10-20: qty 100

## 2023-10-20 MED ORDER — SODIUM CHLORIDE 0.9 % IV SOLN: INTRAVENOUS | Status: DC

## 2023-10-20 MED ORDER — TIROFIBAN HCL IN NACL 5-0.9 MG/100ML-% IV SOLN
0.0750 ug/kg/min | INTRAVENOUS | Status: AC
  Administered 2023-10-20 – 2023-10-21 (×2): 0.075 ug/kg/min via INTRAVENOUS
  Filled 2023-10-20: qty 100

## 2023-10-20 MED ORDER — ALBUTEROL SULFATE (2.5 MG/3ML) 0.083% IN NEBU
2.5000 mg | INHALATION_SOLUTION | Freq: Four times a day (QID) | RESPIRATORY_TRACT | Status: DC | PRN
  Administered 2023-10-21: 2.5 mg via RESPIRATORY_TRACT
  Filled 2023-10-20: qty 3

## 2023-10-20 MED ORDER — CEFAZOLIN SODIUM-DEXTROSE 2-4 GM/100ML-% IV SOLN
INTRAVENOUS | Status: AC
  Filled 2023-10-20: qty 100

## 2023-10-20 MED ORDER — SODIUM CHLORIDE 0.9 % IV SOLN: 250.0000 mL | INTRAVENOUS | Status: AC | PRN

## 2023-10-20 MED ORDER — APIXABAN 5 MG PO TABS: 5.0000 mg | ORAL_TABLET | Freq: Two times a day (BID) | ORAL | Status: DC

## 2023-10-20 MED ORDER — MELATONIN 5 MG PO TABS
2.5000 mg | ORAL_TABLET | Freq: Every day | ORAL | Status: DC
  Administered 2023-10-20 – 2023-10-22 (×4): 2.5 mg via ORAL
  Filled 2023-10-20 (×4): qty 1

## 2023-10-20 MED ORDER — OXYCODONE-ACETAMINOPHEN 5-325 MG PO TABS
2.0000 | ORAL_TABLET | Freq: Four times a day (QID) | ORAL | Status: DC | PRN
  Administered 2023-10-20 – 2023-10-21 (×4): 2 via ORAL
  Filled 2023-10-20 (×5): qty 2

## 2023-10-20 MED ORDER — EMPAGLIFLOZIN 10 MG PO TABS
10.0000 mg | ORAL_TABLET | Freq: Every day | ORAL | Status: DC
  Administered 2023-10-20 – 2023-10-23 (×4): 10 mg via ORAL
  Filled 2023-10-20 (×4): qty 1

## 2023-10-20 MED ORDER — ACETAMINOPHEN 325 MG PO TABS: 650.0000 mg | ORAL_TABLET | Freq: Four times a day (QID) | ORAL | Status: DC | PRN

## 2023-10-20 MED ORDER — ONDANSETRON HCL 4 MG PO TABS: 4.0000 mg | ORAL_TABLET | Freq: Four times a day (QID) | ORAL | Status: DC | PRN

## 2023-10-20 MED ORDER — ALTEPLASE 2 MG IJ SOLR
INTRAMUSCULAR | Status: AC
  Filled 2023-10-20: qty 10

## 2023-10-20 MED ORDER — FENTANYL CITRATE (PF) 100 MCG/2ML IJ SOLN
INTRAMUSCULAR | Status: AC
  Filled 2023-10-20: qty 2

## 2023-10-20 MED ORDER — HEPARIN BOLUS VIA INFUSION
3300.0000 [IU] | Freq: Once | INTRAVENOUS | Status: AC
Start: 2023-10-20 — End: 2023-10-20
  Administered 2023-10-20: 3300 [IU] via INTRAVENOUS
  Filled 2023-10-20: qty 3300

## 2023-10-20 MED ORDER — POLYVINYL ALCOHOL 1.4 % OP SOLN: 1.0000 [drp] | Freq: Four times a day (QID) | OPHTHALMIC | Status: DC | PRN

## 2023-10-20 SURGICAL SUPPLY — 20 items
BALLOON LUTONIX 018 5X40X130 (BALLOONS) IMPLANT
BALLOON LUTONIX DCB 7X60X130 (BALLOONS) IMPLANT
CANISTER PENUMBRA ENGINE (MISCELLANEOUS) IMPLANT
CATH ANGIO 5F PIGTAIL 65CM (CATHETERS) IMPLANT
CATH INFUS 90X20 (CATHETERS) IMPLANT
CATH LIGHTNING BOLT 7 130 (CATHETERS) IMPLANT
CATH SLIP 5FR 0.38 X 40 KMP (CATHETERS) IMPLANT
COVER PROBE ULTRASOUND 5X96 (MISCELLANEOUS) IMPLANT
DEVICE PRESTO INFLATION (MISCELLANEOUS) IMPLANT
DEVICE VASC CLSR CELT ART 7 (Vascular Products) IMPLANT
GLIDESHEATH SLENDER 7FR .021G (SHEATH) IMPLANT
GLIDEWIRE ADV .035X180CM (WIRE) IMPLANT
GOWN STRL REUS W/ TWL LRG LVL3 (GOWN DISPOSABLE) ×1 IMPLANT
NDL ENTRY 21GA 7CM ECHOTIP (NEEDLE) IMPLANT
NEEDLE ENTRY 21GA 7CM ECHOTIP (NEEDLE) ×1 IMPLANT
PACK ANGIOGRAPHY (CUSTOM PROCEDURE TRAY) ×1 IMPLANT
SET INTRO CAPELLA COAXIAL (SET/KITS/TRAYS/PACK) IMPLANT
SHEATH BRITE TIP 5FRX11 (SHEATH) IMPLANT
SHEATH BRITE TIP 7FRX11 (SHEATH) IMPLANT
WIRE G V18X300CM (WIRE) IMPLANT

## 2023-10-20 NOTE — H&P (Signed)
 [place]   PATIENT NAME: Christopher Bryan    MR#:  [id number]  DATE OF BIRTH:  1960/05/11  DATE OF ADMISSION:  10/19/2023  PRIMARY CARE PHYSICIAN: [facility]   Patient is coming from: Home  REQUESTING/REFERRING PHYSICIAN: Ward, Josette HERO, DO  CHIEF COMPLAINT:   Chief Complaint  Patient presents with   Leg Pain    HISTORY OF PRESENT ILLNESS:  Christopher Whittenberg. is a 63 y.o. male with medical history significant for CHF, paroxysmal atrial fibrillation, DVT, essential hypertension, peripheral arterial disease, and right-sided heart failure, who presented to the emergency room with acute onset of worsening left leg pain since Tuesday which has been feeling cold today with some tenderness without erythema.  No fever or chills.  No nausea or vomiting or abdominal pain.  He continues to smoke 4 cigarettes/day.  No chest pain or palpitations.  No cough or wheezing or dyspnea.  ED Course: When he came to the ER, BP was 169/76 with otherwise normal vital signs.  Labs revealed sodium 146 and glucose 122 with otherwise unremarkable BMP.  CBC showed mild leukopenia of 3.2 and hemoglobin 12.8 and hematocrit 40.8 close to previous levels.  Coag profile was normal. EKG as reviewed by me : EKG showed normal sinus rhythm with a rate of 75 with LVH and secondary repolarization and Q waves anteroseptally. Imaging: None.  The patient was given 4 mg of IV Zofran  and 4 mg of IV morphine  sulfate and was placed on IV heparin  after discussion with Dr. Marea who is aware about the patient.  The patient will be admitted to a medical telemetry bed for further evaluation and management. PAST MEDICAL HISTORY:   Past Medical History:  Diagnosis Date   A-fib [facility])    CHF (congestive heart failure) (HCC)    COPD (chronic obstructive pulmonary disease) (HCC)    DVT (deep venous thrombosis) (HCC)    Hypertension    Peripheral arterial disease (HCC)    Right-sided heart failure (HCC)     Smoker     PAST SURGICAL HISTORY:   Past Surgical History:  Procedure Laterality Date   KNEE ARTHROSCOPY Left    LOWER EXTREMITY ANGIOGRAPHY Left 01/21/2020   Procedure: Lower Extremity Angiography;  Surgeon: Jama Cordella MATSU, MD;  Location: [facility] INVASIVE CV LAB;  Service: Cardiovascular;  Laterality: Left;   LOWER EXTREMITY ANGIOGRAPHY Left 09/08/2021   Procedure: Lower Extremity Angiography;  Surgeon: Marea Selinda RAMAN, MD;  Location: [facility] INVASIVE CV LAB;  Service: Cardiovascular;  Laterality: Left;   LOWER EXTREMITY ANGIOGRAPHY Left 06/20/2022   Procedure: Lower Extremity Angiography;  Surgeon: Marea Selinda RAMAN, MD;  Location: [facility] INVASIVE CV LAB;  Service: Cardiovascular;  Laterality: Left;   ORIF FEMUR FRACTURE Right    RIGHT/LEFT HEART CATH AND CORONARY ANGIOGRAPHY N/A 01/30/2023   Procedure: RIGHT/LEFT HEART CATH AND CORONARY ANGIOGRAPHY;  Surgeon: Mady Bruckner, MD;  Location: [facility] INVASIVE CV LAB;  Service: Cardiovascular;  Laterality: N/A;    SOCIAL HISTORY:   Social History   Tobacco Use   Smoking status: Every Day    Current packs/day: 0.50    Average packs/day: 0.5 packs/day for 40.0 years (20.0 ttl pk-yrs)    Types: Cigarettes   Smokeless tobacco: Never  Substance Use Topics   Alcohol  use: Not Currently    Comment: 0    FAMILY HISTORY:   Family History  Problem Relation Age of Onset   CAD Mother    Emphysema Father  DRUG ALLERGIES:   Allergies  Allergen Reactions   Iodine  Rash   Ivp Dye [Iodinated Contrast Media] Hives   Mushroom Other (See Comments)   Latex Hives and Itching   Iodinated Contrast Media Hives and Rash   Other Itching and Rash    [place] Meat allergy per patient    REVIEW OF SYSTEMS:   ROS As per history of present illness. All pertinent systems were reviewed above. Constitutional, HEENT, cardiovascular, respiratory, GI, GU, musculoskeletal, neuro, psychiatric, endocrine, integumentary and hematologic systems were reviewed and are  otherwise negative/unremarkable except for positive findings mentioned above in the HPI.   MEDICATIONS AT HOME:   Prior to Admission medications   Medication Sig Start Date End Date Taking? Authorizing Provider  acetaminophen  (TYLENOL ) 325 MG tablet Take 650 mg by mouth every 6 (six) hours as needed. 08/07/23  Yes [provider]  albuterol  (PROVENTIL  HFA;VENTOLIN  HFA) 108 (90 Base) MCG/ACT inhaler Inhale 2 puffs into the lungs every 6 (six) hours as needed for wheezing or shortness of breath.   Yes [provider]  apixaban  (ELIQUIS ) 5 MG TABS tablet Take 1 tablet (5 mg total) by mouth 2 (two) times daily. 06/24/22  Yes Brown, Fallon E, NP  atorvastatin  (LIPITOR ) 40 MG tablet Take 20 mg by mouth at bedtime.   Yes [provider]  carboxymethylcellulose (REFRESH PLUS) 0.5 % SOLN Place 1 drop into both eyes 4 (four) times daily as needed. 09/09/21  Yes Wieting, Richard, MD  carvedilol  (COREG ) 12.5 MG tablet Take 1 tablet (12.5 mg total) by mouth 2 (two) times daily. 06/09/23 10/20/23 Yes Furth, Cadence H, PA-C  cholecalciferol  (VITAMIN D ) 1000 units tablet Take 2,000 Units by mouth daily.   Yes [provider]  clopidogrel  (PLAVIX ) 75 MG tablet Take 1 tablet (75 mg total) by mouth daily. 09/10/21  Yes Wieting, Richard, MD  diclofenac  Sodium (VOLTAREN ) 1 % GEL Apply 2 g topically 3 (three) times daily as needed.   Yes [provider]  empagliflozin  (JARDIANCE ) 10 MG TABS tablet Take 1 tablet (10 mg total) by mouth daily before breakfast. 03/30/23  Yes Wieting, Richard, MD  fluticasone (FLONASE) 50 MCG/ACT nasal spray Place 1 spray into both nostrils daily as needed. 08/07/23  Yes [provider]  fluticasone-salmeterol (ADVAIR) 250-50 MCG/ACT AEPB Inhale 1 puff into the lungs in the morning and at bedtime.   Yes [provider]  furosemide  (LASIX ) 20 MG tablet Take 1 tablet (20 mg total) by mouth daily as needed for edema (weight gain of 3 lbs in a  day or 5 lbs in a week). 03/30/23  Yes Wieting, Richard, MD  gabapentin  (NEURONTIN ) 300 MG capsule Take 600 mg by mouth 3 (three) times daily. TAKE TWO CAPSULES BY MOUTH THREE TIMES A DAY *START WITH 1 CAPSULE THREE TIMES A DAY AND INCREASE BY 1 CAPSULE EVERY 3 DAYS UNTIL TAKING TWO CAPSULES THREE TIMES A DAY 03/23/23  Yes [provider]  lidocaine  (LIDODERM ) 5 % Place 1 patch onto the skin daily. Remove & Discard patch within 12 hours or as directed by MD   Yes [provider]  loratadine  (CLARITIN ) 10 MG tablet Take 10 mg by mouth daily as needed. 08/07/23  Yes [provider]  melatonin 3 MG TABS tablet Take 3 mg by mouth at bedtime.   Yes [provider]  methocarbamol  (ROBAXIN ) 500 MG tablet Take 500 mg by mouth every 8 (eight) hours as needed for muscle spasms.   Yes [provider]  mirtazapine  (REMERON ) 7.5 MG tablet Take 7.5 mg by mouth at bedtime.   Yes [provider]  pantoprazole  (PROTONIX ) 20 MG tablet Take 20 mg by mouth daily.   Yes [provider]  polyethylene glycol powder (GLYCOLAX /MIRALAX ) 17 GM/SCOOP powder Take 17 g by mouth daily as needed for moderate constipation. 03/30/23  Yes Wieting, Richard, MD  sacubitril -valsartan  (ENTRESTO ) 24-26 MG Take 1 tablet by mouth 2 (two) times daily. 05/01/23  Yes Furth, Cadence H, PA-C  sennosides-docusate sodium  (SENOKOT-S) 8.6-50 MG tablet Take 1-2 tablets by mouth at bedtime as needed for constipation.   Yes [provider]  sildenafil  (VIAGRA ) 100 MG tablet Take 100 mg by mouth daily as needed for erectile dysfunction.   Yes [provider]  spironolactone  (ALDACTONE ) 25 MG tablet Take 0.5 tablets (12.5 mg total) by mouth daily. 03/31/23  Yes Wieting, Richard, MD  tamsulosin  (FLOMAX ) 0.4 MG CAPS capsule Take 0.4 mg by mouth daily after supper. 08/07/23  Yes [provider]  tiotropium (SPIRIVA ) 18 MCG inhalation capsule Place 18 mcg into inhaler and inhale  daily.   Yes [provider]  azithromycin  (ZITHROMAX ) 250 MG tablet Take 1 tablet (250 mg total) by mouth daily for 2 days. Patient not taking: Reported on 10/20/2023 03/31/23   Josette Ade, MD  nicotine  polacrilex (COMMIT) 4 MG lozenge Take 4 mg by mouth as needed. DISSOLVE 1 LOZENGE BY MOUTH AS DIRECTED FOR SMOKING CESSATION USE AT LEAST 8-10 LOZENGES EACH DAY TO START. DO NOT USE MORE THAN 20 LOZENGES IN A DAY. DO NOT EAT OR DRINK FOR 15 MINUTES BEFORE AND DURING USE Patient not taking: Reported on 10/20/2023 12/26/22   [provider]      VITAL SIGNS:  Blood pressure (!) 169/76, pulse 87, temperature 97.8 F (36.6 C), temperature source Oral, resp. rate 20, height 5' 8 (1.727 m), weight 54.4 kg, SpO2 97%.  PHYSICAL EXAMINATION:  Physical Exam  GENERAL:  63 y.o.-year-old male patient lying in the bed with no acute distress.  EYES: Pupils equal, round, reactive to light and accommodation. No scleral icterus. Extraocular muscles intact.  HEENT: Head atraumatic, normocephalic. Oropharynx and nasopharynx clear.  NECK:  Supple, no jugular venous distention. No thyroid enlargement, no tenderness.  LUNGS: Normal breath sounds bilaterally, no wheezing, rales,rhonchi or crepitation. No use of accessory muscles of respiration.  CARDIOVASCULAR: Regular rate and rhythm, S1, S2 normal. No murmurs, rubs, or gallops.  Cold left leg and foot with significantly diminished PT and DP pulses palpable with Doppler.  ABDOMEN: Soft, nondistended, nontender. Bowel sounds present. No organomegaly or mass.  EXTREMITIES: No pedal edema, cyanosis, or clubbing.  NEUROLOGIC: Cranial nerves II through XII are intact. Muscle strength 5/5 in all extremities. Sensation intact. Gait not checked.  PSYCHIATRIC: The patient is alert and oriented x 3.  Normal affect and good eye contact. SKIN: No obvious rash, lesion, or ulcer.   LABORATORY PANEL:   CBC Recent Labs  Lab 10/20/23 0427  WBC 3.4*   HGB 12.0*  HCT 37.8*  PLT 178   ------------------------------------------------------------------------------------------------------------------  Chemistries  Recent Labs  Lab 10/20/23 0427  NA 144  K 3.8  CL 112*  CO2 27  GLUCOSE 129*  BUN 19  CREATININE 0.98  CALCIUM  8.5*   ------------------------------------------------------------------------------------------------------------------  Cardiac Enzymes No results for input(s): TROPONINI in the last 168 hours. ------------------------------------------------------------------------------------------------------------------  RADIOLOGY:  No results found.    IMPRESSION AND PLAN:  Assessment and Plan: * Critical limb ischemia of left lower  extremity [facility]) - The patient patient will be admitted to the medical telemetry bed. - The patient will be continued on IV heparin . - Will place him on p.o. Pletal. - Will continue Plavix . - Pain management will be provided. - Vascular surgery consult will be obtained. - Dr. Marea was notified about the patient.  Chronic diastolic CHF (congestive heart failure) (HCC) - Will continue Coreg  and Jardiance  as well as Entresto  and Aldactone  as well as Lasix .  Dyslipidemia - Will continue statin therapy.  Paroxysmal atrial fibrillation (HCC) - Will continue Eliquis  and Coreg .  BPH (benign prostatic hyperplasia) - Will continue Flomax .  GERD without esophagitis - Will continue PPI therapy.   DVT prophylaxis: IV heparin . Advanced Care Planning:  Code Status: full code. Family Communication:  The plan of care was discussed in details with the patient (and family). I answered all questions. The patient agreed to proceed with the above mentioned plan. Further management will depend upon hospital course. Disposition Plan: Back to previous home environment Consults called: Vascular surgery. All the records are reviewed and case discussed with ED provider.  Status is:  Inpatient  At the time of the admission, it appears that the appropriate admission status for this patient is inpatient.  This is judged to be reasonable and necessary in order to provide the required intensity of service to ensure the patient's safety given the presenting symptoms, physical exam findings and initial radiographic and laboratory data in the context of comorbid conditions.  The patient requires inpatient status due to high intensity of service, high risk of further deterioration and high frequency of surveillance required.  I certify that at the time of admission, it is my clinical judgment that the patient will require inpatient hospital care extending more than 2 midnights.                            Dispo: The patient is from: Home              Anticipated d/c is to: Home              Patient currently is not medically stable to d/c.              Difficult to place patient: No  [place]ison DELENA Peaches M.D on 10/20/2023 at 5:48 AM  [facility]   From 7 PM-7 AM, contact night-coverage [email or web address]  CC: Primary care physician; [facility]

## 2023-10-20 NOTE — Progress Notes (Signed)
 ANTICOAGULATION CONSULT NOTE  Pharmacy Consult for heparin  infusion Indication: Limb ischemia (Hx of DVT on Eliquis )  Allergies  Allergen Reactions   Iodine  Rash   Ivp Dye [Iodinated Contrast Media] Hives   Latex Hives and Itching   Iodinated Contrast Media Hives and Rash   Other Itching and Rash    [place] Meat allergy per patient    Patient Measurements: Height: 5' 8 (172.7 cm) Weight: 54.4 kg (120 lb) IBW/kg (Calculated) : 68.4 HEPARIN  DW (KG): 54.4  Vital Signs: Temp: 98.1 F (36.7 C) (09/18 2104) Temp Source: Oral (09/18 2104) BP: 169/76 (09/18 2104) Pulse Rate: 87 (09/18 2104)  Labs: Recent Labs    10/20/23 0002  HGB 12.8*  HCT 40.8  PLT 185  APTT 28  LABPROT 13.3  INR 1.0  CREATININE 1.13    Estimated Creatinine Clearance: 50.1 mL/min (by C-G formula based on SCr of 1.13 mg/dL).   Medical History: Past Medical History:  Diagnosis Date   A-fib (HCC)    CHF (congestive heart failure) (HCC)    COPD (chronic obstructive pulmonary disease) (HCC)    DVT (deep venous thrombosis) (HCC)    Hypertension    Peripheral arterial disease (HCC)    Right-sided heart failure (HCC)    Smoker     Medications:  PTA Meds: Eliquis  5 mg BID, last dose ~ 4 days ago (forgot med while traveling). Baseline HL 0.52 (still elevated), aPTT 28  Assessment: Pt is a 63 yo male with hx of blood clots on Eliquis , presenting to ED c/o left lower leg pain.  Goal of Therapy:  Heparin  level 0.3-0.7 units/ml aPTT 66-102 seconds Monitor platelets by anticoagulation protocol: Yes   Plan:  Bolus 3300 units x 1 Start heparin  infusion at 900 units/hr Will follow aPTT until correlation w/ HL confirmed Will check aPTT in 6 hr after start of infusion HL & CBC daily while on heparin   [place] CANDIE Dills, PharmD, [facility] 10/20/2023 1:02 AM

## 2023-10-20 NOTE — Assessment & Plan Note (Signed)
Will continue Flomax.

## 2023-10-20 NOTE — Assessment & Plan Note (Signed)
 Will continue statin therapy

## 2023-10-20 NOTE — Assessment & Plan Note (Addendum)
-   Will continue Coreg  and Jardiance  as well as Entresto  and Aldactone  as well as Lasix .

## 2023-10-20 NOTE — Consult Note (Signed)
 Hospital Consult    Reason for Consult:  Left Lower extremity Ischemia Requesting Physician:  Dr Josette Sink DO MRN #:  [id number]  History of Present Illness: This is a 63 y.o. male male with history of atrial fibrillation, CHF, COPD, prior PE and DVT on Eliquis , peripheral arterial disease on Plavix  who presents to the emergency department with increased left leg pain, numbness and feeling like his leg was cool to touch that started 4 days ago.  States he was out of town and did not take his Plavix  or Eliquis  for several days.  He states on Tuesday his symptoms started and have progressively worsened. Patient is currently on a heparin  infusion.   Past Medical History:  Diagnosis Date   A-fib [facility])    CHF (congestive heart failure) (HCC)    COPD (chronic obstructive pulmonary disease) (HCC)    DVT (deep venous thrombosis) (HCC)    Hypertension    Peripheral arterial disease (HCC)    Right-sided heart failure (HCC)    Smoker     Past Surgical History:  Procedure Laterality Date   KNEE ARTHROSCOPY Left    LOWER EXTREMITY ANGIOGRAPHY Left 01/21/2020   Procedure: Lower Extremity Angiography;  Surgeon: Jama Cordella MATSU, MD;  Location: [facility] INVASIVE CV LAB;  Service: Cardiovascular;  Laterality: Left;   LOWER EXTREMITY ANGIOGRAPHY Left 09/08/2021   Procedure: Lower Extremity Angiography;  Surgeon: Marea Selinda RAMAN, MD;  Location: [facility] INVASIVE CV [facility];  Service: Cardiovascular;  Laterality: Left;   LOWER EXTREMITY ANGIOGRAPHY Left 06/20/2022   Procedure: Lower Extremity Angiography;  Surgeon: Marea Selinda RAMAN, MD;  Location: [facility] INV[facility]IVE CV [facility];  Service: Cardiovascular;  Laterality: Left;   ORIF FEMUR FRACTURE Right    RIGHT/LEFT HEART CATH AND CORONARY ANGIOGRAPHY N/A 01/30/2023   Procedure: RIGHT/LEFT HEART CATH AND CORONARY ANGIOGRAPHY;  Surgeon: Mady Bruckner, MD;  Location: [facility] INVASIVE CV LAB;  Service: Cardiovascular;  Laterality: N/A;    Allergies  Allergen Reactions   Iodine   Rash   Ivp Dye [Iodinated Contrast Media] Hives   Mushroom Other (See Comments)   Latex Hives and Itching   Iodinated Contrast Media Hives and Rash   Other Itching and Rash    [place] Meat allergy per patient    Prior to Admission medications   Medication Sig Start Date End Date Taking? Authorizing Provider  acetaminophen  (TYLENOL ) 325 MG tablet Take 650 mg by mouth every 6 (six) hours as needed. 08/07/23  Yes [provider]  albuterol  (PROVENTIL  HFA;VENTOLIN  HFA) 108 (90 Base) MCG/ACT inhaler Inhale 2 puffs into the lungs every 6 (six) hours as needed for wheezing or shortness of breath.   Yes [provider]  apixaban  (ELIQUIS ) 5 MG TABS tablet Take 1 tablet (5 mg total) by mouth 2 (two) times daily. 06/24/22  Yes Brown, Fallon E, NP  atorvastatin  (LIPITOR ) 40 MG tablet Take 20 mg by mouth at bedtime.   Yes [provider]  carboxymethylcellulose (REFRESH PLUS) 0.5 % SOLN Place 1 drop into both eyes 4 (four) times daily as needed. 09/09/21  Yes Wieting, Richard, MD  carvedilol  (COREG ) 12.5 MG tablet Take 1 tablet (12.5 mg total) by mouth 2 (two) times daily. 06/09/23 10/20/23 Yes Furth, Cadence H, PA-C  cholecalciferol  (VITAMIN D ) 1000 units tablet Take 2,000 Units by mouth daily.   Yes [provider]  clopidogrel  (PLAVIX ) 75 MG tablet Take 1 tablet (75 mg total) by mouth daily. 09/10/21  Yes Wieting, Richard, MD  diclofenac  Sodium (VOLTAREN ) 1 % GEL  Apply 2 g topically 3 (three) times daily as needed.   Yes [provider]  empagliflozin  (JARDIANCE ) 10 MG TABS tablet Take 1 tablet (10 mg total) by mouth daily before breakfast. 03/30/23  Yes Wieting, Richard, MD  fluticasone (FLONASE) 50 MCG/ACT nasal spray Place 1 spray into both nostrils daily as needed. 08/07/23  Yes [provider]  fluticasone-salmeterol (ADVAIR) 250-50 MCG/ACT AEPB Inhale 1 puff into the lungs in the morning and at bedtime.   Yes [provider]  furosemide  (LASIX )  20 MG tablet Take 1 tablet (20 mg total) by mouth daily as needed for edema (weight gain of 3 lbs in a day or 5 lbs in a week). 03/30/23  Yes Wieting, Richard, MD  gabapentin  (NEURONTIN ) 300 MG capsule Take 600 mg by mouth 3 (three) times daily. TAKE TWO CAPSULES BY MOUTH THREE TIMES A DAY *START WITH 1 CAPSULE THREE TIMES A DAY AND INCREASE BY 1 CAPSULE EVERY 3 DAYS UNTIL TAKING TWO CAPSULES THREE TIMES A DAY 03/23/23  Yes [provider]  lidocaine  (LIDODERM ) 5 % Place 1 patch onto the skin daily. Remove & Discard patch within 12 hours or as directed by MD   Yes [provider]  loratadine  (CLARITIN ) 10 MG tablet Take 10 mg by mouth daily as needed. 08/07/23  Yes [provider]  melatonin 3 MG TABS tablet Take 3 mg by mouth at bedtime.   Yes [provider]  methocarbamol  (ROBAXIN ) 500 MG tablet Take 500 mg by mouth every 8 (eight) hours as needed for muscle spasms.   Yes [provider]  mirtazapine  (REMERON ) 7.5 MG tablet Take 7.5 mg by mouth at bedtime.   Yes [provider]  pantoprazole  (PROTONIX ) 20 MG tablet Take 20 mg by mouth daily.   Yes [provider]  polyethylene glycol powder (GLYCOLAX /MIRALAX ) 17 GM/SCOOP powder Take 17 g by mouth daily as needed for moderate constipation. 03/30/23  Yes Wieting, Richard, MD  sacubitril -valsartan  (ENTRESTO ) 24-26 MG Take 1 tablet by mouth 2 (two) times daily. 05/01/23  Yes Furth, Cadence H, PA-C  sennosides-docusate sodium  (SENOKOT-S) 8.6-50 MG tablet Take 1-2 tablets by mouth at bedtime as needed for constipation.   Yes [provider]  sildenafil  (VIAGRA ) 100 MG tablet Take 100 mg by mouth daily as needed for erectile dysfunction.   Yes [provider]  spironolactone  (ALDACTONE ) 25 MG tablet Take 0.5 tablets (12.5 mg total) by mouth daily. 03/31/23  Yes Wieting, Richard, MD  tamsulosin  (FLOMAX ) 0.4 MG CAPS capsule Take 0.4 mg by mouth daily after supper. 08/07/23  Yes [provider]  tiotropium (SPIRIVA ) 18 MCG inhalation capsule Place 18 mcg into inhaler and inhale daily.   Yes [provider]  azithromycin  (ZITHROMAX ) 250 MG tablet Take 1 tablet (250 mg total) by mouth daily for 2 days. Patient not taking: Reported on 10/20/2023 03/31/23   Josette Ade, MD  nicotine  polacrilex (COMMIT) 4 MG lozenge Take 4 mg by mouth as needed. DISSOLVE 1 LOZENGE BY MOUTH AS DIRECTED FOR SMOKING CESSATION USE AT LEAST 8-10 LOZENGES EACH DAY TO START. DO NOT USE MORE THAN 20 LOZENGES IN A DAY. DO NOT EAT OR DRINK FOR 15 MINUTES BEFORE AND DURING USE Patient not taking: Reported on 10/20/2023 12/26/22   [provider]    Social History   Socioeconomic History   Marital status: Single    Spouse name: Not on file   Number of children: 2   Years of education: Not  on file   Highest education level: 10th grade  Occupational History   Occupation: Retired  Tobacco Use   Smoking status: Every Day    Current packs/day: 0.50    Average packs/day: 0.5 packs/day for 40.0 years (20.0 ttl pk-yrs)    Types: Cigarettes   Smokeless tobacco: Never  Vaping Use   Vaping status: Former  Substance and Sexual Activity   Alcohol  use: Not Currently    Comment: 0   Drug use: Yes    Frequency: 1.0 times per week    Types: Crack cocaine, Marijuana   Sexual activity: Not Currently  Other Topics Concern   Not on file  Social History Narrative   ** Merged History Encounter **       Social Drivers of Health   Financial Resource Strain: Low Risk  (03/29/2023)   Overall Financial Resource Strain (CARDIA)    Difficulty of Paying Living Expenses: Not hard at all  Food Insecurity: No Food Insecurity (03/28/2023)   Hunger Vital Sign    Worried About Running Out of Food in the Last Year: Never true    Ran Out of Food in the Last Year: Never true  Transportation Needs: Unmet Transportation Needs (04/03/2023)   PRAPARE - Administrator, Civil Service  (Medical): Yes    Lack of Transportation (Non-Medical): Yes  Physical Activity: Not on file  Stress: Not on file  Social Connections: Unknown (03/28/2023)   Social Connection and Isolation Panel    Frequency of Communication with Friends and Family: Twice a week    Frequency of Social Gatherings with Friends and Family: Once a week    Attends Religious Services: 1 to 4 times per year    Active Member of [employer] or Organizations: No    Attends Banker Meetings: Never    Marital Status: Patient declined  Catering manager Violence: Not At Risk (03/28/2023)   Humiliation, Afraid, Rape, and Kick questionnaire    Fear of Current or Ex-Partner: No    Emotionally Abused: No    Physically Abused: No    Sexually Abused: No     Family History  Problem Relation Age of Onset   CAD Mother    Emphysema Father     ROS: Otherwise negative unless mentioned in HPI  Physical Examination  Vitals:   10/20/23 0220 10/20/23 0400  BP:  102/62  Pulse:  61  Resp:  11  Temp: 97.8 F (36.6 C) 98 F (36.7 C)  SpO2:  94%   Body mass index is 18.25 kg/m.  General:  WDWN in NAD Gait: Not observed HENT: WNL, normocephalic Pulmonary: normal non-labored breathing, without Rales, rhonchi,  wheezing Cardiac: regular, without  Murmurs, rubs or gallops; without carotid bruits Abdomen: Positive bowel sounds, soft, NT/ND, no masses Skin: without rashes Vascular Exam/Pulses: Right lower extremity is warm to touch with strong doppler pulses. Left Lower extremity without Doppler DP/PT pulse and very weak left popliteal pulse.  Extremities: with ischemic changes, without Gangrene , without cellulitis; without open wounds;  Musculoskeletal: no muscle wasting or atrophy  Neurologic: A&O X 3;  No focal weakness or paresthesias are detected; speech is fluent/normal Psychiatric:  The pt has Normal affect. Lymph:  Unremarkable  CBC    Component Value Date/Time   WBC 3.4 (L) 10/20/2023 0427   RBC  4.13 (L) 10/20/2023 0427   HGB 12.0 (L) 10/20/2023 0427   HCT 37.8 (L) 10/20/2023 0427   PLT 178 10/20/2023 0427   MCV  91.5 10/20/2023 0427   MCH 29.1 10/20/2023 0427   MCHC 31.7 10/20/2023 0427   RDW 14.4 10/20/2023 0427   LYMPHSABS 2.0 10/20/2023 0002   MONOABS 0.4 10/20/2023 0002   EOSABS 0.1 10/20/2023 0002   BASOSABS 0.0 10/20/2023 0002    BMET    Component Value Date/Time   NA 144 10/20/2023 0427   NA 145 (H) 04/04/2023 1050   K 3.8 10/20/2023 0427   CL 112 (H) 10/20/2023 0427   CO2 27 10/20/2023 0427   GLUCOSE 129 (H) 10/20/2023 0427   BUN 19 10/20/2023 0427   BUN 11 04/04/2023 1050   CREATININE 0.98 10/20/2023 0427   CALCIUM  8.5 (L) 10/20/2023 0427   GFRNONAA >60 10/20/2023 0427   GFRAA >60 01/11/2018 0356    COAGS: Lab Results  Component Value Date   INR 1.0 10/20/2023   INR 1.1 03/28/2023   INR 1.5 (H) 01/26/2023     Non-Invasive Vascular Imaging:   None Ordered.   Statin:  Yes.   Beta Blocker:  Yes.   Aspirin :  No. ACEI:  No. ARB:  Yes.   CCB use:  No Other antiplatelets/anticoagulants:  Yes.   Eliquis  5 mg BID   ASSESSMENT/PLAN: This is a 63 y.o. male who presents to [facility] emergency room with painful left lower extremity. He endorses he went on a trip and forgot to take along his medications. These included Eliquis  5 mg BID and Plavix  75 mg daily. He restarted these medications when he got home but it did not make his symptoms resolve. He endorses Tuesday of this week the pain and numbness and tingling got severe and he decided to come to the emergency room for evaluation.   Upon exam in the emergency room his left lower extremity was cool to touch from his knee to his toes and I was unable to obtain doppler DP/PT pulses. Therefore vascular surgery recommends he undergo an angiogram to his left lower extremity with possible intervention.   After reviewing the patient history with Dr Cordella Shawl this morning the patient may need to go to the  operating room after the angiogram today for possible emergent vascular operative bypass of his left lower extremity based on the findings of the angiogram.   I had a long detailed discussion with the patient at the bedside in the emergency room regarding both procedures today. First we discussed the angiogram procedure, benefits, risks and complications. He verbalized his understanding and wishes to proceed. I answered all his questions this morning. Patient endorses he ahs been NPO since dinner time last night. He last took his eliquis  and plavix  on Thursday morning. He currently is on a heparin  infusion in the emergency room.   I then had a long discussion regarding the possible need for a left lower extremity bypass graft from his femoral artery to tibial artery. The exact procedure is unknown until the angiogram is completed but this discussion is being done now prior to the patient receiving any narcotic medication thus preventing us  from getting consent prior to the operation. Patient endorses he is okay with any form of vascular surgery in order to save his leg from amputation. I discussed in detail the procedure, benefits, risks and complications. He had many questions and I answered all of them based on the information I have at this moment regarding his vascular disease and known claudication. He agrees to proceed this afternoon if needed. Again he endorses he has been NPO since dinner last evening. He  continues to be on a Heparin  infusion. All his labs are normal this morning.   I discussed the case in detail with Dr Cordella Shawl MD and he agrees with the plan.         Gwendlyn JONELLE [facility]ank [facility] 10/20/2023 7:21 AM

## 2023-10-20 NOTE — Op Note (Signed)
 [facility]  Percutaneous Study/Intervention Procedural Note   Date of Surgery: 10/20/2023  Surgeon:  Cordella JUDITHANN Shawl, MD.  Pre-operative Diagnosis: Atherosclerotic occlusive disease bilateral lower extremities with rest pain of the left lower extremity; acute on chronic ischemia of left lower extremity  Post-operative diagnosis:  Same  Procedure(s) Performed:             1.  Introduction catheter into left lower extremity 3rd order catheter placement              2.    Contrast injection left lower extremity for distal runoff             3.  Percutaneous transluminal angioplasty of the femoral to femoral bypass and the origin of the profunda femoris artery to 5 mm             4.  Celt closure right common femoral arteriotomy  Anesthesia: Conscious sedation was administered under my direct supervision by the interventional radiology RN. IV Versed  plus fentanyl  were utilized. Continuous ECG, pulse oximetry and blood pressure was monitored throughout the entire procedure.  Conscious sedation was for a total of 2 hours 22 minutes and 44 seconds.  Sheath: 7 French slender right common femoral retrograde  Contrast: 60 cc  Fluoroscopy Time: 11 minutes  Indications:  Christopher Bryan. presents with acute ischemia of the left lower extremity.  He has a history of multiple interventions and ultimately a femoral-femoral bypass.  Based on physical examination the femoral-femoral bypass appears to be thrombosed.  The patient has known atherosclerotic occlusive disease. Noninvasive studies as well as physical examination demonstrate significant atherosclerotic changes. This places the patient at increased risk for limb loss. Angiography with the hope for intervention for limb salvage is recommended.  The risks and benefits are reviewed all questions answered patient agrees to proceed.  Procedure:  Christopher Parekh. is a 63 y.o. y.o. male who was identified and  appropriate procedural time out was performed.  The patient was then placed supine on the table and prepped and draped in the usual sterile fashion.    Ultrasound was placed in the sterile sleeve and the right groin was evaluated the right common femoral artery was echolucent and pulsatile indicating patency.  Image was recorded for the permanent record and under real-time visualization a microneedle was inserted into the common femoral artery microwire followed by a micro-sheath.  A J-wire was then advanced through the micro-sheath and a  5 [place] sheath was then inserted over a J-wire. J-wire was then advanced and a 5 French pigtail catheter was positioned at the level of T12. AP projection of the aorta was then obtained. Pigtail catheter was repositioned to above the bifurcation and a oblique view of the pelvis was obtained.  Subsequently the pigtail catheter was exchanged for a Kumpe catheter using an advantage wire with the Kumpe catheter I was able to select the femoral-femoral bypass and then advance the wire and subsequently the Kumpe into the profunda femoris on the left side.  Hand-injection of contrast demonstrated the distal profunda appeared to be widely patent.  There appears to be thrombus within the common femoral on the left as well as within the femoral-femoral bypass graft.  There appears to be thrombus within the proximal profunda femoris artery as well.  An infusion wire with a 20 cm infusion length is then advanced over the advantage wire and positioned across the femorofemoral bypass and into the profunda femoris  on the left.  A total of 10 mg of tPA is administered by hand-injection.  This is then allowed to dwell for 40 minutes.  Follow-up imaging demonstrates the femorofemoral is now patent but at both the right side as well as the left into the common femoral on the left there is greater than 70% stenosis and or residual thrombus material.  There is also residual thrombus noted in the  distal left common femoral extending into the proximal profunda femoris.  The sheath is then upsized to a 7 French slender over a V18 wire and the penumbra CAT 7 lightening catheter is prepped on the field.  5000 units of heparin  was then given and allowed to circulate.  Working over the [employer] wire the CAT 7 lightning catheter is then used to perform thrombectomy of the entire length of the femorofemoral bypass as well as into the common femoral and the left.  I attempted to negotiate the catheter into the mid profunda but the catheter would not advance.  Once multiple passes have been made follow-up imaging through the sheath in the right now showed the femoral-femoral bypass was patent there is 40 to 50% stenosis on the right at the bend in the femorofemoral bypass there is a 70 to 80% stenosis in the left femorofemoral just proximal to the anastomosis with the common femoral.  There is now clear visualization of a greater than 90% stenosis at the origin of the profunda femoris.  A 7 mm x 60 mm Lutonix balloon was then advanced over the wire in both the proximal and distal anastomotic areas of the femorofemoral bypass were treated.  Inflations were to 12 atm for approximately 1 minute.  Follow-up imaging demonstrated wide patency of the femorofemoral with resolution of the strictures and less than 10% residual stenosis.  I then created a magnified image of the profunda femoris origin lesion.  A 5 mm x 40 mm Lutonix drug-eluting balloon was advanced across this lesion inflated to 12 atm for 1 minute.  Follow-up imaging demonstrated wide patency with rapid filling of the profunda femoris.  Interestingly, the SFA is now also filling with good forward flow.  There is less than 10% residual stenosis.  There is no remaining thrombus identified.  After review of these images the sheath is exchanged for a 11 cm Pinnacle 7 French sheath which is positioned into the right distal common femoral is obtained and a Celt  device deployed under ultrasound guidance. There no immediate complications.   Findings:  The abdominal aorta is opacified with a bolus injection contrast. Renal arteries are single and patent bilaterally. The aorta itself has diffuse disease but no hemodynamically significant lesions. The common and external iliac arteries are widely patent on the right.  Multiple stents are noted on the right side.  Hand-injection of contrast demonstrated the distal profunda appeared to be widely patent.  There appears to be thrombus within the common femoral on the left as well as within the femoral-femoral bypass graft.  There appears to be thrombus within the proximal profunda femoris artery as well.   The right common femoral is widely patent as is the profunda femoris.  The SFA does appear patent as well.  The femoral-femoral bypass on the initial images is occluded.  Once this is crossed initial imaging of the left common femoral demonstrates thrombus within the distal common femoral there is thrombus within the proximal profunda femoris there is nonvisualization of the SFA.  Following tPA infusion there is now  forward flow but areas within the femoral-femoral bypass both on the right side and the left side are noted to have strictures these are treated with a 7 mm balloon inflation.  There is now a greater than 90% stenosis at the origin of the profunda femoris.  This is treated with a 5 mm balloon inflation with an excellent result and less than 10% residual stenosis and indeed now the SFA on the left is also visualized.      Summary: Successful recanalization left lower extremity for limb salvage                        Disposition: Patient was taken to the recovery room in stable condition having tolerated the procedure well.  Christopher Bryan, Cordella MATSU 10/20/2023,3:56 PM

## 2023-10-20 NOTE — Anesthesia Preprocedure Evaluation (Signed)
 Anesthesia Evaluation  Patient identified by MRN, date of birth, ID band Patient awake    Reviewed: Allergy & Precautions, NPO status , Patient's Chart, lab work & pertinent test results  Airway Mallampati: II  TM Distance: >3 FB Neck ROM: full    Dental  (+) Chipped   Pulmonary shortness of breath and with exertion, COPD, Current Smoker    + decreased breath sounds      Cardiovascular hypertension, (-) angina + Peripheral Vascular Disease and +CHF  + dysrhythmias Atrial Fibrillation      Neuro/Psych  Neuromuscular disease  negative psych ROS   GI/Hepatic Neg liver ROS,GERD  Controlled,,  Endo/Other  negative endocrine ROS    Renal/GU Renal disease     Musculoskeletal   Abdominal   Peds  Hematology negative hematology ROS (+)   Anesthesia Other Findings Past Medical History: No date: A-fib (HCC) No date: CHF (congestive heart failure) (HCC) No date: COPD (chronic obstructive pulmonary disease) (HCC) No date: DVT (deep venous thrombosis) (HCC) No date: Hypertension No date: Peripheral arterial disease (HCC) No date: Right-sided heart failure (HCC) No date: Smoker  Past Surgical History: No date: KNEE ARTHROSCOPY; Left 01/21/2020: LOWER EXTREMITY ANGIOGRAPHY; Left     Comment:  Procedure: Lower Extremity Angiography;  Surgeon:               Jama Cordella MATSU, MD;  Location: [facility];               Service: Cardiovascular;  Laterality: Left; 09/08/2021: LOWER EXTREMITY ANGIOGRAPHY; Left     Comment:  Procedure: Lower Extremity Angiography;  Surgeon: Marea Selinda RAMAN, MD;  Location: [facility];  Service:               Cardiovascular;  Laterality: Left; 06/20/2022: LOWER EXTREMITY ANGIOGRAPHY; Left     Comment:  Procedure: Lower Extremity Angiography;  Surgeon: Marea Selinda RAMAN, MD;  Location: [facility];  Service:               Cardiovascular;  Laterality:  Left; No date: ORIF FEMUR FRACTURE; Right 01/30/2023: RIGHT/LEFT HEART CATH AND CORONARY ANGIOGRAPHY; N/A     Comment:  Procedure: RIGHT/LEFT HEART CATH AND CORONARY               ANGIOGRAPHY;  Surgeon: Mady Bruckner, MD;  Location:               [facility];  Service: Cardiovascular;                Laterality: N/A;  BMI    Body Mass Index: 18.25 kg/m      Reproductive/Obstetrics negative OB ROS                              Anesthesia Physical Anesthesia Plan  ASA: 3  Anesthesia Plan: General ETT   Post-op Pain Management:    Induction: Intravenous  PONV Risk Score and Plan: Ondansetron , Dexamethasone , Midazolam  and Treatment may vary due to age or medical condition  Airway Management Planned: Oral ETT  Additional Equipment: Arterial line  Intra-op Plan:   Post-operative Plan: Extubation in OR  Informed Consent: I have reviewed the patients History and Physical, chart, labs and discussed the procedure including the risks,  benefits and alternatives for the proposed anesthesia with the patient or authorized representative who has indicated his/her understanding and acceptance.     Dental Advisory Given  Plan Discussed with: Anesthesiologist, CRNA and Surgeon  Anesthesia Plan Comments: (Patient consented for risks of anesthesia including but not limited to:  - adverse reactions to medications - damage to eyes, teeth, lips or other oral mucosa - nerve damage due to positioning  - sore throat or hoarseness - Damage to heart, brain, nerves, lungs, other parts of body or loss of life  Patient voiced understanding and assent.)        Anesthesia Quick Evaluation

## 2023-10-20 NOTE — Assessment & Plan Note (Signed)
-   Will continue Eliquis and Coreg.

## 2023-10-20 NOTE — Assessment & Plan Note (Addendum)
-   The patient patient will be admitted to the medical telemetry bed. - The patient will be continued on IV heparin . - Will place him on p.o. Pletal. - Will continue Plavix . - Pain management will be provided. - Vascular surgery consult will be obtained. - Dr. Marea was notified about the patient.

## 2023-10-20 NOTE — Assessment & Plan Note (Signed)
-   Will continue PPI therapy.

## 2023-10-20 NOTE — Plan of Care (Signed)

## 2023-10-20 NOTE — Progress Notes (Signed)
 ANTICOAGULATION CONSULT NOTE  Pharmacy Consult for heparin  infusion Indication: Limb ischemia (Hx of DVT on Eliquis )  Patient Measurements: Height: 5' 8 (172.7 cm) Weight: 54.4 kg (120 lb) IBW/kg (Calculated) : 68.4 HEPARIN  DW (KG): 54.4  Labs: Recent Labs    10/20/23 0002 10/20/23 0427 10/20/23 0944  HGB 12.8* 12.0*  --   HCT 40.8 37.8*  --   PLT 185 178  --   APTT 28  --  93*  LABPROT 13.3  --   --   INR 1.0  --   --   HEPARINUNFRC 0.52  --   --   CREATININE 1.13 0.98  --     Estimated Creatinine Clearance: 57.8 mL/min (by C-G formula based on SCr of 0.98 mg/dL).   Medical History: Past Medical History:  Diagnosis Date   A-fib (HCC)    CHF (congestive heart failure) (HCC)    COPD (chronic obstructive pulmonary disease) (HCC)    DVT (deep venous thrombosis) (HCC)    Hypertension    Peripheral arterial disease (HCC)    Right-sided heart failure (HCC)    Smoker     Medications:  PTA Meds: Eliquis  5 mg BID, last dose ~ 4 days ago (forgot med while traveling). Baseline HL 0.52 (still elevated), aPTT 28  Assessment: Pt is a 63 yo male with hx of blood clots on Eliquis , presenting to ED c/o left lower leg pain.  Baseline labs: INR 1, aPTT 28, heparin  level 0.52  0919 aPTT 93s, thera x 1; 900 un/hr  Goal of Therapy:  Heparin  level 0.3-0.7 units/ml aPTT 66-102 seconds Monitor platelets by anticoagulation protocol: Yes   Plan:  --aPTT is therapeutic x 1 --Continue heparin  infusion at 900 units/hr --Re-check confirmatory aPTT in 6 hours --Daily CBC per protocol while on IV heparin  --Check HL tomorrow AM to evaluate correlation  Marolyn KATHEE Mare 10/20/2023 10:30 AM

## 2023-10-20 NOTE — ED Notes (Deleted)
 IV team came to do insertion. Per RN he was able to get a 22 ga IV in pt's upper right arm, but IV does not draw blood. Pt's morning labs have not been obtained at this time. Lab called to see if they had staff available for lab draw and was informed that no one would be available until after shift change.

## 2023-10-20 NOTE — ED Notes (Signed)
 Called CCMD for pt monitoring

## 2023-10-20 NOTE — Progress Notes (Signed)
 PROGRESS NOTE    Christopher Bryan.  FMW:[id number] DOB: November 25, 1960 DOA: 10/19/2023 [facility]CP: [facility]   Assessment & Plan:   Principal Problem:   Critical limb ischemia of left lower extremity (HCC) Active Problems:   Chronic diastolic CHF (congestive heart failure) (HCC)   Dyslipidemia   GERD without esophagitis   BPH (benign prostatic hyperplasia)   Paroxysmal atrial fibrillation (HCC)  Assessment and Plan: Critical limb ischemia of left lower extremity: possibly secondary to pt not taking eliquis , plavix  x 8 days when he went out of town (accidentally left them). Will go for LLE angio as per vasc surg. Continue on IV heparin  drip. Percocet, dilaudid  prn    Chronic diastolic CHF: continue on home dose of coreg , entresto , aldactone , lasix , jardiance . Monitor I/Os    HLD: continue on statin    PAF: continue on home dose of coreg , eliquis     BPH: continue on flomax    GERD: continue on PPI       DVT prophylaxis: IV  heparin  drip Code Status: full  Family Communication:  Disposition Plan: likely d/c back home  Level of care: Telemetry Medical  Status is: Inpatient Remains inpatient appropriate because: LLE angio today     Consultants:  Vasc surg   Procedures:   Antimicrobials:   Subjective: Pt c/o left leg pain   Objective: Vitals:   10/20/23 0220 10/20/23 0400 10/20/23 0849 10/20/23 0858  BP:  102/62 111/66 111/66  Pulse:  61 (!) 58 77  Resp:  11  16  Temp: 97.8 F (36.6 C) 98 F (36.7 C)    TempSrc: Oral Oral    SpO2:  94%  100%  Weight:      Height:       No intake or output data in the 24 hours ending 10/20/23 0939 Filed Weights   10/19/23 2105  Weight: 54.4 kg    Examination:  General exam: Appears uncomfortable  Respiratory system: Clear to auscultation. Respiratory effort normal. Cardiovascular system: S1 & S2+. No rubs, gallops or clicks. Gastrointestinal system: Abdomen is nondistended, soft and nontender. Normal  bowel sounds heard. Central nervous system: Alert and oriented. Moves all extremities  Psychiatry: Judgement and insight appear normal. Mood & affect appropriate.     Data Reviewed: I have personally reviewed following labs and imaging studies  CBC: Recent Labs  Lab 10/20/23 0002 10/20/23 0427  WBC 3.2* 3.4*  NEUTROABS 0.8*  --   HGB 12.8* 12.0*  HCT 40.8 37.8*  MCV 91.3 91.5  PLT 185 178   Basic Metabolic Panel: Recent Labs  Lab 10/20/23 0002 10/20/23 0427  NA 146* 144  K 3.7 3.8  CL 111 112*  CO2 27 27  GLUCOSE 122* 129*  BUN 18 19  CREATININE 1.13 0.98  CALCIUM  8.9 8.5*   GFR: Estimated Creatinine Clearance: 57.8 mL/min (by C-G formula based on SCr of 0.98 mg/dL). Liver Function Tests: No results for input(s): AST, ALT, ALKPHOS, BILITOT, PROT, ALBUMIN in the last 168 hours. No results for input(s): LIPASE, AMYLASE in the last 168 hours. No results for input(s): AMMONIA in the last 168 hours. Coagulation Profile: Recent Labs  Lab 10/20/23 0002  INR 1.0   Cardiac Enzymes: No results for input(s): CKTOTAL, CKMB, CKMBINDEX, TROPONINI in the last 168 hours. BNP (last 3 results) No results for input(s): PROBNP in the last 8760 hours. HbA1C: No results for input(s): HGBA1C in the last 72 hours. CBG: No results for input(s): GLUCAP in the last 168  hours. Lipid Profile: No results for input(s): CHOL, HDL, LDLCALC, TRIG, CHOLHDL, LDLDIRECT in the last 72 hours. Thyroid Function Tests: No results for input(s): TSH, T4TOTAL, FREET4, T3FREE, THYROIDAB in the last 72 hours. Anemia Panel: No results for input(s): VITAMINB12, FOLATE, FERRITIN, TIBC, IRON, RETICCTPCT in the last 72 hours. Sepsis Labs: No results for input(s): PROCALCITON, LATICACIDVEN in the last 168 hours.  No results found for this or any previous visit (from the past 240 hours).       Radiology Studies: No results  found.      Scheduled Meds:  atorvastatin   20 mg Oral QHS   carvedilol   12.5 mg Oral BID WC   cholecalciferol   2,000 Units Oral Daily   clopidogrel   75 mg Oral Daily   empagliflozin   10 mg Oral QAC breakfast   gabapentin   600 mg Oral TID   lidocaine   1 patch Transdermal Q24H   melatonin  2.5 mg Oral QHS   mirtazapine   7.5 mg Oral QHS   pantoprazole   20 mg Oral Daily   predniSONE   50 mg Oral Q6H   sacubitril -valsartan   1 tablet Oral BID   spironolactone   12.5 mg Oral Daily   umeclidinium bromide   1 puff Inhalation Daily   Continuous Infusions:  sodium chloride  100 mL/hr at 10/20/23 0223   heparin  900 Units/hr (10/20/23 0129)     LOS: 0 days       Anthony CHRISTELLA Pouch, MD [facility] Pager [phone]  If 7PM-7AM, please contact night-coverage [email or web address] 10/20/2023, 9:39 AM

## 2023-10-21 DIAGNOSIS — I70222 Atherosclerosis of native arteries of extremities with rest pain, left leg: Secondary | ICD-10-CM | POA: Diagnosis not present

## 2023-10-21 LAB — BASIC METABOLIC PANEL WITH GFR
Anion gap: 8 (ref 5–15)
BUN: 20 mg/dL (ref 8–23)
CO2: 24 mmol/L (ref 22–32)
Calcium: 8.3 mg/dL — ABNORMAL LOW (ref 8.9–10.3)
Chloride: 107 mmol/L (ref 98–111)
Creatinine, Ser: 1.31 mg/dL — ABNORMAL HIGH (ref 0.61–1.24)
GFR, Estimated: >60 mL/min (ref >60)
Glucose, Bld: 106 mg/dL — ABNORMAL HIGH (ref 70–99)
Potassium: 4.6 mmol/L (ref 3.5–5.1)
Sodium: 139 mmol/L (ref 135–145)

## 2023-10-21 LAB — CBC
HCT: 35.9 % — ABNORMAL LOW (ref 39.0–52.0)
Hemoglobin: 11.3 g/dL — ABNORMAL LOW (ref 13.0–17.0)
MCH: 28.8 pg (ref 26.0–34.0)
MCHC: 31.5 g/dL (ref 30.0–36.0)
MCV: 91.6 fL (ref 80.0–100.0)
Platelets: 165 K/uL (ref 150–400)
RBC: 3.92 MIL/uL — ABNORMAL LOW (ref 4.22–5.81)
RDW: 14 % (ref 11.5–15.5)
WBC: 5.6 K/uL (ref 4.0–10.5)
nRBC: 0 % (ref 0.0–0.2)

## 2023-10-21 LAB — HEPARIN LEVEL (UNFRACTIONATED): Heparin Unfractionated: 0.33 [IU]/mL (ref 0.30–0.70)

## 2023-10-21 MED ORDER — TAMSULOSIN HCL 0.4 MG PO CAPS
0.4000 mg | ORAL_CAPSULE | Freq: Every day | ORAL | Status: DC
  Administered 2023-10-21 – 2023-10-22 (×2): 0.4 mg via ORAL
  Filled 2023-10-21 (×2): qty 1

## 2023-10-21 MED ORDER — ALUM & MAG HYDROXIDE-SIMETH 200-200-20 MG/5ML PO SUSP
30.0000 mL | ORAL | Status: DC | PRN
  Administered 2023-10-21 – 2023-10-22 (×2): 30 mL via ORAL
  Filled 2023-10-21 (×2): qty 30

## 2023-10-21 NOTE — Plan of Care (Signed)

## 2023-10-21 NOTE — Progress Notes (Signed)
 ANTICOAGULATION CONSULT NOTE  Pharmacy Consult for heparin  infusion Indication: Limb ischemia (Hx of DVT on Eliquis )  Patient Measurements: Height: 5' 8 (172.7 cm) Weight: 54.4 kg (120 lb) IBW/kg (Calculated) : 68.4 HEPARIN  DW (KG): 54.4  Labs: Recent Labs    10/20/23 0002 10/20/23 0427 10/20/23 0944 10/21/23 0445 10/21/23 1510  HGB 12.8* 12.0*  --  11.3*  --   HCT 40.8 37.8*  --  35.9*  --   PLT 185 178  --  165  --   APTT 28  --  93*  --   --   LABPROT 13.3  --   --   --   --   INR 1.0  --   --   --   --   HEPARINUNFRC 0.52  --   --   --  0.33  CREATININE 1.13 0.98  --  1.31*  --     Estimated Creatinine Clearance: 43.3 mL/min (A) (by C-G formula based on SCr of 1.31 mg/dL (H)).   Medical History: Past Medical History:  Diagnosis Date   A-fib (HCC)    CHF (congestive heart failure) (HCC)    COPD (chronic obstructive pulmonary disease) (HCC)    DVT (deep venous thrombosis) (HCC)    Hypertension    Peripheral arterial disease (HCC)    Right-sided heart failure (HCC)    Smoker     Medications:  PTA Meds: Eliquis  5 mg BID, last dose ~ 4 days ago (forgot med while traveling). Baseline HL 0.52 (still elevated), aPTT 28  Assessment: Pt is a 63 yo male with hx of blood clots on Eliquis , presenting to ED c/o left lower leg pain.  Baseline labs: INR 1, aPTT 28, heparin  level 0.52  0919 aPTT 93s, thera x 1; 900 un/hr  Goal of Therapy:  Heparin  level 0.3-0.7 units/ml aPTT 66-102 seconds Monitor platelets by anticoagulation protocol: Yes   Date Time aPTT/HL Rate/Comment  0919 1028 apTT 93  Therapeutic x1 @ 900 units/hr 0920 1510 HL 0.33 Therapeutic x2 @ 900 units/hr   Plan:  -- Heparin  Level therapeutic and correlates with aPTT --Continue heparin  infusion at 900 units/hr --Re-check HL daily with AM labs  --Daily CBC per protocol while on IV heparin   Annabella LOISE Banks, PharmD Clinical Pharmacist 10/21/2023 4:38 PM

## 2023-10-21 NOTE — Progress Notes (Signed)
 Checked on pt at 1918 noted pt nor spouse in room nor in halls of 2A. Security called to search for pt. Pt noted back on 2A hall ambulating holding on and pushing IV pole. No distress noted with pt. Spouse at side ambulating with pt.

## 2023-10-21 NOTE — Progress Notes (Signed)
 [facility]  Daily Progress Note   Subjective  -   He feels very well with just a small amount of intermittent numbness in the left little toe.  Feet feel warm.  He is pleased.  Objective Vitals:   10/20/23 2328 10/21/23 0354 10/21/23 0856 10/21/23 1113  BP: (!) 121/54 126/60 (!) 124/59 (!) 101/48  Pulse: 60 (!) 59 68 77  Resp: 19 19 18 16   Temp: 98.7 F (37.1 C) 98.4 F (36.9 C) 97.8 F (36.6 C) 98.7 F (37.1 C)  TempSrc:   Oral   SpO2: 97% 95% 95% 91%  Weight:      Height:        Intake/Output Summary (Last 24 hours) at 10/21/2023 1402 Last data filed at 10/21/2023 1200 Gross per 24 hour  Intake 364.18 ml  Output 850 ml  Net -485.82 ml    PULM  CTAB CV  RRR VASC  Femoral pulses.  Warm feet.  Doppler signals present.  Motor and sensory intact.  Laboratory CBC    Component Value Date/Time   WBC 5.6 10/21/2023 0445   HGB 11.3 (L) 10/21/2023 0445   HCT 35.9 (L) 10/21/2023 0445   PLT 165 10/21/2023 0445    BMET    Component Value Date/Time   NA 139 10/21/2023 0445   NA 145 (H) 04/04/2023 1050   K 4.6 10/21/2023 0445   CL 107 10/21/2023 0445   CO2 24 10/21/2023 0445   GLUCOSE 106 (H) 10/21/2023 0445   BUN 20 10/21/2023 0445   BUN 11 04/04/2023 1050   CREATININE 1.31 (H) 10/21/2023 0445   CALCIUM  8.3 (L) 10/21/2023 0445   GFRNONAA >60 10/21/2023 0445   GFRAA >60 01/11/2018 0356    Assessment/Planning: POD #1 s/p fem-fem thrombectomy and left PFA DCB PTA  Fem-fem is patent and legs feel good.  On ASA/Plavix  and low dose Eliquis .  Doing well.  Likely discharge Monday.   Norleen LITTIE Pereyra  10/21/2023, 2:02 PM

## 2023-10-21 NOTE — Progress Notes (Signed)
 PROGRESS NOTE    Christopher Bryan.  FMW:[id number] DOB: Sep 04, 1960 DOA: 10/19/2023 PCP: [facility]   Assessment & Plan:   Principal Problem:   Critical limb ischemia of left lower extremity (HCC) Active Problems:   Chronic diastolic CHF (congestive heart failure) (HCC)   Dyslipidemia   GERD without esophagitis   BPH (benign prostatic hyperplasia)   Paroxysmal atrial fibrillation (HCC)  Assessment and Plan: Critical limb ischemia of left lower extremity: possibly secondary to pt not taking eliquis , plavix  x 8 days when he went out of town (accidentally left them). S/p LLE angio in which angioplasty was done as per vasc surg. Continue on IV heparin  drip. LLE pain is much improved but still w/ some numbness in great toe. Percocet, dilaudid  prn    Chronic diastolic CHF: continue on home dose of lasix , aldactone , coreg , entresto , jardiance . Monitor I/Os    HLD: continue on statin    PAF: continue on coreg . Eliquis  on hold while on IV heparin  drip    BPH: continue on flomax    GERD: continue on PPI       DVT prophylaxis: IV  heparin  drip Code Status: full  Family Communication:  Disposition Plan: likely d/c back home  Level of care: Progressive Cardiac  Status is: Inpatient Remains inpatient appropriate because: requiring IV heparin  drip still     Consultants:  Vasc surg   Procedures:   Antimicrobials:   Subjective: Pt c/o left great toe numbness  Objective: Vitals:   10/20/23 2010 10/20/23 2328 10/21/23 0354 10/21/23 0856  BP: (!) 113/56 (!) 121/54 126/60 (!) 124/59  Pulse: 76 60 (!) 59 68  Resp: 19 19 19 18   Temp: 98.4 F (36.9 C) 98.7 F (37.1 C) 98.4 F (36.9 C) 97.8 F (36.6 C)  TempSrc:    Oral  SpO2: 95% 97% 95% 95%  Weight:      Height:        Intake/Output Summary (Last 24 hours) at 10/21/2023 0936 Last data filed at 10/21/2023 0856 Gross per 24 hour  Intake 184.18 ml  Output 625 ml  Net -440.82 ml   Filed Weights    10/19/23 2105  Weight: 54.4 kg    Examination:  General exam: appears calm & comfortable  Respiratory system: clear breath sounds b/l  Cardiovascular system: S1/S2+. No rubs or clicks  Gastrointestinal system: abd is soft, NT, ND & hypoactive bowel sounds  Central nervous system: alert & oriented. Moves all extremities  Psychiatry: Judgement and insight appears normal. Appropriate mood and affect    Data Reviewed: I have personally reviewed following labs and imaging studies  CBC: Recent Labs  Lab 10/20/23 0002 10/20/23 0427 10/21/23 0445  WBC 3.2* 3.4* 5.6  NEUTROABS 0.8*  --   --   HGB 12.8* 12.0* 11.3*  HCT 40.8 37.8* 35.9*  MCV 91.3 91.5 91.6  PLT 185 178 165   Basic Metabolic Panel: Recent Labs  Lab 10/20/23 0002 10/20/23 0427 10/21/23 0445  NA 146* 144 139  K 3.7 3.8 4.6  CL 111 112* 107  CO2 27 27 24   GLUCOSE 122* 129* 106*  BUN 18 19 20   CREATININE 1.13 0.98 1.31*  CALCIUM  8.9 8.5* 8.3*   GFR: Estimated Creatinine Clearance: 43.3 mL/min (A) (by C-G formula based on SCr of 1.31 mg/dL (H)). Liver Function Tests: No results for input(s): AST, ALT, ALKPHOS, BILITOT, PROT, ALBUMIN in the last 168 hours. No results for input(s): LIPASE, AMYLASE in the last 168 hours. No  results for input(s): AMMONIA in the last 168 hours. Coagulation Profile: Recent Labs  Lab 10/20/23 0002  INR 1.0   Cardiac Enzymes: No results for input(s): CKTOTAL, CKMB, CKMBINDEX, TROPONINI in the last 168 hours. BNP (last 3 results) No results for input(s): PROBNP in the last 8760 hours. HbA1C: No results for input(s): HGBA1C in the last 72 hours. CBG: No results for input(s): GLUCAP in the last 168 hours. Lipid Profile: No results for input(s): CHOL, HDL, LDLCALC, TRIG, CHOLHDL, LDLDIRECT in the last 72 hours. Thyroid Function Tests: No results for input(s): TSH, T4TOTAL, FREET4, T3FREE, THYROIDAB in the last 72  hours. Anemia Panel: No results for input(s): VITAMINB12, FOLATE, FERRITIN, TIBC, IRON, RETICCTPCT in the last 72 hours. Sepsis Labs: No results for input(s): PROCALCITON, LATICACIDVEN in the last 168 hours.  No results found for this or any previous visit (from the past 240 hours).       Radiology Studies: PERIPHERAL VASCULAR CATHETERIZATION Result Date: 10/20/2023 See surgical note for result.       Scheduled Meds:  atorvastatin   20 mg Oral QHS   carvedilol   12.5 mg Oral BID WC   cholecalciferol   2,000 Units Oral Daily   clopidogrel   75 mg Oral Daily   empagliflozin   10 mg Oral QAC breakfast   gabapentin   600 mg Oral TID   lidocaine   1 patch Transdermal Q24H   melatonin  2.5 mg Oral QHS   mirtazapine   7.5 mg Oral QHS   pantoprazole   20 mg Oral Daily   sacubitril -valsartan   1 tablet Oral BID   sodium chloride  flush  3 mL Intravenous Q12H   spironolactone   12.5 mg Oral Daily   umeclidinium bromide   1 puff Inhalation Daily   Continuous Infusions:  sodium chloride      heparin  900 Units/hr (10/21/23 0857)     LOS: 1 day       Christopher CHRISTELLA Pouch, Christopher Bryan [facility] Pager [phone]  If 7PM-7AM, please contact night-coverage [email or web address] 10/21/2023, 9:36 AM

## 2023-10-22 DIAGNOSIS — I70222 Atherosclerosis of native arteries of extremities with rest pain, left leg: Secondary | ICD-10-CM | POA: Diagnosis not present

## 2023-10-22 LAB — HEPARIN LEVEL (UNFRACTIONATED)
Heparin Unfractionated: 0.22 [IU]/mL — ABNORMAL LOW (ref 0.30–0.70)
Heparin Unfractionated: 0.45 [IU]/mL (ref 0.30–0.70)
Heparin Unfractionated: 0.45 [IU]/mL (ref 0.30–0.70)

## 2023-10-22 LAB — BASIC METABOLIC PANEL WITH GFR
Anion gap: 8 (ref 5–15)
BUN: 27 mg/dL — ABNORMAL HIGH (ref 8–23)
CO2: 28 mmol/L (ref 22–32)
Calcium: 8.6 mg/dL — ABNORMAL LOW (ref 8.9–10.3)
Chloride: 105 mmol/L (ref 98–111)
Creatinine, Ser: 1.08 mg/dL (ref 0.61–1.24)
GFR, Estimated: >60 mL/min (ref >60)
Glucose, Bld: 113 mg/dL — ABNORMAL HIGH (ref 70–99)
Potassium: 3.6 mmol/L (ref 3.5–5.1)
Sodium: 141 mmol/L (ref 135–145)

## 2023-10-22 LAB — CBC
HCT: 35.9 % — ABNORMAL LOW (ref 39.0–52.0)
Hemoglobin: 11.6 g/dL — ABNORMAL LOW (ref 13.0–17.0)
MCH: 29.2 pg (ref 26.0–34.0)
MCHC: 32.3 g/dL (ref 30.0–36.0)
MCV: 90.4 fL (ref 80.0–100.0)
Platelets: 171 K/uL (ref 150–400)
RBC: 3.97 MIL/uL — ABNORMAL LOW (ref 4.22–5.81)
RDW: 13.8 % (ref 11.5–15.5)
WBC: 7.1 K/uL (ref 4.0–10.5)
nRBC: 0 % (ref 0.0–0.2)

## 2023-10-22 MED ORDER — HEPARIN BOLUS VIA INFUSION
1000.0000 [IU] | Freq: Once | INTRAVENOUS | Status: AC
Start: 2023-10-22 — End: 2023-10-22
  Administered 2023-10-22: 1000 [IU] via INTRAVENOUS
  Filled 2023-10-22: qty 1000

## 2023-10-22 NOTE — Progress Notes (Signed)
 ANTICOAGULATION CONSULT NOTE  Pharmacy Consult for heparin  infusion Indication: Limb ischemia (Hx of DVT on Eliquis )  Patient Measurements: Height: 5' 8 (172.7 cm) Weight: 54.4 kg (120 lb) IBW/kg (Calculated) : 68.4 HEPARIN  DW (KG): 54.4  Labs: Recent Labs    10/20/23 0002 10/20/23 0427 10/20/23 0944 10/21/23 0445 10/21/23 1510 10/22/23 0528  HGB 12.8* 12.0*  --  11.3*  --  11.6*  HCT 40.8 37.8*  --  35.9*  --  35.9*  PLT 185 178  --  165  --  171  APTT 28  --  93*  --   --   --   LABPROT 13.3  --   --   --   --   --   INR 1.0  --   --   --   --   --   HEPARINUNFRC 0.52  --   --   --  0.33 0.22*  CREATININE 1.13 0.98  --  1.31*  --  1.08    Estimated Creatinine Clearance: 52.5 mL/min (by C-G formula based on SCr of 1.08 mg/dL).   Medical History: Past Medical History:  Diagnosis Date   A-fib (HCC)    CHF (congestive heart failure) (HCC)    COPD (chronic obstructive pulmonary disease) (HCC)    DVT (deep venous thrombosis) (HCC)    Hypertension    Peripheral arterial disease (HCC)    Right-sided heart failure (HCC)    Smoker     Medications:  PTA Meds: Eliquis  5 mg BID, last dose ~ 4 days ago (forgot med while traveling). Baseline HL 0.52 (still elevated), aPTT 28  Assessment: Pt is a 63 yo male with hx of blood clots on Eliquis , presenting to ED c/o left lower leg pain. Critical limb ischemia of left lower extremity: possibly secondary to pt not taking eliquis .  S/p fem-fem thrombectomy and left PFA DCB PTA. CBC stable.    Goal of Therapy:  Heparin  level 0.3-0.7 units/ml Monitor platelets by anticoagulation protocol: Yes   Date Time aPTT/HL Rate/Comment  0919 1028 apTT 93  Therapeutic x1 @ 900 units/hr 0920 1510 HL 0.33 Therapeutic x2 @ 900 units/hr 0921 0528 HL 0.22 Subtherapeutic    Plan:  Heparin  level is subtherapeutic. Will give heparin  bolus of 1000 units x 1 and increase heparin  infusion to 1000 units/hr. Recheck heparin  level in 6 hours. CBC  daily while on heparin .   Cathaleen GORMAN Blanch, PharmD Clinical Pharmacist 10/22/2023 7:03 AM

## 2023-10-22 NOTE — Plan of Care (Signed)

## 2023-10-22 NOTE — Progress Notes (Signed)
 ANTICOAGULATION CONSULT NOTE  Pharmacy Consult for heparin  infusion Indication: Limb ischemia (Hx of DVT on Eliquis )  Patient Measurements: Height: 5' 8 (172.7 cm) Weight: 54.4 kg (120 lb) IBW/kg (Calculated) : 68.4 HEPARIN  DW (KG): 54.4  Labs: Recent Labs    10/20/23 0002 10/20/23 0427 10/20/23 0944 10/21/23 0445 10/21/23 1510 10/22/23 0528 10/22/23 1422  HGB 12.8* 12.0*  --  11.3*  --  11.6*  --   HCT 40.8 37.8*  --  35.9*  --  35.9*  --   PLT 185 178  --  165  --  171  --   APTT 28  --  93*  --   --   --   --   LABPROT 13.3  --   --   --   --   --   --   INR 1.0  --   --   --   --   --   --   HEPARINUNFRC 0.52  --   --   --  0.33 0.22* 0.45  CREATININE 1.13 0.98  --  1.31*  --  1.08  --     Estimated Creatinine Clearance: 52.5 mL/min (by C-G formula based on SCr of 1.08 mg/dL).   Medical History: Past Medical History:  Diagnosis Date   A-fib (HCC)    CHF (congestive heart failure) (HCC)    COPD (chronic obstructive pulmonary disease) (HCC)    DVT (deep venous thrombosis) (HCC)    Hypertension    Peripheral arterial disease (HCC)    Right-sided heart failure (HCC)    Smoker     Medications:  PTA Meds: Eliquis  5 mg BID, last dose ~ 4 days ago (forgot med while traveling). Baseline HL 0.52 (still elevated), aPTT 28  Assessment: Pt is a 63 yo male with hx of blood clots on Eliquis , presenting to ED c/o left lower leg pain. Critical limb ischemia of left lower extremity: possibly secondary to pt not taking eliquis .  S/p fem-fem thrombectomy and left PFA DCB PTA. CBC stable.    Goal of Therapy:  Heparin  level 0.3-0.7 units/ml Monitor platelets by anticoagulation protocol: Yes   Date Time aPTT/HL Rate/Comment  0919 1028 apTT 93  Therapeutic x1 @ 900 units/hr 0920 1510 HL 0.33 Therapeutic x2 @ 900 units/hr 0921 0528 HL 0.22 Subtherapeutic   0921  1422 HL 0.45 Therapeutic x 1 @ 1000 units/hr   Plan:  - Will continue current rate of heparin  infusion - Will  recheck heparin  level in 6 hours.  - Will check CBC daily while on heparin .    Ransom Blanch PGY-1 Pharmacy Resident  [place] [facility]  10/22/2023 3:07 PM

## 2023-10-22 NOTE — Progress Notes (Signed)
 PROGRESS NOTE    Christopher Bryan.  FMW:[id number] DOB: Apr 22, 1960 DOA: 10/19/2023 PCP: [facility]   Assessment & Plan:   Principal Problem:   Critical limb ischemia of left lower extremity (HCC) Active Problems:   Chronic diastolic CHF (congestive heart failure) (HCC)   Dyslipidemia   GERD without esophagitis   BPH (benign prostatic hyperplasia)   Paroxysmal atrial fibrillation (HCC)  Assessment and Plan: Critical limb ischemia of left lower extremity: possibly secondary to pt not taking eliquis , plavix  x 8 days when he went out of town (accidentally left them). S/p LLE angio in which angioplasty was done as per vasc surg.Continue on IV heparin  drip. Still c/o left great toe pain. Percocet, dilaudid  prn    Chronic diastolic CHF: continue on home dose of aldactone , lasix , coreg , entresto , jardiance . Monitor I/Os  HLD: continue on statin    PAF: continue on IV heparin . Eliquis  on hold while on IV heparin  drip    BPH: continue on flomax     GERD: continue on PPI       DVT prophylaxis: IV  heparin  drip Code Status: full  Family Communication:  Disposition Plan: likely d/c back home  Level of care: Progressive Cardiac  Status is: Inpatient Remains inpatient appropriate because: requiring IV heparin  drip still     Consultants:  Vasc surg   Procedures:   Antimicrobials:   Subjective: Pt still c/o left great toe pain   Objective: Vitals:   10/21/23 2341 10/22/23 0328 10/22/23 0745 10/22/23 0800  BP: (!) 148/77 124/67 125/67 125/67  Pulse: 65 68 (!) 56 (!) 56  Resp: 18 20 18    Temp: 98.1 F (36.7 C) 98.2 F (36.8 C)    TempSrc:      SpO2: 92% 99% 100%   Weight:      Height:        Intake/Output Summary (Last 24 hours) at 10/22/2023 0919 Last data filed at 10/22/2023 0751 Gross per 24 hour  Intake 380 ml  Output 1155 ml  Net -775 ml   Filed Weights   10/19/23 2105  Weight: 54.4 kg    Examination:  General exam: appears  comfortable  Respiratory system: clear breath sounds b/l  Cardiovascular system: S1 & S2+. No rubs or clicks  Gastrointestinal system: abd is soft, NT, ND & hypoactive bowel sounds Central nervous system: alert & oriented. Moves all extremities  Psychiatry: Judgement and insight appears normal. Appropriate mood and affect    Data Reviewed: I have personally reviewed following labs and imaging studies  CBC: Recent Labs  Lab 10/20/23 0002 10/20/23 0427 10/21/23 0445 10/22/23 0528  WBC 3.2* 3.4* 5.6 7.1  NEUTROABS 0.8*  --   --   --   HGB 12.8* 12.0* 11.3* 11.6*  HCT 40.8 37.8* 35.9* 35.9*  MCV 91.3 91.5 91.6 90.4  PLT 185 178 165 171   Basic Metabolic Panel: Recent Labs  Lab 10/20/23 0002 10/20/23 0427 10/21/23 0445 10/22/23 0528  NA 146* 144 139 141  K 3.7 3.8 4.6 3.6  CL 111 112* 107 105  CO2 27 27 24 28   GLUCOSE 122* 129* 106* 113*  BUN 18 19 20  27*  CREATININE 1.13 0.98 1.31* 1.08  CALCIUM  8.9 8.5* 8.3* 8.6*   GFR: Estimated Creatinine Clearance: 52.5 mL/min (by C-G formula based on SCr of 1.08 mg/dL). Liver Function Tests: No results for input(s): AST, ALT, ALKPHOS, BILITOT, PROT, ALBUMIN in the last 168 hours. No results for input(s): LIPASE, AMYLASE in the  last 168 hours. No results for input(s): AMMONIA in the last 168 hours. Coagulation Profile: Recent Labs  Lab 10/20/23 0002  INR 1.0   Cardiac Enzymes: No results for input(s): CKTOTAL, CKMB, CKMBINDEX, TROPONINI in the last 168 hours. BNP (last 3 results) No results for input(s): PROBNP in the last 8760 hours. HbA1C: No results for input(s): HGBA1C in the last 72 hours. CBG: No results for input(s): GLUCAP in the last 168 hours. Lipid Profile: No results for input(s): CHOL, HDL, LDLCALC, TRIG, CHOLHDL, LDLDIRECT in the last 72 hours. Thyroid Function Tests: No results for input(s): TSH, T4TOTAL, FREET4, T3FREE, THYROIDAB in the last 72  hours. Anemia Panel: No results for input(s): VITAMINB12, FOLATE, FERRITIN, TIBC, IRON, RETICCTPCT in the last 72 hours. Sepsis Labs: No results for input(s): PROCALCITON, LATICACIDVEN in the last 168 hours.  No results found for this or any previous visit (from the past 240 hours).       Radiology Studies: PERIPHERAL VASCULAR CATHETERIZATION Result Date: 10/20/2023 See surgical note for result.       Scheduled Meds:  atorvastatin   20 mg Oral QHS   carvedilol   12.5 mg Oral BID WC   cholecalciferol   2,000 Units Oral Daily   clopidogrel   75 mg Oral Daily   empagliflozin   10 mg Oral QAC breakfast   gabapentin   600 mg Oral TID   lidocaine   1 patch Transdermal Q24H   melatonin  2.5 mg Oral QHS   mirtazapine   7.5 mg Oral QHS   pantoprazole   20 mg Oral Daily   sacubitril -valsartan   1 tablet Oral BID   sodium chloride  flush  3 mL Intravenous Q12H   spironolactone   12.5 mg Oral Daily   tamsulosin   0.4 mg Oral QPC supper   umeclidinium bromide   1 puff Inhalation Daily   Continuous Infusions:  heparin  1,000 Units/hr (10/22/23 0811)     LOS: 2 days       Anthony CHRISTELLA Pouch, MD [facility] Pager [phone]  If 7PM-7AM, please contact night-coverage [email or web address] 10/22/2023, 9:19 AM

## 2023-10-22 NOTE — Progress Notes (Signed)
 [facility]  Daily Progress Note   Subjective  -   He feels well and has been ambulating out of the room with success.  Intermittent left great toe numbness.  Objective Vitals:   10/22/23 0328 10/22/23 0745 10/22/23 0800 10/22/23 1200  BP: 124/67 125/67 125/67 (!) 101/58  Pulse: 68 (!) 56 (!) 56 (!) 57  Resp: 20 18  16   Temp: 98.2 F (36.8 C)   97.8 F (36.6 C)  TempSrc:      SpO2: 99% 100%  99%  Weight:      Height:        Intake/Output Summary (Last 24 hours) at 10/22/2023 1405 Last data filed at 10/22/2023 1235 Gross per 24 hour  Intake 440 ml  Output 1130 ml  Net -690 ml    PULM  CTAB CV  RRR VASC  Palpable femoral pulses.  Access site on L is fine.  Feet with Doppler flow, warm and neuro intact.  Toes all look the same and are warm.  Laboratory CBC    Component Value Date/Time   WBC 7.1 10/22/2023 0528   HGB 11.6 (L) 10/22/2023 0528   HCT 35.9 (L) 10/22/2023 0528   PLT 171 10/22/2023 0528    BMET    Component Value Date/Time   NA 141 10/22/2023 0528   NA 145 (H) 04/04/2023 1050   K 3.6 10/22/2023 0528   CL 105 10/22/2023 0528   CO2 28 10/22/2023 0528   GLUCOSE 113 (H) 10/22/2023 0528   BUN 27 (H) 10/22/2023 0528   BUN 11 04/04/2023 1050   CREATININE 1.08 10/22/2023 0528   CALCIUM  8.6 (L) 10/22/2023 0528   GFRNONAA >60 10/22/2023 0528   GFRAA >60 01/11/2018 0356    Assessment/Planning: POD #3 s/p thrombectomy fem fem and left PFA PTA (perc).  He should be ready to convert to oral medications tomorrow and possible discharge.  He is in agreement.   Norleen LITTIE Pereyra  10/22/2023, 2:05 PM

## 2023-10-22 NOTE — Progress Notes (Signed)
 ANTICOAGULATION CONSULT NOTE  Pharmacy Consult for heparin  infusion Indication: Limb ischemia (Hx of DVT on Eliquis )  Patient Measurements: Height: 5' 8 (172.7 cm) Weight: 54.4 kg (120 lb) IBW/kg (Calculated) : 68.4 HEPARIN  DW (KG): 54.4  Labs: Recent Labs    10/20/23 0002 10/20/23 0427 10/20/23 0944 10/21/23 0445 10/21/23 1510 10/22/23 0528 10/22/23 1422 10/22/23 2005  HGB 12.8* 12.0*  --  11.3*  --  11.6*  --   --   HCT 40.8 37.8*  --  35.9*  --  35.9*  --   --   PLT 185 178  --  165  --  171  --   --   APTT 28  --  93*  --   --   --   --   --   LABPROT 13.3  --   --   --   --   --   --   --   INR 1.0  --   --   --   --   --   --   --   HEPARINUNFRC 0.52  --   --   --    < > 0.22* 0.45 0.45  CREATININE 1.13 0.98  --  1.31*  --  1.08  --   --    < > = values in this interval not displayed.    Estimated Creatinine Clearance: 52.5 mL/min (by C-G formula based on SCr of 1.08 mg/dL).   Medical History: Past Medical History:  Diagnosis Date   A-fib (HCC)    CHF (congestive heart failure) (HCC)    COPD (chronic obstructive pulmonary disease) (HCC)    DVT (deep venous thrombosis) (HCC)    Hypertension    Peripheral arterial disease (HCC)    Right-sided heart failure (HCC)    Smoker     Medications:  PTA Meds: Eliquis  5 mg BID, last dose ~ 4 days ago (forgot med while traveling). Baseline HL 0.52 (still elevated), aPTT 28  Assessment: Pt is a 63 yo male with hx of blood clots on Eliquis , presenting to ED c/o left lower leg pain. Critical limb ischemia of left lower extremity: possibly secondary to pt not taking eliquis .  S/p fem-fem thrombectomy and left PFA DCB PTA. CBC stable.    Goal of Therapy:  Heparin  level 0.3-0.7 units/ml Monitor platelets by anticoagulation protocol: Yes   Date Time aPTT/HL Rate/Comment  0919 1028 apTT 93  Therapeutic x1 @ 900 units/hr 0920 1510 HL 0.33 Therapeutic x2 @ 900 units/hr 0921 0528 HL 0.22 Subtherapeutic   0921  1422 HL  0.45 Therapeutic x 1 @ 1000 units/hr  0921 2038 HL 0.45 Therapeutic x2   Plan:  - Will continue current rate of heparin  infusion - Will recheck heparin  level daily with AM labs - Will check CBC daily while on heparin .    Annabella LOISE Banks, PharmD Clinical Pharmacist 10/22/2023 9:43 PM

## 2023-10-22 NOTE — Plan of Care (Signed)
   Problem: Education: Goal: Knowledge of General Education information will improve Description: Including pain rating scale, medication(s)/side effects and non-pharmacologic comfort measures Outcome: Progressing   Problem: Health Behavior/Discharge Planning: Goal: Ability to manage health-related needs will improve Outcome: Progressing   Problem: Activity: Goal: Risk for activity intolerance will decrease Outcome: Progressing

## 2023-10-23 ENCOUNTER — Encounter: Payer: Self-pay | Admitting: Vascular Surgery

## 2023-10-23 DIAGNOSIS — I70222 Atherosclerosis of native arteries of extremities with rest pain, left leg: Secondary | ICD-10-CM | POA: Diagnosis not present

## 2023-10-23 LAB — CBC
HCT: 39.8 % (ref 39.0–52.0)
Hemoglobin: 12.4 g/dL — ABNORMAL LOW (ref 13.0–17.0)
MCH: 28.7 pg (ref 26.0–34.0)
MCHC: 31.2 g/dL (ref 30.0–36.0)
MCV: 92.1 fL (ref 80.0–100.0)
Platelets: 168 K/uL (ref 150–400)
RBC: 4.32 MIL/uL (ref 4.22–5.81)
RDW: 14.2 % (ref 11.5–15.5)
WBC: 5.5 K/uL (ref 4.0–10.5)
nRBC: 0 % (ref 0.0–0.2)

## 2023-10-23 LAB — BASIC METABOLIC PANEL WITH GFR
Anion gap: 10 (ref 5–15)
BUN: 19 mg/dL (ref 8–23)
CO2: 26 mmol/L (ref 22–32)
Calcium: 8.4 mg/dL — ABNORMAL LOW (ref 8.9–10.3)
Chloride: 106 mmol/L (ref 98–111)
Creatinine, Ser: 1.07 mg/dL (ref 0.61–1.24)
GFR, Estimated: >60 mL/min (ref >60)
Glucose, Bld: 106 mg/dL — ABNORMAL HIGH (ref 70–99)
Potassium: 3.4 mmol/L — ABNORMAL LOW (ref 3.5–5.1)
Sodium: 142 mmol/L (ref 135–145)

## 2023-10-23 LAB — HEPARIN LEVEL (UNFRACTIONATED): Heparin Unfractionated: 0.39 [IU]/mL (ref 0.30–0.70)

## 2023-10-23 MED ORDER — APIXABAN 5 MG PO TABS
5.0000 mg | ORAL_TABLET | Freq: Two times a day (BID) | ORAL | Status: DC
  Administered 2023-10-23: 5 mg via ORAL
  Filled 2023-10-23: qty 1

## 2023-10-23 MED ORDER — ATORVASTATIN CALCIUM 20 MG PO TABS: 40.0000 mg | ORAL_TABLET | Freq: Every day | ORAL | Status: DC

## 2023-10-23 MED ORDER — POTASSIUM CHLORIDE CRYS ER 20 MEQ PO TBCR
20.0000 meq | EXTENDED_RELEASE_TABLET | Freq: Once | ORAL | Status: AC
  Administered 2023-10-23: 20 meq via ORAL
  Filled 2023-10-23: qty 1

## 2023-10-23 NOTE — Plan of Care (Signed)
  Problem: Education: Goal: Knowledge of General Education information will improve Description: Including pain rating scale, medication(s)/side effects and non-pharmacologic comfort measures Outcome: Progressing   Problem: Clinical Measurements: Goal: Ability to maintain clinical measurements within normal limits will improve Outcome: Progressing Goal: Will remain free from infection Outcome: Progressing Goal: Diagnostic test results will improve Outcome: Progressing   Problem: Activity: Goal: Risk for activity intolerance will decrease Outcome: Progressing   Problem: Nutrition: Goal: Adequate nutrition will be maintained Outcome: Progressing   Problem: Coping: Goal: Level of anxiety will decrease Outcome: Progressing   Problem: Elimination: Goal: Will not experience complications related to bowel motility Outcome: Progressing Goal: Will not experience complications related to urinary retention Outcome: Progressing   Problem: Pain Managment: Goal: General experience of comfort will improve and/or be controlled Outcome: Progressing   Problem: Safety: Goal: Ability to remain free from injury will improve Outcome: Progressing   Problem: Skin Integrity: Goal: Risk for impaired skin integrity will decrease Outcome: Progressing   Problem: Activity: Goal: Ability to return to baseline activity level will improve Outcome: Progressing   Problem: Cardiovascular: Goal: Ability to achieve and maintain adequate cardiovascular perfusion will improve Outcome: Progressing   Problem: Health Behavior/Discharge Planning: Goal: Ability to safely manage health-related needs after discharge will improve Outcome: Progressing

## 2023-10-23 NOTE — Discharge Summary (Signed)
 Physician Discharge Summary  Christopher Bryan. FMW:[id number] DOB: 05/20/1960 DOA: 10/19/2023  PCP: [facility]  Admit date: 10/19/2023 Discharge date: 10/23/2023  Admitted From: home  Disposition:  home   Recommendations for Outpatient Follow-up:  Follow up with PCP in 1-2 weeks F/u w/ vasc surg, Dr. Jama, 3-4 weeks   Home Health: no  Equipment/Devices:  Discharge Condition: stable  CODE STATUS: full  Diet recommendation: Heart Healthy  Brief/Interim Summary: HPI was taken from Dr. Lawence: Christopher Bryan. is a 62 y.o. male with medical history significant for CHF, paroxysmal atrial fibrillation, DVT, essential hypertension, peripheral arterial disease, and right-sided heart failure, who presented to the emergency room with acute onset of worsening left leg pain since Tuesday which has been feeling cold today with some tenderness without erythema.  No fever or chills.  No nausea or vomiting or abdominal pain.  He continues to smoke 4 cigarettes/day.  No chest pain or palpitations.  No cough or wheezing or dyspnea.   ED Course: When he came to the ER, BP was 169/76 with otherwise normal vital signs.  Labs revealed sodium 146 and glucose 122 with otherwise unremarkable BMP.  CBC showed mild leukopenia of 3.2 and hemoglobin 12.8 and hematocrit 40.8 close to previous levels.  Coag profile was normal. EKG as reviewed by me : EKG showed normal sinus rhythm with a rate of 75 with LVH and secondary repolarization and Q waves anteroseptally. Imaging: None.   The patient was given 4 mg of IV Zofran  and 4 mg of IV morphine  sulfate and was placed on IV heparin  after discussion with Dr. Marea who is aware about the patient.  The patient will be admitted to a medical telemetry bed for further evaluation and management.  Discharge Diagnoses:  Principal Problem:   Critical limb ischemia of left lower extremity (HCC) Active Problems:   Chronic diastolic CHF (congestive heart  failure) (HCC)   Dyslipidemia   GERD without esophagitis   BPH (benign prostatic hyperplasia)   Paroxysmal atrial fibrillation (HCC) Critical limb ischemia of left lower extremity: possibly secondary to pt not taking eliquis , plavix  x 8 days when he went out of town (accidentally left them). S/p LLE angio in which angioplasty was done as per vasc surg.D/c IV heparin  drip and restart home dose of plavix , eliquis . Percocet, dilaudid  prn    Chronic diastolic CHF: continue on home dose of aldactone , lasix , coreg , entresto , jardiance . Monitor I/Os  HLD: continue on statin    PAF: restart home eliquis  & continue on coreg . D/c IV heparin  drip   BPH: continue on flomax     GERD: continue on PPI    Discharge Instructions  Discharge Instructions     Diet - low sodium heart healthy   Complete by: As directed    Discharge instructions   Complete by: As directed    F/u w/ PCP in 1-2 weeks. F/u w/ vasc surg, Dr. Jama, in 3-4 weeks   Increase activity slowly   Complete by: As directed       Allergies as of 10/23/2023       Reactions   Iodine  Rash   Ivp Dye [iodinated Contrast Media] Hives   Mushroom Other (See Comments)   Latex Hives, Itching   Iodinated Contrast Media Hives, Rash   Other Itching, Rash   [place] Meat allergy per patient        Medication List     STOP taking these medications    azithromycin  250 MG  tablet Commonly known as: ZITHROMAX        TAKE these medications    acetaminophen  325 MG tablet Commonly known as: TYLENOL  Take 650 mg by mouth every 6 (six) hours as needed.   albuterol  108 (90 Base) MCG/ACT inhaler Commonly known as: VENTOLIN  HFA Inhale 2 puffs into the lungs every 6 (six) hours as needed for wheezing or shortness of breath.   apixaban  5 MG Tabs tablet Commonly known as: ELIQUIS  Take 1 tablet (5 mg total) by mouth 2 (two) times daily.   atorvastatin  40 MG tablet Commonly known as: LIPITOR  Take 20 mg by mouth at bedtime.    carboxymethylcellulose 0.5 % Soln Commonly known as: REFRESH PLUS Place 1 drop into both eyes 4 (four) times daily as needed.   carvedilol  12.5 MG tablet Commonly known as: COREG  Take 1 tablet (12.5 mg total) by mouth 2 (two) times daily.   cholecalciferol  1000 units tablet Commonly known as: VITAMIN D  Take 2,000 Units by mouth daily.   clopidogrel  75 MG tablet Commonly known as: PLAVIX  Take 1 tablet (75 mg total) by mouth daily.   diclofenac  Sodium 1 % Gel Commonly known as: VOLTAREN  Apply 2 g topically 3 (three) times daily as needed.   Entresto  24-26 MG Generic drug: sacubitril -valsartan  Take 1 tablet by mouth 2 (two) times daily.   fluticasone 50 MCG/ACT nasal spray Commonly known as: FLONASE Place 1 spray into both nostrils daily as needed.   fluticasone-salmeterol 250-50 MCG/ACT Aepb Commonly known as: ADVAIR Inhale 1 puff into the lungs in the morning and at bedtime.   furosemide  20 MG tablet Commonly known as: LASIX  Take 1 tablet (20 mg total) by mouth daily as needed for edema (weight gain of 3 lbs in a day or 5 lbs in a week).   gabapentin  300 MG capsule Commonly known as: NEURONTIN  Take 600 mg by mouth 3 (three) times daily. TAKE TWO CAPSULES BY MOUTH THREE TIMES A DAY *START WITH 1 CAPSULE THREE TIMES A DAY AND INCREASE BY 1 CAPSULE EVERY 3 DAYS UNTIL TAKING TWO CAPSULES THREE TIMES A DAY   Jardiance  10 MG Tabs tablet Generic drug: empagliflozin  Take 1 tablet (10 mg total) by mouth daily before breakfast.   lidocaine  5 % Commonly known as: LIDODERM  Place 1 patch onto the skin daily. Remove & Discard patch within 12 hours or as directed by MD   loratadine  10 MG tablet Commonly known as: CLARITIN  Take 10 mg by mouth daily as needed.   melatonin 3 MG Tabs tablet Take 3 mg by mouth at bedtime.   methocarbamol  500 MG tablet Commonly known as: ROBAXIN  Take 500 mg by mouth every 8 (eight) hours as needed for muscle spasms.   mirtazapine  7.5 MG  tablet Commonly known as: REMERON  Take 7.5 mg by mouth at bedtime.   nicotine  polacrilex 4 MG lozenge Commonly known as: COMMIT Take 4 mg by mouth as needed. DISSOLVE 1 LOZENGE BY MOUTH AS DIRECTED FOR SMOKING CESSATION USE AT LEAST 8-10 LOZENGES EACH DAY TO START. DO NOT USE MORE THAN 20 LOZENGES IN A DAY. DO NOT EAT OR DRINK FOR 15 MINUTES BEFORE AND DURING USE   pantoprazole  20 MG tablet Commonly known as: PROTONIX  Take 20 mg by mouth daily.   polyethylene glycol powder 17 GM/SCOOP powder Commonly known as: GLYCOLAX /MIRALAX  Take 17 g by mouth daily as needed for moderate constipation.   sennosides-docusate sodium  8.6-50 MG tablet Commonly known as: SENOKOT-S Take 1-2 tablets by mouth at bedtime as needed for  constipation.   sildenafil  100 MG tablet Commonly known as: VIAGRA  Take 100 mg by mouth daily as needed for erectile dysfunction.   spironolactone  25 MG tablet Commonly known as: ALDACTONE  Take 0.5 tablets (12.5 mg total) by mouth daily.   tamsulosin  0.4 MG Caps capsule Commonly known as: FLOMAX  Take 0.4 mg by mouth daily after supper.   tiotropium 18 MCG inhalation capsule Commonly known as: SPIRIVA  Place 18 mcg into inhaler and inhale daily.        Allergies  Allergen Reactions   Iodine  Rash   Ivp Dye [Iodinated Contrast Media] Hives   Mushroom Other (See Comments)   Latex Hives and Itching   Iodinated Contrast Media Hives and Rash   Other Itching and Rash    [place] Meat allergy per patient    Consultations: Vasc surg   Procedures/Studies: PERIPHERAL VASCULAR CATHETERIZATION Result Date: 10/20/2023 See surgical note for result.  (Echo, Carotid, EGD, Colonoscopy, ERCP)    Subjective: Pt c/o fatigue    Discharge Exam: Vitals:   10/23/23 0800 10/23/23 1220  BP: 102/61 (!) 143/66  Pulse: 74 70  Resp: 16 16  Temp: 97.6 F (36.4 C) 97.6 F (36.4 C)  SpO2: 96% 97%   Vitals:   10/23/23 0020 10/23/23 0356 10/23/23 0800 10/23/23 1220  BP:  (!) 121/52 128/63 102/61 (!) 143/66  Pulse: 61 60 74 70  Resp: 20 17 16 16   Temp: 98.3 F (36.8 C) (!) 97.5 F (36.4 C) 97.6 F (36.4 C) 97.6 F (36.4 C)  TempSrc:      SpO2: 97% 94% 96% 97%  Weight:      Height:        General: Pt is alert, awake, not in acute distress. Frail appearing  Cardiovascular: S1/S2 +, no rubs, no gallops Respiratory: CTA bilaterally, no wheezing, no rhonchi Abdominal: Soft, NT, ND, bowel sounds + Extremities: no edema, no cyanosis    The results of significant diagnostics from this hospitalization (including imaging, microbiology, ancillary and laboratory) are listed below for reference.     Microbiology: No results found for this or any previous visit (from the past 240 hours).   Labs: BNP (last 3 results) Recent Labs    01/26/23 0010 02/28/23 1900 03/28/23 0014  BNP 1,004.9* 488.6* 507.8*   Basic Metabolic Panel: Recent Labs  Lab 10/20/23 0002 10/20/23 0427 10/21/23 0445 10/22/23 0528 10/23/23 0413  NA 146* 144 139 141 142  K 3.7 3.8 4.6 3.6 3.4*  CL 111 112* 107 105 106  CO2 27 27 24 28 26   GLUCOSE 122* 129* 106* 113* 106*  BUN 18 19 20  27* 19  CREATININE 1.13 0.98 1.31* 1.08 1.07  CALCIUM  8.9 8.5* 8.3* 8.6* 8.4*   Liver Function Tests: No results for input(s): AST, ALT, ALKPHOS, BILITOT, PROT, ALBUMIN in the last 168 hours. No results for input(s): LIPASE, AMYLASE in the last 168 hours. No results for input(s): AMMONIA in the last 168 hours. CBC: Recent Labs  Lab 10/20/23 0002 10/20/23 0427 10/21/23 0445 10/22/23 0528 10/23/23 0413  WBC 3.2* 3.4* 5.6 7.1 5.5  NEUTROABS 0.8*  --   --   --   --   HGB 12.8* 12.0* 11.3* 11.6* 12.4*  HCT 40.8 37.8* 35.9* 35.9* 39.8  MCV 91.3 91.5 91.6 90.4 92.1  PLT 185 178 165 171 168   Cardiac Enzymes: No results for input(s): CKTOTAL, CKMB, CKMBINDEX, TROPONINI in the last 168 hours. BNP: Invalid input(s): POCBNP CBG: No results for input(s):  GLUCAP in  the last 168 hours. D-Dimer No results for input(s): DDIMER in the last 72 hours. Hgb A1c No results for input(s): HGBA1C in the last 72 hours. Lipid Profile No results for input(s): CHOL, HDL, LDLCALC, TRIG, CHOLHDL, LDLDIRECT in the last 72 hours. Thyroid function studies No results for input(s): TSH, T4TOTAL, T3FREE, THYROIDAB in the last 72 hours.  Invalid input(s): FREET3 Anemia work up No results for input(s): VITAMINB12, FOLATE, FERRITIN, TIBC, IRON, RETICCTPCT in the last 72 hours. Urinalysis    Component Value Date/Time   COLORURINE YELLOW (A) 01/26/2023 0010   APPEARANCEUR HAZY (A) 01/26/2023 0010   LABSPEC 1.014 01/26/2023 0010   PHURINE 5.0 01/26/2023 0010   GLUCOSEU NEGATIVE 01/26/2023 0010   HGBUR NEGATIVE 01/26/2023 0010   BILIRUBINUR NEGATIVE 01/26/2023 0010   KETONESUR NEGATIVE 01/26/2023 0010   PROTEINUR 100 (A) 01/26/2023 0010   NITRITE NEGATIVE 01/26/2023 0010   LEUKOCYTESUR NEGATIVE 01/26/2023 0010   Sepsis Labs Recent Labs  Lab 10/20/23 0427 10/21/23 0445 10/22/23 0528 10/23/23 0413  WBC 3.4* 5.6 7.1 5.5   Microbiology No results found for this or any previous visit (from the past 240 hours).   Time coordinating discharge: 33 minutes  SIGNED:   Anthony CHRISTELLA Pouch, MD  [facility] 10/23/2023, 2:20 PM Pager   If 7PM-7AM, please contact night-coverage [email or web address]

## 2023-10-23 NOTE — Progress Notes (Signed)
  Progress Note    10/23/2023 11:26 AM 3 Days Post-Op  Subjective:  Christopher Bryan is a 63 yo male now POD #3 from:  Procedure(s) Performed:             1.  Introduction catheter into left lower extremity 3rd order catheter placement              2.    Contrast injection left lower extremity for distal runoff             3.  Percutaneous transluminal angioplasty of the femoral to femoral bypass and the origin of the profunda femoris artery to 5 mm             4.  Celt closure right common femoral arteriotomy   Vitals:   10/23/23 0356 10/23/23 0800  BP: 128/63 102/61  Pulse: 60 74  Resp: 17 16  Temp: (!) 97.5 F (36.4 C) 97.6 F (36.4 C)  SpO2: 94% 96%   Physical Exam: Cardiac:  *** Lungs:  *** Incisions:  *** Extremities:  *** Abdomen:  *** Neurologic: ***  CBC    Component Value Date/Time   WBC 5.5 10/23/2023 0413   RBC 4.32 10/23/2023 0413   HGB 12.4 (L) 10/23/2023 0413   HCT 39.8 10/23/2023 0413   PLT 168 10/23/2023 0413   MCV 92.1 10/23/2023 0413   MCH 28.7 10/23/2023 0413   MCHC 31.2 10/23/2023 0413   RDW 14.2 10/23/2023 0413   LYMPHSABS 2.0 10/20/2023 0002   MONOABS 0.4 10/20/2023 0002   EOSABS 0.1 10/20/2023 0002   BASOSABS 0.0 10/20/2023 0002    BMET    Component Value Date/Time   NA 142 10/23/2023 0413   NA 145 (H) 04/04/2023 1050   K 3.4 (L) 10/23/2023 0413   CL 106 10/23/2023 0413   CO2 26 10/23/2023 0413   GLUCOSE 106 (H) 10/23/2023 0413   BUN 19 10/23/2023 0413   BUN 11 04/04/2023 1050   CREATININE 1.07 10/23/2023 0413   CALCIUM  8.4 (L) 10/23/2023 0413   GFRNONAA >60 10/23/2023 0413   GFRAA >60 01/11/2018 0356    INR    Component Value Date/Time   INR 1.0 10/20/2023 0002     Intake/Output Summary (Last 24 hours) at 10/23/2023 1126 Last data filed at 10/23/2023 0900 Gross per 24 hour  Intake 1099.62 ml  Output 1000 ml  Net 99.62 ml     Assessment/Plan:  63 y.o. male is s/p *** 3 Days Post-Op  *** DVT prophylaxis:   ***   Trica Usery R Laurene Melendrez [facility] 10/23/2023 11:26 AM

## 2023-10-23 NOTE — Progress Notes (Signed)
 ANTICOAGULATION CONSULT NOTE  Pharmacy Consult for heparin  infusion Indication: Limb ischemia (Hx of DVT on Eliquis )  Patient Measurements: Height: 5' 8 (172.7 cm) Weight: 54.4 kg (120 lb) IBW/kg (Calculated) : 68.4 HEPARIN  DW (KG): 54.4  Labs: Recent Labs     0000 10/20/23 0944 10/21/23 0445 10/21/23 1510 10/22/23 0528 10/22/23 1422 10/22/23 2005 10/23/23 0413  HGB   < >  --  11.3*  --  11.6*  --   --  12.4*  HCT  --   --  35.9*  --  35.9*  --   --  39.8  PLT  --   --  165  --  171  --   --  168  APTT  --  93*  --   --   --   --   --   --   HEPARINUNFRC  --   --   --    < > 0.22* 0.45 0.45 0.39  CREATININE  --   --  1.31*  --  1.08  --   --  1.07   < > = values in this interval not displayed.    Estimated Creatinine Clearance: 53 mL/min (by C-G formula based on SCr of 1.07 mg/dL).   Medical History: Past Medical History:  Diagnosis Date   A-fib (HCC)    CHF (congestive heart failure) (HCC)    COPD (chronic obstructive pulmonary disease) (HCC)    DVT (deep venous thrombosis) (HCC)    Hypertension    Peripheral arterial disease (HCC)    Right-sided heart failure (HCC)    Smoker     Medications:  PTA Meds: Eliquis  5 mg BID, last dose ~ 4 days ago (forgot med while traveling). Baseline HL 0.52 (still elevated), aPTT 28  Assessment: Pt is a 63 yo male with hx of blood clots on Eliquis , presenting to ED c/o left lower leg pain. Critical limb ischemia of left lower extremity: possibly secondary to pt not taking eliquis .  S/p fem-fem thrombectomy and left PFA DCB PTA. CBC stable.    Goal of Therapy:  Heparin  level 0.3-0.7 units/ml Monitor platelets by anticoagulation protocol: Yes   Date Time aPTT/HL Rate/Comment  0919 1028 apTT 93  Therapeutic x1 @ 900 units/hr 0920 1510 HL 0.33 Therapeutic x2 @ 900 units/hr 0921 0528 HL 0.22 Subtherapeutic   0921  1422 HL 0.45 Therapeutic x 1 @ 1000 units/hr  0921 2038 HL 0.45 Therapeutic x 2 0922 0413 HL 0.39 Therapeutic x  3  Plan:  - Will continue current rate of heparin  infusion - Will recheck heparin  level daily with AM labs - Will check CBC daily while on heparin .    Rankin CANDIE Dills, PharmD, [facility] 10/23/2023 5:40 AM

## 2024-02-09 ENCOUNTER — Emergency Department

## 2024-02-09 ENCOUNTER — Other Ambulatory Visit: Payer: Self-pay

## 2024-02-09 ENCOUNTER — Encounter
Admission: EM | Disposition: A | Discharge status: Skilled Nursing Facility | Payer: Self-pay | Source: Home / Self Care | Attending: Internal Medicine

## 2024-02-09 ENCOUNTER — Inpatient Hospital Stay
Admission: EM | Admit: 2024-02-09 | Discharge: 2024-02-12 | DRG: 271 | Disposition: A | Discharge status: Skilled Nursing Facility | Attending: Internal Medicine | Admitting: Internal Medicine

## 2024-02-09 DIAGNOSIS — I70222 Atherosclerosis of native arteries of extremities with rest pain, left leg: Secondary | ICD-10-CM | POA: Diagnosis not present

## 2024-02-09 DIAGNOSIS — Z79899 Other long term (current) drug therapy: Secondary | ICD-10-CM

## 2024-02-09 DIAGNOSIS — Z7902 Long term (current) use of antithrombotics/antiplatelets: Secondary | ICD-10-CM | POA: Diagnosis not present

## 2024-02-09 DIAGNOSIS — J449 Chronic obstructive pulmonary disease, unspecified: Secondary | ICD-10-CM | POA: Diagnosis present

## 2024-02-09 DIAGNOSIS — E7800 Pure hypercholesterolemia, unspecified: Secondary | ICD-10-CM | POA: Diagnosis present

## 2024-02-09 DIAGNOSIS — Z91014 Allergy to mammalian meats: Secondary | ICD-10-CM | POA: Diagnosis not present

## 2024-02-09 DIAGNOSIS — N179 Acute kidney failure, unspecified: Secondary | ICD-10-CM | POA: Diagnosis present

## 2024-02-09 DIAGNOSIS — I998 Other disorder of circulatory system: Secondary | ICD-10-CM | POA: Diagnosis not present

## 2024-02-09 DIAGNOSIS — Z91148 Patient's other noncompliance with medication regimen for other reason: Secondary | ICD-10-CM | POA: Diagnosis not present

## 2024-02-09 DIAGNOSIS — F17210 Nicotine dependence, cigarettes, uncomplicated: Secondary | ICD-10-CM | POA: Diagnosis present

## 2024-02-09 DIAGNOSIS — Z91018 Allergy to other foods: Secondary | ICD-10-CM | POA: Diagnosis not present

## 2024-02-09 DIAGNOSIS — I708 Atherosclerosis of other arteries: Secondary | ICD-10-CM | POA: Diagnosis present

## 2024-02-09 DIAGNOSIS — Z7901 Long term (current) use of anticoagulants: Secondary | ICD-10-CM | POA: Diagnosis not present

## 2024-02-09 DIAGNOSIS — F17200 Nicotine dependence, unspecified, uncomplicated: Secondary | ICD-10-CM

## 2024-02-09 DIAGNOSIS — Z7951 Long term (current) use of inhaled steroids: Secondary | ICD-10-CM

## 2024-02-09 DIAGNOSIS — T82868A Thrombosis of vascular prosthetic devices, implants and grafts, initial encounter: Secondary | ICD-10-CM | POA: Diagnosis not present

## 2024-02-09 DIAGNOSIS — I110 Hypertensive heart disease with heart failure: Secondary | ICD-10-CM | POA: Diagnosis present

## 2024-02-09 DIAGNOSIS — I5032 Chronic diastolic (congestive) heart failure: Secondary | ICD-10-CM | POA: Diagnosis present

## 2024-02-09 DIAGNOSIS — L97929 Non-pressure chronic ulcer of unspecified part of left lower leg with unspecified severity: Secondary | ICD-10-CM

## 2024-02-09 DIAGNOSIS — I70201 Unspecified atherosclerosis of native arteries of extremities, right leg: Secondary | ICD-10-CM

## 2024-02-09 DIAGNOSIS — N400 Enlarged prostate without lower urinary tract symptoms: Secondary | ICD-10-CM | POA: Diagnosis present

## 2024-02-09 DIAGNOSIS — Z86718 Personal history of other venous thrombosis and embolism: Secondary | ICD-10-CM | POA: Diagnosis not present

## 2024-02-09 DIAGNOSIS — Z555 Less than a high school diploma: Secondary | ICD-10-CM

## 2024-02-09 DIAGNOSIS — Z825 Family history of asthma and other chronic lower respiratory diseases: Secondary | ICD-10-CM

## 2024-02-09 DIAGNOSIS — Z91128 Patient's intentional underdosing of medication regimen for other reason: Secondary | ICD-10-CM

## 2024-02-09 DIAGNOSIS — Z91041 Radiographic dye allergy status: Secondary | ICD-10-CM | POA: Diagnosis not present

## 2024-02-09 DIAGNOSIS — Z8249 Family history of ischemic heart disease and other diseases of the circulatory system: Secondary | ICD-10-CM

## 2024-02-09 DIAGNOSIS — Z7984 Long term (current) use of oral hypoglycemic drugs: Secondary | ICD-10-CM | POA: Diagnosis not present

## 2024-02-09 DIAGNOSIS — Z9889 Other specified postprocedural states: Secondary | ICD-10-CM

## 2024-02-09 DIAGNOSIS — Z91040 Latex allergy status: Secondary | ICD-10-CM

## 2024-02-09 DIAGNOSIS — I480 Paroxysmal atrial fibrillation: Secondary | ICD-10-CM | POA: Diagnosis present

## 2024-02-09 HISTORY — PX: ABDOMINAL AORTOGRAM W/LOWER EXTREMITY: CATH118223

## 2024-02-09 LAB — BASIC METABOLIC PANEL WITH GFR
Anion gap: 11 (ref 5–15)
BUN: 40 mg/dL — ABNORMAL HIGH (ref 8–23)
CO2: 25 mmol/L (ref 22–32)
Calcium: 9.1 mg/dL (ref 8.9–10.3)
Chloride: 102 mmol/L (ref 98–111)
Creatinine, Ser: 2.05 mg/dL — ABNORMAL HIGH (ref 0.61–1.24)
GFR, Estimated: 35 mL/min — ABNORMAL LOW
Glucose, Bld: 107 mg/dL — ABNORMAL HIGH (ref 70–99)
Potassium: 4.7 mmol/L (ref 3.5–5.1)
Sodium: 139 mmol/L (ref 135–145)

## 2024-02-09 LAB — CBC WITH DIFFERENTIAL/PLATELET
Abs Immature Granulocytes: 0.01 K/uL (ref 0.00–0.07)
Basophils Absolute: 0 K/uL (ref 0.0–0.1)
Basophils Relative: 0 %
Eosinophils Absolute: 0.1 K/uL (ref 0.0–0.5)
Eosinophils Relative: 2 %
HCT: 41.8 % (ref 39.0–52.0)
Hemoglobin: 13.4 g/dL (ref 13.0–17.0)
Immature Granulocytes: 0 %
Lymphocytes Relative: 37 %
Lymphs Abs: 1.7 K/uL (ref 0.7–4.0)
MCH: 28.9 pg (ref 26.0–34.0)
MCHC: 32.1 g/dL (ref 30.0–36.0)
MCV: 90.3 fL (ref 80.0–100.0)
Monocytes Absolute: 0.4 K/uL (ref 0.1–1.0)
Monocytes Relative: 9 %
Neutro Abs: 2.4 K/uL (ref 1.7–7.7)
Neutrophils Relative %: 52 %
Platelets: 197 K/uL (ref 150–400)
RBC: 4.63 MIL/uL (ref 4.22–5.81)
RDW: 12.7 % (ref 11.5–15.5)
WBC: 4.7 K/uL (ref 4.0–10.5)
nRBC: 0 % (ref 0.0–0.2)

## 2024-02-09 LAB — APTT
aPTT: 32 s (ref 24–36)
aPTT: 82 s — ABNORMAL HIGH (ref 24–36)

## 2024-02-09 LAB — HEPARIN LEVEL (UNFRACTIONATED): Heparin Unfractionated: 0.92 [IU]/mL — ABNORMAL HIGH (ref 0.30–0.70)

## 2024-02-09 LAB — PROTIME-INR
INR: 1 (ref 0.8–1.2)
Prothrombin Time: 14 s (ref 11.4–15.2)

## 2024-02-09 MED ORDER — SODIUM CHLORIDE 0.9 % IV BOLUS
1000.0000 mL | Freq: Once | INTRAVENOUS | Status: AC
  Administered 2024-02-09: 1000 mL via INTRAVENOUS

## 2024-02-09 MED ORDER — ALBUTEROL SULFATE (2.5 MG/3ML) 0.083% IN NEBU
3.0000 mL | INHALATION_SOLUTION | Freq: Four times a day (QID) | RESPIRATORY_TRACT | Status: DC | PRN
  Administered 2024-02-10 (×2): 3 mL via RESPIRATORY_TRACT
  Filled 2024-02-09 (×2): qty 3

## 2024-02-09 MED ORDER — HEPARIN BOLUS VIA INFUSION
3300.0000 [IU] | Freq: Once | INTRAVENOUS | Status: AC
  Administered 2024-02-09: 3300 [IU] via INTRAVENOUS
  Filled 2024-02-09: qty 3300

## 2024-02-09 MED ORDER — ONDANSETRON HCL 4 MG/2ML IJ SOLN: 4.0000 mg | Freq: Four times a day (QID) | INTRAMUSCULAR | Status: DC | PRN

## 2024-02-09 MED ORDER — DIPHENHYDRAMINE HCL 50 MG/ML IJ SOLN
50.0000 mg | Freq: Once | INTRAMUSCULAR | Status: AC
  Administered 2024-02-09: 50 mg via INTRAVENOUS

## 2024-02-09 MED ORDER — POLYETHYLENE GLYCOL 3350 17 GM/SCOOP PO POWD: 17.0000 g | Freq: Every day | ORAL | Status: DC | PRN

## 2024-02-09 MED ORDER — PANTOPRAZOLE SODIUM 20 MG PO TBEC
20.0000 mg | DELAYED_RELEASE_TABLET | Freq: Every day | ORAL | Status: DC
  Administered 2024-02-09 – 2024-02-12 (×4): 20 mg via ORAL
  Filled 2024-02-09 (×3): qty 1

## 2024-02-09 MED ORDER — CARVEDILOL 12.5 MG PO TABS
12.5000 mg | ORAL_TABLET | Freq: Two times a day (BID) | ORAL | Status: DC
  Administered 2024-02-09 – 2024-02-12 (×7): 12.5 mg via ORAL
  Filled 2024-02-09 (×7): qty 1

## 2024-02-09 MED ORDER — METHYLPREDNISOLONE SODIUM SUCC 40 MG IJ SOLR
40.0000 mg | INTRAMUSCULAR | Status: DC
  Administered 2024-02-09 (×2): 40 mg via INTRAVENOUS
  Filled 2024-02-09: qty 1

## 2024-02-09 MED ORDER — ACETAMINOPHEN 325 MG PO TABS
650.0000 mg | ORAL_TABLET | Freq: Four times a day (QID) | ORAL | Status: DC | PRN
  Administered 2024-02-11: 650 mg via ORAL
  Filled 2024-02-09: qty 2

## 2024-02-09 MED ORDER — ONDANSETRON HCL 4 MG/2ML IJ SOLN
4.0000 mg | INTRAMUSCULAR | Status: AC
  Administered 2024-02-09: 4 mg via INTRAVENOUS
  Filled 2024-02-09: qty 2

## 2024-02-09 MED ORDER — FENTANYL CITRATE (PF) 100 MCG/2ML IJ SOLN
INTRAMUSCULAR | Status: DC | PRN
  Administered 2024-02-09 (×3): 25 ug via INTRAVENOUS

## 2024-02-09 MED ORDER — ONDANSETRON HCL 4 MG PO TABS: 4.0000 mg | ORAL_TABLET | Freq: Four times a day (QID) | ORAL | Status: DC | PRN

## 2024-02-09 MED ORDER — GABAPENTIN 300 MG PO CAPS
600.0000 mg | ORAL_CAPSULE | Freq: Three times a day (TID) | ORAL | Status: DC
  Administered 2024-02-09 – 2024-02-12 (×11): 600 mg via ORAL
  Filled 2024-02-09 (×2): qty 6
  Filled 2024-02-09 (×4): qty 2
  Filled 2024-02-09: qty 6
  Filled 2024-02-09 (×4): qty 2

## 2024-02-09 MED ORDER — SODIUM CHLORIDE 0.9 % IV SOLN: Freq: Once | INTRAVENOUS | Status: AC

## 2024-02-09 MED ORDER — METHOCARBAMOL 500 MG PO TABS: 500.0000 mg | ORAL_TABLET | Freq: Three times a day (TID) | ORAL | Status: DC | PRN

## 2024-02-09 MED ORDER — HYDROMORPHONE HCL 1 MG/ML IJ SOLN
0.5000 mg | INTRAMUSCULAR | Status: DC | PRN
  Administered 2024-02-09: 0.5 mg via INTRAVENOUS
  Filled 2024-02-09: qty 1

## 2024-02-09 MED ORDER — LIDOCAINE-EPINEPHRINE (PF) 1 %-1:200000 IJ SOLN
INTRAMUSCULAR | Status: DC | PRN
  Administered 2024-02-09: 10 mL

## 2024-02-09 MED ORDER — HYDROMORPHONE HCL 1 MG/ML IJ SOLN
0.5000 mg | INTRAMUSCULAR | Status: DC | PRN
  Administered 2024-02-09 – 2024-02-12 (×6): 1 mg via INTRAVENOUS
  Filled 2024-02-09 (×6): qty 1

## 2024-02-09 MED ORDER — UMECLIDINIUM BROMIDE 62.5 MCG/ACT IN AEPB
1.0000 | INHALATION_SPRAY | Freq: Every day | RESPIRATORY_TRACT | Status: DC
  Administered 2024-02-10 – 2024-02-12 (×3): 1 via RESPIRATORY_TRACT
  Filled 2024-02-09: qty 7

## 2024-02-09 MED ORDER — FENTANYL CITRATE (PF) 100 MCG/2ML IJ SOLN
INTRAMUSCULAR | Status: AC
  Filled 2024-02-09: qty 2

## 2024-02-09 MED ORDER — FLUTICASONE PROPIONATE 50 MCG/ACT NA SUSP: 1.0000 | Freq: Every day | NASAL | Status: DC | PRN

## 2024-02-09 MED ORDER — ATORVASTATIN CALCIUM 20 MG PO TABS
20.0000 mg | ORAL_TABLET | Freq: Every day | ORAL | Status: DC
  Administered 2024-02-09 – 2024-02-11 (×3): 20 mg via ORAL
  Filled 2024-02-09 (×3): qty 1

## 2024-02-09 MED ORDER — HEPARIN (PORCINE) IN NACL 1000-0.9 UT/500ML-% IV SOLN
INTRAVENOUS | Status: DC | PRN
  Administered 2024-02-09: 1000 mL

## 2024-02-09 MED ORDER — CEFAZOLIN SODIUM-DEXTROSE 2-4 GM/100ML-% IV SOLN
INTRAVENOUS | Status: AC
  Filled 2024-02-09: qty 100

## 2024-02-09 MED ORDER — METHYLPREDNISOLONE SODIUM SUCC 40 MG IJ SOLR
40.0000 mg | Freq: Once | INTRAMUSCULAR | Status: DC
  Filled 2024-02-09: qty 1

## 2024-02-09 MED ORDER — TIOTROPIUM BROMIDE MONOHYDRATE 18 MCG IN CAPS: 18.0000 ug | ORAL_CAPSULE | Freq: Every day | RESPIRATORY_TRACT | Status: DC

## 2024-02-09 MED ORDER — SODIUM CHLORIDE 0.9 % IV SOLN: INTRAVENOUS | Status: DC

## 2024-02-09 MED ORDER — MIRTAZAPINE 15 MG PO TABS
7.5000 mg | ORAL_TABLET | Freq: Every day | ORAL | Status: DC
  Administered 2024-02-09 – 2024-02-11 (×3): 7.5 mg via ORAL
  Filled 2024-02-09 (×3): qty 1

## 2024-02-09 MED ORDER — FLUTICASONE FUROATE-VILANTEROL 200-25 MCG/ACT IN AEPB
1.0000 | INHALATION_SPRAY | Freq: Every day | RESPIRATORY_TRACT | Status: DC
  Administered 2024-02-10 – 2024-02-12 (×3): 1 via RESPIRATORY_TRACT
  Filled 2024-02-09: qty 28

## 2024-02-09 MED ORDER — IODIXANOL 320 MG/ML IV SOLN
INTRAVENOUS | Status: DC | PRN
  Administered 2024-02-09: 35 mL

## 2024-02-09 MED ORDER — DIPHENHYDRAMINE HCL 50 MG/ML IJ SOLN
INTRAMUSCULAR | Status: AC
  Filled 2024-02-09: qty 1

## 2024-02-09 MED ORDER — MIDAZOLAM HCL (PF) 2 MG/2ML IJ SOLN
INTRAMUSCULAR | Status: DC | PRN
  Administered 2024-02-09 (×3): 1 mg via INTRAVENOUS

## 2024-02-09 MED ORDER — TAMSULOSIN HCL 0.4 MG PO CAPS
0.4000 mg | ORAL_CAPSULE | Freq: Every day | ORAL | Status: DC
  Administered 2024-02-09 – 2024-02-11 (×3): 0.4 mg via ORAL
  Filled 2024-02-09 (×3): qty 1

## 2024-02-09 MED ORDER — HEPARIN (PORCINE) 25000 UT/250ML-% IV SOLN
850.0000 [IU]/h | INTRAVENOUS | Status: DC
  Administered 2024-02-09 – 2024-02-10 (×2): 850 [IU]/h via INTRAVENOUS
  Filled 2024-02-09 (×2): qty 250

## 2024-02-09 MED ORDER — MORPHINE SULFATE (PF) 4 MG/ML IV SOLN
4.0000 mg | Freq: Once | INTRAVENOUS | Status: AC
  Administered 2024-02-09: 4 mg via INTRAVENOUS
  Filled 2024-02-09: qty 1

## 2024-02-09 MED ORDER — LACTATED RINGERS IV BOLUS: 1000.0000 mL | Freq: Once | INTRAVENOUS | Status: DC

## 2024-02-09 MED ORDER — CEFAZOLIN SODIUM-DEXTROSE 2-4 GM/100ML-% IV SOLN
2.0000 g | INTRAVENOUS | Status: AC
  Administered 2024-02-09: 2 g via INTRAVENOUS

## 2024-02-09 MED ORDER — SENNOSIDES-DOCUSATE SODIUM 8.6-50 MG PO TABS
1.0000 | ORAL_TABLET | Freq: Every evening | ORAL | Status: DC | PRN
  Administered 2024-02-12: 1 via ORAL
  Filled 2024-02-09: qty 2

## 2024-02-09 MED ORDER — MIDAZOLAM HCL 5 MG/5ML IJ SOLN
INTRAMUSCULAR | Status: AC
  Filled 2024-02-09: qty 5

## 2024-02-09 MED ORDER — HEPARIN SODIUM (PORCINE) 1000 UNIT/ML IJ SOLN
INTRAMUSCULAR | Status: AC
  Filled 2024-02-09: qty 10

## 2024-02-09 MED ORDER — HEPARIN SODIUM (PORCINE) 5000 UNIT/ML IJ SOLN
INTRAMUSCULAR | Status: DC | PRN
  Administered 2024-02-09: 4000 [IU] via INTRAVENOUS

## 2024-02-09 MED ORDER — OXYCODONE HCL 5 MG PO TABS
5.0000 mg | ORAL_TABLET | ORAL | Status: DC | PRN
  Administered 2024-02-09 – 2024-02-12 (×2): 5 mg via ORAL
  Filled 2024-02-09 (×3): qty 1

## 2024-02-09 MED ORDER — POLYVINYL ALCOHOL 1.4 % OP SOLN: 1.0000 [drp] | Freq: Four times a day (QID) | OPHTHALMIC | Status: DC | PRN

## 2024-02-09 MED ORDER — MELATONIN 5 MG PO TABS
5.0000 mg | ORAL_TABLET | Freq: Every day | ORAL | Status: DC
  Administered 2024-02-09 – 2024-02-11 (×3): 5 mg via ORAL
  Filled 2024-02-09 (×3): qty 1

## 2024-02-09 MED ORDER — VITAMIN D 25 MCG (1000 UNIT) PO TABS
2000.0000 [IU] | ORAL_TABLET | Freq: Every day | ORAL | Status: DC
  Administered 2024-02-09 – 2024-02-12 (×4): 2000 [IU] via ORAL
  Filled 2024-02-09 (×4): qty 2

## 2024-02-09 NOTE — Progress Notes (Signed)
 ANTICOAGULATION CONSULT NOTE  Pharmacy Consult for heparin  infusion Indication: Right lower leg ischemia, hx of the same, takes Eliquis  but is not always compliant with regimen.  Allergies[1]  Patient Measurements: Height: 5' 8 (172.7 cm) Weight: 54.4 kg (120 lb) IBW/kg (Calculated) : 68.4 HEPARIN  DW (KG): 54.4  Vital Signs: Temp: 98.3 F (36.8 C) (01/09 2018) Temp Source: Oral (01/09 2018) BP: 102/84 (01/09 2112) Pulse Rate: 62 (01/09 2112)  Labs: Recent Labs    02/09/24 0139 02/09/24 0452 02/09/24 2124  HGB 13.4  --   --   HCT 41.8  --   --   PLT 197  --   --   APTT  --  32 82*  LABPROT 14.0  --   --   INR 1.0  --   --   HEPARINUNFRC  --  0.92*  --   CREATININE 2.05*  --   --     Estimated Creatinine Clearance: 27.6 mL/min (A) (by C-G formula based on SCr of 2.05 mg/dL (H)).   Medical History: Past Medical History:  Diagnosis Date   A-fib (HCC)    CHF (congestive heart failure) (HCC)    COPD (chronic obstructive pulmonary disease) (HCC)    DVT (deep venous thrombosis) (HCC)    Hypertension    Peripheral arterial disease    Right-sided heart failure (HCC)    Smoker     Medications:  PTA Meds: Eliquis  5 mg BID, non-compliant  Assessment: Pt is a 64 yo male presenting to ED c/o left foot numbness and h/o DVT noncompliant with medications  Goal of Therapy:  Heparin  level 0.3-0.7 units/ml aPTT 66-102 seconds Monitor platelets by anticoagulation protocol: Yes  01/09 2124 aPTT 82, therapeutic x 1   Plan:  Continue heparin  infusion at 850 units/hr Will follow aPTT until correlation w/ HL confirmed Will check aPTT w/ AM labs to confirm HL & CBC daily while on heparin   Rankin CANDIE Dills, PharmD, [facility] 02/09/2024 10:26 PM       [1]  Allergies Allergen Reactions   Iodine  Rash   Ivp Dye [Iodinated Contrast Media] Hives   Mushroom Other (See Comments)   Latex Hives and Itching   Iodinated Contrast Media Hives and Rash   Other Itching and Rash     Turkey Meat allergy per patient

## 2024-02-09 NOTE — ED Provider Notes (Signed)
 "  [facility] Provider Note    Event Date/Time   First MD Initiated Contact with Patient 02/09/24 0200     (approximate)   History   Numbness   HPI Christopher Bryan. is a 64 y.o. male with a long list of chronic medical issues including extensive peripheral arterial and peripheral vascular disease including multiple prior vascular interventions and prior episodes of limb ischemia as well as prior DVTs for which he takes Eliquis .  He presents tonight for intermittent symptoms for about a week of increased pain and coolness and numbness in his left leg.  It got better about 3 to 4 days ago but then got worse again over the course of the last 8 hours or so and is now severe.  He is having constant aching pain with intermittent sharp pain and spasms.  He said his foot and lower leg feels a lot cooler than it did before.  When he had a follow-up appointment about 5 days ago with his PCP at the [facility], he said it was feeling better, but now it is worse again.  The same thing happened to him a few months ago and he required emergent surgery here at [facility] by Dr. Marea.     Physical Exam   Triage Vital Signs: ED Triage Vitals  Encounter Vitals Group     BP 02/09/24 0139 122/69     Girls Systolic BP Percentile --      Girls Diastolic BP Percentile --      Boys Systolic BP Percentile --      Boys Diastolic BP Percentile --      Pulse Rate 02/09/24 0139 100     Resp 02/09/24 0139 18     Temp 02/09/24 0139 98.7 F (37.1 C)     Temp src --      SpO2 02/09/24 0139 95 %     Weight 02/09/24 0138 54.4 kg (120 lb)     Height 02/09/24 0138 1.727 m (5' 8)     Head Circumference --      Peak Flow --      Pain Score 02/09/24 0137 9     Pain Loc --      Pain Education --      Exclude from Growth Chart --     Most recent vital signs: Vitals:   02/09/24 0139  BP: 122/69  Pulse: 100  Resp: 18  Temp: 98.7 F (37.1 C)  SpO2: 95%    General: Awake, alert, pleasant and  conversant, appears quite uncomfortable and chronically ill CV:  borderline tachycardia, regular rhythm Resp:  Normal effort. Speaking easily and comfortably, no accessory muscle usage nor intercostal retractions.   Abd:  No distention.  Very thin body habitus, cachectic Other:  Patient has easily dopplerable pulse in right foot, which also has normal capillary refill and is warm all throughout the lower leg and foot.  However, the left leg feels a normal temperature at just below the knee but then starts to feel cool as approaching the foot and the foot itself feels almost cold.  Decreased capillary refill and perfusion in general.  I cannot palpate nor obtain a dopplerable pulse   ED Results / Procedures / Treatments   Labs (all labs ordered are listed, but only abnormal results are displayed) Labs Reviewed  BASIC METABOLIC PANEL WITH GFR - Abnormal; Notable for the following components:      Result Value   Glucose, Bld 107 (*)  BUN 40 (*)    Creatinine, Ser 2.05 (*)    GFR, Estimated 35 (*)    All other components within normal limits  CBC WITH DIFFERENTIAL/PLATELET  PROTIME-INR  APTT  HEPARIN  LEVEL (UNFRACTIONATED)     EKG  ED ECG REPORT I, Darleene Dome, the attending physician, personally viewed and interpreted this ECG.  Date: 02/09/2024 EKG Time: 4:49 AM Rate: 70 Rhythm: Sinus with frequent PVC QRS Axis: normal Intervals: normal ST/T Wave abnormalities: Non-specific ST segment / T-wave changes, but no clear evidence of acute ischemia. Narrative Interpretation: no definitive evidence of acute ischemia; does not meet STEMI criteria.    RADIOLOGY I independently viewed and interpreted the patient's left lower extremity ultrasound and I see no evidence of DVT.  Radiology confirmed no DVT, but did mention some superficial thrombophlebitis of the saphenous vein   PROCEDURES:  Critical Care performed: Yes, see critical care procedure note(s)  .Critical  Care  Performed by: Dome Darleene, MD Authorized by: Dome Darleene, MD   Critical care provider statement:    Critical care time (minutes):  30   Critical care time was exclusive of:  Separately billable procedures and treating other patients   Critical care was necessary to treat or prevent imminent or life-threatening deterioration of the following conditions:  Circulatory failure   Critical care was time spent personally by me on the following activities:  Development of treatment plan with patient or surrogate, evaluation of patient's response to treatment, examination of patient, obtaining history from patient or surrogate, ordering and performing treatments and interventions, ordering and review of laboratory studies, ordering and review of radiographic studies, pulse oximetry, re-evaluation of patient's condition and review of old charts     IMPRESSION / MDM / ASSESSMENT AND PLAN / ED COURSE  I reviewed the triage vital signs and the nursing notes.                              Differential diagnosis includes, but is not limited to, limb ischemia, DVT, inadequate anticoagulation, metabolic or electrolyte abnormality.  Patient's presentation is most consistent with acute presentation with potential threat to life or bodily function.  Labs/studies ordered (see ED course for additional labs and studies that may have been added later): ED EKG, left lower extremity venous ultrasound, CBC with differential, BMP, heparin  level, pro time-INR, APTT  Interventions/Medications given:  Medications  heparin  ADULT infusion 100 units/mL (25000 units/250mL) (850 Units/hr Intravenous New Bag/Given 02/09/24 0509)  methylPREDNISolone  sodium succinate  (SOLU-MEDROL ) 40 mg/mL injection 40 mg (40 mg Intravenous Given 02/09/24 0519)  morphine  (PF) 4 MG/ML injection 4 mg (4 mg Intravenous Given 02/09/24 0437)  ondansetron  (ZOFRAN ) injection 4 mg (4 mg Intravenous Given 02/09/24 0437)  0.9 %  sodium chloride   infusion ( Intravenous New Bag/Given 02/09/24 0436)  sodium chloride  0.9 % bolus 1,000 mL (0 mLs Intravenous Stopped 02/09/24 0533)  heparin  bolus via infusion 3,300 Units (3,300 Units Intravenous Bolus from Bag 02/09/24 0514)    (Note:  hospital course my include additional interventions and/or labs/studies not listed above.)   I reviewed the medical record and see prior op notes written by Dr. Jama from September 2025 as well as the patient's hospitalization discharge summary from the same visit.  He presented with almost identical symptoms.  Unfortunately he has acute chronic kidney disease/renal failure with creatinine of 2.05 and a GFR of 35 as well as a reported IV contrast dye allergy requiring pretreatment  before CT angio studies.  However, I do believe he is experiencing critical limb ischemia as he was the last time the concern was for a thrombosis of the femoral to femoral bypass graft.  I will contact vascular surgery and I have ordered heparin  per pharmacy protocol to begin additional treatment.  Morphine  4 mg IV for pain control.  I also ordered a 1 L normal saline bolus to begin rehydration     Clinical Course as of 02/09/24 0541  Fri Feb 09, 2024  [phone] Paging Dr. Serene with Vascular Surgery [CF]  [phone] Paged Dr. Serene a second time [CF]  [phone] I consulted by phone with Dr. Serene.  We discussed the case in detail.  He agreed with the plan for admission to the hospitalist service for IV heparin  and pain control as well as hydration given the acute renal failure.  The vascular team will consult and depending on how the patient is doing may either take him to the operating room, order a CTA, or both.  Given the patient's  reported IV contrast dye allergy, he agreed with pretreating the patient with steroids.  I ordered methylprednisolone  40 mg IV now and I ordered it every 4 hours as per recommended protocol since we do not know exactly when he may get the imaging study.  He will  still need diphenhydramine  1 hour prior to the imaging. [CF]  [id number] I am consulting the hospitalist team for admission.   [CF]    Clinical Course User Index [CF] Gordan Huxley, MD     FINAL CLINICAL IMPRESSION(S) / ED DIAGNOSES   Final diagnoses:  Critical limb ischemia of left lower extremity (HCC)  Acute kidney injury     Rx / DC Orders   ED Discharge Orders     None        Note:  This document was prepared using Dragon voice recognition software and may include unintentional dictation errors.   Gordan Huxley, MD 02/09/24 [phone]  "

## 2024-02-09 NOTE — ED Notes (Signed)
 Blue, green and lavender sent down

## 2024-02-09 NOTE — ED Notes (Signed)
 Unable to locate PP manually or with doppler

## 2024-02-09 NOTE — H&P (Signed)
 " History and Physical    Christopher Bryan. FMW:[id number] DOB: October 17, 1960 DOA: 02/09/2024  PCP: [facility] Patient coming from: Home  I have personally briefly reviewed patient's old medical records in [facility]  Chief Complaint: Left leg pain  HPI: Christopher Bryan. is a 64 y.o. male with medical history significant of PAD, PVD status post multiple prior vascular inventions, DVT, chronic anticoagulation who presents for intermittent claudication symptoms that have markedly increased in severity in the left leg over the past 8 hours prior to admission.  Patient also endorsed coolness and numbness of left lower extremity.  Constant aching pain.  He has been prescribed Eliquis  and Plavix  however he admits to missing several doses.  Patient had a similar episode in September 2025 and underwent angiography which revealed a occluded femoral-femoral bypass graft subsequently treated with tPA and mechanical thrombectomy.  Patient also current smoker and history of COPD.  On presentation vascular was urgently consulted who recommended heparin  gtt. and Cath Lab for angiography and thrombectomy potentially progressing to open surgical repair and limb saving effort.    ED Course: Start heparin  GTT.  Given pain medications.  Vascular consulted.  Hospitalist consulted for admission.  Review of Systems: As per HPI otherwise 14 point review of systems negative.    Past Medical History:  Diagnosis Date   A-fib [facility])    CHF (congestive heart failure) (HCC)    COPD (chronic obstructive pulmonary disease) (HCC)    DVT (deep venous thrombosis) (HCC)    Hypertension    Peripheral arterial disease    Right-sided heart failure (HCC)    Smoker     Past Surgical History:  Procedure Laterality Date   KNEE ARTHROSCOPY Left    LOWER EXTREMITY ANGIOGRAPHY Left 01/21/2020   Procedure: Lower Extremity Angiography;  Surgeon: Jama Cordella MATSU, MD;  Location: [facility] INVASIVE CV LAB;   Service: Cardiovascular;  Laterality: Left;   LOWER EXTREMITY ANGIOGRAPHY Left 09/08/2021   Procedure: Lower Extremity Angiography;  Surgeon: Marea Selinda RAMAN, MD;  Location: [facility] INVASIVE CV LAB;  Service: Cardiovascular;  Laterality: Left;   LOWER EXTREMITY ANGIOGRAPHY Left 06/20/2022   Procedure: Lower Extremity Angiography;  Surgeon: Marea Selinda RAMAN, MD;  Location: [facility] INVASIVE CV LAB;  Service: Cardiovascular;  Laterality: Left;   LOWER EXTREMITY ANGIOGRAPHY Left 10/20/2023   Procedure: Lower Extremity Angiography;  Surgeon: Jama Cordella MATSU, MD;  Location: [facility] INVASIVE CV LAB;  Service: Cardiovascular;  Laterality: Left;   ORIF FEMUR FRACTURE Right    RIGHT/LEFT HEART CATH AND CORONARY ANGIOGRAPHY N/A 01/30/2023   Procedure: RIGHT/LEFT HEART CATH AND CORONARY ANGIOGRAPHY;  Surgeon: Mady Bruckner, MD;  Location: [facility] INVASIVE CV LAB;  Service: Cardiovascular;  Laterality: N/A;     reports that he has been smoking cigarettes. He has a 20 pack-year smoking history. He has never used smokeless tobacco. He reports that he does not currently use alcohol . He reports current drug use. Frequency: 1.00 time per week. Drugs: Crack cocaine and Marijuana.  Allergies[1]  Family History  Problem Relation Age of Onset   CAD Mother    Emphysema Father     Prior to Admission medications  Medication Sig Start Date End Date Taking? Authorizing Provider  acetaminophen  (TYLENOL ) 325 MG tablet Take 650 mg by mouth every 6 (six) hours as needed. 08/07/23  Yes [provider]  albuterol  (PROVENTIL  HFA;VENTOLIN  HFA) 108 (90 Base) MCG/ACT inhaler Inhale 2 puffs into the lungs every 6 (six) hours as needed for  wheezing or shortness of breath.   Yes [provider]  apixaban  (ELIQUIS ) 5 MG TABS tablet Take 1 tablet (5 mg total) by mouth 2 (two) times daily. 06/24/22  Yes Brown, Fallon E, NP  atorvastatin  (LIPITOR ) 40 MG tablet Take 20 mg by mouth at bedtime.   Yes [provider]   carboxymethylcellulose (REFRESH PLUS) 0.5 % SOLN Place 1 drop into both eyes 4 (four) times daily as needed. 09/09/21  Yes Wieting, Richard, MD  carvedilol  (COREG ) 12.5 MG tablet Take 1 tablet (12.5 mg total) by mouth 2 (two) times daily. 06/09/23 02/09/24 Yes Furth, Cadence H, PA-C  cholecalciferol  (VITAMIN D ) 1000 units tablet Take 2,000 Units by mouth daily.   Yes [provider]  clopidogrel  (PLAVIX ) 75 MG tablet Take 1 tablet (75 mg total) by mouth daily. 09/10/21  Yes Wieting, Richard, MD  diclofenac  Sodium (VOLTAREN ) 1 % GEL Apply 2 g topically 3 (three) times daily as needed.   Yes [provider]  empagliflozin  (JARDIANCE ) 10 MG TABS tablet Take 1 tablet (10 mg total) by mouth daily before breakfast. 03/30/23  Yes Wieting, Richard, MD  fluticasone  (FLONASE ) 50 MCG/ACT nasal spray Place 1 spray into both nostrils daily as needed. 08/07/23  Yes [provider]  fluticasone -salmeterol (ADVAIR) 250-50 MCG/ACT AEPB Inhale 1 puff into the lungs in the morning and at bedtime.   Yes [provider]  furosemide  (LASIX ) 20 MG tablet Take 1 tablet (20 mg total) by mouth daily as needed for edema (weight gain of 3 lbs in a day or 5 lbs in a week). 03/30/23  Yes Wieting, Richard, MD  gabapentin  (NEURONTIN ) 300 MG capsule Take 600 mg by mouth 3 (three) times daily. TAKE TWO CAPSULES BY MOUTH THREE TIMES A DAY *START WITH 1 CAPSULE THREE TIMES A DAY AND INCREASE BY 1 CAPSULE EVERY 3 DAYS UNTIL TAKING TWO CAPSULES THREE TIMES A DAY 03/23/23  Yes [provider]  lidocaine  (LIDODERM ) 5 % Place 1 patch onto the skin daily. Remove & Discard patch within 12 hours or as directed by MD   Yes [provider]  loratadine  (CLARITIN ) 10 MG tablet Take 10 mg by mouth daily as needed. 08/07/23  Yes [provider]  melatonin 3 MG TABS tablet Take 3 mg by mouth at bedtime.   Yes [provider]  methocarbamol  (ROBAXIN ) 500 MG tablet Take 500 mg by mouth every 8  (eight) hours as needed for muscle spasms.   Yes [provider]  mirtazapine  (REMERON ) 7.5 MG tablet Take 7.5 mg by mouth at bedtime.   Yes [provider]  pantoprazole  (PROTONIX ) 20 MG tablet Take 20 mg by mouth daily.   Yes [provider]  polyethylene glycol powder (GLYCOLAX /MIRALAX ) 17 GM/SCOOP powder Take 17 g by mouth daily as needed for moderate constipation. 03/30/23  Yes Wieting, Richard, MD  sacubitril -valsartan  (ENTRESTO ) 24-26 MG Take 1 tablet by mouth 2 (two) times daily. 05/01/23  Yes Furth, Cadence H, PA-C  sennosides-docusate sodium  (SENOKOT-S) 8.6-50 MG tablet Take 1-2 tablets by mouth at bedtime as needed for constipation.   Yes [provider]  sildenafil  (VIAGRA ) 100 MG tablet Take 100 mg by mouth daily as needed for erectile dysfunction.   Yes [provider]  spironolactone  (ALDACTONE ) 25 MG tablet Take 0.5 tablets (12.5 mg total) by mouth daily. 03/31/23  Yes Wieting, Richard, MD  tamsulosin  (FLOMAX ) 0.4 MG CAPS capsule Take 0.4 mg by mouth daily after supper. 08/07/23  Yes [provider]  tiotropium (SPIRIVA ) 18 MCG inhalation capsule Place 18 mcg into inhaler and inhale daily.   Yes [provider]  nicotine  polacrilex (COMMIT) 4 MG lozenge Take 4 mg by mouth as needed. DISSOLVE 1 LOZENGE BY MOUTH AS DIRECTED FOR SMOKING CESSATION USE AT LEAST 8-10 LOZENGES EACH DAY TO START. DO NOT USE MORE THAN 20 LOZENGES IN A DAY. DO NOT EAT OR DRINK FOR 15 MINUTES BEFORE AND DURING USE Patient not taking: Reported on 10/20/2023 12/26/22   [provider]    Physical Exam: Vitals:   02/09/24 0138 02/09/24 0139 02/09/24 0540  BP:  122/69 (!) 104/57  Pulse:  100 69  Resp:  18 12  Temp:  98.7 F (37.1 C) (!) 97.4 F (36.3 C)  SpO2:  95% 91%  Weight: 54.4 kg    Height: 5' 8 (1.727 m)     General: NAD HEENT: Normocephalic, atraumatic Neck, supple, trachea midline, no tenderness Heart: Regular rate and rhythm,  S1/S2 normal, no murmurs Lungs: Clear to auscultation bilaterally, no adventitious sounds, normal work of breathing Abdomen: Soft, nontender, nondistended, positive bowel sounds Extremities: Left lower extremity cool to touch.  Nonpalpable left femoral pulse Skin: No rashes or lesions, normal color Neurologic: Cranial nerves grossly intact, sensation intact, alert and oriented x3 Psychiatric: Normal affect   Labs on Admission: I have personally reviewed following labs and imaging studies  CBC: Recent Labs  Lab 02/09/24 0139  WBC 4.7  NEUTROABS 2.4  HGB 13.4  HCT 41.8  MCV 90.3  PLT 197   Basic Metabolic Panel: Recent Labs  Lab 02/09/24 0139  NA 139  K 4.7  CL 102  CO2 25  GLUCOSE 107*  BUN 40*  CREATININE 2.05*  CALCIUM  9.1   GFR: Estimated Creatinine Clearance: 27.6 mL/min (A) (by C-G formula based on SCr of 2.05 mg/dL (H)). Liver Function Tests: No results for input(s): AST, ALT, ALKPHOS, BILITOT, PROT, ALBUMIN in the last 168 hours. No results for input(s): LIPASE, AMYLASE in the last 168 hours. No results for input(s): AMMONIA in the last 168 hours. Coagulation Profile: Recent Labs  Lab 02/09/24 0139  INR 1.0   Cardiac Enzymes: No results for input(s): CKTOTAL, CKMB, CKMBINDEX, TROPONINI in the last 168 hours. BNP (last 3 results) No results for input(s): PROBNP in the last 8760 hours. HbA1C: No results for input(s): HGBA1C in the last 72 hours. CBG: No results for input(s): GLUCAP in the last 168 hours. Lipid Profile: No results for input(s): CHOL, HDL, LDLCALC, TRIG, CHOLHDL, LDLDIRECT in the last 72 hours. Thyroid Function Tests: No results for input(s): TSH, T4TOTAL, FREET4, T3FREE, THYROIDAB in the last 72 hours. Anemia Panel: No results for input(s): VITAMINB12, FOLATE, FERRITIN, TIBC, IRON, RETICCTPCT in the last 72 hours. Urine analysis:    Component Value Date/Time    COLORURINE YELLOW (A) 01/26/2023 0010   APPEARANCEUR HAZY (A) 01/26/2023 0010   LABSPEC 1.014 01/26/2023 0010   PHURINE 5.0 01/26/2023 0010   GLUCOSEU NEGATIVE 01/26/2023 0010   HGBUR NEGATIVE 01/26/2023 0010   BILIRUBINUR NEGATIVE 01/26/2023 0010   KETONESUR NEGATIVE 01/26/2023 0010   PROTEINUR 100 (A) 01/26/2023 0010   NITRITE NEGATIVE 01/26/2023 0010   LEUKOCYTESUR NEGATIVE 01/26/2023 0010    Radiological Exams on Admission: US  Venous Img Lower Unilateral Left Result Date: 02/09/2024 EXAM: ULTRASOUND DUPLEX OF THE LEFT LOWER EXTREMITY VEINS TECHNIQUE: Duplex ultrasound using B-mode/gray scaled imaging and Doppler spectral analysis and color flow was obtained of the deep venous structures of the left  lower extremity. COMPARISON: None available. CLINICAL HISTORY: Cool/numb, hx of DVT, takes Eliquis . FINDINGS: The common femoral vein, femoral vein, popliteal vein, and posterior tibial vein demonstrate normal compressibility with normal color flow and spectral analysis. Filling defect throughout the greater saphenous vein with noncompressibility compatible with superficial thrombophlebitis. IMPRESSION: 1. No evidence of DVT. 2. Superficial thrombophlebitis of the greater saphenous vein. Electronically signed by: Franky Crease MD MD 02/09/2024 03:34 AM EST RP Workstation: [id number]    EKG: Independently reviewed.  Sinus rhythm  Assessment/Plan Principal Problem:   Critical limb ischemia of left lower extremity (HCC) Active Problems:   Acute lower limb ischemia  Acute left leg ischemia Symptoms started happening a week ago, acutely worsened on the night prior to presentation.  Reports intermittent compliance with Eliquis  or Plavix  and not taking over the last several days.  Vascular evaluated and noted his femorofemoral bypass graft to likely be occluded given lack of palpable pulses on the affected extremity. Plan:  Continue IV heparin  Vascular lab for angiography today  Severe  PAD PVD On heparin  gtt. as above Angiography as above Will need antiplatelet therapy and continued education on the importance of adherence  COPD Not acutely exacerbated continue home bronchodilator regimen  BPH Flomax   Hypertension Coreg   Hyperlipidemia Statin   DVT prophylaxis: IV heparin  Code Status: Full Family Communication: None Disposition Plan: Anticipate return to previous home environment Consults called: Vascular surgery Admission status: Inpatient, telemetry   Calvin KATHEE Robson MD [facility]   If 7PM-7AM, please contact night-coverage   02/09/2024, 8:31 AM       [1]  Allergies Allergen Reactions   Iodine  Rash   Ivp Dye [Iodinated Contrast Media] Hives   Mushroom Other (See Comments)   Latex Hives and Itching   Iodinated Contrast Media Hives and Rash   Other Itching and Rash    Turkey Meat allergy per patient   "

## 2024-02-09 NOTE — Op Note (Signed)
 Christopher Bryan  Percutaneous Study/Intervention Procedural Note   Date of Surgery: 02/09/2024  Surgeon(s):Sharvi Mooneyhan    Assistants:none  Pre-operative Diagnosis: PAD with ulceration LLE, acute on chronic ischemia  Post-operative diagnosis:  Same  Procedure(s) Performed:             1.  Ultrasound guidance for vascular access right superficial femoral artery             2.  Catheter placement into left profunda femoris artery and left superficial femoral artery from right femoral approach through the femoral to femoral bypass             3.  Bilateral iliofemoral angiograms and left lower extremity angiogram             4.  Percutaneous transluminal angioplasty of right common femoral artery with 5 mm diameter by 10 cm length angioplasty balloon             5.  Mechanical thrombectomy using the penumbra CAT 7D catheter to the femoral to femoral bypass, left profunda femoris artery, and left superficial femoral artery  6.  Angioplasty of the left superficial femoral artery with 4 mm diameter Lutonix drug-coated balloon  7.  Angioplasty of the left profunda femoris artery with 4 mm diameter by 10 cm length Lutonix drug-coated balloon  8.  Stent placement to the left profunda femoris artery with 6 mm diameter by 5 cm length Viabahn stent  9.  Stent placement to the distal portion of the right to left femoral-femoral bypass analogous to the left external iliac artery with 8 mm diameter by 7.5 cm length Viabahn stent             10.  Celt closure device right superficial femoral artery  EBL: 450 cc  Contrast: 35 cc  Fluoro Time: 11.9 minutes  Moderate Conscious Sedation Time: approximately 70 minutes using 3 mg of Versed  and 75 mcg of Fentanyl               Indications:  Patient is a 64 y.o.male with advanced atherosclerotic occlusive disease with multiple previous interventions and surgeries including a femoral to femoral bypass which had to be treated with thrombectomy  about 4 months ago.  He stopped taking his medications and about a week ago began having left leg pain which crescendoed to disabling rest pain last night causing him to present to the emergency room.  The patient is brought in for angiography for further evaluation and potential treatment.  Due to the limb threatening nature of the situation, angiogram was performed for attempted limb salvage. The patient is aware that if the procedure fails, amputation would be expected.  The patient also understands that even with successful revascularization, amputation may still be required due to the severity of the situation.  Risks and benefits are discussed and informed consent is obtained.   Procedure:  The patient was identified and appropriate procedural time out was performed.  The patient was then placed supine on the table and prepped and draped in the usual sterile fashion. Moderate conscious sedation was administered during a face to face encounter with the patient throughout the procedure with my supervision of the RN administering medicines and monitoring the patient's vital signs, pulse oximetry, telemetry and mental status throughout from the start of the procedure until the patient was taken to the recovery room. Ultrasound was used to evaluate the right superficial femoral artery.  It was patent .  A digital ultrasound image  was acquired.  A micropuncture needle was used to access the right superficial femoral artery under direct ultrasound guidance and a permanent image was performed.  A micropuncture wire and sheath were then placed.  A 0.035 J wire was advanced without resistance and a 5Fr sheath was placed.  Imaging was then performed through the 5 French sheath which showed moderate stenosis in the right common femoral artery at the area of the anastomosis to the femoral-femoral bypass in the 60% range.  The right external and common iliac arteries are widely patent.  The femoral-femoral bypass was  thrombosed.  It was felt that it was in the patient's best interest to proceed with intervention after these images to avoid a second procedure and a larger amount of contrast and fluoroscopy based off of the findings from the initial angiogram. The patient was systemically heparinized and a 7 French slender sheath was then placed over the Terumo Advantage wire. I then used a Kumpe catheter and the advantage wire to cross the occlusion and initially get down into the profunda femoris artery which was patent beyond the proximal occlusion.  I then exchanged for a V18 wire and used the penumbra CAT 7D catheter and perform mechanical thrombectomy with multiple passes throughout the bypass graft and the profunda femoris artery.  Following 3 passes with the penumbra CAT 7D catheter the bypass graft no high flow, but there is still some thrombus at the distal portion of the anastomosis and at both the origins of the SFA and profunda femoris artery.  I then redirected the V18 wire down the SFA and performed mechanical thrombectomy with a CAT 7D catheter down to the mid SFA.  There still remained a large chunk of thrombus markedly limiting flow in both vessels and so I elected to perform angioplasty first in the SFA using a 4 mm diameter by 22 cm length Lutonix drug-coated angioplasty balloon.  I then redirected the wire down to the profunda femoris artery and used a 4 mm diameter by 10 cm length Lutonix drug-coated angioplasty balloon.  Following this, the SFA was occluded in the midsegment distally and I felt that our only hope keeping patency for possible limb salvage or at least healing an amputation would be to get the profunda femoris artery open.  I then upsized to a 5 mm diameter by 10 cm length Lutonix drug-coated angioplasty balloon for the profunda femoris artery.  Following this, there is still stenosis or thrombus in the proximal profunda femoris artery as well as the distal portion of the bypass graft graft  which was a femoral to femoral bypass analogous to the left external iliac artery.  The only way to fix this at this point I felt would likely be too place covered stents into the profunda femoris artery as well as into the bypass.  A 6 mm diameter by 5 cm length Viabahn stent was selected and deployed in the left profunda femoris artery and then an 8 mm diameter by 7.5 cm length Viabahn stent was deployed in the femoral-femoral bypass analogous to the left external iliac artery.  Postdilated with a distal Viabahn with a 5 mm balloon.  There was excellent angiographic completion result and less than 10% residual stenosis and no thrombus remaining in the lumen after stent placement.  The 5 mm diameter by 10 cm length balloon was then used to perform angioplasty on the right common femoral artery to ensure inflow was adequate proximally.  After angioplasty there was only about a  20% residual stenosis in this location which was markedly improved.  I elected to terminate the procedure. The sheath was removed and Celt closure device was deployed in the right superficial femoral artery with excellent hemostatic result. The patient was taken to the recovery room in stable condition having tolerated the procedure well.  Findings:               Aortogram: Right iliac arteries were patent.  Right common femoral artery had about a 60% stenosis.  The femoral to femoral bypass was thrombosed.  There was occlusion of the proximal portions of both the left profunda femoris and superficial femoral artery and the superficial femoral artery appeared to be occluded distally.                Disposition: Patient was taken to the recovery room in stable condition having tolerated the procedure well.  Complications: None  Christopher Bryan 02/09/2024 12:41 PM   This note was created with Dragon Medical transcription system. Any errors in dictation are purely unintentional.

## 2024-02-09 NOTE — H&P (View-Only) (Signed)
 "                                   [facility]  Patient name: Christopher Bryan. MRN: [id number] DOB: 1960-09-26 Sex: male   REQUESTING PROVIDER:    ER   REASON FOR CONSULT:    Left leg pain  HISTORY OF PRESENT ILLNESS:   Christopher Bryan. is a 64 y.o. male, who presented to the emergency department with a 1 week history of left leg pain which got acutely worse last night at 6 PM.  He is able to move his toes but the foot feels numb.  The patient has been on Eliquis  and Plavix  however he admits to missing several doses.  He had a similar episode in September 2025 and underwent angiography.  His femoral-femoral bypass graft was occluded.  This was treated with tPA and mechanical thrombectomy followed by drug-coated balloon angioplasty.  The patient has a history of A-fib and DVT for which she takes Eliquis .  He is a current smoker with COPD.  He takes a statin for hypercholesterolemia.  He has undergone the following procedures at [facility]: 01/21/2020 stent, right external iliac artery, angioplasty right common femoral artery, mechanical thrombectomy of femoral-femoral bypass 09/08/2021: Angioplasty femoral-femoral bypass, stent femoral-femoral bypass 06/20/2022 mechanical thrombectomy femoral-femoral bypass with [facility] and angioplasty and stenting 10/20/2023: Thrombectomy of femoral-femoral bypass graft with DCB History of multiple lower extremity bypass grafts at the [facility] PAST MEDICAL HISTORY    Past Medical History:  Diagnosis Date   A-fib (HCC)    CHF (congestive heart failure) (HCC)    COPD (chronic obstructive pulmonary disease) (HCC)    DVT (deep venous thrombosis) (HCC)    Hypertension    Peripheral arterial disease    Right-sided heart failure (HCC)    Smoker      FAMILY HISTORY   Family History  Problem Relation Age of Onset   CAD Mother    Emphysema Father     SOCIAL HISTORY:   Social History   Socioeconomic History   Marital  status: Single    Spouse name: Not on file   Number of children: 2   Years of education: Not on file   Highest education level: 10th grade  Occupational History   Occupation: Retired  Tobacco Use   Smoking status: Every Day    Current packs/day: 0.50    Average packs/day: 0.5 packs/day for 40.0 years (20.0 ttl pk-yrs)    Types: Cigarettes   Smokeless tobacco: Never  Vaping Use   Vaping status: Former  Substance and Sexual Activity   Alcohol  use: Not Currently    Comment: 0   Drug use: Yes    Frequency: 1.0 times per week    Types: Crack cocaine, Marijuana   Sexual activity: Not Currently  Other Topics Concern   Not on file  Social History Narrative   ** Merged History Encounter **       Social Drivers of Health   Tobacco Use: High Risk (02/09/2024)   Patient History    Smoking Tobacco Use: Every Day    Smokeless Tobacco Use: Never    Passive Exposure: Not on file  Financial Resource Strain: Low Risk (03/29/2023)   Overall Financial Resource Strain (CARDIA)    Difficulty of Paying Living Expenses: Not hard at all  Food Insecurity: No Food Insecurity (10/22/2023)   Epic  Worried About Programme Researcher, Broadcasting/film/video in the Last Year: Never true    Ran Out of Food in the Last Year: Never true  Transportation Needs: No Transportation Needs (10/22/2023)   Epic    Lack of Transportation (Medical): No    Lack of Transportation (Non-Medical): No  Physical Activity: Not on file  Stress: Not on file  Social Connections: Unknown (10/22/2023)   Social Connection and Isolation Panel    Frequency of Communication with Friends and Family: Twice a week    Frequency of Social Gatherings with Friends and Family: Once a week    Attends Religious Services: 1 to 4 times per year    Active Member of [employer] or Organizations: No    Attends Banker Meetings: Never    Marital Status: Patient declined  Catering Manager Violence: Not At Risk (10/22/2023)   Epic    Fear of Current or  Ex-Partner: No    Emotionally Abused: No    Physically Abused: No    Sexually Abused: No  Depression (PHQ2-9): Not on file  Alcohol  Screen: Not on file  Housing: Low Risk (10/22/2023)   Epic    Unable to Pay for Housing in the Last Year: No    Number of Times Moved in the Last Year: 0    Homeless in the Last Year: No  Utilities: Not At Risk (10/22/2023)   Epic    Threatened with loss of utilities: No  Health Literacy: Not on file    ALLERGIES:    Allergies[1]  CURRENT MEDICATIONS:    Current Facility-Administered Medications  Medication Dose Route Frequency Provider Last Rate Last Admin   heparin  ADULT infusion 100 units/mL (25000 units/250mL)  850 Units/hr Intravenous Continuous Dail Rankin RAMAN, RPH 8.5 mL/hr at 02/09/24 0509 850 Units/hr at 02/09/24 0509   methylPREDNISolone  sodium succinate  (SOLU-MEDROL ) 40 mg/mL injection 40 mg  40 mg Intravenous Q4H Gordan Huxley, MD   40 mg at 02/09/24 9480   Current Outpatient Medications  Medication Sig Dispense Refill   acetaminophen  (TYLENOL ) 325 MG tablet Take 650 mg by mouth every 6 (six) hours as needed.     albuterol  (PROVENTIL  HFA;VENTOLIN  HFA) 108 (90 Base) MCG/ACT inhaler Inhale 2 puffs into the lungs every 6 (six) hours as needed for wheezing or shortness of breath.     apixaban  (ELIQUIS ) 5 MG TABS tablet Take 1 tablet (5 mg total) by mouth 2 (two) times daily. 30 tablet 3   atorvastatin  (LIPITOR ) 40 MG tablet Take 20 mg by mouth at bedtime.     carboxymethylcellulose (REFRESH PLUS) 0.5 % SOLN Place 1 drop into both eyes 4 (four) times daily as needed. 15 mL 0   carvedilol  (COREG ) 12.5 MG tablet Take 1 tablet (12.5 mg total) by mouth 2 (two) times daily. 180 tablet 3   cholecalciferol  (VITAMIN D ) 1000 units tablet Take 2,000 Units by mouth daily.     clopidogrel  (PLAVIX ) 75 MG tablet Take 1 tablet (75 mg total) by mouth daily. 30 tablet 0   diclofenac  Sodium (VOLTAREN ) 1 % GEL Apply 2 g topically 3 (three) times daily as  needed.     empagliflozin  (JARDIANCE ) 10 MG TABS tablet Take 1 tablet (10 mg total) by mouth daily before breakfast. 30 tablet 0   fluticasone  (FLONASE ) 50 MCG/ACT nasal spray Place 1 spray into both nostrils daily as needed.     fluticasone -salmeterol (ADVAIR) 250-50 MCG/ACT AEPB Inhale 1 puff into the lungs in the morning and at bedtime.  furosemide  (LASIX ) 20 MG tablet Take 1 tablet (20 mg total) by mouth daily as needed for edema (weight gain of 3 lbs in a day or 5 lbs in a week). 30 tablet 0   gabapentin  (NEURONTIN ) 300 MG capsule Take 600 mg by mouth 3 (three) times daily. TAKE TWO CAPSULES BY MOUTH THREE TIMES A DAY *START WITH 1 CAPSULE THREE TIMES A DAY AND INCREASE BY 1 CAPSULE EVERY 3 DAYS UNTIL TAKING TWO CAPSULES THREE TIMES A DAY     lidocaine  (LIDODERM ) 5 % Place 1 patch onto the skin daily. Remove & Discard patch within 12 hours or as directed by MD     loratadine  (CLARITIN ) 10 MG tablet Take 10 mg by mouth daily as needed.     melatonin 3 MG TABS tablet Take 3 mg by mouth at bedtime.     methocarbamol  (ROBAXIN ) 500 MG tablet Take 500 mg by mouth every 8 (eight) hours as needed for muscle spasms.     mirtazapine  (REMERON ) 7.5 MG tablet Take 7.5 mg by mouth at bedtime.     nicotine  polacrilex (COMMIT) 4 MG lozenge Take 4 mg by mouth as needed. DISSOLVE 1 LOZENGE BY MOUTH AS DIRECTED FOR SMOKING CESSATION USE AT LEAST 8-10 LOZENGES EACH DAY TO START. DO NOT USE MORE THAN 20 LOZENGES IN A DAY. DO NOT EAT OR DRINK FOR 15 MINUTES BEFORE AND DURING USE (Patient not taking: Reported on 10/20/2023)     pantoprazole  (PROTONIX ) 20 MG tablet Take 20 mg by mouth daily.     polyethylene glycol powder (GLYCOLAX /MIRALAX ) 17 GM/SCOOP powder Take 17 g by mouth daily as needed for moderate constipation. 510 g 1   sacubitril -valsartan  (ENTRESTO ) 24-26 MG Take 1 tablet by mouth 2 (two) times daily. 180 tablet 3   sennosides-docusate sodium  (SENOKOT-S) 8.6-50 MG tablet Take 1-2 tablets by mouth at  bedtime as needed for constipation.     sildenafil  (VIAGRA ) 100 MG tablet Take 100 mg by mouth daily as needed for erectile dysfunction.     spironolactone  (ALDACTONE ) 25 MG tablet Take 0.5 tablets (12.5 mg total) by mouth daily. 30 tablet 0   tamsulosin  (FLOMAX ) 0.4 MG CAPS capsule Take 0.4 mg by mouth daily after supper.     tiotropium (SPIRIVA ) 18 MCG inhalation capsule Place 18 mcg into inhaler and inhale daily.      REVIEW OF SYSTEMS:   [X]  denotes positive finding, [ ]  denotes negative finding Cardiac  Comments:  Chest pain or chest pressure:    Shortness of breath upon exertion:    Short of breath when lying flat:    Irregular heart rhythm:        Vascular    Pain in calf, thigh, or hip brought on by ambulation: x   Pain in feet at night that wakes you up from your sleep:  x   Blood clot in your veins:    Leg swelling:         Pulmonary    Oxygen at home:    Productive cough:     Wheezing:         Neurologic    Sudden weakness in arms or legs:     Sudden numbness in arms or legs:     Sudden onset of difficulty speaking or slurred speech:    Temporary loss of vision in one eye:     Problems with dizziness:         Gastrointestinal    Blood in stool:  Vomited blood:         Genitourinary    Burning when urinating:     Blood in urine:        Psychiatric    Major depression:         Hematologic    Bleeding problems:    Problems with blood clotting too easily:        Skin    Rashes or ulcers:        Constitutional    Fever or chills:     PHYSICAL EXAM:   Vitals:   02/09/24 0138 02/09/24 0139 02/09/24 0540  BP:  122/69 (!) 104/57  Pulse:  100 69  Resp:  18 12  Temp:  98.7 F (37.1 C) (!) 97.4 F (36.3 C)  SpO2:  95% 91%  Weight: 54.4 kg    Height: 5' 8 (1.727 m)      GENERAL: The patient is a well-nourished male, in no acute distress. The vital signs are documented above. CARDIAC: There is a regular rate and rhythm.  VASCULAR: Palpable  right femoral pulse and Doppler signal in the right dorsalis pedis.  Nonpalpable left femoral pulse.  No palpable pulse within femoral-femoral graft I am unable to get a left popliteal or left pedal Doppler signal PULMONARY: Nonlabored respirations ABDOMEN: Soft and non-tender with normal pitched bowel sounds.  MUSCULOSKELETAL: There are no major deformities or cyanosis. NEUROLOGIC: Motor function is slightly diminished in the left foot but present.  Sensation is also present SKIN: There are no ulcers or rashes noted. PSYCHIATRIC: The patient has a normal affect.  STUDIES:     ASSESSMENT and PLAN   Acute left leg ischemia: The patient began having symptoms a week ago but got worse last night prompting him to come to the emergency department.  He reports not taking his Eliquis  or Plavix  for several days.  On exam his femoral-femoral bypass graft appears to be occluded.  He has been started on IV heparin .  I discussed that he will need to go to the Cath Lab for attempt at thrombectomy, possibly even open surgical repair.  He should remain n.p.o.  This is a limb threatening situation.  The patient's baseline creatinine is around 1.  It was 2 on arrival.  He has received a liter bolus of fluid and getting maintenance fluids.   Malvina New, IV, MD, FACS [facility] [phone] Pager [phone]     [1]  Allergies Allergen Reactions   Iodine  Rash   Ivp Dye [Iodinated Contrast Media] Hives   Mushroom Other (See Comments)   Latex Hives and Itching   Iodinated Contrast Media Hives and Rash   Other Itching and Rash    Turkey Meat allergy per patient   "

## 2024-02-09 NOTE — Interval H&P Note (Signed)
 History and Physical Interval Note:  02/09/2024 10:32 AM  Christopher Bryan.  has presented today for surgery, with the diagnosis of Left leg ischemia.  The various methods of treatment have been discussed with the patient and family. After consideration of risks, benefits and other options for treatment, the patient has consented to  Procedures: ABDOMINAL AORTOGRAM W/LOWER EXTREMITY (N/A) as a surgical intervention.  The patient's history has been reviewed, patient examined, no change in status, stable for surgery.  I have reviewed the patient's chart and labs.  Questions were answered to the patient's satisfaction.     [name]

## 2024-02-09 NOTE — Consult Note (Signed)
 "                                   [facility]  Patient name: Christopher Bryan. MRN: [id number] DOB: 1960-09-26 Sex: male   REQUESTING PROVIDER:    ER   REASON FOR CONSULT:    Left leg pain  HISTORY OF PRESENT ILLNESS:   Christopher Bryan. is a 64 y.o. male, who presented to the emergency department with a 1 week history of left leg pain which got acutely worse last night at 6 PM.  He is able to move his toes but the foot feels numb.  The patient has been on Eliquis  and Plavix  however he admits to missing several doses.  He had a similar episode in September 2025 and underwent angiography.  His femoral-femoral bypass graft was occluded.  This was treated with tPA and mechanical thrombectomy followed by drug-coated balloon angioplasty.  The patient has a history of A-fib and DVT for which she takes Eliquis .  He is a current smoker with COPD.  He takes a statin for hypercholesterolemia.  He has undergone the following procedures at [facility]: 01/21/2020 stent, right external iliac artery, angioplasty right common femoral artery, mechanical thrombectomy of femoral-femoral bypass 09/08/2021: Angioplasty femoral-femoral bypass, stent femoral-femoral bypass 06/20/2022 mechanical thrombectomy femoral-femoral bypass with [facility] and angioplasty and stenting 10/20/2023: Thrombectomy of femoral-femoral bypass graft with DCB History of multiple lower extremity bypass grafts at the [facility] PAST MEDICAL HISTORY    Past Medical History:  Diagnosis Date   A-fib (HCC)    CHF (congestive heart failure) (HCC)    COPD (chronic obstructive pulmonary disease) (HCC)    DVT (deep venous thrombosis) (HCC)    Hypertension    Peripheral arterial disease    Right-sided heart failure (HCC)    Smoker      FAMILY HISTORY   Family History  Problem Relation Age of Onset   CAD Mother    Emphysema Father     SOCIAL HISTORY:   Social History   Socioeconomic History   Marital  status: Single    Spouse name: Not on file   Number of children: 2   Years of education: Not on file   Highest education level: 10th grade  Occupational History   Occupation: Retired  Tobacco Use   Smoking status: Every Day    Current packs/day: 0.50    Average packs/day: 0.5 packs/day for 40.0 years (20.0 ttl pk-yrs)    Types: Cigarettes   Smokeless tobacco: Never  Vaping Use   Vaping status: Former  Substance and Sexual Activity   Alcohol  use: Not Currently    Comment: 0   Drug use: Yes    Frequency: 1.0 times per week    Types: Crack cocaine, Marijuana   Sexual activity: Not Currently  Other Topics Concern   Not on file  Social History Narrative   ** Merged History Encounter **       Social Drivers of Health   Tobacco Use: High Risk (02/09/2024)   Patient History    Smoking Tobacco Use: Every Day    Smokeless Tobacco Use: Never    Passive Exposure: Not on file  Financial Resource Strain: Low Risk (03/29/2023)   Overall Financial Resource Strain (CARDIA)    Difficulty of Paying Living Expenses: Not hard at all  Food Insecurity: No Food Insecurity (10/22/2023)   Epic  Worried About Programme Researcher, Broadcasting/film/video in the Last Year: Never true    Ran Out of Food in the Last Year: Never true  Transportation Needs: No Transportation Needs (10/22/2023)   Epic    Lack of Transportation (Medical): No    Lack of Transportation (Non-Medical): No  Physical Activity: Not on file  Stress: Not on file  Social Connections: Unknown (10/22/2023)   Social Connection and Isolation Panel    Frequency of Communication with Friends and Family: Twice a week    Frequency of Social Gatherings with Friends and Family: Once a week    Attends Religious Services: 1 to 4 times per year    Active Member of [employer] or Organizations: No    Attends Banker Meetings: Never    Marital Status: Patient declined  Catering Manager Violence: Not At Risk (10/22/2023)   Epic    Fear of Current or  Ex-Partner: No    Emotionally Abused: No    Physically Abused: No    Sexually Abused: No  Depression (PHQ2-9): Not on file  Alcohol  Screen: Not on file  Housing: Low Risk (10/22/2023)   Epic    Unable to Pay for Housing in the Last Year: No    Number of Times Moved in the Last Year: 0    Homeless in the Last Year: No  Utilities: Not At Risk (10/22/2023)   Epic    Threatened with loss of utilities: No  Health Literacy: Not on file    ALLERGIES:    Allergies[1]  CURRENT MEDICATIONS:    Current Facility-Administered Medications  Medication Dose Route Frequency Provider Last Rate Last Admin   heparin  ADULT infusion 100 units/mL (25000 units/250mL)  850 Units/hr Intravenous Continuous Dail Rankin RAMAN, RPH 8.5 mL/hr at 02/09/24 0509 850 Units/hr at 02/09/24 0509   methylPREDNISolone  sodium succinate  (SOLU-MEDROL ) 40 mg/mL injection 40 mg  40 mg Intravenous Q4H Gordan Huxley, MD   40 mg at 02/09/24 9480   Current Outpatient Medications  Medication Sig Dispense Refill   acetaminophen  (TYLENOL ) 325 MG tablet Take 650 mg by mouth every 6 (six) hours as needed.     albuterol  (PROVENTIL  HFA;VENTOLIN  HFA) 108 (90 Base) MCG/ACT inhaler Inhale 2 puffs into the lungs every 6 (six) hours as needed for wheezing or shortness of breath.     apixaban  (ELIQUIS ) 5 MG TABS tablet Take 1 tablet (5 mg total) by mouth 2 (two) times daily. 30 tablet 3   atorvastatin  (LIPITOR ) 40 MG tablet Take 20 mg by mouth at bedtime.     carboxymethylcellulose (REFRESH PLUS) 0.5 % SOLN Place 1 drop into both eyes 4 (four) times daily as needed. 15 mL 0   carvedilol  (COREG ) 12.5 MG tablet Take 1 tablet (12.5 mg total) by mouth 2 (two) times daily. 180 tablet 3   cholecalciferol  (VITAMIN D ) 1000 units tablet Take 2,000 Units by mouth daily.     clopidogrel  (PLAVIX ) 75 MG tablet Take 1 tablet (75 mg total) by mouth daily. 30 tablet 0   diclofenac  Sodium (VOLTAREN ) 1 % GEL Apply 2 g topically 3 (three) times daily as  needed.     empagliflozin  (JARDIANCE ) 10 MG TABS tablet Take 1 tablet (10 mg total) by mouth daily before breakfast. 30 tablet 0   fluticasone  (FLONASE ) 50 MCG/ACT nasal spray Place 1 spray into both nostrils daily as needed.     fluticasone -salmeterol (ADVAIR) 250-50 MCG/ACT AEPB Inhale 1 puff into the lungs in the morning and at bedtime.  furosemide  (LASIX ) 20 MG tablet Take 1 tablet (20 mg total) by mouth daily as needed for edema (weight gain of 3 lbs in a day or 5 lbs in a week). 30 tablet 0   gabapentin  (NEURONTIN ) 300 MG capsule Take 600 mg by mouth 3 (three) times daily. TAKE TWO CAPSULES BY MOUTH THREE TIMES A DAY *START WITH 1 CAPSULE THREE TIMES A DAY AND INCREASE BY 1 CAPSULE EVERY 3 DAYS UNTIL TAKING TWO CAPSULES THREE TIMES A DAY     lidocaine  (LIDODERM ) 5 % Place 1 patch onto the skin daily. Remove & Discard patch within 12 hours or as directed by MD     loratadine  (CLARITIN ) 10 MG tablet Take 10 mg by mouth daily as needed.     melatonin 3 MG TABS tablet Take 3 mg by mouth at bedtime.     methocarbamol  (ROBAXIN ) 500 MG tablet Take 500 mg by mouth every 8 (eight) hours as needed for muscle spasms.     mirtazapine  (REMERON ) 7.5 MG tablet Take 7.5 mg by mouth at bedtime.     nicotine  polacrilex (COMMIT) 4 MG lozenge Take 4 mg by mouth as needed. DISSOLVE 1 LOZENGE BY MOUTH AS DIRECTED FOR SMOKING CESSATION USE AT LEAST 8-10 LOZENGES EACH DAY TO START. DO NOT USE MORE THAN 20 LOZENGES IN A DAY. DO NOT EAT OR DRINK FOR 15 MINUTES BEFORE AND DURING USE (Patient not taking: Reported on 10/20/2023)     pantoprazole  (PROTONIX ) 20 MG tablet Take 20 mg by mouth daily.     polyethylene glycol powder (GLYCOLAX /MIRALAX ) 17 GM/SCOOP powder Take 17 g by mouth daily as needed for moderate constipation. 510 g 1   sacubitril -valsartan  (ENTRESTO ) 24-26 MG Take 1 tablet by mouth 2 (two) times daily. 180 tablet 3   sennosides-docusate sodium  (SENOKOT-S) 8.6-50 MG tablet Take 1-2 tablets by mouth at  bedtime as needed for constipation.     sildenafil  (VIAGRA ) 100 MG tablet Take 100 mg by mouth daily as needed for erectile dysfunction.     spironolactone  (ALDACTONE ) 25 MG tablet Take 0.5 tablets (12.5 mg total) by mouth daily. 30 tablet 0   tamsulosin  (FLOMAX ) 0.4 MG CAPS capsule Take 0.4 mg by mouth daily after supper.     tiotropium (SPIRIVA ) 18 MCG inhalation capsule Place 18 mcg into inhaler and inhale daily.      REVIEW OF SYSTEMS:   [X]  denotes positive finding, [ ]  denotes negative finding Cardiac  Comments:  Chest pain or chest pressure:    Shortness of breath upon exertion:    Short of breath when lying flat:    Irregular heart rhythm:        Vascular    Pain in calf, thigh, or hip brought on by ambulation: x   Pain in feet at night that wakes you up from your sleep:  x   Blood clot in your veins:    Leg swelling:         Pulmonary    Oxygen at home:    Productive cough:     Wheezing:         Neurologic    Sudden weakness in arms or legs:     Sudden numbness in arms or legs:     Sudden onset of difficulty speaking or slurred speech:    Temporary loss of vision in one eye:     Problems with dizziness:         Gastrointestinal    Blood in stool:  Vomited blood:         Genitourinary    Burning when urinating:     Blood in urine:        Psychiatric    Major depression:         Hematologic    Bleeding problems:    Problems with blood clotting too easily:        Skin    Rashes or ulcers:        Constitutional    Fever or chills:     PHYSICAL EXAM:   Vitals:   02/09/24 0138 02/09/24 0139 02/09/24 0540  BP:  122/69 (!) 104/57  Pulse:  100 69  Resp:  18 12  Temp:  98.7 F (37.1 C) (!) 97.4 F (36.3 C)  SpO2:  95% 91%  Weight: 54.4 kg    Height: 5' 8 (1.727 m)      GENERAL: The patient is a well-nourished male, in no acute distress. The vital signs are documented above. CARDIAC: There is a regular rate and rhythm.  VASCULAR: Palpable  right femoral pulse and Doppler signal in the right dorsalis pedis.  Nonpalpable left femoral pulse.  No palpable pulse within femoral-femoral graft I am unable to get a left popliteal or left pedal Doppler signal PULMONARY: Nonlabored respirations ABDOMEN: Soft and non-tender with normal pitched bowel sounds.  MUSCULOSKELETAL: There are no major deformities or cyanosis. NEUROLOGIC: Motor function is slightly diminished in the left foot but present.  Sensation is also present SKIN: There are no ulcers or rashes noted. PSYCHIATRIC: The patient has a normal affect.  STUDIES:     ASSESSMENT and PLAN   Acute left leg ischemia: The patient began having symptoms a week ago but got worse last night prompting him to come to the emergency department.  He reports not taking his Eliquis  or Plavix  for several days.  On exam his femoral-femoral bypass graft appears to be occluded.  He has been started on IV heparin .  I discussed that he will need to go to the Cath Lab for attempt at thrombectomy, possibly even open surgical repair.  He should remain n.p.o.  This is a limb threatening situation.  The patient's baseline creatinine is around 1.  It was 2 on arrival.  He has received a liter bolus of fluid and getting maintenance fluids.   Malvina New, IV, MD, FACS [facility] [phone] Pager [phone]     [1]  Allergies Allergen Reactions   Iodine  Rash   Ivp Dye [Iodinated Contrast Media] Hives   Mushroom Other (See Comments)   Latex Hives and Itching   Iodinated Contrast Media Hives and Rash   Other Itching and Rash    Turkey Meat allergy per patient   "

## 2024-02-09 NOTE — Progress Notes (Signed)
 ANTICOAGULATION CONSULT NOTE  Pharmacy Consult for heparin  infusion Indication: Right lower leg ischemia, hx of the same, takes Eliquis  but is not always compliant with regimen.  Allergies[1]  Patient Measurements: Height: 5' 8 (172.7 cm) Weight: 54.4 kg (120 lb) IBW/kg (Calculated) : 68.4 HEPARIN  DW (KG): 54.4  Vital Signs: Temp: 98.7 F (37.1 C) (01/09 0139) BP: 122/69 (01/09 0139) Pulse Rate: 100 (01/09 0139)  Labs: Recent Labs    02/09/24 0139  HGB 13.4  HCT 41.8  PLT 197  LABPROT 14.0  INR 1.0  CREATININE 2.05*    Estimated Creatinine Clearance: 27.6 mL/min (A) (by C-G formula based on SCr of 2.05 mg/dL (H)).   Medical History: Past Medical History:  Diagnosis Date   A-fib (HCC)    CHF (congestive heart failure) (HCC)    COPD (chronic obstructive pulmonary disease) (HCC)    DVT (deep venous thrombosis) (HCC)    Hypertension    Peripheral arterial disease    Right-sided heart failure (HCC)    Smoker     Medications:  PTA Meds: Eliquis  5 mg BID, non-compliant  Assessment: Pt is a 64 yo male presenting to ED c/o left foot numbness and h/o DVT noncompliant with medications  Goal of Therapy:  Heparin  level 0.3-0.7 units/ml aPTT 66-102 seconds Monitor platelets by anticoagulation protocol: Yes   Plan:  Bolus 3300 units x 1 Start heparin  infusion at 850 units/hr Will follow aPTT until correlation w/ HL confirmed Will check aPTT in 8 hr after start of infusion HL & CBC daily while on heparin   Rankin CANDIE Dills, PharmD, [facility] 02/09/2024 4:40 AM      [1]  Allergies Allergen Reactions   Iodine  Rash   Ivp Dye [Iodinated Contrast Media] Hives   Mushroom Other (See Comments)   Latex Hives and Itching   Iodinated Contrast Media Hives and Rash   Other Itching and Rash    Turkey Meat allergy per patient

## 2024-02-09 NOTE — ED Triage Notes (Signed)
 Pt to ED via EMS from home, pt reports hx blood clots and 1 week ago pt noticed some numbness in his left foot/leg and pt reports it feels cool to the touch. Pt takes Eliquis  daily.

## 2024-02-10 LAB — BASIC METABOLIC PANEL WITH GFR
Anion gap: 7 (ref 5–15)
BUN: 45 mg/dL — ABNORMAL HIGH (ref 8–23)
CO2: 24 mmol/L (ref 22–32)
Calcium: 8.4 mg/dL — ABNORMAL LOW (ref 8.9–10.3)
Chloride: 107 mmol/L (ref 98–111)
Creatinine, Ser: 1.36 mg/dL — ABNORMAL HIGH (ref 0.61–1.24)
GFR, Estimated: 58 mL/min — ABNORMAL LOW
Glucose, Bld: 105 mg/dL — ABNORMAL HIGH (ref 70–99)
Potassium: 4.2 mmol/L (ref 3.5–5.1)
Sodium: 137 mmol/L (ref 135–145)

## 2024-02-10 LAB — CBC
HCT: 32.2 % — ABNORMAL LOW (ref 39.0–52.0)
Hemoglobin: 11.2 g/dL — ABNORMAL LOW (ref 13.0–17.0)
MCH: 31.7 pg (ref 26.0–34.0)
MCHC: 34.8 g/dL (ref 30.0–36.0)
MCV: 91.2 fL (ref 80.0–100.0)
Platelets: 160 K/uL (ref 150–400)
RBC: 3.53 MIL/uL — ABNORMAL LOW (ref 4.22–5.81)
RDW: 12.8 % (ref 11.5–15.5)
WBC: 5.1 K/uL (ref 4.0–10.5)
nRBC: 0 % (ref 0.0–0.2)

## 2024-02-10 LAB — HEPARIN LEVEL (UNFRACTIONATED): Heparin Unfractionated: 0.57 [IU]/mL (ref 0.30–0.70)

## 2024-02-10 LAB — APTT: aPTT: 75 s — ABNORMAL HIGH (ref 24–36)

## 2024-02-10 MED ORDER — APIXABAN 5 MG PO TABS
5.0000 mg | ORAL_TABLET | Freq: Two times a day (BID) | ORAL | Status: DC
  Administered 2024-02-10 – 2024-02-12 (×5): 5 mg via ORAL
  Filled 2024-02-10 (×5): qty 1

## 2024-02-10 NOTE — Progress Notes (Signed)
 ANTICOAGULATION CONSULT NOTE  Pharmacy Consult for resume apixaban  Indication: Right lower leg ischemia, hx of the same, takes Eliquis  but is not always compliant with regimen.  Allergies[1]  Patient Measurements: Height: 5' 8 (172.7 cm) Weight: 54.4 kg (120 lb) IBW/kg (Calculated) : 68.4 HEPARIN  DW (KG): 54.4  Vital Signs: Temp: 97.9 F (36.6 C) (01/10 1000) Temp Source: Oral (01/10 1000) BP: 111/56 (01/10 1000) Pulse Rate: 71 (01/10 1000)  Labs: Recent Labs    02/09/24 0139 02/09/24 0452 02/09/24 2124 02/10/24 0635 02/10/24 0640  HGB 13.4  --   --   --  11.2*  HCT 41.8  --   --   --  32.2*  PLT 197  --   --   --  160  APTT  --  32 82*  --  75*  LABPROT 14.0  --   --   --   --   INR 1.0  --   --   --   --   HEPARINUNFRC  --  0.92*  --  0.57  --   CREATININE 2.05*  --   --   --  1.36*    Estimated Creatinine Clearance: 41.7 mL/min (A) (by C-G formula based on SCr of 1.36 mg/dL (H)).   Medical History: Past Medical History:  Diagnosis Date   A-fib (HCC)    CHF (congestive heart failure) (HCC)    COPD (chronic obstructive pulmonary disease) (HCC)    DVT (deep venous thrombosis) (HCC)    Hypertension    Peripheral arterial disease    Right-sided heart failure (HCC)    Smoker     Medications:  PTA Meds: Eliquis  5 mg BID, non-compliant  Assessment: Pt is a 64 yo male presenting to ED c/o left foot numbness and h/o DVT noncompliant with medications/. Also w/ hx of Afib On Heparin  drip, to resume apixaban  per consult  Goal of Therapy:  Monitor platelets by anticoagulation protocol: Yes   Plan:  Will order apixaban  5 mg po BID Follow CBC/Scr per protocol  Allean Haas PharmD Clinical Pharmacist 02/10/2024           [1]  Allergies Allergen Reactions   Iodine  Rash   Ivp Dye [Iodinated Contrast Media] Hives   Mushroom Other (See Comments)   Latex Hives and Itching   Iodinated Contrast Media Hives and Rash   Other Itching and Rash     Turkey Meat allergy per patient

## 2024-02-10 NOTE — Evaluation (Signed)
 Occupational Therapy Evaluation Patient Details Name: Christopher Bryan. MRN: [id number] DOB: [birth year] Today's Date: 02/10/2024   History of Present Illness   Christopher Bryan. is a 64 y.o. male with medical history significant of PAD, PVD status post multiple prior vascular inventions, DVT, chronic anticoagulation who presents for intermittent claudication symptoms that have markedly increased in severity in the left leg over the past 8 hours prior to admission.  Patient also endorsed coolness and numbness of left lower extremity.  Constant aching pain.  He has been prescribed Eliquis  and Plavix  however he admits to missing several doses.  Patient had a similar episode in September 2025 and underwent angiography which revealed a occluded femoral-femoral bypass graft subsequently treated with tPA and mechanical thrombectomy.     Patient also current smoker and history of COPD.  On presentation vascular was urgently consulted who recommended heparin  gtt. and Cath Lab for angiography and thrombectomy potentially progressing to open surgical repair and limb saving effort.     Clinical Impressions Pt toileting in bathroom upon OT arrival, and verbalizing dyspnea.  02 sats noted to be 89% on room air, increasing to 90% with guided PLB.  RN notified and brought pt's inhaler.  Pt reports that his MD recently ordered him a rollator.  OT provided educ on benefits of rollator during ADLs for item transport and seated rest breaks, reinforcing use for purposes of EC to maximize indep with daily tasks.  Pt verbalized understanding.  Pt modified indep with BADLs at baseline, with family assisting intermittently with IADLs.  Pt endorses worsening physical decline over the last year, and he attributes some of this to his poor living conditions.  Pt reports suspected mold in his home, and infestation of roaches and mice, but reports he is in the process of working toward relocation.  Pt presents with decline in  ADLs and mobility related to dyspnea and post surgical LLE pain.  Pt will benefit from skilled OT to continue to maximize safety and indep with ADLs, and to work towards increased activity tolerance as pt lives alone.       If plan is discharge home, recommend the following:   A little help with walking and/or transfers;A little help with bathing/dressing/bathroom;Assistance with cooking/housework;Assist for transportation;Help with stairs or ramp for entrance     Functional Status Assessment   Patient has had a recent decline in their functional status and demonstrates the ability to make significant improvements in function in a reasonable and predictable amount of time.     Equipment Recommendations   Other (comment) (defer to next venue of care)     Recommendations for Other Services         Precautions/Restrictions   Precautions Precautions: Fall Restrictions Weight Bearing Restrictions Per Provider Order: No     Mobility Bed Mobility Overal bed mobility: Needs Assistance Bed Mobility: Sit to Supine       Sit to supine: Modified independent (Device/Increase time)     Patient Response: Cooperative  Transfers Overall transfer level: Needs assistance Equipment used: Straight cane Transfers: Sit to/from Stand Sit to Stand: Min assist           General transfer comment: LLE pain, increased time and effort      Balance Overall balance assessment: Needs assistance Sitting-balance support: Feet supported Sitting balance-Leahy Scale: Good     Standing balance support: Single extremity supported, During functional activity, Reliant on assistive device for balance Standing balance-Leahy Scale: Fair  ADL either performed or assessed with clinical judgement   ADL Overall ADL's : Needs assistance/impaired                     Lower Body Dressing: Minimal assistance Lower Body Dressing Details (indicate  cue type and reason): difficulty reaching to LLE post sx Toilet Transfer: Modified Independent;Grab bars   Toileting- Clothing Manipulation and Hygiene: Supervision/safety Toileting - Clothing Manipulation Details (indicate cue type and reason): Pt verbalizing SOB while toileting; supv necessary for guided PLB.     Functional mobility during ADLs: Cane;Minimal assistance General ADL Comments: ADLs limited by LE pain and dyspnea; 02 sats consistently 89-90% on room air with vc for PLB.     Vision Patient Visual Report: No change from baseline                         Pertinent Vitals/Pain Pain Assessment Pain Assessment: 0-10 Pain Score: 4  Pain Location: LLE Pain Descriptors / Indicators: Sore Pain Intervention(s): Limited activity within patient's tolerance, Monitored during session, Repositioned     Extremity/Trunk Assessment Upper Extremity Assessment Upper Extremity Assessment: Overall WFL for tasks assessed   Lower Extremity Assessment Lower Extremity Assessment: Generalized weakness   Cervical / Trunk Assessment Cervical / Trunk Assessment: Normal   Communication Communication Communication: No apparent difficulties   Cognition Arousal: Alert Behavior During Therapy: WFL for tasks assessed/performed                                 Following commands: Intact       Cueing  General Comments   Cueing Techniques: Verbal cues      Exercises Other Exercises Other Exercises: OT provided educ on EC strategies with rollator and PLB   Shoulder Instructions      Home Living Family/patient expects to be discharged to:: Private residence Living Arrangements: Other (Comment) (Boarding house) Available Help at Discharge: Family;Available PRN/intermittently Type of Home: House Home Access: Stairs to enter [employer] of Steps: 4 Entrance Stairs-Rails: None Home Layout: One level     Bathroom Shower/Tub: Scientist, Research (life Sciences): Handicapped height Bathroom Accessibility: Yes   Home Equipment: Agricultural Consultant (2 wheels);Cane - single point;Rollator (4 wheels)   Additional Comments: Pt reports he's currently in a boarding house, but reports it to be infested with mice and roaches, and likely mold.  Pt reports he's in the process of trying to relocate to other housing.      Prior Functioning/Environment Prior Level of Function : Independent/Modified Independent             Mobility Comments: SPC ADLs Comments: independent with BADLs, family assists with transportation, and occasionally meals and laundry depending on how pt feels on a given day.    OT Problem List: Decreased strength;Decreased knowledge of use of DME or AE;Decreased knowledge of precautions;Decreased activity tolerance;Cardiopulmonary status limiting activity;Impaired balance (sitting and/or standing);Pain   OT Treatment/Interventions: Self-care/ADL training;Therapeutic exercise;Patient/family education;Balance training;Energy conservation;Therapeutic activities;DME and/or AE instruction      OT Goals(Current goals can be found in the care plan section)   Acute Rehab OT Goals Patient Stated Goal: d/c to SNF to improve balance and activity tolerance OT Goal Formulation: With patient Time For Goal Achievement: 02/24/24 Potential to Achieve Goals: Good ADL Goals Pt Will Perform Grooming: with modified independence;standing;sitting Pt Will Perform Lower Body Dressing: with set-up;sit to/from stand Additional  ADL Goal #1: Pt will be indep to verbalize and demo 1-2 EC strategies in order to increase tolerance to BADLs.   OT Frequency:  Min 1X/week                  AM-PAC OT 6 Clicks Daily Activity     Outcome Measure Help from another person eating meals?: None Help from another person taking care of personal grooming?: A Little Help from another person toileting, which includes using toliet, bedpan, or urinal?:  A Little Help from another person bathing (including washing, rinsing, drying)?: A Little Help from another person to put on and taking off regular upper body clothing?: None Help from another person to put on and taking off regular lower body clothing?: A Little 6 Click Score: 20   End of Session Equipment Utilized During Treatment: Oxygen [facility]) Nurse Communication: Mobility status;Other (comment) (notified RN of 02 sats and pt's request for inhalers and breathing tx)  Activity Tolerance:   Patient left: in bed;with call bell/phone within reach  OT Visit Diagnosis: Unsteadiness on feet (R26.81);Muscle weakness (generalized) (M62.81);Pain Pain - Right/Left: Left Pain - part of body: Leg                Time: 8546-8486 OT Time Calculation (min): 20 min Charges:  OT General Charges $OT Visit: 1 Visit OT Evaluation $OT Eval Moderate Complexity: 1 Mod Inocente Blazing, MS, OTR/L Inocente MARLA Blazing 02/10/2024, 3:40 PM

## 2024-02-10 NOTE — Progress Notes (Signed)
 ANTICOAGULATION CONSULT NOTE  Pharmacy Consult for heparin  infusion Indication: Right lower leg ischemia, hx of the same, takes Eliquis  but is not always compliant with regimen.  Allergies[1]  Patient Measurements: Height: 5' 8 (172.7 cm) Weight: 54.4 kg (120 lb) IBW/kg (Calculated) : 68.4 HEPARIN  DW (KG): 54.4  Vital Signs: Temp: 97.5 F (36.4 C) (01/10 0357) Temp Source: Oral (01/10 0357) BP: 91/52 (01/10 0357) Pulse Rate: 69 (01/10 0357)  Labs: Recent Labs    02/09/24 0139 02/09/24 0452 02/09/24 2124 02/10/24 0635 02/10/24 0640  HGB 13.4  --   --   --  11.2*  HCT 41.8  --   --   --  32.2*  PLT 197  --   --   --  160  APTT  --  32 82*  --  75*  LABPROT 14.0  --   --   --   --   INR 1.0  --   --   --   --   HEPARINUNFRC  --  0.92*  --  0.57  --   CREATININE 2.05*  --   --   --  1.36*    Estimated Creatinine Clearance: 41.7 mL/min (A) (by C-G formula based on SCr of 1.36 mg/dL (H)).   Medical History: Past Medical History:  Diagnosis Date   A-fib (HCC)    CHF (congestive heart failure) (HCC)    COPD (chronic obstructive pulmonary disease) (HCC)    DVT (deep venous thrombosis) (HCC)    Hypertension    Peripheral arterial disease    Right-sided heart failure (HCC)    Smoker     Medications:  PTA Meds: Eliquis  5 mg BID, non-compliant  Assessment: Pt is a 64 yo male presenting to ED c/o left foot numbness and h/o DVT noncompliant with medications  Goal of Therapy:  Heparin  level 0.3-0.7 units/ml aPTT 66-102 seconds Monitor platelets by anticoagulation protocol: Yes  01/09 2124 aPTT 82, therapeutic x 1 01/10 0640 aPTT 75, HL 0.57, therapeutic x2 and correlating   Plan:  Continue heparin  infusion at 850 units/hr aPTT and HL have correlation, will check HL levels going forward Next HL with am labs HL & CBC daily while on heparin   Allean Haas PharmD Clinical Pharmacist 02/10/2024          [1]  Allergies Allergen Reactions   Iodine   Rash   Ivp Dye [Iodinated Contrast Media] Hives   Mushroom Other (See Comments)   Latex Hives and Itching   Iodinated Contrast Media Hives and Rash   Other Itching and Rash    Turkey Meat allergy per patient

## 2024-02-10 NOTE — Progress Notes (Signed)
 1 Day Post-Op   Subjective/Chief Complaint: States LEFT leg/foot feels better, Soreness at access site.   Objective: Vital signs in last 24 hours: Temp:  [97 F (36.1 C)-98.3 F (36.8 C)] 97.9 F (36.6 C) (01/10 1000) Pulse Rate:  [55-99] 71 (01/10 1000) Resp:  [9-25] 16 (01/10 1000) BP: (79-155)/(45-84) 111/56 (01/10 1000) SpO2:  [91 %-99 %] 93 % (01/10 1000) Last BM Date : 02/08/24  Intake/Output from previous day: 01/09 0701 - 01/10 0700 In: 1805.1 [P.O.:480; I.V.:1325.1] Out: 1100 [Urine:1100] Intake/Output this shift: Total I/O In: 240 [P.O.:240] Out: -   General appearance: alert and no distress Extremities: RIGHT groin access site- soft, no hematoma, left leg/foot warm, good capillary refill  Lab Results:  Recent Labs    02/09/24 0139 02/10/24 0640  WBC 4.7 5.1  HGB 13.4 11.2*  HCT 41.8 32.2*  PLT 197 160   BMET Recent Labs    02/09/24 0139 02/10/24 0640  NA 139 137  K 4.7 4.2  CL 102 107  CO2 25 24  GLUCOSE 107* 105*  BUN 40* 45*  CREATININE 2.05* 1.36*  CALCIUM  9.1 8.4*   PT/INR Recent Labs    02/09/24 0139  LABPROT 14.0  INR 1.0   ABG No results for input(s): PHART, HCO3 in the last 72 hours.  Invalid input(s): PCO2, PO2  Studies/Results: PERIPHERAL VASCULAR CATHETERIZATION Result Date: 02/09/2024 See surgical note for result.  US  Venous Img Lower Unilateral Left Result Date: 02/09/2024 EXAM: ULTRASOUND DUPLEX OF THE LEFT LOWER EXTREMITY VEINS TECHNIQUE: Duplex ultrasound using B-mode/gray scaled imaging and Doppler spectral analysis and color flow was obtained of the deep venous structures of the left lower extremity. COMPARISON: None available. CLINICAL HISTORY: Cool/numb, hx of DVT, takes Eliquis . FINDINGS: The common femoral vein, femoral vein, popliteal vein, and posterior tibial vein demonstrate normal compressibility with normal color flow and spectral analysis. Filling defect throughout the greater saphenous vein with  noncompressibility compatible with superficial thrombophlebitis. IMPRESSION: 1. No evidence of DVT. 2. Superficial thrombophlebitis of the greater saphenous vein. Electronically signed by: Franky Crease MD MD 02/09/2024 03:34 AM EST RP Workstation: [id number]    Anti-infectives: Anti-infectives (From admission, onward)    Start     Dose/Rate Route Frequency Ordered Stop   02/09/24 0935  ceFAZolin  (ANCEF ) IVPB 2g/100 mL premix        2 g 200 mL/hr over 30 Minutes Intravenous 30 min pre-op 02/09/24 0935 02/09/24 1350       Assessment/Plan: POD #1 s/p Right CFA angioplasty; extensive fem-fem and LEFT lower extremity thrombectomies/angioplasty/stenting OOB as tolerated D/C Heparin - transition to Eliquis - (ASA/Statin) Ok from Vascular Standpoint for disposition- Follow up in 3-4 weeks  LOS: 1 day    Tisa Dakin A 02/10/2024

## 2024-02-10 NOTE — Progress Notes (Signed)
 " PROGRESS NOTE    Christopher Bryan.  FMW:[id number] DOB: 1960-12-16 DOA: 02/09/2024 PCP: [facility]    Brief Narrative:   64 y.o. male with medical history significant of PAD, PVD status post multiple prior vascular inventions, DVT, chronic anticoagulation who presents for intermittent claudication symptoms that have markedly increased in severity in the left leg over the past 8 hours prior to admission.  Patient also endorsed coolness and numbness of left lower extremity.  Constant aching pain.  He has been prescribed Eliquis  and Plavix  however he admits to missing several doses.  Patient had a similar episode in September 2025 and underwent angiography which revealed a occluded femoral-femoral bypass graft subsequently treated with tPA and mechanical thrombectomy.   Patient also current smoker and history of COPD.  On presentation vascular was urgently consulted who recommended heparin  gtt. and Cath Lab for angiography and thrombectomy potentially progressing to open surgical repair and limb saving effort.   Assessment & Plan:   Principal Problem:   Critical limb ischemia of left lower extremity (HCC) Active Problems:   Acute lower limb ischemia  Acute left leg ischemia Symptoms started happening a week ago, acutely worsened on the night prior to presentation.  Reports intermittent compliance with Eliquis  or Plavix  and not taking over the last several days.  Vascular evaluated and noted his femorofemoral bypass graft to likely be occluded given lack of palpable pulses on the affected extremity. Underwent angiography with angioplasty and stent placement x 2 on 1/9.  Tolerated procedure well. Plan:  Seen by vascular today.  Cleared for transition off of IV heparin  onto oral Eliquis  Aspirin  and statin Engage therapy evaluations Anticipate discharge 1/11  AKI Unclear etiology.  Suspect prerenal azotemia.  Seems to be responding to intravenous fluids.  Creatinine  approaching baseline. Plan: Continue IVF x 1 additional day Recheck creatinine 1/11 AM   Severe PAD PVD On p.o. Eliquis  as above Continue aspirin  and statin Therapy evaluations   COPD Not acutely exacerbated continue home bronchodilator regimen   BPH Flomax    Hypertension Coreg    Hyperlipidemia Statin      DVT prophylaxis: Eliquis  Code Status: Full Family Communication: None Disposition Plan: Status is: Inpatient Remains inpatient appropriate because: Acute left leg ischemia status post angiography and intervention.  AKI on IV fluids.  Anticipate discharge 1/11.   Level of care: Telemetry  Consultants:  Vascular surgery  Procedures:  Angiography with angioplasty and stent placement  Antimicrobials: None   Subjective: Seen and examined peer resting in bed.  Reports improvement in left lower extremity pain.  Objective: Vitals:   02/09/24 2018 02/09/24 2112 02/10/24 0357 02/10/24 1000  BP:  102/84 (!) 91/52 (!) 111/56  Pulse: 69 62 69 71  Resp:   16 16  Temp: 98.3 F (36.8 C)  (!) 97.5 F (36.4 C) 97.9 F (36.6 C)  TempSrc: Oral  Oral Oral  SpO2: 97%  93% 93%  Weight:      Height:        Intake/Output Summary (Last 24 hours) at 02/10/2024 1321 Last data filed at 02/10/2024 1029 Gross per 24 hour  Intake 2045.08 ml  Output 1100 ml  Net 945.08 ml   Filed Weights   02/09/24 0138  Weight: 54.4 kg    Examination:  General exam: Appears calm and comfortable  Respiratory system: Clear to auscultation. Respiratory effort normal. Cardiovascular system: S1-S2, RRR, no murmurs, no pedal edema Gastrointestinal system: Thin, soft, NT/ND, normal bowel sounds Central nervous system:  Alert and oriented. No focal neurological deficits. Extremities: Palpable pedal pulses bilaterally.  Gait not assessed Skin: No rashes, lesions or ulcers Psychiatry: Judgement and insight appear normal. Mood & affect appropriate.     Data Reviewed: I have personally  reviewed following labs and imaging studies  CBC: Recent Labs  Lab 02/09/24 0139 02/10/24 0640  WBC 4.7 5.1  NEUTROABS 2.4  --   HGB 13.4 11.2*  HCT 41.8 32.2*  MCV 90.3 91.2  PLT 197 160   Basic Metabolic Panel: Recent Labs  Lab 02/09/24 0139 02/10/24 0640  NA 139 137  K 4.7 4.2  CL 102 107  CO2 25 24  GLUCOSE 107* 105*  BUN 40* 45*  CREATININE 2.05* 1.36*  CALCIUM  9.1 8.4*   GFR: Estimated Creatinine Clearance: 41.7 mL/min (A) (by C-G formula based on SCr of 1.36 mg/dL (H)). Liver Function Tests: No results for input(s): AST, ALT, ALKPHOS, BILITOT, PROT, ALBUMIN in the last 168 hours. No results for input(s): LIPASE, AMYLASE in the last 168 hours. No results for input(s): AMMONIA in the last 168 hours. Coagulation Profile: Recent Labs  Lab 02/09/24 0139  INR 1.0   Cardiac Enzymes: No results for input(s): CKTOTAL, CKMB, CKMBINDEX, TROPONINI in the last 168 hours. BNP (last 3 results) No results for input(s): PROBNP in the last 8760 hours. HbA1C: No results for input(s): HGBA1C in the last 72 hours. CBG: No results for input(s): GLUCAP in the last 168 hours. Lipid Profile: No results for input(s): CHOL, HDL, LDLCALC, TRIG, CHOLHDL, LDLDIRECT in the last 72 hours. Thyroid Function Tests: No results for input(s): TSH, T4TOTAL, FREET4, T3FREE, THYROIDAB in the last 72 hours. Anemia Panel: No results for input(s): VITAMINB12, FOLATE, FERRITIN, TIBC, IRON, RETICCTPCT in the last 72 hours. Sepsis Labs: No results for input(s): PROCALCITON, LATICACIDVEN in the last 168 hours.  No results found for this or any previous visit (from the past 240 hours).       Radiology Studies: PERIPHERAL VASCULAR CATHETERIZATION Result Date: 02/09/2024 See surgical note for result.  US  Venous Img Lower Unilateral Left Result Date: 02/09/2024 EXAM: ULTRASOUND DUPLEX OF THE LEFT LOWER EXTREMITY VEINS  TECHNIQUE: Duplex ultrasound using B-mode/gray scaled imaging and Doppler spectral analysis and color flow was obtained of the deep venous structures of the left lower extremity. COMPARISON: None available. CLINICAL HISTORY: Cool/numb, hx of DVT, takes Eliquis . FINDINGS: The common femoral vein, femoral vein, popliteal vein, and posterior tibial vein demonstrate normal compressibility with normal color flow and spectral analysis. Filling defect throughout the greater saphenous vein with noncompressibility compatible with superficial thrombophlebitis. IMPRESSION: 1. No evidence of DVT. 2. Superficial thrombophlebitis of the greater saphenous vein. Electronically signed by: Franky Crease MD MD 02/09/2024 03:34 AM EST RP Workstation: [id number]        Scheduled Meds:  apixaban   5 mg Oral BID   atorvastatin   20 mg Oral QHS   carvedilol   12.5 mg Oral BID   cholecalciferol   2,000 Units Oral Daily   fluticasone  furoate-vilanterol  1 puff Inhalation Daily   gabapentin   600 mg Oral TID   melatonin  5 mg Oral QHS   mirtazapine   7.5 mg Oral QHS   pantoprazole   20 mg Oral Daily   tamsulosin   0.4 mg Oral QPC supper   umeclidinium bromide   1 puff Inhalation Daily   Continuous Infusions:  sodium chloride  75 mL/hr at 02/10/24 0954     LOS: 1 day     Calvin KATHEE Robson, MD [facility]  If 7PM-7AM, please contact night-coverage  02/10/2024, 1:21 PM   "

## 2024-02-10 NOTE — Plan of Care (Signed)

## 2024-02-10 NOTE — Plan of Care (Signed)

## 2024-02-10 NOTE — Evaluation (Signed)
 Physical Therapy Evaluation Patient Details Name: Christopher Bryan. MRN: [id number] DOB: 1961-01-08 Today's Date: 02/10/2024  History of Present Illness  Christopher Bryan. is a 64 y.o. male with medical history significant of PAD, PVD status post multiple prior vascular inventions, DVT, chronic anticoagulation who presents for intermittent claudication symptoms that have markedly increased in severity in the left leg over the past 8 hours prior to admission.  Patient also endorsed coolness and numbness of left lower extremity.  Constant aching pain.  He has been prescribed Eliquis  and Plavix  however he admits to missing several doses.  Patient had a similar episode in September 2025 and underwent angiography which revealed a occluded femoral-femoral bypass graft subsequently treated with tPA and mechanical thrombectomy.     Patient also current smoker and history of COPD.  On presentation vascular was urgently consulted who recommended heparin  gtt. and Cath Lab for angiography and thrombectomy potentially progressing to open surgical repair and limb saving effort.  Clinical Impression  Patient noted to be in seated position at PT arrival in room, for an initial PT evaluation due to a decline in functional status, with baseline mobility reported as modI, and currently requiring minA for transfers and short ambulation in room 25' with [facility]; required rest break walking back from the door in theroom. The patient is A&O x 4, presenting with good willingness to work with PT. The patient resides in a boarding home and lives alone with limited family/friend support. There are 4 STE inside the residence; which has no railing and pt. Expressed having near falls on them before.  Vitals are uinstable with an SpO? of ~89% on RA; pt SOB throughout. The overall clinical impression is that the patient presents with mild to moderate mobility limitations; secondary to SOB. Recommended skilled PT will address safety,  mobility, and discharge planning.        If plan is discharge home, recommend the following: Help with stairs or ramp for entrance;A little help with walking and/or transfers;Assist for transportation   Can travel by private vehicle   Yes    Equipment Recommendations Rolling walker (2 wheels)  Recommendations for Other Services       Functional Status Assessment Patient has had a recent decline in their functional status and demonstrates the ability to make significant improvements in function in a reasonable and predictable amount of time.     Precautions / Restrictions Precautions Precautions: Fall Restrictions Weight Bearing Restrictions Per Provider Order: No      Mobility  Bed Mobility Overal bed mobility: Needs Assistance Bed Mobility: Sit to Supine       Sit to supine: Modified independent (Device/Increase time)        Transfers Overall transfer level: Needs assistance Equipment used: Straight cane Transfers: Sit to/from Stand Sit to Stand: Modified independent (Device/Increase time)           General transfer comment: LLE pain, increased time and effort    Ambulation/Gait Ambulation/Gait assistance: Min assist Gait Distance (Feet): 25 Feet Assistive device: Straight cane Gait Pattern/deviations: Step-through pattern, Staggering left, Staggering right Gait velocity: decreased     General Gait Details: very SOB with short gait has to rest half way to the door when coming back  Stairs            Wheelchair Mobility     Tilt Bed    Modified Rankin (Stroke Patients Only)       Balance Overall balance assessment: Needs assistance Sitting-balance support: Feet  supported Sitting balance-Leahy Scale: Good     Standing balance support: Single extremity supported, During functional activity, Reliant on assistive device for balance Standing balance-Leahy Scale: Fair                               Pertinent Vitals/Pain  Pain Assessment Pain Score: 4  Pain Location: LLE Pain Descriptors / Indicators: Sore Pain Intervention(s): Monitored during session, Repositioned, Limited activity within patient's tolerance    Home Living Family/patient expects to be discharged to:: Private residence Living Arrangements: Other (Comment) (Boarding house) Available Help at Discharge: Family;Available PRN/intermittently Type of Home: House Home Access: Stairs to enter Entrance Stairs-Rails: None Entrance Stairs-Number of Steps: 4   Home Layout: One level Home Equipment: Agricultural Consultant (2 wheels);Cane - single point;Rollator (4 wheels) Additional Comments: Pt reports he's currently in a boarding house, but reports it to be infested with mice and roaches, and likely mold.  Pt reports he's in the process of trying to relocate to other housing.    Prior Function Prior Level of Function : Independent/Modified Independent             Mobility Comments: SPC ADLs Comments: independent with BADLs, family assists with transportation, and occasionally meals and laundry depending on how pt feels on a given day.     Extremity/Trunk Assessment   Upper Extremity Assessment Upper Extremity Assessment: Overall WFL for tasks assessed    Lower Extremity Assessment Lower Extremity Assessment: Generalized weakness    Cervical / Trunk Assessment Cervical / Trunk Assessment: Normal  Communication   Communication Communication: No apparent difficulties    Cognition Arousal: Alert Behavior During Therapy: WFL for tasks assessed/performed                             Following commands: Intact       Cueing Cueing Techniques: Verbal cues     General Comments      Exercises     Assessment/Plan    PT Assessment Patient needs continued PT services  PT Problem List Decreased activity tolerance;Decreased strength;Decreased mobility       PT Treatment Interventions DME instruction;Gait training;Stair  training;Functional mobility training;Therapeutic activities;Neuromuscular re-education;Balance training;Therapeutic exercise    PT Goals (Current goals can be found in the Care Plan section)  Acute Rehab PT Goals Patient Stated Goal: pt wants to go to SNF PT Goal Formulation: With patient Time For Goal Achievement: 02/24/24 Potential to Achieve Goals: Good    Frequency Min 2X/week     Co-evaluation               AM-PAC PT 6 Clicks Mobility  Outcome Measure Help needed turning from your back to your side while in a flat bed without using bedrails?: None Help needed moving from lying on your back to sitting on the side of a flat bed without using bedrails?: None Help needed moving to and from a bed to a chair (including a wheelchair)?: A Little Help needed standing up from a chair using your arms (e.g., wheelchair or bedside chair)?: A Little Help needed to walk in hospital room?: A Little Help needed climbing 3-5 steps with a railing? : A Lot 6 Click Score: 19    End of Session   Activity Tolerance: Patient limited by fatigue Patient left: in bed;with call bell/phone within reach Nurse Communication: Mobility status PT Visit Diagnosis: Other abnormalities of gait  and mobility (R26.89)    Time: 8544-8485 PT Time Calculation (min) (ACUTE ONLY): 19 min   Charges:   PT Evaluation $PT Eval Low Complexity: 1 Low   PT General Charges $$ ACUTE PT VISIT: 1 Visit         Sherlean Lesches DPT, PT    Sherlean A Kasson Lamere 02/10/2024, 3:41 PM

## 2024-02-11 ENCOUNTER — Inpatient Hospital Stay

## 2024-02-11 LAB — CBC WITH DIFFERENTIAL/PLATELET
Abs Immature Granulocytes: 0.03 K/uL (ref 0.00–0.07)
Basophils Absolute: 0 K/uL (ref 0.0–0.1)
Basophils Relative: 0 %
Eosinophils Absolute: 0.1 K/uL (ref 0.0–0.5)
Eosinophils Relative: 2 %
HCT: 30.4 % — ABNORMAL LOW (ref 39.0–52.0)
Hemoglobin: 9.9 g/dL — ABNORMAL LOW (ref 13.0–17.0)
Immature Granulocytes: 1 %
Lymphocytes Relative: 42 %
Lymphs Abs: 2 K/uL (ref 0.7–4.0)
MCH: 29.3 pg (ref 26.0–34.0)
MCHC: 32.6 g/dL (ref 30.0–36.0)
MCV: 89.9 fL (ref 80.0–100.0)
Monocytes Absolute: 0.5 K/uL (ref 0.1–1.0)
Monocytes Relative: 10 %
Neutro Abs: 2.2 K/uL (ref 1.7–7.7)
Neutrophils Relative %: 45 %
Platelets: 122 K/uL — ABNORMAL LOW (ref 150–400)
RBC: 3.38 MIL/uL — ABNORMAL LOW (ref 4.22–5.81)
RDW: 12.8 % (ref 11.5–15.5)
WBC: 4.8 K/uL (ref 4.0–10.5)
nRBC: 0 % (ref 0.0–0.2)

## 2024-02-11 LAB — BASIC METABOLIC PANEL WITH GFR
Anion gap: 5 (ref 5–15)
BUN: 25 mg/dL — ABNORMAL HIGH (ref 8–23)
CO2: 23 mmol/L (ref 22–32)
Calcium: 8.4 mg/dL — ABNORMAL LOW (ref 8.9–10.3)
Chloride: 111 mmol/L (ref 98–111)
Creatinine, Ser: 0.94 mg/dL (ref 0.61–1.24)
GFR, Estimated: >60 mL/min
Glucose, Bld: 88 mg/dL (ref 70–99)
Potassium: 4.4 mmol/L (ref 3.5–5.1)
Sodium: 139 mmol/L (ref 135–145)

## 2024-02-11 MED ORDER — FUROSEMIDE 10 MG/ML IJ SOLN
40.0000 mg | Freq: Once | INTRAMUSCULAR | Status: AC
  Administered 2024-02-11: 40 mg via INTRAVENOUS
  Filled 2024-02-11: qty 4

## 2024-02-11 MED ORDER — FAMOTIDINE 20 MG PO TABS
20.0000 mg | ORAL_TABLET | Freq: Two times a day (BID) | ORAL | Status: DC
  Administered 2024-02-11 – 2024-02-12 (×3): 20 mg via ORAL
  Filled 2024-02-11 (×3): qty 1

## 2024-02-11 NOTE — Plan of Care (Signed)

## 2024-02-11 NOTE — Progress Notes (Signed)
 " PROGRESS NOTE    Christopher Bryan.  FMW:[id number] DOB: 05-04-1960 DOA: 02/09/2024 PCP: [facility]    Brief Narrative:   64 y.o. male with medical history significant of PAD, PVD status post multiple prior vascular inventions, DVT, chronic anticoagulation who presents for intermittent claudication symptoms that have markedly increased in severity in the left leg over the past 8 hours prior to admission.  Patient also endorsed coolness and numbness of left lower extremity.  Constant aching pain.  He has been prescribed Eliquis  and Plavix  however he admits to missing several doses.  Patient had a similar episode in September 2025 and underwent angiography which revealed a occluded femoral-femoral bypass graft subsequently treated with tPA and mechanical thrombectomy.   Patient also current smoker and history of COPD.  On presentation vascular was urgently consulted who recommended heparin  gtt. and Cath Lab for angiography and thrombectomy potentially progressing to open surgical repair and limb saving effort.   Assessment & Plan:   Principal Problem:   Critical limb ischemia of left lower extremity (HCC) Active Problems:   Acute lower limb ischemia  Acute left leg ischemia Symptoms started happening a week ago, acutely worsened on the night prior to presentation.  Reports intermittent compliance with Eliquis  or Plavix  and not taking over the last several days.  Vascular evaluated and noted his femorofemoral bypass graft to likely be occluded given lack of palpable pulses on the affected extremity. Underwent angiography with angioplasty and stent placement x 2 on 1/9.  Tolerated procedure well. Plan:  Continue oral Eliquis  Continue aspirin  and statin Therapy following Current recommendation for skilled nursing facility [facility] made aware  AKI Likely prerenal azotemia Now resolved, creatinine at baseline Plan: DC IVF   Severe PAD PVD On p.o. Eliquis  as above Continue  aspirin  and statin Therapy evaluations Skilled nursing facility referral   COPD Not exacerbated Continue home regimen   BPH Continue Flomax    Hypertension Continue Coreg    Hyperlipidemia Continue statin      DVT prophylaxis: Eliquis  Code Status: Full Family Communication: None Disposition Plan: Status is: Inpatient Remains inpatient appropriate because: Acute left leg ischemia.  Status post angiography and intervention.  Current recommendation for skilled nursing facility.  Bed search in progress.  Otherwise medically stable.   Level of care: Telemetry  Consultants:  Vascular surgery  Procedures:  Angiography with angioplasty and stent placement  Antimicrobials: None   Subjective: Seen and examined.  No acute events overnight.  Reports fatigue with any ambulation effort.  Objective: Vitals:   02/10/24 1947 02/10/24 2119 02/11/24 0336 02/11/24 0809  BP: 121/62 115/76 122/72 (!) 138/59  Pulse: 73  68 74  Resp: 18  16 18   Temp: 98.6 F (37 C)  98.4 F (36.9 C) 98.6 F (37 C)  TempSrc: Oral   Oral  SpO2: 93%  93% 95%  Weight:      Height:        Intake/Output Summary (Last 24 hours) at 02/11/2024 1155 Last data filed at 02/11/2024 1004 Gross per 24 hour  Intake 3051.89 ml  Output 1900 ml  Net 1151.89 ml   Filed Weights   02/09/24 0138  Weight: 54.4 kg    Examination:  General exam: NAD Respiratory system: Clear.  Normal work of breathing.  Room air Cardiovascular system: S1-S2, RRR, no murmurs, no pedal edema Gastrointestinal system: Thin, soft, NT/ND, normal bowel sounds Central nervous system: Alert and oriented. No focal neurological deficits. Extremities: Palpable pedal pulses bilaterally.  Gait  not assessed Skin: No rashes, lesions or ulcers Psychiatry: Judgement and insight appear normal. Mood & affect appropriate.     Data Reviewed: I have personally reviewed following labs and imaging studies  CBC: Recent Labs  Lab  02/09/24 0139 02/10/24 0640 02/11/24 0624  WBC 4.7 5.1 4.8  NEUTROABS 2.4  --  2.2  HGB 13.4 11.2* 9.9*  HCT 41.8 32.2* 30.4*  MCV 90.3 91.2 89.9  PLT 197 160 122*   Basic Metabolic Panel: Recent Labs  Lab 02/09/24 0139 02/10/24 0640 02/11/24 0624  NA 139 137 139  K 4.7 4.2 4.4  CL 102 107 111  CO2 25 24 23   GLUCOSE 107* 105* 88  BUN 40* 45* 25*  CREATININE 2.05* 1.36* 0.94  CALCIUM  9.1 8.4* 8.4*   GFR: Estimated Creatinine Clearance: 60.3 mL/min (by C-G formula based on SCr of 0.94 mg/dL). Liver Function Tests: No results for input(s): AST, ALT, ALKPHOS, BILITOT, PROT, ALBUMIN in the last 168 hours. No results for input(s): LIPASE, AMYLASE in the last 168 hours. No results for input(s): AMMONIA in the last 168 hours. Coagulation Profile: Recent Labs  Lab 02/09/24 0139  INR 1.0   Cardiac Enzymes: No results for input(s): CKTOTAL, CKMB, CKMBINDEX, TROPONINI in the last 168 hours. BNP (last 3 results) No results for input(s): PROBNP in the last 8760 hours. HbA1C: No results for input(s): HGBA1C in the last 72 hours. CBG: No results for input(s): GLUCAP in the last 168 hours. Lipid Profile: No results for input(s): CHOL, HDL, LDLCALC, TRIG, CHOLHDL, LDLDIRECT in the last 72 hours. Thyroid Function Tests: No results for input(s): TSH, T4TOTAL, FREET4, T3FREE, THYROIDAB in the last 72 hours. Anemia Panel: No results for input(s): VITAMINB12, FOLATE, FERRITIN, TIBC, IRON, RETICCTPCT in the last 72 hours. Sepsis Labs: No results for input(s): PROCALCITON, LATICACIDVEN in the last 168 hours.  No results found for this or any previous visit (from the past 240 hours).       Radiology Studies: No results found.       Scheduled Meds:  apixaban   5 mg Oral BID   atorvastatin   20 mg Oral QHS   carvedilol   12.5 mg Oral BID   cholecalciferol   2,000 Units Oral Daily   fluticasone   furoate-vilanterol  1 puff Inhalation Daily   gabapentin   600 mg Oral TID   melatonin  5 mg Oral QHS   mirtazapine   7.5 mg Oral QHS   pantoprazole   20 mg Oral Daily   tamsulosin   0.4 mg Oral QPC supper   umeclidinium bromide   1 puff Inhalation Daily   Continuous Infusions:     LOS: 2 days     Calvin KATHEE Robson, MD [facility]   If 7PM-7AM, please contact night-coverage  02/11/2024, 11:55 AM   "

## 2024-02-11 NOTE — Progress Notes (Signed)
 PT Cancellation Note  Patient Details Name: Christopher Bryan. MRN: [id number] DOB: 1960/07/27   Cancelled Treatment:     Pt resting in bed, politely declined PT session due to c/o acid reflux and LE pain. Will re-attempt next available date per POC.    Darice JAYSON Bohr 02/11/2024, 2:19 PM

## 2024-02-11 NOTE — TOC Initial Note (Signed)
 Transition of Care (TOC) - Initial/Assessment Note    Patient Details  Name: Christopher Bryan. MRN: [id number] Date of Birth: [birth year]  Transition of Care [facility]) CM/SW Contact:    Victory Jackquline RAMAN, RN Phone Number: 02/11/2024, 2:44 PM  Clinical Narrative:                 RNCM spoke with patient, introduced myself and my role and explained that discharge planning would be discussed. PT is recommending STR. Patient in agreement with STR. Patient is not familiar with the SNF's in the area and would like for us  to submit referral's. Patient is a veteran and is planning to notify the [place] on Monday morning that he is here in the hospital. [facility] will follow up with [facility] and the [facility] on Monday, 02/12/2024 about SNF placement. Patient would like to use his VA benefits even though he has [facility] and Medicaid. RNCM will continue to follow for discharge planning needs.   Expected Discharge Plan: Skilled Nursing Facility Barriers to Discharge: Continued Medical Work up   Patient Goals and CMS Choice            Expected Discharge Plan and Services       Living arrangements for the past 2 months: Boarding House                                      Prior Living Arrangements/Services Living arrangements for the past 2 months: [employer] Lives with:: Self Patient language and need for interpreter reviewed:: Yes Do you feel safe going back to the place where you live?: Yes      Need for Family Participation in Patient Care: Yes (Comment) Care giver support system in place?: Yes (comment) Current home services: DME Criminal Activity/Legal Involvement Pertinent to Current Situation/Hospitalization: No - Comment as needed  Activities of Daily Living   ADL Screening (condition at time of admission) Independently performs ADLs?: No Does the patient have a NEW difficulty with bathing/dressing/toileting/self-feeding that is expected to last >3 days?: No Does the patient have a NEW  difficulty with getting in/out of bed, walking, or climbing stairs that is expected to last >3 days?: No Does the patient have a NEW difficulty with communication that is expected to last >3 days?: No Is the patient deaf or have difficulty hearing?: No Does the patient have difficulty seeing, even when wearing glasses/contacts?: No Does the patient have difficulty concentrating, remembering, or making decisions?: No  Permission Sought/Granted                  Emotional Assessment Appearance:: Appears stated age Attitude/Demeanor/Rapport: Gracious, Self-Confident, Engaged Affect (typically observed): Quiet, Pleasant Orientation: : Oriented to Self, Oriented to Place, Oriented to  Time, Oriented to Situation Alcohol  / Substance Use: Not Applicable Psych Involvement: No (comment)  Admission diagnosis:  Acute kidney injury [N17.9] Critical limb ischemia of left lower extremity (HCC) [I70.222] Acute lower limb ischemia [I99.8] Patient Active Problem List   Diagnosis Date Noted   Acute lower limb ischemia 02/09/2024   BPH (benign prostatic hyperplasia) 10/20/2023   Paroxysmal atrial fibrillation (HCC) 10/20/2023   Pneumonia of left lung due to infectious organism 03/30/2023   Acute on chronic systolic CHF (congestive heart failure) (HCC) 03/29/2023   Arm wound, right, sequela 03/29/2023   Elevated liver function tests 03/29/2023   COPD exacerbation (HCC) 03/01/2023   Essential hypertension 03/01/2023  Dyslipidemia 03/01/2023   GERD without esophagitis 03/01/2023   Peripheral arterial disease 03/01/2023   History of DVT (deep vein thrombosis) 03/01/2023   COVID-19 virus infection 03/01/2023   Acute respiratory failure with hypoxia (HCC) 02/28/2023   Protein-calorie malnutrition, severe 01/31/2023   Cardiac arrest (HCC) 01/30/2023   Resistant hypertension 01/29/2023   Acute on chronic respiratory failure with hypoxia and hypercapnia (HCC) 01/26/2023   Acute respiratory failure  with hypoxia and hypercapnia (HCC) 01/26/2023   Non-STEMI (non-ST elevated myocardial infarction) (HCC) 01/26/2023   History of acute anterior wall MI 01/26/2023   COPD, very severe (HCC) 01/26/2023   Cocaine abuse (HCC) 01/26/2023   Limb ischemia 06/20/2022   Hepatitis C virus infection 09/08/2021   Hyperkalemia 09/08/2021   Critical limb ischemia of left lower extremity (HCC) 09/07/2021   Critical ischemia of foot (HCC) 09/07/2021   AKI (acute kidney injury) 09/07/2021   Protein-calorie malnutrition, severe 01/22/2020   Nontraumatic ischemic infarction of muscle, left lower leg 01/20/2020   Dehydration 01/20/2020   Arterial occlusion, lower extremity 01/20/2020   Hypertension    Nicotine  dependence    COPD (chronic obstructive pulmonary disease) with emphysema (HCC)    PAD (peripheral artery disease)    Acute hypoxemic respiratory failure (HCC) 01/08/2018   Chronic diastolic CHF (congestive heart failure) (HCC) 06/21/2016   Acute right-sided heart failure (HCC) 06/21/2016   PCP:  [facility]:   [facility] [address], [place] - [address] [place] [address] Phone: [phone] Fax: [phone]  [facility] [address] [place] [address] Phone: [phone] Fax: [phone]  [facility] [place], [place] - [address] [place] [address] Phone: [phone] Fax: [phone]     Social Drivers of Health (SDOH) Social History: SDOH Screenings   Food Insecurity: No Food Insecurity (02/09/2024)  Housing: Low Risk (02/09/2024)  Transportation Needs: No Transportation Needs (02/09/2024)  Utilities: Not At Risk (02/09/2024)  Financial Resource Strain: Low Risk (03/29/2023)  Social Connections: Unknown (02/09/2024)  Tobacco Use: High Risk (02/09/2024)   SDOH Interventions:     Readmission Risk Interventions    03/29/2023   12:29 PM 03/02/2023    10:50 AM  Readmission Risk Prevention Plan  Transportation Screening Complete Complete  Medication Review (RN Care Manager) Complete Complete  PCP or Specialist appointment within 3-5 days of discharge Complete Complete  SW Recovery Care/Counseling Consult Complete Complete  Palliative Care Screening Not Applicable Not Applicable  Skilled Nursing Facility Not Applicable Not Applicable

## 2024-02-12 MED ORDER — OXYCODONE HCL 5 MG PO TABS: 5.0000 mg | ORAL_TABLET | ORAL | 0 refills | Status: AC | PRN

## 2024-02-12 NOTE — NC FL2 (Signed)
 " [place]  MEDICAID FL2 LEVEL OF CARE FORM     IDENTIFICATION  Patient Name: Christopher Bryan. Birthdate: 1960/06/29 Sex: male Admission Date (Current Location): 02/09/2024  [place] and [place] Number:  [place] [id number] [facility] Facility and Address:  [facility], [address] [place], [place] [address]      Provider Number: [id number]  Attending Physician Name and Address:  Jhonny Calvin NOVAK, MD  Relative Name and Phone Number:  Edder Bellanca   [phone]    Current Level of Care: Hospital Recommended Level of Care: Skilled Nursing Facility Prior Approval Number:    Date Approved/Denied:   PASRR Number:    Discharge Plan:      Current Diagnoses: Patient Active Problem List   Diagnosis Date Noted   Acute lower limb ischemia 02/09/2024   BPH (benign prostatic hyperplasia) 10/20/2023   Paroxysmal atrial fibrillation (HCC) 10/20/2023   Pneumonia of left lung due to infectious organism 03/30/2023   Acute on chronic systolic CHF (congestive heart failure) (HCC) 03/29/2023   Arm wound, right, sequela 03/29/2023   Elevated liver function tests 03/29/2023   COPD exacerbation (HCC) 03/01/2023   Essential hypertension 03/01/2023   Dyslipidemia 03/01/2023   GERD without esophagitis 03/01/2023   Peripheral arterial disease 03/01/2023   History of DVT (deep vein thrombosis) 03/01/2023   COVID-19 virus infection 03/01/2023   Acute respiratory failure with hypoxia (HCC) 02/28/2023   Protein-calorie malnutrition, severe 01/31/2023   Cardiac arrest (HCC) 01/30/2023   Resistant hypertension 01/29/2023   Acute on chronic respiratory failure with hypoxia and hypercapnia (HCC) 01/26/2023   Acute respiratory failure with hypoxia and hypercapnia (HCC) 01/26/2023   Non-STEMI (non-ST elevated myocardial infarction) (HCC) 01/26/2023   History of acute anterior wall MI 01/26/2023   COPD, very severe (HCC) 01/26/2023   Cocaine abuse (HCC) 01/26/2023   Limb  ischemia 06/20/2022   Hepatitis C virus infection 09/08/2021   Hyperkalemia 09/08/2021   Critical limb ischemia of left lower extremity (HCC) 09/07/2021   Critical ischemia of foot (HCC) 09/07/2021   AKI (acute kidney injury) 09/07/2021   Protein-calorie malnutrition, severe 01/22/2020   Nontraumatic ischemic infarction of muscle, left lower leg 01/20/2020   Dehydration 01/20/2020   Arterial occlusion, lower extremity 01/20/2020   Hypertension    Nicotine  dependence    COPD (chronic obstructive pulmonary disease) with emphysema (HCC)    PAD (peripheral artery disease)    Acute hypoxemic respiratory failure (HCC) 01/08/2018   Chronic diastolic CHF (congestive heart failure) (HCC) 06/21/2016   Acute right-sided heart failure (HCC) 06/21/2016    Orientation RESPIRATION BLADDER Height & Weight     Self, Time, Situation, Place  Normal Continent Weight: 120 lb (54.4 kg) Height:  5' 8 (172.7 cm)  BEHAVIORAL SYMPTOMS/MOOD NEUROLOGICAL BOWEL NUTRITION STATUS      Continent Diet (Diet Heart Room service appropriate? Yes; Fluid consistency: Thin: Cardiac starting at 01/09 1248)  AMBULATORY STATUS COMMUNICATION OF NEEDS Skin     Verbally Normal                       Personal Care Assistance Level of Assistance              Functional Limitations Info  Sight, Hearing, Speech Sight Info: Adequate Hearing Info: Adequate Speech Info: Adequate    SPECIAL CARE FACTORS FREQUENCY  PT (By licensed PT), OT (By licensed OT)     PT Frequency: 5x OT Frequency: 5x  Contractures Contractures Info: Not present    Additional Factors Info                  Current Medications (02/12/2024):  This is the current hospital active medication list Current Facility-Administered Medications  Medication Dose Route Frequency Provider Last Rate Last Admin   acetaminophen  (TYLENOL ) tablet 650 mg  650 mg Oral Q6H PRN Dew, Jason S, MD   650 mg at 02/11/24 2056   albuterol   (PROVENTIL ) (2.5 MG/3ML) 0.083% nebulizer solution 3 mL  3 mL Inhalation Q6H PRN Dew, Jason S, MD   3 mL at 02/10/24 1516   apixaban  (ELIQUIS ) tablet 5 mg  5 mg Oral BID Merrill, Kristin A, RPH   5 mg at 02/12/24 9141   artificial tears ophthalmic solution 1 drop  1 drop Both Eyes QID PRN Marea Selinda RAMAN, MD       atorvastatin  (LIPITOR ) tablet 20 mg  20 mg Oral QHS Dew, Jason S, MD   20 mg at 02/11/24 2057   carvedilol  (COREG ) tablet 12.5 mg  12.5 mg Oral BID Dew, Jason S, MD   12.5 mg at 02/12/24 0858   cholecalciferol  (VITAMIN D3) 25 MCG (1000 UNIT) tablet 2,000 Units  2,000 Units Oral Daily Dew, Jason S, MD   2,000 Units at 02/12/24 9141   famotidine  (PEPCID ) tablet 20 mg  20 mg Oral BID Sreenath, Sudheer B, MD   20 mg at 02/12/24 0858   fluticasone  (FLONASE ) 50 MCG/ACT nasal spray 1 spray  1 spray Each Nare Daily PRN Marea Selinda RAMAN, MD       fluticasone  furoate-vilanterol (BREO ELLIPTA ) 200-25 MCG/ACT 1 puff  1 puff Inhalation Daily Marea Selinda RAMAN, MD   1 puff at 02/12/24 0859   gabapentin  (NEURONTIN ) capsule 600 mg  600 mg Oral TID Dew, Jason S, MD   600 mg at 02/12/24 9141   HYDROmorphone  (DILAUDID ) injection 0.5-1 mg  0.5-1 mg Intravenous Q4H PRN Dew, Jason S, MD   1 mg at 02/12/24 1015   melatonin tablet 5 mg  5 mg Oral QHS Dew, Jason S, MD   5 mg at 02/11/24 2057   methocarbamol  (ROBAXIN ) tablet 500 mg  500 mg Oral Q8H PRN Dew, Jason S, MD       mirtazapine  (REMERON ) tablet 7.5 mg  7.5 mg Oral QHS Dew, Jason S, MD   7.5 mg at 02/11/24 2057   ondansetron  (ZOFRAN ) tablet 4 mg  4 mg Oral Q6H PRN Dew, Jason S, MD       Or   ondansetron  (ZOFRAN ) injection 4 mg  4 mg Intravenous Q6H PRN Dew, Jason S, MD       oxyCODONE  (Oxy IR/ROXICODONE ) immediate release tablet 5 mg  5 mg Oral Q4H PRN Dew, Jason S, MD   5 mg at 02/12/24 9141   pantoprazole  (PROTONIX ) EC tablet 20 mg  20 mg Oral Daily Dew, Jason S, MD   20 mg at 02/12/24 9141   polyethylene glycol powder (GLYCOLAX /MIRALAX ) container 17 g  17 g Oral  Daily PRN Dew, Jason S, MD       senna-docusate (Senokot-S) tablet 1-2 tablet  1-2 tablet Oral QHS PRN Dew, Jason S, MD   1 tablet at 02/12/24 9141   tamsulosin  (FLOMAX ) capsule 0.4 mg  0.4 mg Oral QPC supper Dew, Jason S, MD   0.4 mg at 02/11/24 1625   umeclidinium bromide  (INCRUSE ELLIPTA ) 62.5 MCG/ACT 1 puff  1 puff Inhalation Daily Dew, Selinda RAMAN, MD  1 puff at 02/12/24 0859     Discharge Medications: Please see discharge summary for a list of discharge medications.  Relevant Imaging Results:  Relevant Lab Results:   Additional Information [id number]  Alfonso Rummer, LCSW     "

## 2024-02-12 NOTE — TOC Progression Note (Signed)
 Transition of Care (TOC) - Progression Note    Patient Details  Name: Christopher Bryan. MRN: [id number] Date of Birth: [birth year]  Transition [facility] Care [facility]) CM/SW Contact  Alfonso Rummer, LCSW Phone Number: 02/12/2024, 1:11 PM  Clinical Narrative:     KEN DELENA Rummer spk with [employer] social worker M. Lewis. Ms. Ezzard reports pt is 10% connected. Insurance must go thru pt secondary insurance. Pt choose [employer] and ins auth started.  Expected Discharge Plan: Skilled Nursing Facility Barriers to Discharge: Continued Medical Work up               Expected Discharge Plan and Services       Living arrangements for the past 2 months: Boarding House Expected Discharge Date: 02/12/24                                     Social Drivers of Health (SDOH) Interventions SDOH Screenings   Food Insecurity: No Food Insecurity (02/09/2024)  Housing: Low Risk (02/09/2024)  Transportation Needs: No Transportation Needs (02/09/2024)  Utilities: Not At Risk (02/09/2024)  Financial Resource Strain: Low Risk (03/29/2023)  Social Connections: Unknown (02/09/2024)  Tobacco Use: High Risk (02/09/2024)    Readmission Risk Interventions    03/29/2023   12:29 PM 03/02/2023   10:50 AM  Readmission Risk Prevention Plan  Transportation Screening Complete Complete  Medication Review (RN Care Manager) Complete Complete  PCP or Specialist appointment within 3-5 days of discharge Complete Complete  SW Recovery Care/Counseling Consult Complete Complete  Palliative Care Screening Not Applicable Not Applicable  Skilled Nursing Facility Not Applicable Not Applicable

## 2024-02-12 NOTE — Plan of Care (Signed)

## 2024-02-12 NOTE — TOC Transition Note (Signed)
 Transition of Care [facility]) - Discharge Note   Patient Details  Name: Christopher Bryan. MRN: [id number] Date of Birth: Feb 27, 1960  Transition of Care [facility]) CM/SW Contact:  Alfonso Rummer, LCSW Phone Number: 02/12/2024, 2:24 PM   Clinical Narrative:     Pt will transition to [facility] via [facility]. RN instructed to call report, pt will go to room 611a. No further toc needs.   Final next level of care: Skilled Nursing Facility Barriers to Discharge: Continued Medical Work up   Patient Goals and CMS Choice            Discharge Placement                       Discharge Plan and Services Additional resources added to the After Visit Summary for                                       Social Drivers of Health (SDOH) Interventions SDOH Screenings   Food Insecurity: No Food Insecurity (02/09/2024)  Housing: Low Risk (02/09/2024)  Transportation Needs: No Transportation Needs (02/09/2024)  Utilities: Not At Risk (02/09/2024)  Financial Resource Strain: Low Risk (03/29/2023)  Social Connections: Unknown (02/09/2024)  Tobacco Use: High Risk (02/09/2024)     Readmission Risk Interventions    03/29/2023   12:29 PM 03/02/2023   10:50 AM  Readmission Risk Prevention Plan  Transportation Screening Complete Complete  Medication Review [employer]) Complete Complete  PCP or Specialist appointment within 3-5 days of discharge Complete Complete  SW Recovery Care/Counseling Consult Complete Complete  Palliative Care Screening Not Applicable Not Applicable  Skilled Nursing Facility Not Applicable Not Applicable

## 2024-02-12 NOTE — Progress Notes (Signed)
 Occupational Therapy Treatment Patient Details Name: Christopher Bryan. MRN: [id number] DOB: 1960/03/09 Today's Date: 02/12/2024   History of present illness Pt is a 64 year old male admitted with AKI, acute left leg ischemia, now s/p angiography with angioplasty and stent placement x 2 on 1/9.    PMH significant for  PAD, PVD status post multiple prior vascular inventions, DVT, chronic anticoagulation   OT comments  Chart reviewed to date, pt greeted semi supine in bed, agreeable to OT tx session targeting improving functional activity tolerance in prep for ADL tasks. Pt is making progress towards goals however continues to require increased physical assist compared to baseline for functional mobility/ADLS. Please see further details below. VSS on RA throughout. Pt is left sitting on edge of bed, all needs met. OT will follow.       If plan is discharge home, recommend the following:  A little help with walking and/or transfers;A little help with bathing/dressing/bathroom;Assistance with cooking/housework;Assist for transportation;Help with stairs or ramp for entrance   Equipment Recommendations  BSC/3in1    Recommendations for Other Services      Precautions / Restrictions Precautions Precautions: Fall Recall of Precautions/Restrictions: Intact Restrictions Weight Bearing Restrictions Per Provider Order: No       Mobility Bed Mobility Overal bed mobility: Needs Assistance Bed Mobility: Supine to Sit     Supine to sit: Modified independent (Device/Increase time)          Transfers Overall transfer level: Needs assistance Equipment used: Rolling walker (2 wheels) Transfers: Sit to/from Stand Sit to Stand: Contact guard assist, Min assist                 Balance Overall balance assessment: Needs assistance Sitting-balance support: Feet supported Sitting balance-Leahy Scale: Good     Standing balance support: Bilateral upper extremity supported, During  functional activity, Reliant on assistive device for balance Standing balance-Leahy Scale: Fair                             ADL either performed or assessed with clinical judgement   ADL Overall ADL's : Needs assistance/impaired Eating/Feeding: Set up   Grooming: Contact guard assist;Standing Grooming Details (indicate cue type and reason): with RW at sink level             Lower Body Dressing: Minimal assistance   Toilet Transfer: Minimal assistance;Ambulation;Rolling walker (2 wheels);Regular Toilet   Toileting- Clothing Manipulation and Hygiene: Supervision/safety;Sitting/lateral lean       Functional mobility during ADLs: Rolling walker (2 wheels);Minimal assistance (approx 50', one standing rest break)      Extremity/Trunk Assessment              Vision       Perception     Praxis     Communication Communication Communication: No apparent difficulties   Cognition Arousal: Alert Behavior During Therapy: WFL for tasks assessed/performed Cognition: No apparent impairments                               Following commands: Intact        Cueing   Cueing Techniques: Verbal cues  Exercises Other Exercises Other Exercises: edu re role of OT, role of rehab    Shoulder Instructions       General Comments vss on RA    Pertinent Vitals/ Pain       Pain  Assessment Pain Assessment: No/denies pain Pain Score: 10-Worst pain ever Pain Location: LLE Pain Descriptors / Indicators: Burning Pain Intervention(s): Monitored during session, Limited activity within patient's tolerance, Repositioned, Premedicated before session  Home Living                                          Prior Functioning/Environment              Frequency  Min 2X/week        Progress Toward Goals  OT Goals(current goals can now be found in the care plan section)  Progress towards OT goals: Progressing toward goals  Acute  Rehab OT Goals Time For Goal Achievement: 02/24/24  Plan      Co-evaluation                 AM-PAC OT 6 Clicks Daily Activity     Outcome Measure   Help from another person eating meals?: None Help from another person taking care of personal grooming?: None Help from another person toileting, which includes using toliet, bedpan, or urinal?: None Help from another person bathing (including washing, rinsing, drying)?: A Little Help from another person to put on and taking off regular upper body clothing?: None Help from another person to put on and taking off regular lower body clothing?: A Little 6 Click Score: 22    End of Session Equipment Utilized During Treatment: Rolling walker (2 wheels)  OT Visit Diagnosis: Unsteadiness on feet (R26.81);Muscle weakness (generalized) (M62.81);Pain Pain - Right/Left: Left Pain - part of body: Leg   Activity Tolerance Patient tolerated treatment well   Patient Left with call bell/phone within reach;with bed alarm set (sitting on edge of bed)   Nurse Communication Mobility status        Time: 8866-8848 OT Time Calculation (min): 18 min  Charges: OT General Charges $OT Visit: 1 Visit OT Treatments $Therapeutic Activity: 8-22 mins  Therisa Sheffield, OTD OTR/L  02/12/2024, 12:52 PM

## 2024-02-12 NOTE — Progress Notes (Signed)
 RN unable to call report to [facility].  5 attempts throughout afternoon.  First 2 attempts, secretary answered but no nurse answered to give report.  Last 3 attempts, no answer what so ever.  AVS placed in discharge packet for staff to receive once patient arrives at [facility].

## 2024-02-12 NOTE — Discharge Summary (Signed)
 Physician Discharge Summary  Christopher Bryan. FMW:[id number] DOB: 07-Jan-1961 DOA: 02/09/2024  PCP: [facility]  Admit date: 02/09/2024 Discharge date: 02/12/2024  Admitted From: Home Disposition:  SNF  Recommendations for Outpatient Follow-up:  Follow up with PCP in 1-2 weeks   Home Health:No  Equipment/Devices:None   Discharge Condition:Stable  CODE STATUS:FULL  Diet recommendation: Heart  Brief/Interim Summary:   64 y.o. male with medical history significant of PAD, PVD status post multiple prior vascular inventions, DVT, chronic anticoagulation who presents for intermittent claudication symptoms that have markedly increased in severity in the left leg over the past 8 hours prior to admission.  Patient also endorsed coolness and numbness of left lower extremity.  Constant aching pain.  He has been prescribed Eliquis  and Plavix  however he admits to missing several doses.  Patient had a similar episode in September 2025 and underwent angiography which revealed a occluded femoral-femoral bypass graft subsequently treated with tPA and mechanical thrombectomy.   Patient also current smoker and history of COPD.  On presentation vascular was urgently consulted who recommended heparin  gtt. and Cath Lab for angiography and thrombectomy potentially progressing to open surgical repair and limb saving effort.      Discharge Diagnoses:  Principal Problem:   Critical limb ischemia of left lower extremity (HCC) Active Problems:   Acute lower limb ischemia  Acute left leg ischemia Symptoms started happening a week ago, acutely worsened on the night prior to presentation.  Reports intermittent compliance with Eliquis  or Plavix  and not taking over the last several days.  Vascular evaluated and noted his femorofemoral bypass graft to likely be occluded given lack of palpable pulses on the affected extremity. Underwent angiography with angioplasty and stent placement x 2 on 1/9.   Tolerated procedure well. Plan:  Continue oral Eliquis  Continue aspirin  and statin Therapy following Current recommendation for skilled nursing facility [facility] made aware Bed excepted [facility]   AKI Likely prerenal azotemia Now resolved, creatinine at baseline Plan: DC IVF   Severe PAD PVD On p.o. Eliquis  as above Continue aspirin  and statin Therapy evaluations Skilled nursing facility referral   COPD Not exacerbated Continue home regimen   BPH Continue Flomax    Hypertension Chronic diastolic congestive heart failure Continue Coreg  Resume Entresto  and Aldactone    Hyperlipidemia Continue statin     Discharge Instructions  Discharge Instructions     Ambulatory Referral for Lung Cancer Scre   Complete by: As directed    Increase activity slowly   Complete by: As directed       Allergies as of 02/12/2024       Reactions   Iodine  Rash   Ivp Dye [iodinated Contrast Media] Hives   Mushroom Other (See Comments)   Latex Hives, Itching   Iodinated Contrast Media Hives, Rash   Other Itching, Rash   Turkey Meat allergy per patient        Medication List     STOP taking these medications    clopidogrel  75 MG tablet Commonly known as: PLAVIX    lidocaine  5 % Commonly known as: LIDODERM    nicotine  polacrilex 4 MG lozenge Commonly known as: COMMIT       TAKE these medications    acetaminophen  325 MG tablet Commonly known as: TYLENOL  Take 650 mg by mouth every 6 (six) hours as needed.   albuterol  108 (90 Base) MCG/ACT inhaler Commonly known as: VENTOLIN  HFA Inhale 2 puffs into the lungs every 6 (six) hours as needed for wheezing or shortness  of breath.   apixaban  5 MG Tabs tablet Commonly known as: ELIQUIS  Take 1 tablet (5 mg total) by mouth 2 (two) times daily.   atorvastatin  40 MG tablet Commonly known as: LIPITOR  Take 20 mg by mouth at bedtime.   carboxymethylcellulose 0.5 % Soln Commonly known as: REFRESH PLUS Place 1 drop into  both eyes 4 (four) times daily as needed.   carvedilol  12.5 MG tablet Commonly known as: COREG  Take 1 tablet (12.5 mg total) by mouth 2 (two) times daily.   cholecalciferol  1000 units tablet Commonly known as: VITAMIN D  Take 2,000 Units by mouth daily.   diclofenac  Sodium 1 % Gel Commonly known as: VOLTAREN  Apply 2 g topically 3 (three) times daily as needed.   Entresto  24-26 MG Generic drug: sacubitril -valsartan  Take 1 tablet by mouth 2 (two) times daily.   fluticasone  50 MCG/ACT nasal spray Commonly known as: FLONASE  Place 1 spray into both nostrils daily as needed.   fluticasone -salmeterol 250-50 MCG/ACT Aepb Commonly known as: ADVAIR Inhale 1 puff into the lungs in the morning and at bedtime.   furosemide  20 MG tablet Commonly known as: LASIX  Take 1 tablet (20 mg total) by mouth daily as needed for edema (weight gain of 3 lbs in a day or 5 lbs in a week).   gabapentin  300 MG capsule Commonly known as: NEURONTIN  Take 600 mg by mouth 3 (three) times daily. TAKE TWO CAPSULES BY MOUTH THREE TIMES A DAY *START WITH 1 CAPSULE THREE TIMES A DAY AND INCREASE BY 1 CAPSULE EVERY 3 DAYS UNTIL TAKING TWO CAPSULES THREE TIMES A DAY   Jardiance  10 MG Tabs tablet Generic drug: empagliflozin  Take 1 tablet (10 mg total) by mouth daily before breakfast.   loratadine  10 MG tablet Commonly known as: CLARITIN  Take 10 mg by mouth daily as needed.   melatonin 3 MG Tabs tablet Take 3 mg by mouth at bedtime.   methocarbamol  500 MG tablet Commonly known as: ROBAXIN  Take 500 mg by mouth every 8 (eight) hours as needed for muscle spasms.   mirtazapine  7.5 MG tablet Commonly known as: REMERON  Take 7.5 mg by mouth at bedtime.   oxyCODONE  5 MG immediate release tablet Commonly known as: Oxy IR/ROXICODONE  Take 1 tablet (5 mg total) by mouth every 4 (four) hours as needed for severe pain (pain score 7-10). SNF use only.  Refills per SNF MD   pantoprazole  20 MG tablet Commonly known as:  PROTONIX  Take 20 mg by mouth daily.   polyethylene glycol powder 17 GM/SCOOP powder Commonly known as: GLYCOLAX /MIRALAX  Take 17 g by mouth daily as needed for moderate constipation.   sennosides-docusate sodium  8.6-50 MG tablet Commonly known as: SENOKOT-S Take 1-2 tablets by mouth at bedtime as needed for constipation.   sildenafil  100 MG tablet Commonly known as: VIAGRA  Take 100 mg by mouth daily as needed for erectile dysfunction.   spironolactone  25 MG tablet Commonly known as: ALDACTONE  Take 0.5 tablets (12.5 mg total) by mouth daily.   tamsulosin  0.4 MG Caps capsule Commonly known as: FLOMAX  Take 0.4 mg by mouth daily after supper.   tiotropium 18 MCG inhalation capsule Commonly known as: SPIRIVA  Place 18 mcg into inhaler and inhale daily.        Contact information for after-discharge care     Destination     [facility] .   Service: Skilled Nursing Contact information: [address] [place]  [phone]  Allergies[1]  Consultations: Vascular surgery   Procedures/Studies: DG Chest Port 1 View Result Date: 02/11/2024 EXAM: 1 VIEW(S) XRAY OF THE CHEST 02/11/2024 09:18:00 AM COMPARISON: 08/04/2023 CLINICAL HISTORY: 200808 Hypoxia 200808 FINDINGS: LUNGS AND PLEURA: Hyperinflation. No focal airspace consolidation or effusion. No pneumothorax. HEART AND MEDIASTINUM: Atherosclerotic calcifications. No acute abnormality of the cardiac and mediastinal silhouettes. BONES AND SOFT TISSUES: No acute osseous abnormality. IMPRESSION: 1. No acute cardiopulmonary abnormality. 2. Atherosclerotic calcifications. Electronically signed by: Toribio Agreste MD MD 02/11/2024 01:15 PM EST RP Workstation: [id number]   PERIPHERAL VASCULAR CATHETERIZATION Result Date: 02/09/2024 See surgical note for result.  US  Venous Img Lower Unilateral Left Result Date:  02/09/2024 EXAM: ULTRASOUND DUPLEX OF THE LEFT LOWER EXTREMITY VEINS TECHNIQUE: Duplex ultrasound using B-mode/gray scaled imaging and Doppler spectral analysis and color flow was obtained of the deep venous structures of the left lower extremity. COMPARISON: None available. CLINICAL HISTORY: Cool/numb, hx of DVT, takes Eliquis . FINDINGS: The common femoral vein, femoral vein, popliteal vein, and posterior tibial vein demonstrate normal compressibility with normal color flow and spectral analysis. Filling defect throughout the greater saphenous vein with noncompressibility compatible with superficial thrombophlebitis. IMPRESSION: 1. No evidence of DVT. 2. Superficial thrombophlebitis of the greater saphenous vein. Electronically signed by: Franky Crease MD MD 02/09/2024 03:34 AM EST RP Workstation: [id number]      Subjective: Seen and examined on the day of discharge.  Stable no distress.  Appropriate discharge skilled nursing facility  Discharge Exam: Vitals:   02/12/24 0431 02/12/24 0957  BP: (!) 122/57 (!) 119/43  Pulse: 73 73  Resp: 16 18  Temp: 98 F (36.7 C) 99.2 F (37.3 C)  SpO2: 95% 93%   Vitals:   02/11/24 2028 02/11/24 2141 02/12/24 0431 02/12/24 0957  BP: 133/64  (!) 122/57 (!) 119/43  Pulse: 84  73 73  Resp: 17  16 18   Temp: (!) 100.5 F (38.1 C) 98.1 F (36.7 C) 98 F (36.7 C) 99.2 F (37.3 C)  TempSrc: Oral Oral    SpO2: 100%  95% 93%  Weight:      Height:        General: Pt is alert, awake, not in acute distress Cardiovascular: RRR, S1/S2 +, no rubs, no gallops Respiratory: CTA bilaterally, no wheezing, no rhonchi Abdominal: Soft, NT, ND, bowel sounds + Extremities: no edema, no cyanosis    The results of significant diagnostics from this hospitalization (including imaging, microbiology, ancillary and laboratory) are listed below for reference.     Microbiology: No results found for this or any previous visit (from the past 240 hours).   Labs: BNP (last  3 results) Recent Labs    02/28/23 1900 03/28/23 0014  BNP 488.6* 507.8*   Basic Metabolic Panel: Recent Labs  Lab 02/09/24 0139 02/10/24 0640 02/11/24 0624  NA 139 137 139  K 4.7 4.2 4.4  CL 102 107 111  CO2 25 24 23   GLUCOSE 107* 105* 88  BUN 40* 45* 25*  CREATININE 2.05* 1.36* 0.94  CALCIUM  9.1 8.4* 8.4*   Liver Function Tests: No results for input(s): AST, ALT, ALKPHOS, BILITOT, PROT, ALBUMIN in the last 168 hours. No results for input(s): LIPASE, AMYLASE in the last 168 hours. No results for input(s): AMMONIA in the last 168 hours. CBC: Recent Labs  Lab 02/09/24 0139 02/10/24 0640 02/11/24 0624  WBC 4.7 5.1 4.8  NEUTROABS 2.4  --  2.2  HGB 13.4 11.2* 9.9*  HCT 41.8 32.2* 30.4*  MCV 90.3 91.2 89.9  PLT  197 160 122*   Cardiac Enzymes: No results for input(s): CKTOTAL, CKMB, CKMBINDEX, TROPONINI in the last 168 hours. BNP: Invalid input(s): POCBNP CBG: No results for input(s): GLUCAP in the last 168 hours. D-Dimer No results for input(s): DDIMER in the last 72 hours. Hgb A1c No results for input(s): HGBA1C in the last 72 hours. Lipid Profile No results for input(s): CHOL, HDL, LDLCALC, TRIG, CHOLHDL, LDLDIRECT in the last 72 hours. Thyroid function studies No results for input(s): TSH, T4TOTAL, T3FREE, THYROIDAB in the last 72 hours.  Invalid input(s): FREET3 Anemia work up No results for input(s): VITAMINB12, FOLATE, FERRITIN, TIBC, IRON, RETICCTPCT in the last 72 hours. Urinalysis    Component Value Date/Time   COLORURINE YELLOW (A) 01/26/2023 0010   APPEARANCEUR HAZY (A) 01/26/2023 0010   LABSPEC 1.014 01/26/2023 0010   PHURINE 5.0 01/26/2023 0010   GLUCOSEU NEGATIVE 01/26/2023 0010   HGBUR NEGATIVE 01/26/2023 0010   BILIRUBINUR NEGATIVE 01/26/2023 0010   KETONESUR NEGATIVE 01/26/2023 0010   PROTEINUR 100 (A) 01/26/2023 0010   NITRITE NEGATIVE 01/26/2023 0010   LEUKOCYTESUR  NEGATIVE 01/26/2023 0010   Sepsis Labs Recent Labs  Lab 02/09/24 0139 02/10/24 0640 02/11/24 0624  WBC 4.7 5.1 4.8   Microbiology No results found for this or any previous visit (from the past 240 hours).   Time coordinating discharge: 40 minutes  SIGNED:   Calvin KATHEE Robson, MD  [facility] 02/12/2024, 1:54 PM Pager   If 7PM-7AM, please contact night-coverage     [1]  Allergies Allergen Reactions   Iodine  Rash   Ivp Dye [Iodinated Contrast Media] Hives   Mushroom Other (See Comments)   Latex Hives and Itching   Iodinated Contrast Media Hives and Rash   Other Itching and Rash    Turkey Meat allergy per patient

## 2024-02-12 NOTE — Progress Notes (Signed)
 " PROGRESS NOTE    Christopher Bryan.  FMW:[id number] DOB: Apr 25, 1960 DOA: 02/09/2024 [facility]CP: [facility]    Brief Narrative:   64 y.o. male with medical history significant of PAD, PVD status post multiple prior vascular inventions, DVT, chronic anticoagulation who presents for intermittent claudication symptoms that have markedly increased in severity in the left leg over the past 8 hours prior to admission.  Patient also endorsed coolness and numbness of left lower extremity.  Constant aching pain.  He has been prescribed Eliquis  and Plavix  however he admits to missing several doses.  Patient had a similar episode in September 2025 and underwent angiography which revealed a occluded femoral-femoral bypass graft subsequently treated with tPA and mechanical thrombectomy.   Patient also current smoker and history of COPD.  On presentation vascular was urgently consulted who recommended heparin  gtt. and Cath Lab for angiography and thrombectomy potentially progressing to open surgical repair and limb saving effort.   Assessment & Plan:   Principal Problem:   Critical limb ischemia of left lower extremity (HCC) Active Problems:   Acute lower limb ischemia  Acute left leg ischemia Symptoms started happening a week ago, acutely worsened on the night prior to presentation.  Reports intermittent compliance with Eliquis  or Plavix  and not taking over the last several days.  Vascular evaluated and noted his femorofemoral bypass graft to likely be occluded given lack of palpable pulses on the affected extremity. Underwent angiography with angioplasty and stent placement x 2 on 1/9.  Tolerated procedure well. Plan:  Continue oral Eliquis  Continue aspirin  and statin Therapy following Current recommendation for skilled nursing facility, patient agreement TOC made aware Patient is medically ready for discharge  AKI Likely prerenal azotemia Now resolved, creatinine at baseline Plan: No  need for IVF   Severe PAD PVD On p.o. Eliquis  as above Continue aspirin  and statin Therapy evaluations Skilled nursing facility referral Medically stable for discharge   COPD Not exacerbated Continue home regimen   BPH Continue Flomax    Hypertension Continue Coreg    Hyperlipidemia Continue statin      DVT prophylaxis: Eliquis  Code Status: Full Family Communication: None Disposition Plan: Status is: Inpatient Remains inpatient appropriate because: Acute left leg ischemia.  Status post angiography and intervention.  Current recommendation for skilled nursing facility.  Bed search in progress.  Otherwise medically stable.   Level of care: Telemetry  Consultants:  Vascular surgery  Procedures:  Angiography with angioplasty and stent placement  Antimicrobials: None   Subjective: Seen and examined.  No acute events overnight.  Has some residual left leg pain controlled with oral medications.  Objective: Vitals:   02/11/24 2028 02/11/24 2141 02/12/24 0431 02/12/24 0957  BP: 133/64  (!) 122/57 (!) 119/43  Pulse: 84  73 73  Resp: 17  16 18   Temp: (!) 100.5 F (38.1 C) 98.1 F (36.7 C) 98 F (36.7 C) 99.2 F (37.3 C)  TempSrc: Oral Oral    SpO2: 100%  95% 93%  Weight:      Height:        Intake/Output Summary (Last 24 hours) at 02/12/2024 1215 Last data filed at 02/12/2024 0900 Gross per 24 hour  Intake 480 ml  Output 2100 ml  Net -1620 ml   Filed Weights   02/09/24 0138  Weight: 54.4 kg    Examination:  General exam: No acute distress Respiratory system: Clear.  Normal work of breathing.  Room air Cardiovascular system: S1-S2, RRR, no murmurs, no pedal edema  Gastrointestinal system: Thin, soft, NT/ND, normal bowel sounds Central nervous system: Alert and oriented. No focal neurological deficits. Extremities: Palpable pedal pulses bilaterally.  Gait not assessed Skin: No rashes, lesions or ulcers Psychiatry: Judgement and insight appear  normal. Mood & affect appropriate.     Data Reviewed: I have personally reviewed following labs and imaging studies  CBC: Recent Labs  Lab 02/09/24 0139 02/10/24 0640 02/11/24 0624  WBC 4.7 5.1 4.8  NEUTROABS 2.4  --  2.2  HGB 13.4 11.2* 9.9*  HCT 41.8 32.2* 30.4*  MCV 90.3 91.2 89.9  PLT 197 160 122*   Basic Metabolic Panel: Recent Labs  Lab 02/09/24 0139 02/10/24 0640 02/11/24 0624  NA 139 137 139  K 4.7 4.2 4.4  CL 102 107 111  CO2 25 24 23   GLUCOSE 107* 105* 88  BUN 40* 45* 25*  CREATININE 2.05* 1.36* 0.94  CALCIUM  9.1 8.4* 8.4*   GFR: Estimated Creatinine Clearance: 60.3 mL/min (by C-G formula based on SCr of 0.94 mg/dL). Liver Function Tests: No results for input(s): AST, ALT, ALKPHOS, BILITOT, PROT, ALBUMIN in the last 168 hours. No results for input(s): LIPASE, AMYLASE in the last 168 hours. No results for input(s): AMMONIA in the last 168 hours. Coagulation Profile: Recent Labs  Lab 02/09/24 0139  INR 1.0   Cardiac Enzymes: No results for input(s): CKTOTAL, CKMB, CKMBINDEX, TROPONINI in the last 168 hours. BNP (last 3 results) No results for input(s): PROBNP in the last 8760 hours. HbA1C: No results for input(s): HGBA1C in the last 72 hours. CBG: No results for input(s): GLUCAP in the last 168 hours. Lipid Profile: No results for input(s): CHOL, HDL, LDLCALC, TRIG, CHOLHDL, LDLDIRECT in the last 72 hours. Thyroid Function Tests: No results for input(s): TSH, T4TOTAL, FREET4, T3FREE, THYROIDAB in the last 72 hours. Anemia Panel: No results for input(s): VITAMINB12, FOLATE, FERRITIN, TIBC, IRON, RETICCTPCT in the last 72 hours. Sepsis Labs: No results for input(s): PROCALCITON, LATICACIDVEN in the last 168 hours.  No results found for this or any previous visit (from the past 240 hours).       Radiology Studies: DG Chest Port 1 View Result Date: 02/11/2024 EXAM: 1  VIEW(S) XRAY OF THE CHEST 02/11/2024 09:18:00 AM COMPARISON: 08/04/2023 CLINICAL HISTORY: 200808 Hypoxia 200808 FINDINGS: LUNGS AND PLEURA: Hyperinflation. No focal airspace consolidation or effusion. No pneumothorax. HEART AND MEDIASTINUM: Atherosclerotic calcifications. No acute abnormality of the cardiac and mediastinal silhouettes. BONES AND SOFT TISSUES: No acute osseous abnormality. IMPRESSION: 1. No acute cardiopulmonary abnormality. 2. Atherosclerotic calcifications. Electronically signed by: Toribio Agreste MD MD 02/11/2024 01:15 PM EST RP Workstation: [id number]         Scheduled Meds:  apixaban   5 mg Oral BID   atorvastatin   20 mg Oral QHS   carvedilol   12.5 mg Oral BID   cholecalciferol   2,000 Units Oral Daily   famotidine   20 mg Oral BID   fluticasone  furoate-vilanterol  1 puff Inhalation Daily   gabapentin   600 mg Oral TID   melatonin  5 mg Oral QHS   mirtazapine   7.5 mg Oral QHS   pantoprazole   20 mg Oral Daily   tamsulosin   0.4 mg Oral QPC supper   umeclidinium bromide   1 puff Inhalation Daily   Continuous Infusions:     LOS: 3 days     Calvin KATHEE Robson, MD [facility]   If 7PM-7AM, please contact night-coverage  02/12/2024, 12:15 PM   "

## 2024-03-02 DIAGNOSIS — J9601 Acute respiratory failure with hypoxia: Secondary | ICD-10-CM | POA: Diagnosis not present

## 2024-03-02 DIAGNOSIS — J439 Emphysema, unspecified: Secondary | ICD-10-CM | POA: Diagnosis present

## 2024-03-02 DIAGNOSIS — J9622 Acute and chronic respiratory failure with hypercapnia: Secondary | ICD-10-CM | POA: Diagnosis present

## 2024-03-02 DIAGNOSIS — R739 Hyperglycemia, unspecified: Secondary | ICD-10-CM | POA: Diagnosis present

## 2024-03-02 DIAGNOSIS — F1090 Alcohol use, unspecified, uncomplicated: Secondary | ICD-10-CM | POA: Diagnosis not present

## 2024-03-02 DIAGNOSIS — R252 Cramp and spasm: Secondary | ICD-10-CM | POA: Diagnosis not present

## 2024-03-02 DIAGNOSIS — T380X5A Adverse effect of glucocorticoids and synthetic analogues, initial encounter: Secondary | ICD-10-CM | POA: Diagnosis present

## 2024-03-02 DIAGNOSIS — I4891 Unspecified atrial fibrillation: Secondary | ICD-10-CM | POA: Diagnosis present

## 2024-03-02 DIAGNOSIS — I2782 Chronic pulmonary embolism: Secondary | ICD-10-CM | POA: Diagnosis present

## 2024-03-02 DIAGNOSIS — Z8674 Personal history of sudden cardiac arrest: Secondary | ICD-10-CM

## 2024-03-02 DIAGNOSIS — G894 Chronic pain syndrome: Secondary | ICD-10-CM

## 2024-03-02 DIAGNOSIS — I428 Other cardiomyopathies: Secondary | ICD-10-CM | POA: Diagnosis present

## 2024-03-02 DIAGNOSIS — I2699 Other pulmonary embolism without acute cor pulmonale: Secondary | ICD-10-CM | POA: Diagnosis present

## 2024-03-02 DIAGNOSIS — Z86718 Personal history of other venous thrombosis and embolism: Secondary | ICD-10-CM

## 2024-03-02 DIAGNOSIS — J9602 Acute respiratory failure with hypercapnia: Secondary | ICD-10-CM | POA: Diagnosis not present

## 2024-03-02 DIAGNOSIS — G629 Polyneuropathy, unspecified: Secondary | ICD-10-CM | POA: Diagnosis present

## 2024-03-02 DIAGNOSIS — F1490 Cocaine use, unspecified, uncomplicated: Secondary | ICD-10-CM | POA: Diagnosis not present

## 2024-03-02 DIAGNOSIS — I160 Hypertensive urgency: Secondary | ICD-10-CM | POA: Diagnosis present

## 2024-03-02 DIAGNOSIS — M1990 Unspecified osteoarthritis, unspecified site: Secondary | ICD-10-CM | POA: Diagnosis present

## 2024-03-02 DIAGNOSIS — I5023 Acute on chronic systolic (congestive) heart failure: Secondary | ICD-10-CM | POA: Diagnosis not present

## 2024-03-02 DIAGNOSIS — Z7901 Long term (current) use of anticoagulants: Secondary | ICD-10-CM

## 2024-03-02 DIAGNOSIS — G8929 Other chronic pain: Secondary | ICD-10-CM | POA: Diagnosis present

## 2024-03-02 DIAGNOSIS — Z87891 Personal history of nicotine dependence: Secondary | ICD-10-CM

## 2024-03-02 DIAGNOSIS — J441 Chronic obstructive pulmonary disease with (acute) exacerbation: Secondary | ICD-10-CM | POA: Diagnosis present

## 2024-03-02 DIAGNOSIS — Z1152 Encounter for screening for COVID-19: Secondary | ICD-10-CM

## 2024-03-02 DIAGNOSIS — Z720 Tobacco use: Secondary | ICD-10-CM | POA: Diagnosis not present

## 2024-03-02 DIAGNOSIS — F1410 Cocaine abuse, uncomplicated: Secondary | ICD-10-CM | POA: Diagnosis present

## 2024-03-02 DIAGNOSIS — I10 Essential (primary) hypertension: Secondary | ICD-10-CM | POA: Insufficient documentation

## 2024-03-02 DIAGNOSIS — J9621 Acute and chronic respiratory failure with hypoxia: Principal | ICD-10-CM | POA: Diagnosis present

## 2024-03-02 DIAGNOSIS — I110 Hypertensive heart disease with heart failure: Secondary | ICD-10-CM | POA: Diagnosis present

## 2024-03-02 DIAGNOSIS — Z91148 Patient's other noncompliance with medication regimen for other reason: Secondary | ICD-10-CM

## 2024-03-02 MED ADMIN — Methylprednisolone Sod Succ For Inj 40 MG (Base Equiv): 40 mg | INTRAVENOUS | NDC 00009003933

## 2024-03-02 MED ADMIN — Albuterol Sulfate Soln Nebu 0.083% (2.5 MG/3ML): 5 mg | RESPIRATORY_TRACT | NDC 00378827031

## 2024-03-02 MED ADMIN — Albuterol Sulfate Soln Nebu 0.083% (2.5 MG/3ML): 2.5 mg | RESPIRATORY_TRACT | NDC 00378827031

## 2024-03-02 MED ADMIN — Pantoprazole Sodium For IV Soln 40 MG (Base Equiv): 40 mg | INTRAVENOUS | NDC 00008092355

## 2024-03-02 MED ADMIN — CEFTRIAXONE  1 GM IVPB MIXTURE: 1 g | INTRAVENOUS | NDC 60505614804

## 2024-03-02 MED ADMIN — Budesonide Inhalation Susp 0.25 MG/2ML: 0.25 mg | RESPIRATORY_TRACT | NDC 00487960101

## 2024-03-02 MED ADMIN — Ipratropium-Albuterol Nebu Soln 0.5-2.5(3) MG/3ML: 3 mL | RESPIRATORY_TRACT | NDC 60687040579

## 2024-03-02 MED ADMIN — Diphenhydramine HCl Inj 50 MG/ML: 50 mg | INTRAVENOUS | NDC 00641037621

## 2024-03-03 DIAGNOSIS — J9601 Acute respiratory failure with hypoxia: Secondary | ICD-10-CM | POA: Diagnosis not present

## 2024-03-03 DIAGNOSIS — J441 Chronic obstructive pulmonary disease with (acute) exacerbation: Secondary | ICD-10-CM

## 2024-03-03 DIAGNOSIS — J9602 Acute respiratory failure with hypercapnia: Secondary | ICD-10-CM | POA: Diagnosis not present

## 2024-03-03 DIAGNOSIS — I2699 Other pulmonary embolism without acute cor pulmonale: Secondary | ICD-10-CM

## 2024-03-03 DIAGNOSIS — F1090 Alcohol use, unspecified, uncomplicated: Secondary | ICD-10-CM | POA: Diagnosis not present

## 2024-03-03 DIAGNOSIS — I110 Hypertensive heart disease with heart failure: Secondary | ICD-10-CM

## 2024-03-03 DIAGNOSIS — I160 Hypertensive urgency: Secondary | ICD-10-CM

## 2024-03-03 DIAGNOSIS — Z720 Tobacco use: Secondary | ICD-10-CM | POA: Diagnosis not present

## 2024-03-03 DIAGNOSIS — F1490 Cocaine use, unspecified, uncomplicated: Secondary | ICD-10-CM | POA: Diagnosis not present

## 2024-03-03 DIAGNOSIS — I4891 Unspecified atrial fibrillation: Secondary | ICD-10-CM

## 2024-03-03 DIAGNOSIS — I5023 Acute on chronic systolic (congestive) heart failure: Secondary | ICD-10-CM | POA: Diagnosis not present

## 2024-03-03 DIAGNOSIS — R739 Hyperglycemia, unspecified: Secondary | ICD-10-CM | POA: Diagnosis not present

## 2024-03-03 MED ADMIN — Arformoterol Tartrate Soln Nebu 15 MCG/2ML (Base Equiv): 15 ug | RESPIRATORY_TRACT | NDC 70756061212

## 2024-03-03 MED ADMIN — Arformoterol Tartrate Soln Nebu 15 MCG/2ML (Base Equiv): 15 ug | RESPIRATORY_TRACT | NDC 70756061270

## 2024-03-03 MED ADMIN — Heparin Sod (Porcine) in NaCl IV Soln 25000 Unit/250ML-0.45%: 1100 [IU]/h | INTRAVENOUS | NDC 00409765062

## 2024-03-03 MED ADMIN — Apixaban Tab 5 MG: 10 mg | ORAL | NDC 00003089431

## 2024-03-03 MED ADMIN — Budesonide Inhalation Susp 0.5 MG/2ML: 0.5 mg | RESPIRATORY_TRACT | NDC 68180098430

## 2024-03-03 MED ADMIN — Acetaminophen Tab 325 MG: 650 mg | ORAL | NDC 00904677361

## 2024-03-03 MED ADMIN — Pantoprazole Sodium For IV Soln 40 MG (Base Equiv): 40 mg | INTRAVENOUS | NDC 00008092351

## 2024-03-03 MED ADMIN — Revefenacin Inhalation Solution 175 MCG/3ML: 175 ug | RESPIRATORY_TRACT | NDC 49502080632

## 2024-03-03 MED ADMIN — Oxycodone HCl Tab 5 MG: 2.5 mg | ORAL | NDC 00406055201

## 2024-03-03 MED ADMIN — Heparin Sodium (Porcine) Inj 5000 Unit/ML: 3800 [IU] | INTRAVENOUS

## 2024-03-03 MED ADMIN — Methylprednisolone Sod Succ For Inj 40 MG (Base Equiv): 40 mg | INTRAVENOUS | NDC 00009003933

## 2024-03-03 MED ADMIN — Azithromycin Tab 250 MG: 500 mg | ORAL | NDC 50111078855

## 2024-03-03 MED ADMIN — DOXYCYCLINE 100 MG IVPB: 100 mg | INTRAVENOUS | NDC 67457043700

## 2024-03-03 MED ADMIN — DOXYCYCLINE 100 MG IVPB: 100 mg | INTRAVENOUS | NDC 72266023701

## 2024-03-03 MED ADMIN — Pregabalin Cap 25 MG: 25 mg | ORAL | NDC 00904699161

## 2024-03-03 MED ADMIN — Iohexol IV Soln 350 MG/ML: 100 mL | INTRAVENOUS | NDC 00407141491

## 2024-03-04 DIAGNOSIS — G8929 Other chronic pain: Secondary | ICD-10-CM | POA: Insufficient documentation

## 2024-03-04 DIAGNOSIS — F1490 Cocaine use, unspecified, uncomplicated: Secondary | ICD-10-CM | POA: Insufficient documentation

## 2024-03-04 DIAGNOSIS — Z86718 Personal history of other venous thrombosis and embolism: Secondary | ICD-10-CM

## 2024-03-04 DIAGNOSIS — J9621 Acute and chronic respiratory failure with hypoxia: Principal | ICD-10-CM

## 2024-03-04 DIAGNOSIS — J9622 Acute and chronic respiratory failure with hypercapnia: Secondary | ICD-10-CM

## 2024-03-04 DIAGNOSIS — G629 Polyneuropathy, unspecified: Secondary | ICD-10-CM

## 2024-03-04 DIAGNOSIS — I10 Essential (primary) hypertension: Secondary | ICD-10-CM | POA: Insufficient documentation

## 2024-03-04 MED ADMIN — Arformoterol Tartrate Soln Nebu 15 MCG/2ML (Base Equiv): 15 ug | RESPIRATORY_TRACT | NDC 70756061270

## 2024-03-04 MED ADMIN — Arformoterol Tartrate Soln Nebu 15 MCG/2ML (Base Equiv): 15 ug | RESPIRATORY_TRACT | NDC 70756061212

## 2024-03-04 MED ADMIN — Apixaban Tab 5 MG: 10 mg | ORAL | NDC 00003089431

## 2024-03-04 MED ADMIN — Prednisone Tab 20 MG: 40 mg | ORAL | NDC 60219170801

## 2024-03-04 MED ADMIN — Budesonide Inhalation Susp 0.5 MG/2ML: 0.5 mg | RESPIRATORY_TRACT | NDC 68180098430

## 2024-03-04 MED ADMIN — Acetaminophen Tab 325 MG: 650 mg | ORAL | NDC 00904677361

## 2024-03-04 MED ADMIN — Pantoprazole Sodium For IV Soln 40 MG (Base Equiv): 40 mg | INTRAVENOUS | NDC 55150020200

## 2024-03-04 MED ADMIN — Revefenacin Inhalation Solution 175 MCG/3ML: 175 ug | RESPIRATORY_TRACT | NDC 49502080632

## 2024-03-04 MED ADMIN — Pregabalin Cap 25 MG: 25 mg | ORAL | NDC 00904699161

## 2024-03-04 MED ADMIN — Azithromycin Tab 250 MG: 500 mg | ORAL | NDC 50111078751

## 2024-03-05 DIAGNOSIS — J9622 Acute and chronic respiratory failure with hypercapnia: Secondary | ICD-10-CM | POA: Diagnosis not present

## 2024-03-05 DIAGNOSIS — J9621 Acute and chronic respiratory failure with hypoxia: Secondary | ICD-10-CM | POA: Diagnosis not present

## 2024-03-05 MED ADMIN — Arformoterol Tartrate Soln Nebu 15 MCG/2ML (Base Equiv): 15 ug | RESPIRATORY_TRACT | NDC 70756061212

## 2024-03-05 MED ADMIN — Apixaban Tab 5 MG: 10 mg | ORAL | NDC 00003089431

## 2024-03-05 MED ADMIN — Prednisone Tab 20 MG: 40 mg | ORAL | NDC 59651048801

## 2024-03-05 MED ADMIN — Budesonide Inhalation Susp 0.5 MG/2ML: 0.5 mg | RESPIRATORY_TRACT | NDC 68180098430

## 2024-03-05 MED ADMIN — Acetaminophen Tab 325 MG: 650 mg | ORAL | NDC 00904677361

## 2024-03-05 MED ADMIN — Revefenacin Inhalation Solution 175 MCG/3ML: 175 ug | RESPIRATORY_TRACT | NDC 49502080632

## 2024-03-05 MED ADMIN — Pregabalin Cap 25 MG: 25 mg | ORAL | NDC 00904699161

## 2024-03-05 MED ADMIN — Azithromycin Tab 250 MG: 500 mg | ORAL | NDC 50111078751

## 2024-03-06 ENCOUNTER — Other Ambulatory Visit (HOSPITAL_BASED_OUTPATIENT_CLINIC_OR_DEPARTMENT_OTHER): Payer: Self-pay

## 2024-03-06 LAB — URINE DRUGS OF ABUSE SCREEN W ALC, ROUTINE (REF LAB)
Amphetamines, Urine: NEGATIVE ng/mL
Barbiturate, Ur: NEGATIVE ng/mL
Benzodiazepine Quant, Ur: NEGATIVE ng/mL
Cannabinoid Quant, Ur: NEGATIVE ng/mL
Creatinine, Urine: 84.2 mg/dL (ref 20.0–300.0)
Ethanol U, Quan: NEGATIVE %
Methadone Screen, Urine: NEGATIVE ng/mL
Nitrite Urine, Quantitative: NEGATIVE ug/mL
OPIATE SCREEN URINE: NEGATIVE ng/mL
Phencyclidine, Ur: NEGATIVE ng/mL
Propoxyphene, Urine: NEGATIVE ng/mL
pH, Urine: 4.7 (ref 4.5–8.9)
